# Patient Record
Sex: Male | Born: 1955 | Hispanic: No | Marital: Married | Smoking: Never smoker
Health system: Southern US, Community
[De-identification: ages and names within clinical notes are randomized; demographics above are authoritative.]

## PROBLEM LIST (undated history)

## (undated) DIAGNOSIS — G8918 Other acute postprocedural pain: Secondary | ICD-10-CM

## (undated) DIAGNOSIS — R159 Full incontinence of feces: Secondary | ICD-10-CM

## (undated) DIAGNOSIS — I10 Essential (primary) hypertension: Secondary | ICD-10-CM

## (undated) DIAGNOSIS — D509 Iron deficiency anemia, unspecified: Secondary | ICD-10-CM

## (undated) DIAGNOSIS — E785 Hyperlipidemia, unspecified: Secondary | ICD-10-CM

## (undated) DIAGNOSIS — I4891 Unspecified atrial fibrillation: Secondary | ICD-10-CM

## (undated) DIAGNOSIS — H269 Unspecified cataract: Secondary | ICD-10-CM

## (undated) DIAGNOSIS — M5136 Other intervertebral disc degeneration, lumbar region: Secondary | ICD-10-CM

## (undated) DIAGNOSIS — C20 Malignant neoplasm of rectum: Secondary | ICD-10-CM

## (undated) DIAGNOSIS — Z85048 Personal history of other malignant neoplasm of rectum, rectosigmoid junction, and anus: Secondary | ICD-10-CM

## (undated) DIAGNOSIS — M51369 Other intervertebral disc degeneration, lumbar region without mention of lumbar back pain or lower extremity pain: Secondary | ICD-10-CM

## (undated) DIAGNOSIS — E119 Type 2 diabetes mellitus without complications: Secondary | ICD-10-CM

## (undated) DIAGNOSIS — N529 Male erectile dysfunction, unspecified: Secondary | ICD-10-CM

## (undated) DIAGNOSIS — I2510 Atherosclerotic heart disease of native coronary artery without angina pectoris: Secondary | ICD-10-CM

## (undated) DIAGNOSIS — M109 Gout, unspecified: Secondary | ICD-10-CM

## (undated) DIAGNOSIS — Z8619 Personal history of other infectious and parasitic diseases: Secondary | ICD-10-CM

## (undated) HISTORY — DX: Malignant neoplasm of rectum: C20

## (undated) HISTORY — DX: Gout, unspecified: M10.9

## (undated) HISTORY — PX: COLONOSCOPY: SHX174

## (undated) HISTORY — PX: CATARACT EXTRACTION W/ INTRAOCULAR LENS IMPLANT: SHX1309

## (undated) HISTORY — PX: HERNIA REPAIR: SHX51

## (undated) HISTORY — DX: Unspecified cataract: H26.9

## (undated) HISTORY — DX: Unspecified atrial fibrillation: I48.91

## (undated) HISTORY — PX: COLONOSCOPY WITH ESOPHAGOGASTRODUODENOSCOPY (EGD): SHX5779

## (undated) HISTORY — PX: OTHER SURGICAL HISTORY: SHX169

## (undated) HISTORY — PX: ABLATION: SHX5711

## (undated) HISTORY — DX: Hyperlipidemia, unspecified: E78.5

---

## 1998-07-03 ENCOUNTER — Ambulatory Visit (HOSPITAL_COMMUNITY): Admission: RE | Admit: 1998-07-03 | Discharge: 1998-07-03 | Payer: Self-pay | Admitting: Cardiovascular Disease

## 2000-10-16 ENCOUNTER — Ambulatory Visit (HOSPITAL_COMMUNITY): Admission: RE | Admit: 2000-10-16 | Discharge: 2000-10-16 | Payer: Self-pay | Admitting: Cardiology

## 2000-10-16 ENCOUNTER — Encounter: Payer: Self-pay | Admitting: Cardiology

## 2005-07-02 ENCOUNTER — Encounter: Admission: RE | Admit: 2005-07-02 | Discharge: 2005-07-02 | Payer: Self-pay | Admitting: *Deleted

## 2005-07-03 ENCOUNTER — Encounter (INDEPENDENT_AMBULATORY_CARE_PROVIDER_SITE_OTHER): Payer: Self-pay | Admitting: Specialist

## 2005-07-03 ENCOUNTER — Ambulatory Visit (HOSPITAL_COMMUNITY): Admission: RE | Admit: 2005-07-03 | Discharge: 2005-07-03 | Payer: Self-pay | Admitting: *Deleted

## 2005-07-03 ENCOUNTER — Ambulatory Visit: Payer: Self-pay | Admitting: Hematology and Oncology

## 2005-07-15 ENCOUNTER — Ambulatory Visit: Admission: RE | Admit: 2005-07-15 | Discharge: 2005-09-11 | Payer: Self-pay | Admitting: *Deleted

## 2005-07-17 ENCOUNTER — Encounter: Admission: RE | Admit: 2005-07-17 | Discharge: 2005-07-17 | Payer: Self-pay | Admitting: Hematology and Oncology

## 2005-07-23 ENCOUNTER — Ambulatory Visit (HOSPITAL_COMMUNITY): Admission: RE | Admit: 2005-07-23 | Discharge: 2005-07-23 | Payer: Self-pay | Admitting: Hematology and Oncology

## 2005-07-23 HISTORY — PX: OTHER SURGICAL HISTORY: SHX169

## 2005-08-18 ENCOUNTER — Ambulatory Visit: Payer: Self-pay | Admitting: Hematology and Oncology

## 2005-08-22 ENCOUNTER — Emergency Department (HOSPITAL_COMMUNITY): Admission: EM | Admit: 2005-08-22 | Discharge: 2005-08-22 | Payer: Self-pay | Admitting: Emergency Medicine

## 2005-12-05 ENCOUNTER — Encounter: Admission: RE | Admit: 2005-12-05 | Discharge: 2005-12-05 | Payer: Self-pay | Admitting: Internal Medicine

## 2005-12-09 ENCOUNTER — Ambulatory Visit: Payer: Self-pay | Admitting: Hematology and Oncology

## 2006-02-04 ENCOUNTER — Ambulatory Visit: Payer: Self-pay | Admitting: Hematology and Oncology

## 2006-03-25 ENCOUNTER — Ambulatory Visit: Payer: Self-pay | Admitting: Hematology and Oncology

## 2006-04-02 LAB — CBC WITH DIFFERENTIAL/PLATELET
BASO%: 1.2 % (ref 0.0–2.0)
Basophils Absolute: 0.1 10*3/uL (ref 0.0–0.1)
EOS%: 4.7 % (ref 0.0–7.0)
Eosinophils Absolute: 0.2 10*3/uL (ref 0.0–0.5)
HCT: 39.8 % (ref 38.7–49.9)
HGB: 13 g/dL (ref 13.0–17.1)
LYMPH%: 21.2 % (ref 14.0–48.0)
MCH: 26.1 pg — ABNORMAL LOW (ref 28.0–33.4)
MCHC: 32.5 g/dL (ref 32.0–35.9)
MCV: 80.1 fL — ABNORMAL LOW (ref 81.6–98.0)
MONO#: 0.5 10*3/uL (ref 0.1–0.9)
MONO%: 11.1 % (ref 0.0–13.0)
NEUT#: 2.7 10*3/uL (ref 1.5–6.5)
NEUT%: 61.9 % (ref 40.0–75.0)
Platelets: 261 10*3/uL (ref 145–400)
RBC: 4.97 10*6/uL (ref 4.20–5.71)
RDW: 17.8 % — ABNORMAL HIGH (ref 11.2–14.6)
WBC: 4.3 10*3/uL (ref 4.0–10.0)
lymph#: 0.9 10*3/uL (ref 0.9–3.3)

## 2006-04-02 LAB — BASIC METABOLIC PANEL
BUN: 13 mg/dL (ref 6–23)
CO2: 24 mEq/L (ref 19–32)
Calcium: 8.9 mg/dL (ref 8.4–10.5)
Chloride: 103 mEq/L (ref 96–112)
Creatinine, Ser: 0.8 mg/dL (ref 0.4–1.5)
Glucose, Bld: 186 mg/dL — ABNORMAL HIGH (ref 70–99)
Potassium: 4.5 mEq/L (ref 3.5–5.3)
Sodium: 136 mEq/L (ref 135–145)

## 2006-04-03 LAB — MAGNESIUM: Magnesium: 1.7 mg/dL (ref 1.5–2.5)

## 2006-04-16 LAB — BASIC METABOLIC PANEL
BUN: 10 mg/dL (ref 6–23)
CO2: 25 mEq/L (ref 19–32)
Calcium: 9.6 mg/dL (ref 8.4–10.5)
Chloride: 104 mEq/L (ref 96–112)
Creatinine, Ser: 0.9 mg/dL (ref 0.4–1.5)
Glucose, Bld: 137 mg/dL — ABNORMAL HIGH (ref 70–99)
Potassium: 4.2 mEq/L (ref 3.5–5.3)
Sodium: 138 mEq/L (ref 135–145)

## 2006-04-16 LAB — CBC WITH DIFFERENTIAL/PLATELET
BASO%: 0.6 % (ref 0.0–2.0)
Basophils Absolute: 0 10*3/uL (ref 0.0–0.1)
EOS%: 1.8 % (ref 0.0–7.0)
Eosinophils Absolute: 0.1 10*3/uL (ref 0.0–0.5)
HCT: 41 % (ref 38.7–49.9)
HGB: 13.6 g/dL (ref 13.0–17.1)
LYMPH%: 24.4 % (ref 14.0–48.0)
MCH: 26.3 pg — ABNORMAL LOW (ref 28.0–33.4)
MCHC: 33.1 g/dL (ref 32.0–35.9)
MCV: 79.3 fL — ABNORMAL LOW (ref 81.6–98.0)
MONO#: 0.6 10*3/uL (ref 0.1–0.9)
MONO%: 11.1 % (ref 0.0–13.0)
NEUT#: 3.1 10*3/uL (ref 1.5–6.5)
NEUT%: 62.1 % (ref 40.0–75.0)
Platelets: 258 10*3/uL (ref 145–400)
RBC: 5.17 10*6/uL (ref 4.20–5.71)
RDW: 19.3 % — ABNORMAL HIGH (ref 11.2–14.6)
WBC: 5 10*3/uL (ref 4.0–10.0)
lymph#: 1.2 10*3/uL (ref 0.9–3.3)

## 2006-04-16 LAB — MAGNESIUM: Magnesium: 1.6 mg/dL (ref 1.5–2.5)

## 2006-05-11 ENCOUNTER — Ambulatory Visit: Payer: Self-pay | Admitting: Hematology and Oncology

## 2006-05-13 LAB — COMPREHENSIVE METABOLIC PANEL WITH GFR
ALT: 61 U/L — ABNORMAL HIGH (ref 0–40)
AST: 54 U/L — ABNORMAL HIGH (ref 0–37)
Albumin: 4.3 g/dL (ref 3.5–5.2)
Alkaline Phosphatase: 95 U/L (ref 39–117)
BUN: 11 mg/dL (ref 6–23)
CO2: 24 meq/L (ref 19–32)
Calcium: 9.3 mg/dL (ref 8.4–10.5)
Chloride: 102 meq/L (ref 96–112)
Creatinine, Ser: 0.9 mg/dL (ref 0.4–1.5)
Glucose, Bld: 149 mg/dL — ABNORMAL HIGH (ref 70–99)
Potassium: 4.2 meq/L (ref 3.5–5.3)
Sodium: 136 meq/L (ref 135–145)
Total Bilirubin: 0.4 mg/dL (ref 0.3–1.2)
Total Protein: 7.2 g/dL (ref 6.0–8.3)

## 2006-05-13 LAB — CBC WITH DIFFERENTIAL/PLATELET
BASO%: 0.5 % (ref 0.0–2.0)
Basophils Absolute: 0 10*3/uL (ref 0.0–0.1)
EOS%: 3.9 % (ref 0.0–7.0)
Eosinophils Absolute: 0.2 10*3/uL (ref 0.0–0.5)
HCT: 39.6 % (ref 38.7–49.9)
HGB: 13.2 g/dL (ref 13.0–17.1)
LYMPH%: 25.9 % (ref 14.0–48.0)
MCH: 26.9 pg — ABNORMAL LOW (ref 28.0–33.4)
MCHC: 33.3 g/dL (ref 32.0–35.9)
MCV: 80.8 fL — ABNORMAL LOW (ref 81.6–98.0)
MONO#: 0.5 10*3/uL (ref 0.1–0.9)
MONO%: 11.4 % (ref 0.0–13.0)
NEUT#: 2.5 10*3/uL (ref 1.5–6.5)
NEUT%: 58.3 % (ref 40.0–75.0)
Platelets: 265 10*3/uL (ref 145–400)
RBC: 4.91 10*6/uL (ref 4.20–5.71)
RDW: 17.7 % — ABNORMAL HIGH (ref 11.2–14.6)
WBC: 4.2 10*3/uL (ref 4.0–10.0)
lymph#: 1.1 10*3/uL (ref 0.9–3.3)

## 2006-05-13 LAB — MAGNESIUM: Magnesium: 1.6 mg/dL (ref 1.5–2.5)

## 2006-05-29 HISTORY — PX: COLOSTOMY TAKEDOWN: SHX5783

## 2006-06-11 ENCOUNTER — Ambulatory Visit (HOSPITAL_COMMUNITY): Admission: RE | Admit: 2006-06-11 | Discharge: 2006-06-11 | Payer: Self-pay | Admitting: Hematology and Oncology

## 2006-06-18 LAB — COMPREHENSIVE METABOLIC PANEL
ALT: 55 U/L — ABNORMAL HIGH (ref 0–40)
AST: 40 U/L — ABNORMAL HIGH (ref 0–37)
Albumin: 4.3 g/dL (ref 3.5–5.2)
Alkaline Phosphatase: 93 U/L (ref 39–117)
BUN: 12 mg/dL (ref 6–23)
CO2: 23 mEq/L (ref 19–32)
Calcium: 8.9 mg/dL (ref 8.4–10.5)
Chloride: 103 mEq/L (ref 96–112)
Creatinine, Ser: 0.89 mg/dL (ref 0.40–1.50)
Glucose, Bld: 194 mg/dL — ABNORMAL HIGH (ref 70–99)
Potassium: 4.4 mEq/L (ref 3.5–5.3)
Sodium: 136 mEq/L (ref 135–145)
Total Bilirubin: 0.6 mg/dL (ref 0.3–1.2)
Total Protein: 7 g/dL (ref 6.0–8.3)

## 2006-06-18 LAB — CBC WITH DIFFERENTIAL/PLATELET
BASO%: 0.3 % (ref 0.0–2.0)
Basophils Absolute: 0 10*3/uL (ref 0.0–0.1)
EOS%: 2.9 % (ref 0.0–7.0)
Eosinophils Absolute: 0.2 10*3/uL (ref 0.0–0.5)
HCT: 41.5 % (ref 38.7–49.9)
HGB: 13.9 g/dL (ref 13.0–17.1)
LYMPH%: 16.6 % (ref 14.0–48.0)
MCH: 26.6 pg — ABNORMAL LOW (ref 28.0–33.4)
MCHC: 33.4 g/dL (ref 32.0–35.9)
MCV: 79.7 fL — ABNORMAL LOW (ref 81.6–98.0)
MONO#: 0.4 10*3/uL (ref 0.1–0.9)
MONO%: 7.2 % (ref 0.0–13.0)
NEUT#: 4.3 10*3/uL (ref 1.5–6.5)
NEUT%: 73 % (ref 40.0–75.0)
Platelets: 255 10*3/uL (ref 145–400)
RBC: 5.21 10*6/uL (ref 4.20–5.71)
RDW: 16.3 % — ABNORMAL HIGH (ref 11.2–14.6)
WBC: 5.9 10*3/uL (ref 4.0–10.0)
lymph#: 1 10*3/uL (ref 0.9–3.3)

## 2006-06-18 LAB — CEA: CEA: 0.9 ng/mL (ref 0.0–5.0)

## 2006-07-08 ENCOUNTER — Encounter: Admission: RE | Admit: 2006-07-08 | Discharge: 2006-07-08 | Payer: Self-pay | Admitting: Internal Medicine

## 2006-10-27 ENCOUNTER — Ambulatory Visit: Payer: Self-pay | Admitting: Hematology and Oncology

## 2006-10-29 LAB — CBC WITH DIFFERENTIAL/PLATELET
BASO%: 0.4 % (ref 0.0–2.0)
Basophils Absolute: 0 10*3/uL (ref 0.0–0.1)
EOS%: 3.2 % (ref 0.0–7.0)
Eosinophils Absolute: 0.2 10*3/uL (ref 0.0–0.5)
HCT: 35 % — ABNORMAL LOW (ref 38.7–49.9)
HGB: 11.2 g/dL — ABNORMAL LOW (ref 13.0–17.1)
LYMPH%: 26.5 % (ref 14.0–48.0)
MCH: 22.6 pg — ABNORMAL LOW (ref 28.0–33.4)
MCHC: 32 g/dL (ref 32.0–35.9)
MCV: 70.6 fL — ABNORMAL LOW (ref 81.6–98.0)
MONO#: 0.4 10*3/uL (ref 0.1–0.9)
MONO%: 7.6 % (ref 0.0–13.0)
NEUT#: 3.5 10*3/uL (ref 1.5–6.5)
NEUT%: 62.3 % (ref 40.0–75.0)
Platelets: 321 10*3/uL (ref 145–400)
RBC: 4.95 10*6/uL (ref 4.20–5.71)
RDW: 17.4 % — ABNORMAL HIGH (ref 11.2–14.6)
WBC: 5.6 10*3/uL (ref 4.0–10.0)
lymph#: 1.5 10*3/uL (ref 0.9–3.3)

## 2006-10-29 LAB — COMPREHENSIVE METABOLIC PANEL
ALT: 93 U/L — ABNORMAL HIGH (ref 0–53)
AST: 59 U/L — ABNORMAL HIGH (ref 0–37)
Albumin: 4.5 g/dL (ref 3.5–5.2)
Alkaline Phosphatase: 77 U/L (ref 39–117)
BUN: 14 mg/dL (ref 6–23)
CO2: 26 mEq/L (ref 19–32)
Calcium: 9.3 mg/dL (ref 8.4–10.5)
Chloride: 103 mEq/L (ref 96–112)
Creatinine, Ser: 0.83 mg/dL (ref 0.40–1.50)
Glucose, Bld: 103 mg/dL — ABNORMAL HIGH (ref 70–99)
Potassium: 4.6 mEq/L (ref 3.5–5.3)
Sodium: 139 mEq/L (ref 135–145)
Total Bilirubin: 0.4 mg/dL (ref 0.3–1.2)
Total Protein: 7.1 g/dL (ref 6.0–8.3)

## 2006-10-29 LAB — IRON AND TIBC
%SAT: 6 % — ABNORMAL LOW (ref 20–55)
Iron: 27 ug/dL — ABNORMAL LOW (ref 42–165)
TIBC: 460 ug/dL — ABNORMAL HIGH (ref 215–435)
UIBC: 433 ug/dL

## 2006-10-29 LAB — CEA: CEA: <0.5 ng/mL (ref 0.0–5.0)

## 2006-10-29 LAB — FERRITIN: Ferritin: 20 ng/mL — ABNORMAL LOW (ref 22–322)

## 2006-10-30 ENCOUNTER — Ambulatory Visit (HOSPITAL_COMMUNITY): Admission: RE | Admit: 2006-10-30 | Discharge: 2006-10-30 | Payer: Self-pay | Admitting: Hematology and Oncology

## 2006-11-04 HISTORY — PX: OTHER SURGICAL HISTORY: SHX169

## 2007-02-11 ENCOUNTER — Ambulatory Visit: Payer: Self-pay | Admitting: Hematology and Oncology

## 2007-02-16 LAB — BASIC METABOLIC PANEL
BUN: 12 mg/dL (ref 6–23)
CO2: 27 mEq/L (ref 19–32)
Calcium: 9.3 mg/dL (ref 8.4–10.5)
Chloride: 100 mEq/L (ref 96–112)
Creatinine, Ser: 0.84 mg/dL (ref 0.40–1.50)
Glucose, Bld: 193 mg/dL — ABNORMAL HIGH (ref 70–99)
Potassium: 4.3 mEq/L (ref 3.5–5.3)
Sodium: 136 mEq/L (ref 135–145)

## 2007-02-19 ENCOUNTER — Ambulatory Visit (HOSPITAL_COMMUNITY): Admission: RE | Admit: 2007-02-19 | Discharge: 2007-02-19 | Payer: Self-pay | Admitting: Hematology and Oncology

## 2007-03-11 LAB — CBC WITH DIFFERENTIAL/PLATELET
BASO%: 0.2 % (ref 0.0–2.0)
Basophils Absolute: 0 10*3/uL (ref 0.0–0.1)
EOS%: 4.7 % (ref 0.0–7.0)
Eosinophils Absolute: 0.3 10*3/uL (ref 0.0–0.5)
HCT: 38.3 % — ABNORMAL LOW (ref 38.7–49.9)
HGB: 12.9 g/dL — ABNORMAL LOW (ref 13.0–17.1)
LYMPH%: 22 % (ref 14.0–48.0)
MCH: 25.6 pg — ABNORMAL LOW (ref 28.0–33.4)
MCHC: 33.8 g/dL (ref 32.0–35.9)
MCV: 75.8 fL — ABNORMAL LOW (ref 81.6–98.0)
MONO#: 0.4 10*3/uL (ref 0.1–0.9)
MONO%: 7.4 % (ref 0.0–13.0)
NEUT#: 3.6 10*3/uL (ref 1.5–6.5)
NEUT%: 65.7 % (ref 40.0–75.0)
Platelets: 300 10*3/uL (ref 145–400)
RBC: 5.05 10*6/uL (ref 4.20–5.71)
RDW: 16.6 % — ABNORMAL HIGH (ref 11.2–14.6)
WBC: 5.5 10*3/uL (ref 4.0–10.0)
lymph#: 1.2 10*3/uL (ref 0.9–3.3)

## 2007-03-11 LAB — CEA: CEA: 0.5 ng/mL (ref 0.0–5.0)

## 2007-03-11 LAB — COMPREHENSIVE METABOLIC PANEL
ALT: 36 U/L (ref 0–53)
AST: 19 U/L (ref 0–37)
Albumin: 4 g/dL (ref 3.5–5.2)
Alkaline Phosphatase: 82 U/L (ref 39–117)
BUN: 11 mg/dL (ref 6–23)
CO2: 23 mEq/L (ref 19–32)
Calcium: 8.8 mg/dL (ref 8.4–10.5)
Chloride: 102 mEq/L (ref 96–112)
Creatinine, Ser: 0.77 mg/dL (ref 0.40–1.50)
Glucose, Bld: 149 mg/dL — ABNORMAL HIGH (ref 70–99)
Potassium: 4.4 mEq/L (ref 3.5–5.3)
Sodium: 136 mEq/L (ref 135–145)
Total Bilirubin: 0.8 mg/dL (ref 0.3–1.2)
Total Protein: 7 g/dL (ref 6.0–8.3)

## 2007-08-05 ENCOUNTER — Ambulatory Visit: Payer: Self-pay | Admitting: Hematology and Oncology

## 2007-08-09 LAB — COMPREHENSIVE METABOLIC PANEL
ALT: 43 U/L (ref 0–53)
AST: 25 U/L (ref 0–37)
Albumin: 4.2 g/dL (ref 3.5–5.2)
Alkaline Phosphatase: 102 U/L (ref 39–117)
BUN: 15 mg/dL (ref 6–23)
CO2: 21 mEq/L (ref 19–32)
Calcium: 9.2 mg/dL (ref 8.4–10.5)
Chloride: 105 mEq/L (ref 96–112)
Creatinine, Ser: 0.84 mg/dL (ref 0.40–1.50)
Glucose, Bld: 148 mg/dL — ABNORMAL HIGH (ref 70–99)
Potassium: 4.3 mEq/L (ref 3.5–5.3)
Sodium: 139 mEq/L (ref 135–145)
Total Bilirubin: 0.4 mg/dL (ref 0.3–1.2)
Total Protein: 6.9 g/dL (ref 6.0–8.3)

## 2007-08-09 LAB — CBC WITH DIFFERENTIAL/PLATELET
BASO%: 0.7 % (ref 0.0–2.0)
Basophils Absolute: 0 10*3/uL (ref 0.0–0.1)
EOS%: 3.4 % (ref 0.0–7.0)
Eosinophils Absolute: 0.2 10*3/uL (ref 0.0–0.5)
HCT: 39.8 % (ref 38.7–49.9)
HGB: 13.7 g/dL (ref 13.0–17.1)
LYMPH%: 32.1 % (ref 14.0–48.0)
MCH: 26.5 pg — ABNORMAL LOW (ref 28.0–33.4)
MCHC: 34.5 g/dL (ref 32.0–35.9)
MCV: 76.7 fL — ABNORMAL LOW (ref 81.6–98.0)
MONO#: 0.5 10*3/uL (ref 0.1–0.9)
MONO%: 7.8 % (ref 0.0–13.0)
NEUT#: 3.3 10*3/uL (ref 1.5–6.5)
NEUT%: 56 % (ref 40.0–75.0)
Platelets: 227 10*3/uL (ref 145–400)
RBC: 5.19 10*6/uL (ref 4.20–5.71)
RDW: 15.1 % — ABNORMAL HIGH (ref 11.2–14.6)
WBC: 5.8 10*3/uL (ref 4.0–10.0)
lymph#: 1.9 10*3/uL (ref 0.9–3.3)

## 2007-08-09 LAB — CEA: CEA: <0.5 ng/mL (ref 0.0–5.0)

## 2007-08-10 ENCOUNTER — Ambulatory Visit (HOSPITAL_COMMUNITY): Admission: RE | Admit: 2007-08-10 | Discharge: 2007-08-10 | Payer: Self-pay | Admitting: Hematology and Oncology

## 2007-10-18 ENCOUNTER — Emergency Department (HOSPITAL_COMMUNITY): Admission: EM | Admit: 2007-10-18 | Discharge: 2007-10-18 | Payer: Self-pay | Admitting: Emergency Medicine

## 2008-02-15 ENCOUNTER — Ambulatory Visit: Payer: Self-pay | Admitting: Hematology and Oncology

## 2008-02-17 LAB — COMPREHENSIVE METABOLIC PANEL
ALT: 39 U/L (ref 0–53)
AST: 29 U/L (ref 0–37)
Albumin: 4.7 g/dL (ref 3.5–5.2)
Alkaline Phosphatase: 95 U/L (ref 39–117)
BUN: 12 mg/dL (ref 6–23)
CO2: 24 mEq/L (ref 19–32)
Calcium: 9.9 mg/dL (ref 8.4–10.5)
Chloride: 102 mEq/L (ref 96–112)
Creatinine, Ser: 0.78 mg/dL (ref 0.40–1.50)
Glucose, Bld: 105 mg/dL — ABNORMAL HIGH (ref 70–99)
Potassium: 4.1 mEq/L (ref 3.5–5.3)
Sodium: 139 mEq/L (ref 135–145)
Total Bilirubin: 0.5 mg/dL (ref 0.3–1.2)
Total Protein: 7.6 g/dL (ref 6.0–8.3)

## 2008-02-17 LAB — CEA: CEA: <0.5 ng/mL (ref 0.0–5.0)

## 2008-02-17 LAB — CBC WITH DIFFERENTIAL/PLATELET
BASO%: 0.3 % (ref 0.0–2.0)
Basophils Absolute: 0 10*3/uL (ref 0.0–0.1)
EOS%: 3.7 % (ref 0.0–7.0)
Eosinophils Absolute: 0.3 10*3/uL (ref 0.0–0.5)
HCT: 45.4 % (ref 38.7–49.9)
HGB: 15.2 g/dL (ref 13.0–17.1)
LYMPH%: 28 % (ref 14.0–48.0)
MCH: 25.8 pg — ABNORMAL LOW (ref 28.0–33.4)
MCHC: 33.5 g/dL (ref 32.0–35.9)
MCV: 76.9 fL — ABNORMAL LOW (ref 81.6–98.0)
MONO#: 0.4 10*3/uL (ref 0.1–0.9)
MONO%: 5.8 % (ref 0.0–13.0)
NEUT#: 4.2 10*3/uL (ref 1.5–6.5)
NEUT%: 62.2 % (ref 40.0–75.0)
Platelets: 227 10*3/uL (ref 145–400)
RBC: 5.91 10*6/uL — ABNORMAL HIGH (ref 4.20–5.71)
RDW: 15.7 % — ABNORMAL HIGH (ref 11.2–14.6)
WBC: 6.8 10*3/uL (ref 4.0–10.0)
lymph#: 1.9 10*3/uL (ref 0.9–3.3)

## 2008-02-18 ENCOUNTER — Ambulatory Visit (HOSPITAL_COMMUNITY): Admission: RE | Admit: 2008-02-18 | Discharge: 2008-02-18 | Payer: Self-pay | Admitting: Hematology and Oncology

## 2008-04-19 ENCOUNTER — Ambulatory Visit: Payer: Self-pay | Admitting: Hematology and Oncology

## 2008-04-20 LAB — COMPREHENSIVE METABOLIC PANEL
ALT: 29 U/L (ref 0–53)
AST: 26 U/L (ref 0–37)
Albumin: 4.3 g/dL (ref 3.5–5.2)
Alkaline Phosphatase: 80 U/L (ref 39–117)
BUN: 12 mg/dL (ref 6–23)
CO2: 21 mEq/L (ref 19–32)
Calcium: 9.1 mg/dL (ref 8.4–10.5)
Chloride: 102 mEq/L (ref 96–112)
Creatinine, Ser: 0.8 mg/dL (ref 0.40–1.50)
Glucose, Bld: 150 mg/dL — ABNORMAL HIGH (ref 70–99)
Potassium: 4.1 mEq/L (ref 3.5–5.3)
Sodium: 137 mEq/L (ref 135–145)
Total Bilirubin: 0.5 mg/dL (ref 0.3–1.2)
Total Protein: 7.2 g/dL (ref 6.0–8.3)

## 2008-04-20 LAB — CBC WITH DIFFERENTIAL/PLATELET
BASO%: 0.8 % (ref 0.0–2.0)
Basophils Absolute: 0 10*3/uL (ref 0.0–0.1)
EOS%: 5.4 % (ref 0.0–7.0)
Eosinophils Absolute: 0.3 10*3/uL (ref 0.0–0.5)
HCT: 40.7 % (ref 38.7–49.9)
HGB: 13.9 g/dL (ref 13.0–17.1)
LYMPH%: 29 % (ref 14.0–48.0)
MCH: 26.4 pg — ABNORMAL LOW (ref 28.0–33.4)
MCHC: 34 g/dL (ref 32.0–35.9)
MCV: 77.4 fL — ABNORMAL LOW (ref 81.6–98.0)
MONO#: 0.3 10*3/uL (ref 0.1–0.9)
MONO%: 6.1 % (ref 0.0–13.0)
NEUT#: 3 10*3/uL (ref 1.5–6.5)
NEUT%: 58.7 % (ref 40.0–75.0)
Platelets: 228 10*3/uL (ref 145–400)
RBC: 5.26 10*6/uL (ref 4.20–5.71)
RDW: 15.2 % — ABNORMAL HIGH (ref 11.2–14.6)
WBC: 5.1 10*3/uL (ref 4.0–10.0)
lymph#: 1.5 10*3/uL (ref 0.9–3.3)

## 2008-04-20 LAB — CEA: CEA: <0.5 ng/mL (ref 0.0–5.0)

## 2008-09-01 ENCOUNTER — Encounter: Admission: RE | Admit: 2008-09-01 | Discharge: 2008-09-01 | Payer: Self-pay | Admitting: Interventional Cardiology

## 2008-10-18 ENCOUNTER — Ambulatory Visit: Payer: Self-pay | Admitting: Hematology and Oncology

## 2008-10-20 LAB — COMPREHENSIVE METABOLIC PANEL
ALT: 33 U/L (ref 0–53)
AST: 27 U/L (ref 0–37)
Albumin: 4.3 g/dL (ref 3.5–5.2)
Alkaline Phosphatase: 85 U/L (ref 39–117)
BUN: 13 mg/dL (ref 6–23)
CO2: 19 mEq/L (ref 19–32)
Calcium: 9.3 mg/dL (ref 8.4–10.5)
Chloride: 104 mEq/L (ref 96–112)
Creatinine, Ser: 0.87 mg/dL (ref 0.40–1.50)
Glucose, Bld: 177 mg/dL — ABNORMAL HIGH (ref 70–99)
Potassium: 4.2 mEq/L (ref 3.5–5.3)
Sodium: 136 mEq/L (ref 135–145)
Total Bilirubin: 0.5 mg/dL (ref 0.3–1.2)
Total Protein: 7.2 g/dL (ref 6.0–8.3)

## 2008-10-20 LAB — CBC WITH DIFFERENTIAL/PLATELET
BASO%: 0.4 % (ref 0.0–2.0)
Basophils Absolute: 0 10*3/uL (ref 0.0–0.1)
EOS%: 4 % (ref 0.0–7.0)
Eosinophils Absolute: 0.2 10*3/uL (ref 0.0–0.5)
HCT: 40.1 % (ref 38.7–49.9)
HGB: 13.3 g/dL (ref 13.0–17.1)
LYMPH%: 25.1 % (ref 14.0–48.0)
MCH: 26.1 pg — ABNORMAL LOW (ref 28.0–33.4)
MCHC: 33.1 g/dL (ref 32.0–35.9)
MCV: 78.8 fL — ABNORMAL LOW (ref 81.6–98.0)
MONO#: 0.3 10*3/uL (ref 0.1–0.9)
MONO%: 5.4 % (ref 0.0–13.0)
NEUT#: 4 10*3/uL (ref 1.5–6.5)
NEUT%: 65.1 % (ref 40.0–75.0)
Platelets: 235 10*3/uL (ref 145–400)
RBC: 5.08 10*6/uL (ref 4.20–5.71)
RDW: 14.4 % (ref 11.2–14.6)
WBC: 6.1 10*3/uL (ref 4.0–10.0)
lymph#: 1.5 10*3/uL (ref 0.9–3.3)

## 2008-10-20 LAB — IRON AND TIBC
%SAT: 19 % — ABNORMAL LOW (ref 20–55)
Iron: 84 ug/dL (ref 42–165)
TIBC: 434 ug/dL (ref 215–435)
UIBC: 350 ug/dL

## 2008-10-20 LAB — CEA: CEA: 0.8 ng/mL (ref 0.0–5.0)

## 2008-10-20 LAB — FERRITIN: Ferritin: 19 ng/mL — ABNORMAL LOW (ref 22–322)

## 2008-10-25 ENCOUNTER — Encounter: Admission: RE | Admit: 2008-10-25 | Discharge: 2008-10-25 | Payer: Self-pay | Admitting: Hematology and Oncology

## 2009-04-18 ENCOUNTER — Ambulatory Visit: Payer: Self-pay | Admitting: Hematology and Oncology

## 2009-04-20 LAB — COMPREHENSIVE METABOLIC PANEL WITH GFR
ALT: 36 U/L (ref 0–53)
AST: 28 U/L (ref 0–37)
Albumin: 4.4 g/dL (ref 3.5–5.2)
Alkaline Phosphatase: 93 U/L (ref 39–117)
BUN: 13 mg/dL (ref 6–23)
CO2: 23 meq/L (ref 19–32)
Calcium: 9.2 mg/dL (ref 8.4–10.5)
Chloride: 100 meq/L (ref 96–112)
Creatinine, Ser: 0.83 mg/dL (ref 0.40–1.50)
Glucose, Bld: 158 mg/dL — ABNORMAL HIGH (ref 70–99)
Potassium: 4.2 meq/L (ref 3.5–5.3)
Sodium: 133 meq/L — ABNORMAL LOW (ref 135–145)
Total Bilirubin: 0.4 mg/dL (ref 0.3–1.2)
Total Protein: 7.6 g/dL (ref 6.0–8.3)

## 2009-04-20 LAB — CBC WITH DIFFERENTIAL/PLATELET
BASO%: 0.5 % (ref 0.0–2.0)
Basophils Absolute: 0 10*3/uL (ref 0.0–0.1)
EOS%: 4.8 % (ref 0.0–7.0)
Eosinophils Absolute: 0.3 10*3/uL (ref 0.0–0.5)
HCT: 39.3 % (ref 38.4–49.9)
HGB: 13 g/dL (ref 13.0–17.1)
LYMPH%: 32.5 % (ref 14.0–49.0)
MCH: 24 pg — ABNORMAL LOW (ref 27.2–33.4)
MCHC: 33 g/dL (ref 32.0–36.0)
MCV: 72.6 fL — ABNORMAL LOW (ref 79.3–98.0)
MONO#: 0.3 10*3/uL (ref 0.1–0.9)
MONO%: 5.8 % (ref 0.0–14.0)
NEUT#: 3.4 10*3/uL (ref 1.5–6.5)
NEUT%: 56.4 % (ref 39.0–75.0)
Platelets: 252 10*3/uL (ref 140–400)
RBC: 5.41 10*6/uL (ref 4.20–5.82)
RDW: 16.1 % — ABNORMAL HIGH (ref 11.0–14.6)
WBC: 6 10*3/uL (ref 4.0–10.3)
lymph#: 2 10*3/uL (ref 0.9–3.3)

## 2009-04-20 LAB — CEA: CEA: 0.9 ng/mL (ref 0.0–5.0)

## 2009-04-23 ENCOUNTER — Encounter: Admission: RE | Admit: 2009-04-23 | Discharge: 2009-04-23 | Payer: Self-pay | Admitting: Hematology and Oncology

## 2009-10-29 ENCOUNTER — Ambulatory Visit (HOSPITAL_BASED_OUTPATIENT_CLINIC_OR_DEPARTMENT_OTHER): Admission: RE | Admit: 2009-10-29 | Discharge: 2009-10-29 | Payer: Self-pay | Admitting: Urology

## 2009-10-29 HISTORY — PX: PENILE PROSTHESIS PLACEMENT: SHX739

## 2009-11-23 ENCOUNTER — Ambulatory Visit: Payer: Self-pay | Admitting: Hematology and Oncology

## 2009-11-27 LAB — CBC WITH DIFFERENTIAL/PLATELET
BASO%: 0.9 % (ref 0.0–2.0)
Basophils Absolute: 0.1 10*3/uL (ref 0.0–0.1)
EOS%: 5 % (ref 0.0–7.0)
Eosinophils Absolute: 0.4 10*3/uL (ref 0.0–0.5)
HCT: 34.8 % — ABNORMAL LOW (ref 38.4–49.9)
HGB: 11.4 g/dL — ABNORMAL LOW (ref 13.0–17.1)
LYMPH%: 27.9 % (ref 14.0–49.0)
MCH: 24.2 pg — ABNORMAL LOW (ref 27.2–33.4)
MCHC: 32.7 g/dL (ref 32.0–36.0)
MCV: 74 fL — ABNORMAL LOW (ref 79.3–98.0)
MONO#: 0.4 10*3/uL (ref 0.1–0.9)
MONO%: 5.7 % (ref 0.0–14.0)
NEUT#: 4.7 10*3/uL (ref 1.5–6.5)
NEUT%: 60.5 % (ref 39.0–75.0)
Platelets: 361 10*3/uL (ref 140–400)
RBC: 4.71 10*6/uL (ref 4.20–5.82)
RDW: 15.9 % — ABNORMAL HIGH (ref 11.0–14.6)
WBC: 7.7 10*3/uL (ref 4.0–10.3)
lymph#: 2.2 10*3/uL (ref 0.9–3.3)

## 2009-11-27 LAB — FERRITIN: Ferritin: 33 ng/mL (ref 22–322)

## 2009-12-03 LAB — FECAL OCCULT BLOOD, GUAIAC: Occult Blood: NEGATIVE

## 2009-12-05 LAB — CBC WITH DIFFERENTIAL/PLATELET
BASO%: 0.5 % (ref 0.0–2.0)
Basophils Absolute: 0 10*3/uL (ref 0.0–0.1)
EOS%: 4.7 % (ref 0.0–7.0)
Eosinophils Absolute: 0.4 10*3/uL (ref 0.0–0.5)
HCT: 37.4 % — ABNORMAL LOW (ref 38.4–49.9)
HGB: 12 g/dL — ABNORMAL LOW (ref 13.0–17.1)
LYMPH%: 22.7 % (ref 14.0–49.0)
MCH: 23.8 pg — ABNORMAL LOW (ref 27.2–33.4)
MCHC: 32.1 g/dL (ref 32.0–36.0)
MCV: 74.2 fL — ABNORMAL LOW (ref 79.3–98.0)
MONO#: 0.4 10*3/uL (ref 0.1–0.9)
MONO%: 5 % (ref 0.0–14.0)
NEUT#: 5.3 10*3/uL (ref 1.5–6.5)
NEUT%: 67.1 % (ref 39.0–75.0)
Platelets: 349 10*3/uL (ref 140–400)
RBC: 5.04 10*6/uL (ref 4.20–5.82)
RDW: 15.9 % — ABNORMAL HIGH (ref 11.0–14.6)
WBC: 7.8 10*3/uL (ref 4.0–10.3)
lymph#: 1.8 10*3/uL (ref 0.9–3.3)

## 2009-12-05 LAB — IRON AND TIBC
%SAT: 12 % — ABNORMAL LOW (ref 20–55)
Iron: 54 ug/dL (ref 42–165)
TIBC: 451 ug/dL — ABNORMAL HIGH (ref 215–435)
UIBC: 397 ug/dL

## 2009-12-05 LAB — VITAMIN B12: Vitamin B-12: 673 pg/mL (ref 211–911)

## 2009-12-05 LAB — FERRITIN: Ferritin: 39 ng/mL (ref 22–322)

## 2010-01-21 ENCOUNTER — Ambulatory Visit: Payer: Self-pay | Admitting: Hematology and Oncology

## 2010-01-21 LAB — IRON AND TIBC
%SAT: 29 % (ref 20–55)
Iron: 137 ug/dL (ref 42–165)
TIBC: 475 ug/dL — ABNORMAL HIGH (ref 215–435)
UIBC: 338 ug/dL

## 2010-01-21 LAB — COMPREHENSIVE METABOLIC PANEL
ALT: 34 U/L (ref 0–53)
AST: 30 U/L (ref 0–37)
Albumin: 3.9 g/dL (ref 3.5–5.2)
Alkaline Phosphatase: 88 U/L (ref 39–117)
BUN: 9 mg/dL (ref 6–23)
CO2: 27 mEq/L (ref 19–32)
Calcium: 9.2 mg/dL (ref 8.4–10.5)
Chloride: 100 mEq/L (ref 96–112)
Creatinine, Ser: 0.78 mg/dL (ref 0.40–1.50)
Glucose, Bld: 131 mg/dL — ABNORMAL HIGH (ref 70–99)
Potassium: 4.2 mEq/L (ref 3.5–5.3)
Sodium: 134 mEq/L — ABNORMAL LOW (ref 135–145)
Total Bilirubin: 0.6 mg/dL (ref 0.3–1.2)
Total Protein: 7.4 g/dL (ref 6.0–8.3)

## 2010-01-21 LAB — CEA: CEA: 0.6 ng/mL (ref 0.0–5.0)

## 2010-01-21 LAB — CBC WITH DIFFERENTIAL/PLATELET
BASO%: 0.7 % (ref 0.0–2.0)
Basophils Absolute: 0 10*3/uL (ref 0.0–0.1)
EOS%: 5.9 % (ref 0.0–7.0)
Eosinophils Absolute: 0.3 10*3/uL (ref 0.0–0.5)
HCT: 39.1 % (ref 38.4–49.9)
HGB: 12.6 g/dL — ABNORMAL LOW (ref 13.0–17.1)
LYMPH%: 31.8 % (ref 14.0–49.0)
MCH: 24.2 pg — ABNORMAL LOW (ref 27.2–33.4)
MCHC: 32.3 g/dL (ref 32.0–36.0)
MCV: 74.8 fL — ABNORMAL LOW (ref 79.3–98.0)
MONO#: 0.4 10*3/uL (ref 0.1–0.9)
MONO%: 7.3 % (ref 0.0–14.0)
NEUT#: 3.2 10*3/uL (ref 1.5–6.5)
NEUT%: 54.3 % (ref 39.0–75.0)
Platelets: 272 10*3/uL (ref 140–400)
RBC: 5.22 10*6/uL (ref 4.20–5.82)
RDW: 18.6 % — ABNORMAL HIGH (ref 11.0–14.6)
WBC: 5.9 10*3/uL (ref 4.0–10.3)
lymph#: 1.9 10*3/uL (ref 0.9–3.3)

## 2010-01-21 LAB — FERRITIN: Ferritin: 19 ng/mL — ABNORMAL LOW (ref 22–322)

## 2010-01-22 ENCOUNTER — Ambulatory Visit (HOSPITAL_COMMUNITY): Admission: RE | Admit: 2010-01-22 | Discharge: 2010-01-22 | Payer: Self-pay | Admitting: Hematology and Oncology

## 2010-01-30 IMAGING — CT CT CHEST W/ CM
2 of 6 series · 16 of 46 positions shown, 18 images · IV contrast (agent unspecified)
Comparison: 04/23/2010

CT CHEST

CLINICAL DATA: Rectal cancer.

CT CHEST, ABDOMEN AND PELVIS WITH CONTRAST
TECHNIQUE: Multidetector CT imaging of the chest, abdomen and
pelvis was performed following the standard protocol during bolus
administration of intravenous contrast.
Contrast: 100 ml of Mmnipaque-GMM

[Series 2: cap with st · axial · 0.73mm/px · z∈[-664,-89]mm · 13 of 135 slices shown, 15 images]
[im 10/135  soft-tissue]
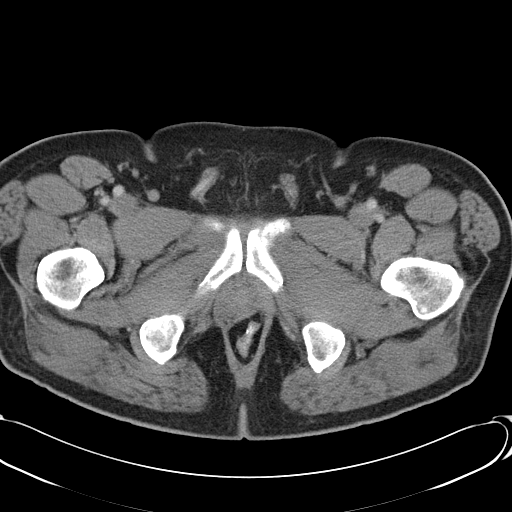
[im 10/135  bone]
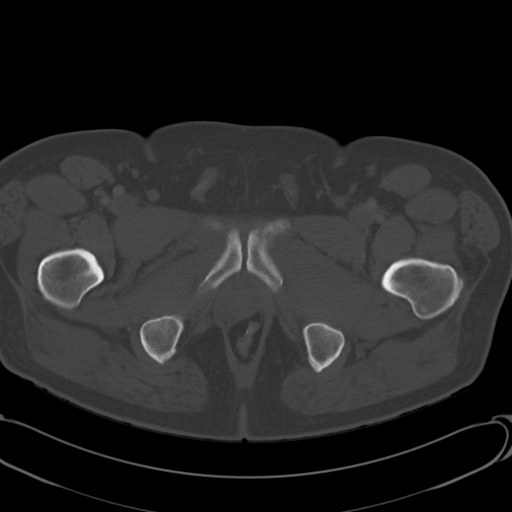
[im 20/135  soft-tissue]
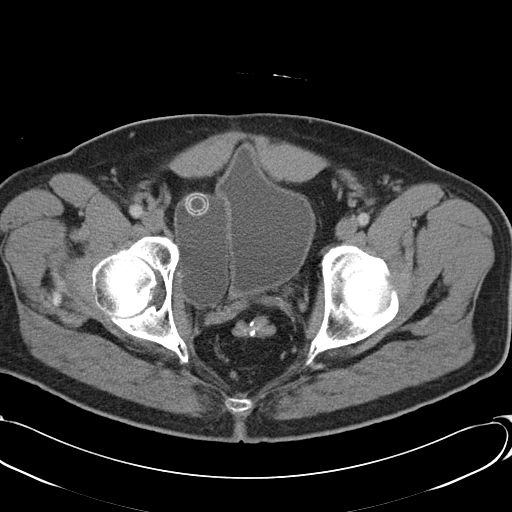
[im 29/135  soft-tissue]
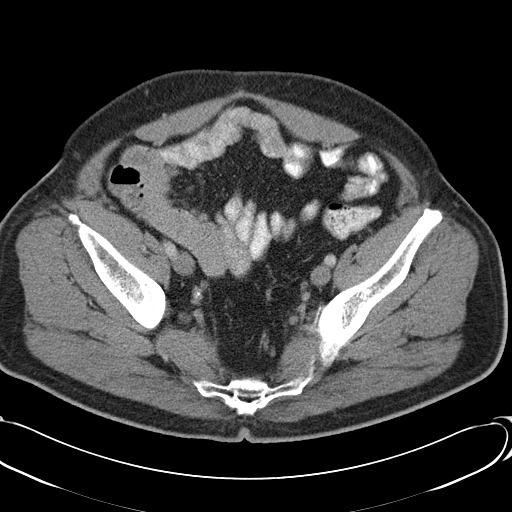
[im 39/135  soft-tissue]
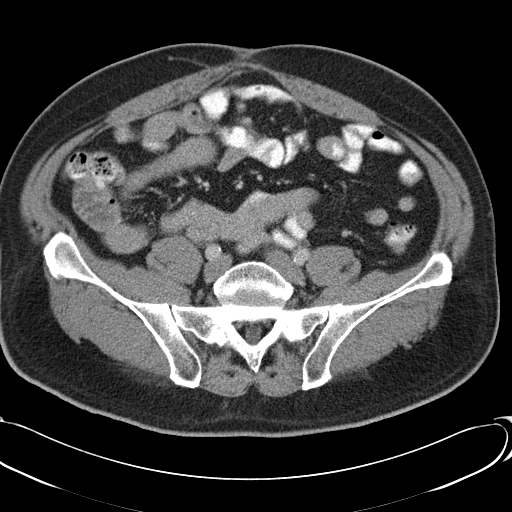
[im 48/135  soft-tissue]
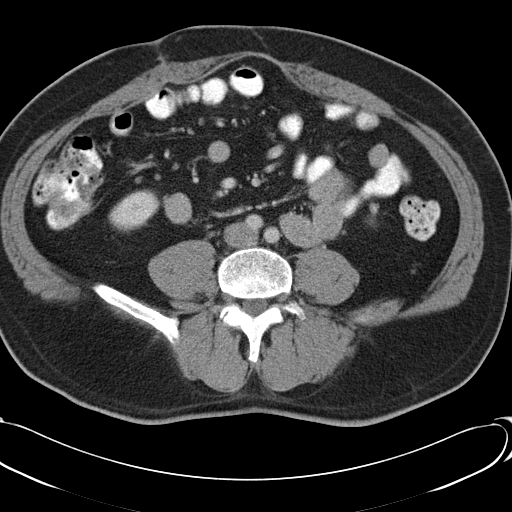
[im 58/135  soft-tissue]
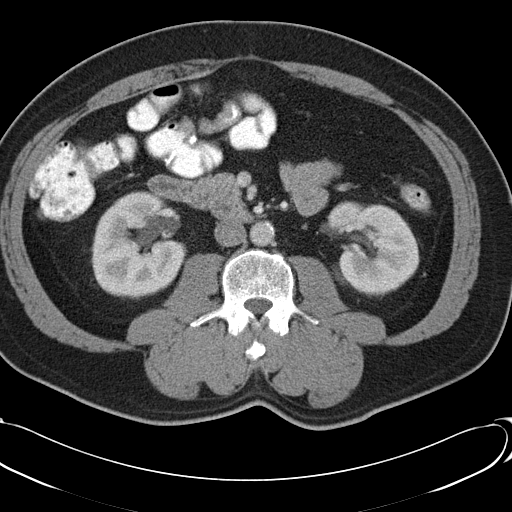
[im 68/135  soft-tissue]
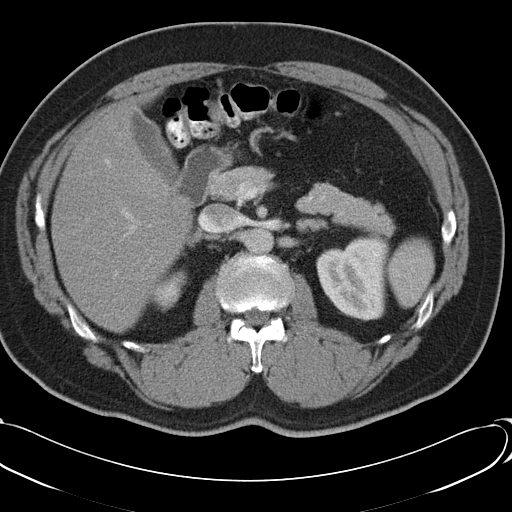
[im 77/135  soft-tissue]
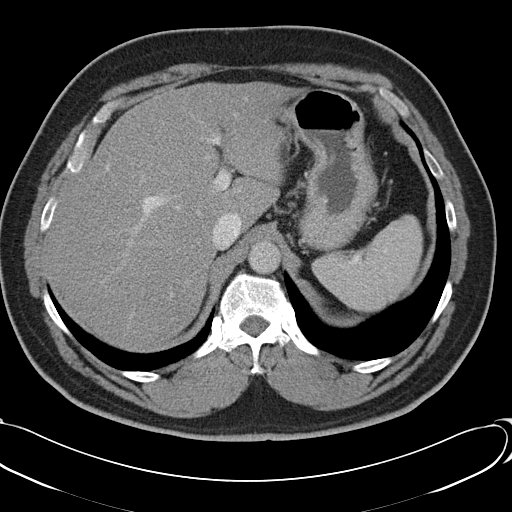
[im 87/135  soft-tissue]
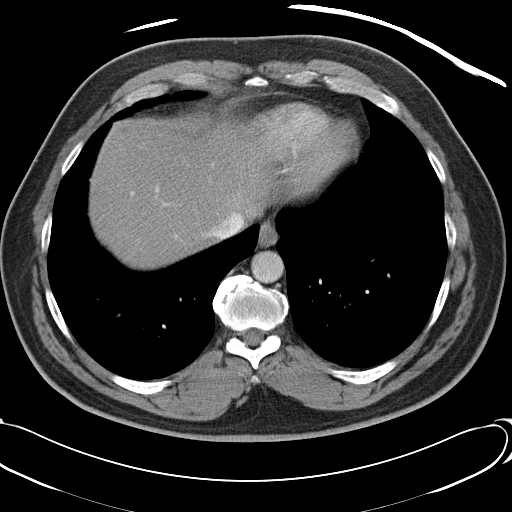
[im 87/135  bone]
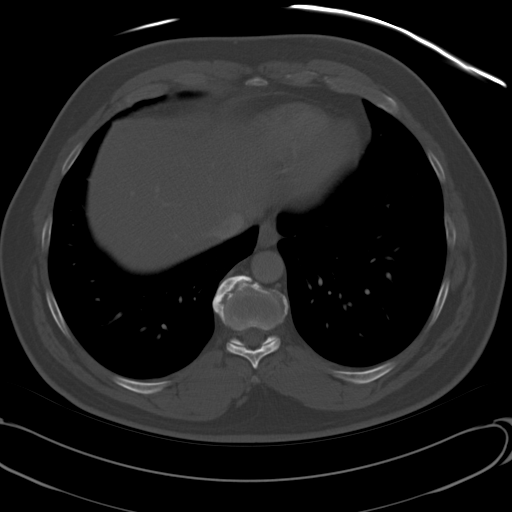
[im 96/135  soft-tissue]
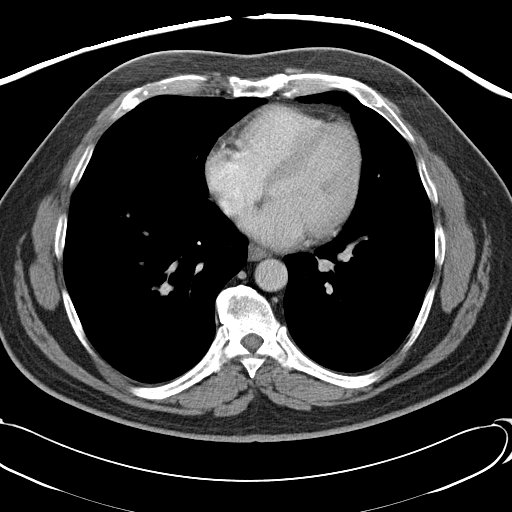
[im 106/135  soft-tissue]
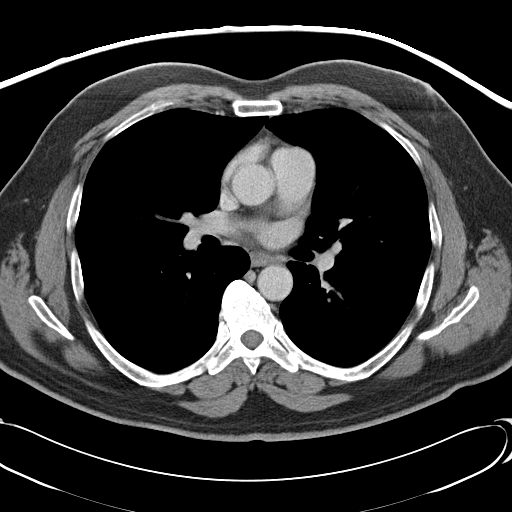
[im 115/135  soft-tissue]
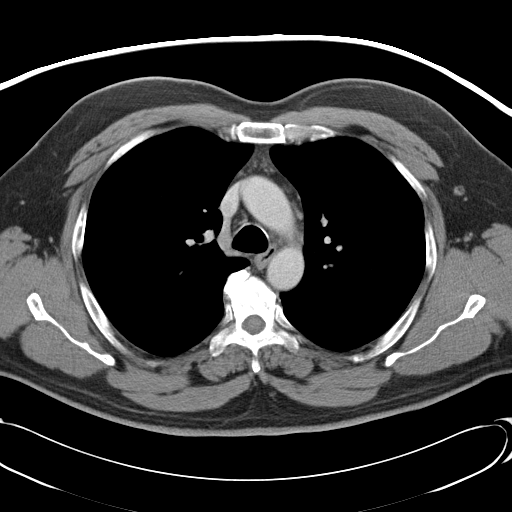
[im 125/135  soft-tissue]
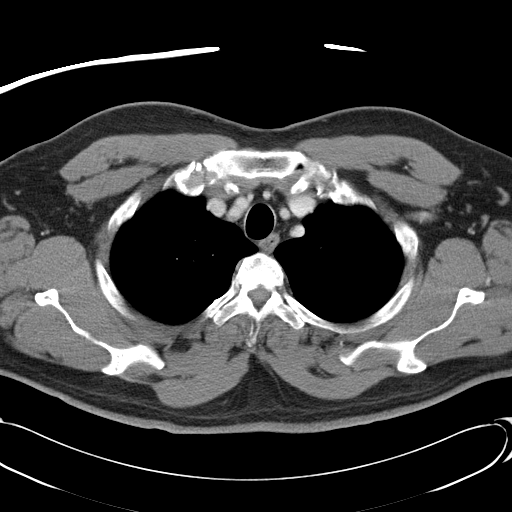

[Series 602: coronal · coronal · 1.32mm/px · 3 of 87 slices shown]
[im 29/87  soft-tissue]
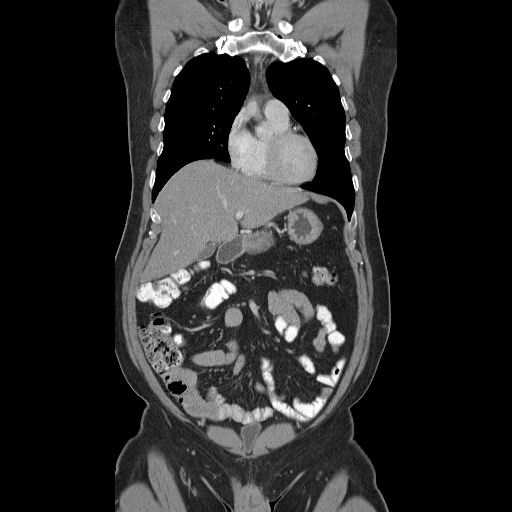
[im 39/87  soft-tissue]
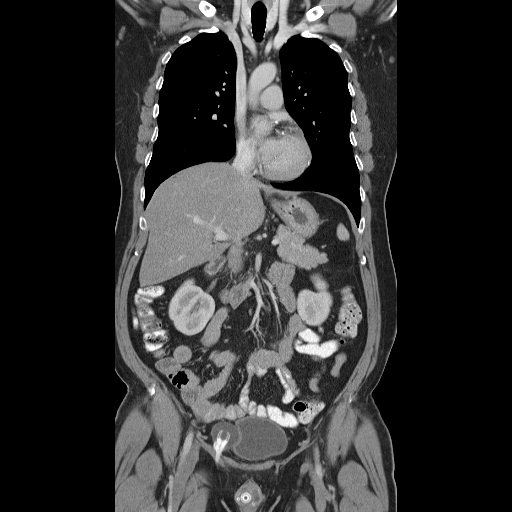
[im 48/87  soft-tissue]
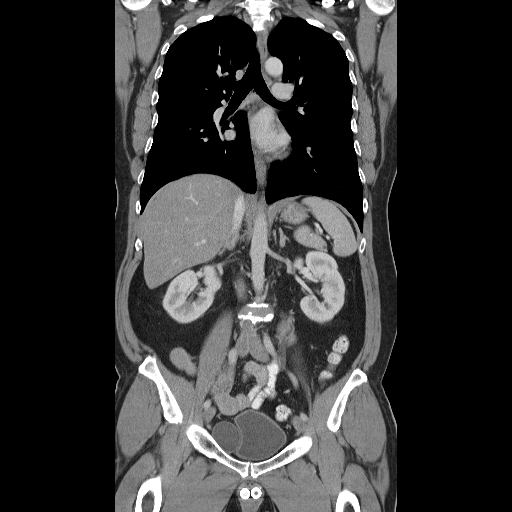

[16 of 46 positions shown; findings below may reference images not displayed]

FINDINGS: The chest wall is unremarkable and stable.  No
supraclavicular or axillary adenopathy.  There are small scattered
mediastinal and hilar lymph nodes but no adenopathy.  The heart is
normal in size.  No pericardial effusion.  The aorta is normal in
caliber.  No dissection.  Coronary artery calcifications are again
demonstrated.

Examination of the lung parenchyma demonstrates no acute pulmonary
findings.  No metastatic pulmonary nodules or mass lesions.  No
pleural effusions.
IMPRESSION: Unremarkable and stable CT appearance of the chest.  No findings
for metastatic disease.

CT ABDOMEN AND PELVIS
FINDINGS: Solid abdominal organs:There is diffuse fatty infiltration of the
liver but no focal hepatic lesions to suggest metastatic disease.
No biliary dilatation.  The gallbladder appears normal.  The spleen
is normal in size.  The pancreas, adrenal glands and kidneys are
unremarkable and stable.

GI tract:The stomach, duodenum, small bowel and colon demonstrate
no significant findings.  Rectal anastomoses sutures are again
demonstrated.  No findings for recurrent tumor.  No mesenteric mass
or adenopathy.  There are a few small scattered nodes which are
stable.

Aorta/retroperitoneal:The aorta is normal in caliber.  Mild
atherosclerotic changes.  The major branch vessels are normal.  No
retroperitoneal mass or adenopathy.

Pelvis:Since the prior examination the patient has had a penile
implant with a reservoir noted next to the bladder.  The prostate
gland and seminal vesicles appear normal.  No pelvic mass or
adenopathy.  No inguinal mass or hernia.

Bony structures:No significant findings.
IMPRESSION: 1.  Diffuse fatty infiltration of the liver but no findings for
hepatic metastatic disease.
2.  No mesenteric or retroperitoneal mass or adenopathy.
3.  Anastomoses sutures noted in the rectal area.  No findings for
recurrent tumor.

## 2010-03-03 ENCOUNTER — Emergency Department (HOSPITAL_COMMUNITY): Admission: EM | Admit: 2010-03-03 | Discharge: 2010-03-03 | Payer: Self-pay | Admitting: Emergency Medicine

## 2010-03-07 ENCOUNTER — Ambulatory Visit (HOSPITAL_COMMUNITY): Admission: RE | Admit: 2010-03-07 | Discharge: 2010-03-07 | Payer: Self-pay | Admitting: Gastroenterology

## 2010-04-08 ENCOUNTER — Ambulatory Visit: Payer: Self-pay | Admitting: Hematology and Oncology

## 2010-04-11 LAB — CBC WITH DIFFERENTIAL/PLATELET
BASO%: 0.7 % (ref 0.0–2.0)
Basophils Absolute: 0 10*3/uL (ref 0.0–0.1)
EOS%: 4.7 % (ref 0.0–7.0)
Eosinophils Absolute: 0.3 10*3/uL (ref 0.0–0.5)
HCT: 40.7 % (ref 38.4–49.9)
HGB: 13.6 g/dL (ref 13.0–17.1)
LYMPH%: 32 % (ref 14.0–49.0)
MCH: 26.7 pg — ABNORMAL LOW (ref 27.2–33.4)
MCHC: 33.5 g/dL (ref 32.0–36.0)
MCV: 79.5 fL (ref 79.3–98.0)
MONO#: 0.4 10*3/uL (ref 0.1–0.9)
MONO%: 6.4 % (ref 0.0–14.0)
NEUT#: 3.2 10*3/uL (ref 1.5–6.5)
NEUT%: 56.2 % (ref 39.0–75.0)
Platelets: 264 10*3/uL (ref 140–400)
RBC: 5.11 10*6/uL (ref 4.20–5.82)
RDW: 18.3 % — ABNORMAL HIGH (ref 11.0–14.6)
WBC: 5.7 10*3/uL (ref 4.0–10.3)
lymph#: 1.8 10*3/uL (ref 0.9–3.3)

## 2010-04-11 LAB — IRON AND TIBC
%SAT: 21 % (ref 20–55)
Iron: 89 ug/dL (ref 42–165)
TIBC: 434 ug/dL (ref 215–435)
UIBC: 345 ug/dL

## 2010-04-11 LAB — FERRITIN: Ferritin: 544 ng/mL — ABNORMAL HIGH (ref 22–322)

## 2010-07-11 ENCOUNTER — Ambulatory Visit: Payer: Self-pay | Admitting: Hematology and Oncology

## 2010-07-11 LAB — IRON AND TIBC
%SAT: 15 % — ABNORMAL LOW (ref 20–55)
Iron: 66 ug/dL (ref 42–165)
TIBC: 445 ug/dL — ABNORMAL HIGH (ref 215–435)
UIBC: 379 ug/dL

## 2010-07-11 LAB — COMPREHENSIVE METABOLIC PANEL
ALT: 34 U/L (ref 0–53)
AST: 30 U/L (ref 0–37)
Albumin: 4.5 g/dL (ref 3.5–5.2)
Alkaline Phosphatase: 52 U/L (ref 39–117)
BUN: 16 mg/dL (ref 6–23)
CO2: 19 mEq/L (ref 19–32)
Calcium: 9.4 mg/dL (ref 8.4–10.5)
Chloride: 104 mEq/L (ref 96–112)
Creatinine, Ser: 1.1 mg/dL (ref 0.40–1.50)
Glucose, Bld: 156 mg/dL — ABNORMAL HIGH (ref 70–99)
Potassium: 4.6 mEq/L (ref 3.5–5.3)
Sodium: 137 mEq/L (ref 135–145)
Total Bilirubin: 0.4 mg/dL (ref 0.3–1.2)
Total Protein: 7 g/dL (ref 6.0–8.3)

## 2010-07-11 LAB — VITAMIN B12: Vitamin B-12: 411 pg/mL (ref 211–911)

## 2010-07-11 LAB — CBC WITH DIFFERENTIAL/PLATELET
BASO%: 0.4 % (ref 0.0–2.0)
Basophils Absolute: 0 10*3/uL (ref 0.0–0.1)
EOS%: 5 % (ref 0.0–7.0)
Eosinophils Absolute: 0.3 10*3/uL (ref 0.0–0.5)
HCT: 38.3 % — ABNORMAL LOW (ref 38.4–49.9)
HGB: 12.8 g/dL — ABNORMAL LOW (ref 13.0–17.1)
LYMPH%: 31.2 % (ref 14.0–49.0)
MCH: 27.4 pg (ref 27.2–33.4)
MCHC: 33.5 g/dL (ref 32.0–36.0)
MCV: 81.7 fL (ref 79.3–98.0)
MONO#: 0.3 10*3/uL (ref 0.1–0.9)
MONO%: 6.4 % (ref 0.0–14.0)
NEUT#: 3.1 10*3/uL (ref 1.5–6.5)
NEUT%: 57 % (ref 39.0–75.0)
Platelets: 273 10*3/uL (ref 140–400)
RBC: 4.68 10*6/uL (ref 4.20–5.82)
RDW: 13.9 % (ref 11.0–14.6)
WBC: 5.4 10*3/uL (ref 4.0–10.3)
lymph#: 1.7 10*3/uL (ref 0.9–3.3)

## 2010-07-11 LAB — CEA: CEA: 1.1 ng/mL (ref 0.0–5.0)

## 2010-07-11 LAB — FERRITIN: Ferritin: 237 ng/mL (ref 22–322)

## 2010-07-12 ENCOUNTER — Ambulatory Visit (HOSPITAL_COMMUNITY): Admission: RE | Admit: 2010-07-12 | Discharge: 2010-07-12 | Payer: Self-pay | Admitting: Hematology and Oncology

## 2010-07-12 ENCOUNTER — Ambulatory Visit: Payer: Self-pay | Admitting: Hematology and Oncology

## 2010-09-18 ENCOUNTER — Ambulatory Visit: Payer: Self-pay | Admitting: Hematology and Oncology

## 2010-09-18 LAB — BASIC METABOLIC PANEL
BUN: 13 mg/dL (ref 6–23)
CO2: 23 mEq/L (ref 19–32)
Calcium: 9.6 mg/dL (ref 8.4–10.5)
Chloride: 105 mEq/L (ref 96–112)
Creatinine, Ser: 0.88 mg/dL (ref 0.40–1.50)
Glucose, Bld: 145 mg/dL — ABNORMAL HIGH (ref 70–99)
Potassium: 4.4 mEq/L (ref 3.5–5.3)
Sodium: 138 mEq/L (ref 135–145)

## 2010-09-18 LAB — CBC WITH DIFFERENTIAL/PLATELET
BASO%: 0.7 % (ref 0.0–2.0)
Basophils Absolute: 0 10*3/uL (ref 0.0–0.1)
EOS%: 5.4 % (ref 0.0–7.0)
Eosinophils Absolute: 0.3 10*3/uL (ref 0.0–0.5)
HCT: 37.8 % — ABNORMAL LOW (ref 38.4–49.9)
HGB: 12.4 g/dL — ABNORMAL LOW (ref 13.0–17.1)
LYMPH%: 33.4 % (ref 14.0–49.0)
MCH: 25.9 pg — ABNORMAL LOW (ref 27.2–33.4)
MCHC: 32.8 g/dL (ref 32.0–36.0)
MCV: 79 fL — ABNORMAL LOW (ref 79.3–98.0)
MONO#: 0.4 10*3/uL (ref 0.1–0.9)
MONO%: 7.4 % (ref 0.0–14.0)
NEUT#: 2.7 10*3/uL (ref 1.5–6.5)
NEUT%: 53.1 % (ref 39.0–75.0)
Platelets: 249 10*3/uL (ref 140–400)
RBC: 4.78 10*6/uL (ref 4.20–5.82)
RDW: 13.8 % (ref 11.0–14.6)
WBC: 5.2 10*3/uL (ref 4.0–10.3)
lymph#: 1.7 10*3/uL (ref 0.9–3.3)

## 2010-09-18 LAB — IRON AND TIBC
%SAT: 11 % — ABNORMAL LOW (ref 20–55)
Iron: 43 ug/dL (ref 42–165)
TIBC: 405 ug/dL (ref 215–435)
UIBC: 362 ug/dL

## 2010-09-18 LAB — VITAMIN B12: Vitamin B-12: 1273 pg/mL — ABNORMAL HIGH (ref 211–911)

## 2010-09-18 LAB — FERRITIN: Ferritin: 82 ng/mL (ref 22–322)

## 2010-10-03 ENCOUNTER — Encounter (HOSPITAL_COMMUNITY): Admission: RE | Admit: 2010-10-03 | Discharge: 2010-11-28 | Payer: Self-pay | Admitting: Gastroenterology

## 2010-11-11 ENCOUNTER — Encounter (HOSPITAL_COMMUNITY)
Admission: RE | Admit: 2010-11-11 | Discharge: 2010-12-27 | Payer: Self-pay | Source: Home / Self Care | Attending: Gastroenterology | Admitting: Gastroenterology

## 2010-12-31 ENCOUNTER — Encounter (HOSPITAL_COMMUNITY)
Admission: RE | Admit: 2010-12-31 | Discharge: 2011-01-28 | Payer: Self-pay | Source: Home / Self Care | Attending: Internal Medicine | Admitting: Internal Medicine

## 2011-01-18 ENCOUNTER — Encounter: Payer: Self-pay | Admitting: Hematology and Oncology

## 2011-01-19 ENCOUNTER — Encounter: Payer: Self-pay | Admitting: Hematology and Oncology

## 2011-01-29 ENCOUNTER — Encounter: Payer: Self-pay | Admitting: Hematology and Oncology

## 2011-03-23 LAB — CBC
HCT: 39.9 % (ref 39.0–52.0)
Hemoglobin: 13.1 g/dL (ref 13.0–17.0)
MCHC: 32.8 g/dL (ref 30.0–36.0)
MCV: 76.5 fL — ABNORMAL LOW (ref 78.0–100.0)
Platelets: 200 10*3/uL (ref 150–400)
RBC: 5.22 MIL/uL (ref 4.22–5.81)
RDW: 18.7 % — ABNORMAL HIGH (ref 11.5–15.5)
WBC: 5 10*3/uL (ref 4.0–10.5)

## 2011-03-23 LAB — DIFFERENTIAL
Basophils Absolute: 0.1 10*3/uL (ref 0.0–0.1)
Basophils Relative: 1 % (ref 0–1)
Eosinophils Absolute: 0.1 10*3/uL (ref 0.0–0.7)
Eosinophils Relative: 3 % (ref 0–5)
Lymphocytes Relative: 27 % (ref 12–46)
Lymphs Abs: 1.4 10*3/uL (ref 0.7–4.0)
Monocytes Absolute: 0.4 10*3/uL (ref 0.1–1.0)
Monocytes Relative: 8 % (ref 3–12)
Neutro Abs: 3 10*3/uL (ref 1.7–7.7)
Neutrophils Relative %: 61 % (ref 43–77)

## 2011-03-23 LAB — BASIC METABOLIC PANEL WITH GFR
BUN: 8 mg/dL (ref 6–23)
CO2: 23 meq/L (ref 19–32)
Calcium: 8.5 mg/dL (ref 8.4–10.5)
Chloride: 105 meq/L (ref 96–112)
Creatinine, Ser: 0.78 mg/dL (ref 0.4–1.5)
GFR calc non Af Amer: >60 mL/min
Glucose, Bld: 163 mg/dL — ABNORMAL HIGH (ref 70–99)
Potassium: 4.3 meq/L (ref 3.5–5.1)
Sodium: 135 meq/L (ref 135–145)

## 2011-03-23 LAB — GLUCOSE, CAPILLARY
Glucose-Capillary: 100 mg/dL — ABNORMAL HIGH (ref 70–99)
Glucose-Capillary: 175 mg/dL — ABNORMAL HIGH (ref 70–99)

## 2011-03-23 LAB — HEMOCCULT GUIAC POC 1CARD (OFFICE): Fecal Occult Bld: POSITIVE

## 2011-04-02 LAB — POCT I-STAT 4, (NA,K, GLUC, HGB,HCT)
Glucose, Bld: 156 mg/dL — ABNORMAL HIGH (ref 70–99)
HCT: 38 % — ABNORMAL LOW (ref 39.0–52.0)
Hemoglobin: 12.9 g/dL — ABNORMAL LOW (ref 13.0–17.0)
Potassium: 4.3 mEq/L (ref 3.5–5.1)
Sodium: 139 mEq/L (ref 135–145)

## 2011-04-02 LAB — GLUCOSE, CAPILLARY: Glucose-Capillary: 121 mg/dL — ABNORMAL HIGH (ref 70–99)

## 2011-10-08 LAB — CBC
HCT: 43.4
Hemoglobin: 14.4
MCHC: 33.1
MCV: 77.2 — ABNORMAL LOW
Platelets: 249
RBC: 5.62
RDW: 15.2 — ABNORMAL HIGH
WBC: 6.9

## 2011-10-08 LAB — DIFFERENTIAL
Basophils Absolute: 0
Basophils Relative: 0
Eosinophils Absolute: 0.2
Eosinophils Relative: 3
Lymphocytes Relative: 33
Lymphs Abs: 2.3
Monocytes Absolute: 0.6
Monocytes Relative: 9
Neutro Abs: 3.7
Neutrophils Relative %: 54

## 2011-10-08 LAB — BASIC METABOLIC PANEL
BUN: 9
CO2: 27
Calcium: 9.2
Chloride: 98
Creatinine, Ser: 0.76
GFR calc Af Amer: >60
GFR calc non Af Amer: >60
Glucose, Bld: 77
Potassium: 3.9
Sodium: 133 — ABNORMAL LOW

## 2011-10-08 LAB — URINALYSIS, ROUTINE W REFLEX MICROSCOPIC
Bilirubin Urine: NEGATIVE
Glucose, UA: NEGATIVE
Hgb urine dipstick: NEGATIVE
Ketones, ur: NEGATIVE
Nitrite: NEGATIVE
Protein, ur: NEGATIVE
Specific Gravity, Urine: 1.011
Urobilinogen, UA: 0.2
pH: 6

## 2012-04-02 ENCOUNTER — Encounter (HOSPITAL_COMMUNITY): Payer: Self-pay

## 2013-02-28 ENCOUNTER — Encounter (HOSPITAL_COMMUNITY): Payer: Self-pay

## 2013-06-17 ENCOUNTER — Other Ambulatory Visit (HOSPITAL_COMMUNITY): Payer: Self-pay | Admitting: Internal Medicine

## 2013-06-17 ENCOUNTER — Encounter (HOSPITAL_COMMUNITY): Admission: RE | Admit: 2013-06-17 | Payer: Self-pay | Source: Ambulatory Visit

## 2013-06-22 DIAGNOSIS — L410 Pityriasis lichenoides et varioliformis acuta: Secondary | ICD-10-CM | POA: Insufficient documentation

## 2013-07-04 ENCOUNTER — Encounter (HOSPITAL_COMMUNITY)
Admission: RE | Admit: 2013-07-04 | Discharge: 2013-07-04 | Disposition: A | Discharge status: Home / Self Care | Payer: 59 | Source: Ambulatory Visit | Attending: Internal Medicine | Admitting: Internal Medicine

## 2013-07-04 ENCOUNTER — Encounter (HOSPITAL_COMMUNITY): Payer: Self-pay

## 2013-07-04 DIAGNOSIS — D509 Iron deficiency anemia, unspecified: Secondary | ICD-10-CM | POA: Insufficient documentation

## 2013-07-04 HISTORY — DX: Type 2 diabetes mellitus without complications: E11.9

## 2013-07-04 HISTORY — DX: Essential (primary) hypertension: I10

## 2013-07-04 HISTORY — DX: Atherosclerotic heart disease of native coronary artery without angina pectoris: I25.10

## 2013-07-04 HISTORY — DX: Hyperlipidemia, unspecified: E78.5

## 2013-07-04 MED ORDER — SODIUM CHLORIDE 0.9 % IV SOLN
1000.0000 mg | Freq: Once | INTRAVENOUS | Status: AC
  Administered 2013-07-04: 1000 mg via INTRAVENOUS
  Filled 2013-07-04: qty 20

## 2013-07-04 MED ORDER — SODIUM CHLORIDE 0.9 % IV SOLN
25.0000 mg | Freq: Once | INTRAVENOUS | Status: AC
  Administered 2013-07-04: 25 mg via INTRAVENOUS
  Filled 2013-07-04: qty 0.5

## 2013-07-04 MED ORDER — SODIUM CHLORIDE 0.9 % IV SOLN
Freq: Once | INTRAVENOUS | Status: AC
  Administered 2013-07-04: 09:00:00 via INTRAVENOUS

## 2013-07-04 MED ORDER — SODIUM CHLORIDE 0.9 % IV SOLN: 25.0000 mg | Freq: Once | INTRAVENOUS | Status: DC

## 2014-11-08 ENCOUNTER — Encounter (INDEPENDENT_AMBULATORY_CARE_PROVIDER_SITE_OTHER): Payer: 59 | Admitting: Ophthalmology

## 2014-11-08 DIAGNOSIS — E11319 Type 2 diabetes mellitus with unspecified diabetic retinopathy without macular edema: Secondary | ICD-10-CM

## 2014-11-08 DIAGNOSIS — I10 Essential (primary) hypertension: Secondary | ICD-10-CM

## 2014-11-08 DIAGNOSIS — E11329 Type 2 diabetes mellitus with mild nonproliferative diabetic retinopathy without macular edema: Secondary | ICD-10-CM

## 2014-11-08 DIAGNOSIS — H35033 Hypertensive retinopathy, bilateral: Secondary | ICD-10-CM

## 2015-01-10 ENCOUNTER — Other Ambulatory Visit: Payer: Self-pay | Admitting: Nurse Practitioner

## 2015-01-10 ENCOUNTER — Ambulatory Visit
Admission: RE | Admit: 2015-01-10 | Discharge: 2015-01-10 | Disposition: A | Discharge status: Home / Self Care | Payer: 59 | Source: Ambulatory Visit | Attending: Nurse Practitioner | Admitting: Nurse Practitioner

## 2015-01-10 DIAGNOSIS — M778 Other enthesopathies, not elsewhere classified: Secondary | ICD-10-CM

## 2015-01-25 ENCOUNTER — Ambulatory Visit
Admission: RE | Admit: 2015-01-25 | Discharge: 2015-01-25 | Disposition: A | Discharge status: Home / Self Care | Payer: 59 | Source: Ambulatory Visit | Attending: Internal Medicine | Admitting: Internal Medicine

## 2015-01-25 ENCOUNTER — Other Ambulatory Visit: Payer: Self-pay | Admitting: Internal Medicine

## 2015-01-25 DIAGNOSIS — M5136 Other intervertebral disc degeneration, lumbar region: Secondary | ICD-10-CM

## 2015-02-23 ENCOUNTER — Encounter (HOSPITAL_COMMUNITY): Payer: 59

## 2015-03-12 ENCOUNTER — Encounter (HOSPITAL_COMMUNITY)
Admission: RE | Admit: 2015-03-12 | Discharge: 2015-03-12 | Disposition: A | Discharge status: Home / Self Care | Payer: 59 | Source: Ambulatory Visit | Attending: Internal Medicine | Admitting: Internal Medicine

## 2015-03-12 ENCOUNTER — Encounter (HOSPITAL_COMMUNITY): Payer: Self-pay

## 2015-03-12 ENCOUNTER — Other Ambulatory Visit (HOSPITAL_COMMUNITY): Payer: Self-pay | Admitting: Internal Medicine

## 2015-03-12 DIAGNOSIS — D509 Iron deficiency anemia, unspecified: Secondary | ICD-10-CM | POA: Diagnosis present

## 2015-03-12 MED ORDER — SODIUM CHLORIDE 0.9 % IV SOLN
25.0000 mg | Freq: Once | INTRAVENOUS | Status: AC
  Administered 2015-03-12: 25 mg via INTRAVENOUS
  Filled 2015-03-12: qty 0.5

## 2015-03-12 MED ORDER — SODIUM CHLORIDE 0.9 % IV SOLN
Freq: Once | INTRAVENOUS | Status: AC
  Administered 2015-03-12: 10:00:00 via INTRAVENOUS
  Filled 2015-03-12: qty 1000

## 2015-03-12 MED ORDER — SODIUM CHLORIDE 0.9 % IV SOLN
Freq: Once | INTRAVENOUS | Status: AC
  Administered 2015-03-12: 07:00:00 via INTRAVENOUS

## 2015-03-12 NOTE — Progress Notes (Signed)
Infed received today over 6 hours, started at 0935 and finished at 1605.  Pt tolerated well.  Pt was given lunch tray at 1130 today also.  Pt was d/c home ambulatory at 1610.

## 2015-03-12 NOTE — Discharge Instructions (Signed)

## 2015-04-02 ENCOUNTER — Other Ambulatory Visit: Payer: Self-pay | Admitting: Urology

## 2015-04-18 ENCOUNTER — Encounter (HOSPITAL_BASED_OUTPATIENT_CLINIC_OR_DEPARTMENT_OTHER): Payer: Self-pay | Admitting: *Deleted

## 2015-04-19 ENCOUNTER — Encounter (HOSPITAL_BASED_OUTPATIENT_CLINIC_OR_DEPARTMENT_OTHER): Payer: Self-pay | Admitting: *Deleted

## 2015-04-19 NOTE — Progress Notes (Addendum)
NPO AFTER MN.  ARRIVE AT 0730. NEEDS ISTAT 8  AND EKG. WILL TAKE NORVASC AND LABETOLOL AM DOS W/ SIPS OF WATER.  WILL DO HALF DOSE LANTUS HS BEFORE DOS, 20 UNITS.  WILL DO HIBICLENS SHOWER HS BEFORE AND AM DOS.

## 2015-04-23 ENCOUNTER — Other Ambulatory Visit: Payer: Self-pay

## 2015-04-23 ENCOUNTER — Ambulatory Visit (HOSPITAL_BASED_OUTPATIENT_CLINIC_OR_DEPARTMENT_OTHER): Payer: 59 | Admitting: Anesthesiology

## 2015-04-23 ENCOUNTER — Ambulatory Visit (HOSPITAL_BASED_OUTPATIENT_CLINIC_OR_DEPARTMENT_OTHER)
Admission: RE | Admit: 2015-04-23 | Discharge: 2015-04-23 | Disposition: A | Discharge status: Home / Self Care | Payer: 59 | Source: Ambulatory Visit | Attending: Urology | Admitting: Urology

## 2015-04-23 ENCOUNTER — Encounter (HOSPITAL_BASED_OUTPATIENT_CLINIC_OR_DEPARTMENT_OTHER)
Admission: RE | Disposition: A | Discharge status: Home / Self Care | Payer: Self-pay | Source: Ambulatory Visit | Attending: Urology

## 2015-04-23 ENCOUNTER — Encounter (HOSPITAL_BASED_OUTPATIENT_CLINIC_OR_DEPARTMENT_OTHER): Payer: Self-pay | Admitting: *Deleted

## 2015-04-23 DIAGNOSIS — D649 Anemia, unspecified: Secondary | ICD-10-CM | POA: Diagnosis not present

## 2015-04-23 DIAGNOSIS — M1990 Unspecified osteoarthritis, unspecified site: Secondary | ICD-10-CM | POA: Insufficient documentation

## 2015-04-23 DIAGNOSIS — I2510 Atherosclerotic heart disease of native coronary artery without angina pectoris: Secondary | ICD-10-CM | POA: Insufficient documentation

## 2015-04-23 DIAGNOSIS — T83410A Breakdown (mechanical) of penile (implanted) prosthesis, initial encounter: Secondary | ICD-10-CM | POA: Diagnosis present

## 2015-04-23 DIAGNOSIS — Z85048 Personal history of other malignant neoplasm of rectum, rectosigmoid junction, and anus: Secondary | ICD-10-CM | POA: Insufficient documentation

## 2015-04-23 DIAGNOSIS — Z79899 Other long term (current) drug therapy: Secondary | ICD-10-CM | POA: Diagnosis not present

## 2015-04-23 DIAGNOSIS — I10 Essential (primary) hypertension: Secondary | ICD-10-CM | POA: Diagnosis not present

## 2015-04-23 DIAGNOSIS — Z7982 Long term (current) use of aspirin: Secondary | ICD-10-CM | POA: Diagnosis not present

## 2015-04-23 DIAGNOSIS — Z794 Long term (current) use of insulin: Secondary | ICD-10-CM | POA: Diagnosis not present

## 2015-04-23 DIAGNOSIS — E109 Type 1 diabetes mellitus without complications: Secondary | ICD-10-CM | POA: Diagnosis not present

## 2015-04-23 DIAGNOSIS — N5239 Other post-surgical erectile dysfunction: Secondary | ICD-10-CM | POA: Insufficient documentation

## 2015-04-23 DIAGNOSIS — T83490D Other mechanical complication of penile (implanted) prosthesis, subsequent encounter: Secondary | ICD-10-CM

## 2015-04-23 HISTORY — DX: Iron deficiency anemia, unspecified: D50.9

## 2015-04-23 HISTORY — DX: Male erectile dysfunction, unspecified: N52.9

## 2015-04-23 HISTORY — PX: EXAMINATION UNDER ANESTHESIA: SHX1540

## 2015-04-23 HISTORY — DX: Other intervertebral disc degeneration, lumbar region without mention of lumbar back pain or lower extremity pain: M51.369

## 2015-04-23 HISTORY — DX: Personal history of other malignant neoplasm of rectum, rectosigmoid junction, and anus: Z85.048

## 2015-04-23 HISTORY — DX: Other intervertebral disc degeneration, lumbar region: M51.36

## 2015-04-23 LAB — GLUCOSE, CAPILLARY: Glucose-Capillary: 216 mg/dL — ABNORMAL HIGH (ref 70–99)

## 2015-04-23 LAB — POCT I-STAT, CHEM 8
BUN: 18 mg/dL (ref 6–23)
Calcium, Ion: 1.23 mmol/L (ref 1.12–1.23)
Chloride: 104 mmol/L (ref 96–112)
Creatinine, Ser: 0.8 mg/dL (ref 0.50–1.35)
Glucose, Bld: 124 mg/dL — ABNORMAL HIGH (ref 70–99)
HCT: 46 % (ref 39.0–52.0)
Hemoglobin: 15.6 g/dL (ref 13.0–17.0)
Potassium: 4.4 mmol/L (ref 3.5–5.1)
Sodium: 140 mmol/L (ref 135–145)
TCO2: 22 mmol/L (ref 0–100)

## 2015-04-23 SURGERY — EXAM UNDER ANESTHESIA
Anesthesia: General | Site: Penis

## 2015-04-23 MED ORDER — CHLORHEXIDINE GLUCONATE 4 % EX LIQD
Freq: Once | CUTANEOUS | Status: DC
  Filled 2015-04-23: qty 15

## 2015-04-23 MED ORDER — ONDANSETRON HCL 4 MG/2ML IJ SOLN
INTRAMUSCULAR | Status: DC | PRN
  Administered 2015-04-23: 4 mg via INTRAVENOUS

## 2015-04-23 MED ORDER — FENTANYL CITRATE (PF) 100 MCG/2ML IJ SOLN
INTRAMUSCULAR | Status: AC
  Filled 2015-04-23: qty 6

## 2015-04-23 MED ORDER — PROPOFOL 10 MG/ML IV BOLUS
INTRAVENOUS | Status: DC | PRN
  Administered 2015-04-23: 200 mg via INTRAVENOUS

## 2015-04-23 MED ORDER — LIDOCAINE HCL (CARDIAC) 20 MG/ML IV SOLN
INTRAVENOUS | Status: DC | PRN
  Administered 2015-04-23: 80 mg via INTRAVENOUS

## 2015-04-23 MED ORDER — CEFAZOLIN SODIUM-DEXTROSE 2-3 GM-% IV SOLR
2.0000 g | INTRAVENOUS | Status: AC
Start: 2015-04-23 — End: 2015-04-23
  Administered 2015-04-23: 2 g via INTRAVENOUS
  Filled 2015-04-23: qty 50

## 2015-04-23 MED ORDER — MIDAZOLAM HCL 2 MG/2ML IJ SOLN
INTRAMUSCULAR | Status: AC
  Filled 2015-04-23: qty 2

## 2015-04-23 MED ORDER — LACTATED RINGERS IV SOLN
INTRAVENOUS | Status: DC
  Administered 2015-04-23 (×2): via INTRAVENOUS
  Filled 2015-04-23: qty 1000

## 2015-04-23 MED ORDER — FENTANYL CITRATE (PF) 100 MCG/2ML IJ SOLN
INTRAMUSCULAR | Status: DC | PRN
  Administered 2015-04-23 (×2): 25 ug via INTRAVENOUS
  Administered 2015-04-23 (×2): 50 ug via INTRAVENOUS

## 2015-04-23 MED ORDER — MIDAZOLAM HCL 5 MG/5ML IJ SOLN
INTRAMUSCULAR | Status: DC | PRN
  Administered 2015-04-23: 2 mg via INTRAVENOUS

## 2015-04-23 MED ORDER — EPHEDRINE SULFATE 50 MG/ML IJ SOLN
INTRAMUSCULAR | Status: DC | PRN
  Administered 2015-04-23 (×2): 10 mg via INTRAVENOUS

## 2015-04-23 MED ORDER — GENTAMICIN SULFATE 40 MG/ML IJ SOLN
5.0000 mg/kg | INTRAVENOUS | Status: AC
  Administered 2015-04-23: 430 mg via INTRAVENOUS
  Filled 2015-04-23: qty 10.75

## 2015-04-23 MED ORDER — DEXAMETHASONE SODIUM PHOSPHATE 4 MG/ML IJ SOLN
INTRAMUSCULAR | Status: DC | PRN
  Administered 2015-04-23: 10 mg via INTRAVENOUS

## 2015-04-23 MED ORDER — HYDROMORPHONE HCL 1 MG/ML IJ SOLN
0.2500 mg | INTRAMUSCULAR | Status: DC | PRN
  Filled 2015-04-23: qty 1

## 2015-04-23 MED ORDER — CEFAZOLIN SODIUM 1-5 GM-% IV SOLN
INTRAVENOUS | Status: AC
  Filled 2015-04-23: qty 100

## 2015-04-23 SURGICAL SUPPLY — 46 items
ASSEMBLY KIT IMPLANT
BAG DECANTER FOR FLEXI CONT (MISCELLANEOUS) ×2 IMPLANT
BLADE CLIPPER SURG (BLADE) ×2 IMPLANT
BLADE HEX COATED 2.75 (ELECTRODE) ×1 IMPLANT
BLADE SURG 15 STRL LF DISP TIS (BLADE) ×1 IMPLANT
BLADE SURG 15 STRL SS (BLADE)
CATH FOLEY 2WAY SLVR  5CC 16FR (CATHETERS) ×1
CATH FOLEY 2WAY SLVR 5CC 16FR (CATHETERS) ×2 IMPLANT
CHLORAPREP W/TINT 26ML (MISCELLANEOUS) ×3 IMPLANT
COVER BACK TABLE 60X90IN (DRAPES) ×3 IMPLANT
COVER MAYO STAND STRL (DRAPES) ×2 IMPLANT
DRAPE EXTREMITY T 121X128X90 (DRAPE) ×3 IMPLANT
ELECT REM PT RETURN 9FT ADLT (ELECTROSURGICAL) ×3
ELECTRODE REM PT RTRN 9FT ADLT (ELECTROSURGICAL) ×2 IMPLANT
GLOVE BIO SURGEON STRL SZ8 (GLOVE) ×3 IMPLANT
GLOVE BIOGEL PI IND STRL 7.5 (GLOVE) ×1 IMPLANT
GLOVE BIOGEL PI INDICATOR 7.5 (GLOVE) ×1
GLOVE SURG SS PI 7.5 STRL IVOR (GLOVE) ×2 IMPLANT
GOWN STRL REUS W/ TWL LRG LVL3 (GOWN DISPOSABLE) ×2 IMPLANT
GOWN STRL REUS W/ TWL XL LVL3 (GOWN DISPOSABLE) ×2 IMPLANT
GOWN STRL REUS W/TWL LRG LVL3 (GOWN DISPOSABLE) ×3
GOWN STRL REUS W/TWL XL LVL3 (GOWN DISPOSABLE) ×3
IV NS 250ML (IV SOLUTION)
IV NS 250ML BAXH (IV SOLUTION) ×1 IMPLANT
KIT TITAN ASSEMBLY (Erectile Restoration) IMPLANT
KIT TITAN ASSEMBLY STANDARD (Erectile Restoration) IMPLANT
KIT TITAN ASSEMBLY STD (Erectile Restoration) IMPLANT
NDL HYPO 25X1 1.5 SAFETY (NEEDLE) ×1 IMPLANT
NEEDLE HYPO 25X1 1.5 SAFETY (NEEDLE) IMPLANT
PACK BASIN DAY SURGERY FS (CUSTOM PROCEDURE TRAY) ×1 IMPLANT
PENCIL BUTTON HOLSTER BLD 10FT (ELECTRODE) ×1 IMPLANT
PLUG CATH AND CAP STER (CATHETERS) ×3 IMPLANT
RETRACTOR WILSON SYSTEM (INSTRUMENTS) ×1 IMPLANT
SPONGE LAP 4X18 X RAY DECT (DISPOSABLE) ×3 IMPLANT
SUT CHROMIC 3 0 SH 27 (SUTURE) ×3 IMPLANT
SUT VIC AB 2-0 UR6 27 (SUTURE) ×2 IMPLANT
SYR 20CC LL (SYRINGE) ×1 IMPLANT
SYR 50ML LL SCALE MARK (SYRINGE) ×2 IMPLANT
SYR BULB IRRIGATION 50ML (SYRINGE) ×1 IMPLANT
SYR CONTROL 10ML LL (SYRINGE) ×1 IMPLANT
SYRINGE 10CC LL (SYRINGE) ×3 IMPLANT
TOWEL OR 17X24 6PK STRL BLUE (TOWEL DISPOSABLE) ×4 IMPLANT
TRAY DSU PREP LF (CUSTOM PROCEDURE TRAY) ×3 IMPLANT
TUBE CONNECTING 12X1/4 (SUCTIONS) ×1 IMPLANT
WATER STERILE IRR 500ML POUR (IV SOLUTION) ×3 IMPLANT
YANKAUER SUCT BULB TIP NO VENT (SUCTIONS) ×1 IMPLANT

## 2015-04-23 NOTE — Transfer of Care (Signed)
Immediate Anesthesia Transfer of Care Note  Patient: Phillip Tate  Procedure(s) Performed: Procedure(s) (LRB): EXAM UNDER ANESTHESIA, MANIPULATION OF PENILE PROSTHESIS PUMP (N/A)  Patient Location: PACU  Anesthesia Type: General  Level of Consciousness: awake, oriented, sedated and patient cooperative  Airway & Oxygen Therapy: Patient Spontanous Breathing and Patient connected to face mask oxygen  Post-op Assessment: Report given to PACU RN and Post -op Vital signs reviewed and stable  Post vital signs: Reviewed and stable  Complications: No apparent anesthesia complications

## 2015-04-23 NOTE — Discharge Instructions (Signed)

## 2015-04-23 NOTE — Op Note (Signed)
PATIENT:  Phillip Tate  PRE-OPERATIVE DIAGNOSIS: Malfunctioning 3 piece inflatable penile prosthesis  POST-OPERATIVE DIAGNOSIS: Same  PROCEDURE: Examination under anesthesia and manipulation of pump under anesthesia   SURGEON:  Claybon Jabs  INDICATION: Phillip Tate is a 59 year old male who had a 3 piece inflatable penile prosthesis implanted on 10/29/09. He developed an inability to pump the device and said he seemed to believe that he would need to pump the device during intercourse in order to maintain an erection. On examination in my office I found that his pump was firm and could not be depressed. I tried the release valve but this did not appear to be working properly. We therefore discussed the need for exploration and replacement of malfunctioning parts. He understands and elected to proceed.  ANESTHESIA:  General  EBL:  Minimal  DRAINS: None  LOCAL MEDICATIONS USED:  None  SPECIMEN:  None  Description of procedure: After informed consent the patient was taken to the operating room and placed on the table in a supine position. General anesthesia was then administered. Once fully anesthetized the patient the genitalia were sterilely prepped and draped in standard fashion. An official timeout was then performed.  I initially placed a 11 French Foley catheter in the bladder and drain the bladder. I then attempted to compress the pump but despite a great deal of force was unable to. I then depressed the deflate mechanism while simultaneously applying a great deal of pressure to the pump and did seem to feel the pump give way. I was then able to pump the device but I seem to be meeting some degree of resistance. I was able to fully inflate the prosthesis. With it inflated it was noted to be in good position with the left cylinder just a few millimeters proximal to the right cylinder but both within good position beneath the glans. I then deflated the device and tried  you pump it once again and it pumped much easier. I was able to pump it up completely once again and it maintained fluid within the cylinders without apparent leakage. I therefore deflated the device, removed his Foley catheter and he was awakened and taken to the recovery room in stable and satisfactory condition.  It appears his device had somehow malfunctioned and become locked however this was rectified without the need for any form of surgical exploration.  PLAN OF CARE: Discharge to home after PACU  PATIENT DISPOSITION:  PACU - hemodynamically stable.

## 2015-04-23 NOTE — Anesthesia Procedure Notes (Signed)
Procedure Name: LMA Insertion Date/Time: 04/23/2015 9:09 AM Performed by: Denna Haggard D Pre-anesthesia Checklist: Patient identified, Emergency Drugs available, Suction available and Patient being monitored Patient Re-evaluated:Patient Re-evaluated prior to inductionOxygen Delivery Method: Circle System Utilized Preoxygenation: Pre-oxygenation with 100% oxygen Intubation Type: IV induction Ventilation: Mask ventilation without difficulty LMA: LMA inserted LMA Size: 4.0 Number of attempts: 1 Airway Equipment and Method: Bite block Placement Confirmation: positive ETCO2 Tube secured with: Tape Dental Injury: Teeth and Oropharynx as per pre-operative assessment

## 2015-04-23 NOTE — Anesthesia Postprocedure Evaluation (Signed)
  Anesthesia Post-op Note  Patient: Phillip Tate  Procedure(s) Performed: Procedure(s): EXAM UNDER ANESTHESIA, MANIPULATION OF PENILE PROSTHESIS PUMP (N/A)  Patient Location: PACU  Anesthesia Type:General  Level of Consciousness: awake and alert   Airway and Oxygen Therapy: Patient Spontanous Breathing  Post-op Pain: none  Post-op Assessment: Post-op Vital signs reviewed, Patient's Cardiovascular Status Stable and Respiratory Function Stable  Post-op Vital Signs: Reviewed  Filed Vitals:   04/23/15 1015  BP: 121/72  Pulse: 72  Temp:   Resp: 15    Complications: No apparent anesthesia complications

## 2015-04-23 NOTE — Anesthesia Preprocedure Evaluation (Addendum)
Anesthesia Evaluation  Patient identified by MRN, date of birth, ID band Patient awake    Reviewed: Allergy & Precautions, H&P , NPO status , Patient's Chart, lab work & pertinent test results, reviewed documented beta blocker date and time   Airway Mallampati: II  TM Distance: >3 FB Neck ROM: Full    Dental no notable dental hx. (+) Teeth Intact, Dental Advisory Given   Pulmonary neg pulmonary ROS,  breath sounds clear to auscultation  Pulmonary exam normal       Cardiovascular hypertension, Pt. on medications and Pt. on home beta blockers + CAD Rhythm:Regular Rate:Normal     Neuro/Psych negative neurological ROS  negative psych ROS   GI/Hepatic negative GI ROS, Neg liver ROS,   Endo/Other  diabetes, Type 1, Insulin Dependent  Renal/GU negative Renal ROS  negative genitourinary   Musculoskeletal  (+) Arthritis -, Osteoarthritis,    Abdominal   Peds  Hematology negative hematology ROS (+) anemia ,   Anesthesia Other Findings   Reproductive/Obstetrics negative OB ROS                            Anesthesia Physical Anesthesia Plan  ASA: III  Anesthesia Plan: General   Post-op Pain Management:    Induction: Intravenous  Airway Management Planned: LMA  Additional Equipment:   Intra-op Plan:   Post-operative Plan: Extubation in OR  Informed Consent: I have reviewed the patients History and Physical, chart, labs and discussed the procedure including the risks, benefits and alternatives for the proposed anesthesia with the patient or authorized representative who has indicated his/her understanding and acceptance.   Dental advisory given  Plan Discussed with: CRNA  Anesthesia Plan Comments:         Anesthesia Quick Evaluation

## 2015-04-23 NOTE — H&P (Signed)
[name] is a 59 year old male seen today for further discussion about his malfunctioning penile prosthesis.   History of Present Illness           1) LUTS: He is s/p an abdominoperineal resection for rectal carcinoma and initially complained of difficulty emptying his bladder. This has been treated with timed voiding.        2) Left testicular pain: He had an initial episode of acute left epididymitis following his APR. He subsequently had development of a complex hydrocele and the development of chronic left scrotal/testicular pain. This has been managed conservatively and had reached a point where the patient was considering an epididymectomy. However, his symptoms subsequently relented and he did not proceed with surgery.        3) Erectile dysfunction: He has erectile dysfunction which has been felt to be multifactorial and related to his APR, diabetes, and medications. He initally responded to PDE-5 inhibitors.        Current treatment: He underwent a three-piece Coloplast penile prosthesis implantation in 11/10.        Prior treatment: PDE-5 inhibitors (failed), MUSE (not satisfactory)        4) Decreased libido: He has had a borderline testosterone level with intermittent periods of decreased libido in the past.        5) Elective sterilization: He had a vasectomy on October 06, 2007.        6) Prostate cancer screening:       7) Malfunctioning penile prosthesis: In 2/16 device abruptly ceased to function properly. When he tried to pump the device he was unable to. He met a lot of resistance. He has had no discomfort but he says it was a long comfortable as he had squeezed the pump so hard.       Interval history: At his last visit we discussed the possible causes for this and the fact that the most likely location for a leak which is almost certainly what has happened is in the area of the pump and its tubing. I told him that in order to repair the device  it would require surgical exploration of the scrotum. We discussed the replacement of his pump if this was the portion of the device it was felt he with the understanding that if this was not the cause I might have to replace one or both cylinders and possibly even the reservoir. Replacement of the reservoir might even require a second incision and we discussed that as well. At that time he indicated he wanted to proceed with repair. He wanted to come in and talk to me about the pump. He had failed to mention this at his last visit and what he described was the fact that in addition to his pump no longer functioning he said that when he was working he noted that there would be some slight detumescence and he would have to pump a couple more times during intercourse.    Past Medical History Problems  1. History of diabetes mellitus (Z86.39) 2. History of hypertension (Z86.79) 3. History of Rectal Cancer  Surgical History Problems  1. History of Abdominoperineal Resection 2. History of Surg Penis Insertion Of Penile Prosthesis  Current Meds 1. Adult Aspirin Low Strength 81 MG TBDP;  Therapy: (Recorded:10Apr2008) to Recorded 2. Byetta 10 MCG Pen 10 MCG/0.04ML SOLN;  Therapy: 31Oct2013 to Recorded 3. Crestor 5 MG Oral Tablet;  Therapy: 22Mar2012 to Recorded 4. Daily Multiple Vitamins TABS;  Therapy: (Recorded:10Apr2008)  to Recorded 5. GNP Fish Oil CAPS;  Therapy: (Recorded:10Apr2008) to Recorded 6. HumaLOG KwikPen 100 UNIT/ML SOLN;  Therapy: [id number] to Recorded 7. Ketoconazole 2 % External Cream;  Therapy: 11Apr2014 to Recorded 8. Lantus 100 UNIT/ML Subcutaneous Solution;  Therapy: (Recorded:10Apr2008) to Recorded 9. Lipitor TABS;  Therapy: (Recorded:10Apr2008) to Recorded 10. Lisinopril 40 MG Oral Tablet;   Therapy: 27Feb2012 to Recorded 11. Livalo 2 MG Oral Tablet;   Therapy: [id number] to Recorded 12. MetFORMIN HCl TABS;   Therapy: (Recorded:10Apr2008) to Recorded 13.  Metoprolol Succinate ER 50 MG Oral Tablet Extended Release 24 Hour;   Therapy: (Recorded:04Mar2009) to Recorded 14. Norvasc 10 MG Oral Tablet;   Therapy: (Recorded:10Apr2008) to Recorded 15. NovoLOG 100 UNIT/ML Subcutaneous Solution;   Therapy: (Recorded:10Apr2008) to Recorded 16. Sertraline HCl - 50 MG Oral Tablet;   Therapy: 11Apr2012 to Recorded 17. SymlinPen 120 2700 MCG/2.7ML SOLN;   Therapy: 30Dec2010 to Recorded 18. Valtrex TABS;   Therapy: (Recorded:10Apr2008) to Recorded  Allergies Medication  1. No Known Drug Allergies  Social History Problems  1. Alcohol Use   1 beer 2. Marital History - Currently Married 3. Never A Smoker 4. Occupation:   Archivist 5. Denied: Tobacco Use  Review of Systems Genitourinary, constitutional, skin, eye, otolaryngeal, hematologic/lymphatic, cardiovascular, pulmonary, endocrine, musculoskeletal, gastrointestinal, neurological and psychiatric system(s) were reviewed and pertinent findings if present are noted.  Genitourinary: feelings of urinary urgency erectile dysfunction with malfunctioning penile prosthesis   Vitals Vital Signs  Height: 5 ft 10 in Weight: 230 lb  BMI Calculated: 33 BSA Calculated: 2.22 Blood Pressure: 112 / 59 Heart Rate: 79  Physical Exam Constitutional: Well nourished and well developed . No acute distress.  ENT:. The ears and nose are normal in appearance.  Neck: The appearance of the neck is normal and no neck mass is present.  Pulmonary: No respiratory distress and normal respiratory rhythm and effort.  Cardiovascular: Heart rate and rhythm are normal . No peripheral edema.  Abdomen: The abdomen is soft and nontender. No masses are palpated. No CVA tenderness. No hernias are palpable. No hepatosplenomegaly noted.  Scars from his previous abdominal surgery or in the midline and left lower quadrant.   Genitourinary: Examination of the penis demonstrates no discharge, no masses, no lesions and a normal meatus.  The scrotum is without lesions. The right epididymis is palpably normal and non-tender. The left epididymis is palpably normal and non-tender. The right testis is non-tender and without masses. The left testis is non-tender and without masses.  The prosthesis is in good position.  The pump is present in the right hemiscrotum.   Lymphatics: The femoral and inguinal nodes are not enlarged or tender.  Skin: Normal skin turgor, no visible rash and no visible skin lesions.  Neuro/Psych:. Mood and affect are appropriate.    Assessment Reassessment of his pump reveals that I can pump it with some force but the cylinders do not fill. We discussed again the fact that at the time of his surgery I would explore the scrotum and determine if the problem was alone. If that was the case I would just change the pump with the newer design and he was pleased to hear that that would be the case. I told him that that would very likely also prevent the problem that he had with the device deflated and somewhat during intercourse. I told him also that if the pump did appear to be functioning without evidence of leak then I would potentially have to replace the cylinders  as well. He understands that and would like to proceed.   Plan He will be scheduled for scrotal exploration and repair of his malfunctioning inflatable penile prosthesis.

## 2015-04-24 ENCOUNTER — Encounter (HOSPITAL_BASED_OUTPATIENT_CLINIC_OR_DEPARTMENT_OTHER): Payer: Self-pay | Admitting: Urology

## 2015-08-22 ENCOUNTER — Other Ambulatory Visit: Payer: Self-pay | Admitting: Gastroenterology

## 2015-10-04 ENCOUNTER — Encounter: Payer: Self-pay | Admitting: Interventional Cardiology

## 2015-10-11 ENCOUNTER — Ambulatory Visit (INDEPENDENT_AMBULATORY_CARE_PROVIDER_SITE_OTHER): Payer: 59 | Admitting: Interventional Cardiology

## 2015-10-11 ENCOUNTER — Encounter: Payer: Self-pay | Admitting: Interventional Cardiology

## 2015-10-11 VITALS — BP 136/80 | HR 83 | Ht 70.0 in | Wt 230.0 lb

## 2015-10-11 DIAGNOSIS — R0789 Other chest pain: Secondary | ICD-10-CM | POA: Diagnosis not present

## 2015-10-11 DIAGNOSIS — R0609 Other forms of dyspnea: Secondary | ICD-10-CM

## 2015-10-11 DIAGNOSIS — E119 Type 2 diabetes mellitus without complications: Secondary | ICD-10-CM | POA: Insufficient documentation

## 2015-10-11 DIAGNOSIS — E785 Hyperlipidemia, unspecified: Secondary | ICD-10-CM

## 2015-10-11 DIAGNOSIS — I10 Essential (primary) hypertension: Secondary | ICD-10-CM | POA: Diagnosis not present

## 2015-10-11 DIAGNOSIS — R0600 Dyspnea, unspecified: Secondary | ICD-10-CM

## 2015-10-11 DIAGNOSIS — E1151 Type 2 diabetes mellitus with diabetic peripheral angiopathy without gangrene: Secondary | ICD-10-CM

## 2015-10-11 DIAGNOSIS — Z794 Long term (current) use of insulin: Secondary | ICD-10-CM | POA: Insufficient documentation

## 2015-10-11 NOTE — Patient Instructions (Signed)
**Note De-Identified  Obfuscation** Medication Instructions:  Same-no changes  Labwork: None  Testing/Procedures: Your physician has requested that you have en exercise stress myoview. For further information please visit [email or web address]. Please follow instruction sheet, as given.   Follow-Up: Pending results

## 2015-10-11 NOTE — Progress Notes (Signed)
Patient ID: Phillip Tate, male   DOB: 01/31/1956, 59 y.o.   MRN: [id number]     Cardiology Office Note   Date:  10/11/2015   ID:  Phillip Tate, Phillip Tate Jun 11, 1956, MRN [id number]  PCP:  Phillip Low, MD    Chief Complaint  Patient presents with  . NEW PATIENT     Wt Readings from Last 3 Encounters:  10/11/15 230 lb (104.327 kg)  04/23/15 228 lb (103.42 kg)  07/04/13 223 lb 12.8 oz (101.515 kg)       History of Present Illness: Phillip Tate is a 59 y.o. male  Who has had colonoscopy. He has had negative stress test in the past.  HE has had LE swelling in the past but this has improved.  He had a 20 mm Hg difference between BP readings in his arms.     He walks occasionally for exercise.  He has been limited by DOE.   He has a tightnesss in his chest that comes on after a few monutes of walking.  It resolves with rest.  He has been walking less in the last few years.     He had right cataract surgery.  He had to have a new lens placed.    He has persistent incontinence due to surgery for rectal cancer in the past.      Past Medical History  Diagnosis Date  . Hypertension   . Hyperlipidemia   . Coronary artery disease   . Organic impotence   . History of rectal cancer     dx 2006 --  Stage III, T3  N1  M0,  s/p  chemoradiation (07-10-2005 to 09-05-2005) and anterior colon resection 11-04-2006  . DDD (degenerative disc disease), lumbar   . Iron deficiency anemia     intolerent PO iron--  gets IV iron,  last one 03-12-2015 (Infed)  . Diabetes mellitus without complication ([place])   . Dyslipidemia   . Rectal cancer ([place])     adenocarcinoma stage lll    Past Surgical History  Procedure Laterality Date  . Penile prosthesis placement  10-29-2009  . Port a cath placement  07-23-2005  . Anterior colon resection w/ colostomy  11-04-2006  . Colostomy takedown  june 2007  . Colonoscopy with esophagogastroduodenoscopy (egd)  last one 03-07-2010  .  Cataract extraction w/ intraocular lens implant Right sept 2015  &  feb 2016  . Examination under anesthesia N/A 04/23/2015    Procedure: EXAM UNDER ANESTHESIA, MANIPULATION OF PENILE PROSTHESIS PUMP;  Surgeon: Kathie Rhodes, MD;  Location: [facility];  Service: Urology;  Laterality: N/A;     Current Outpatient Prescriptions  Medication Sig Dispense Refill  . amLODipine (NORVASC) 10 MG tablet Take 20 mg by mouth every morning.    . empagliflozin (JARDIANCE) 25 MG TABS tablet Take 25 mg by mouth daily.    . fish oil-omega-3 fatty acids 1000 MG capsule Take 1,500 mg by mouth daily.    . hyoscyamine (LEVSIN, ANASPAZ) 0.125 MG tablet Take 0.125 mg by mouth daily.    Marland Kitchen ibuprofen (ADVIL,MOTRIN) 200 MG tablet Take 200 mg by mouth every 6 (six) hours as needed for fever or moderate pain.    Marland Kitchen insulin glargine (LANTUS) 100 UNIT/ML injection Inject 40-42 Units into the skin at bedtime.     . insulin lispro (HUMALOG) 100 UNIT/ML injection Inject 16-40 Units into the skin 3 (three) times daily before meals. Sliding scan  16units am, 25-30units lunch. 35-40units supper    .  labetalol (NORMODYNE) 300 MG tablet Take 300 mg by mouth 2 (two) times daily.    Marland Kitchen lisinopril (PRINIVIL,ZESTRIL) 40 MG tablet Take 40 mg by mouth every evening.     . metFORMIN (GLUCOPHAGE) 1000 MG tablet Take 1,000 mg by mouth 2 (two) times daily with a meal.    . Multiple Vitamin (MULTIVITAMIN) tablet Take 1 tablet by mouth daily.    . polyvinyl alcohol (LIQUIFILM TEARS) 1.4 % ophthalmic solution Place 1 drop into both eyes as needed for dry eyes.    . rosuvastatin (CRESTOR) 10 MG tablet Take 10 mg by mouth daily.    Marland Kitchen VALACYCLOVIR HCL PO Take 1 mg by mouth daily.    Marland Kitchen zolpidem (AMBIEN) 10 MG tablet Take 10 mg by mouth at bedtime.     No current facility-administered medications for this visit.    Allergies:   Cardizem cd; Toprol xl; and Tricor    Social History:  The patient  reports that he has never smoked. He  has never used smokeless tobacco. He reports that he does not drink alcohol or use illicit drugs.   Family History:  The patient's family history includes Diabetes in his mother; Hypertension in his father.    ROS:  Please see the history of present illness.   Otherwise, review of systems are positive for incontinence.   All other systems are reviewed and negative.    PHYSICAL EXAM: VS:  BP 136/80 mmHg  Pulse 83  Ht 5\' 10"  (1.778 m)  Wt 230 lb (104.327 kg)  BMI 33.00 kg/m2  SpO2 97% , BMI Body mass index is 33 kg/(m^2). GEN: Well nourished, well developed, in no acute distress HEENT: normal Neck: no JVD, carotid bruits, or masses Cardiac: RRR; no murmurs, rubs, or gallops,no edema ; 3+ left radial pulse; 2+ left radial pulse Respiratory:  clear to auscultation bilaterally, normal work of breathing GI: soft, nontender, nondistended, + BS MS: no deformity or atrophy Skin: warm and dry, no rash Neuro:  Strength and sensation are intact Psych: euthymic mood, full affect   EKG:   The ekg ordered 4/16: NSR, NSST   Recent Labs: 04/23/2015: BUN 18; Creatinine, Ser 0.80; Hemoglobin 15.6; Potassium 4.4; Sodium 140   Lipid Panel No results found for: CHOL, TRIG, HDL, CHOLHDL, VLDL, LDLCALC, LDLDIRECT   Other studies Reviewed: Additional studies/ records that were reviewed today with results demonstrating: .   ASSESSMENT AND PLAN:  1. Chest discomfort/ SHOB:  Eval for ischemia with nuclear stress test.  SOme evidence of PAD with difference in arm BPs.  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 2. DM: Managed by endocrinology. 3. HTN: Controlled.  Continue current meds.  4. Hyperlipidemia: Crestor to keep LDL < 100. .     Current medicines are reviewed at length with the patient today.  The patient concerns regarding his medicines were addressed.  The following changes have been made:  No change  Labs/ tests ordered today include:  No orders of the defined types were  placed in this encounter.    Recommend 150 minutes/week of aerobic exercise Tate fat, Tate carb, high fiber diet recommended  Disposition:   FU in post test   Phillip Tate., MD  10/11/2015 11:52 AM    [facility] [place], [place], [place]  [address] Phone: [phone]; Fax: [phone]

## 2015-10-15 ENCOUNTER — Encounter (HOSPITAL_COMMUNITY): Payer: Self-pay | Admitting: *Deleted

## 2015-10-23 ENCOUNTER — Encounter (HOSPITAL_COMMUNITY): Payer: 59

## 2015-10-23 ENCOUNTER — Encounter (HOSPITAL_COMMUNITY)
Admission: RE | Disposition: A | Discharge status: Home / Self Care | Payer: Self-pay | Source: Ambulatory Visit | Attending: Gastroenterology

## 2015-10-23 ENCOUNTER — Ambulatory Visit (HOSPITAL_COMMUNITY): Payer: 59 | Admitting: Certified Registered Nurse Anesthetist

## 2015-10-23 ENCOUNTER — Encounter (HOSPITAL_COMMUNITY): Payer: Self-pay

## 2015-10-23 ENCOUNTER — Ambulatory Visit (HOSPITAL_COMMUNITY)
Admission: RE | Admit: 2015-10-23 | Discharge: 2015-10-23 | Disposition: A | Discharge status: Home / Self Care | Payer: 59 | Source: Ambulatory Visit | Attending: Gastroenterology | Admitting: Gastroenterology

## 2015-10-23 DIAGNOSIS — J309 Allergic rhinitis, unspecified: Secondary | ICD-10-CM | POA: Insufficient documentation

## 2015-10-23 DIAGNOSIS — M1990 Unspecified osteoarthritis, unspecified site: Secondary | ICD-10-CM | POA: Diagnosis not present

## 2015-10-23 DIAGNOSIS — Z1211 Encounter for screening for malignant neoplasm of colon: Secondary | ICD-10-CM | POA: Insufficient documentation

## 2015-10-23 DIAGNOSIS — I2510 Atherosclerotic heart disease of native coronary artery without angina pectoris: Secondary | ICD-10-CM | POA: Insufficient documentation

## 2015-10-23 DIAGNOSIS — Z794 Long term (current) use of insulin: Secondary | ICD-10-CM | POA: Diagnosis not present

## 2015-10-23 DIAGNOSIS — E669 Obesity, unspecified: Secondary | ICD-10-CM | POA: Diagnosis not present

## 2015-10-23 DIAGNOSIS — Z7984 Long term (current) use of oral hypoglycemic drugs: Secondary | ICD-10-CM | POA: Insufficient documentation

## 2015-10-23 DIAGNOSIS — E119 Type 2 diabetes mellitus without complications: Secondary | ICD-10-CM | POA: Insufficient documentation

## 2015-10-23 DIAGNOSIS — Z6832 Body mass index (BMI) 32.0-32.9, adult: Secondary | ICD-10-CM | POA: Diagnosis not present

## 2015-10-23 DIAGNOSIS — D126 Benign neoplasm of colon, unspecified: Secondary | ICD-10-CM | POA: Insufficient documentation

## 2015-10-23 DIAGNOSIS — I10 Essential (primary) hypertension: Secondary | ICD-10-CM | POA: Insufficient documentation

## 2015-10-23 DIAGNOSIS — E7800 Pure hypercholesterolemia, unspecified: Secondary | ICD-10-CM | POA: Insufficient documentation

## 2015-10-23 DIAGNOSIS — Z85048 Personal history of other malignant neoplasm of rectum, rectosigmoid junction, and anus: Secondary | ICD-10-CM | POA: Diagnosis not present

## 2015-10-23 HISTORY — PX: COLONOSCOPY WITH PROPOFOL: SHX5780

## 2015-10-23 LAB — GLUCOSE, CAPILLARY: Glucose-Capillary: 124 mg/dL — ABNORMAL HIGH (ref 65–99)

## 2015-10-23 SURGERY — COLONOSCOPY WITH PROPOFOL
Anesthesia: Monitor Anesthesia Care

## 2015-10-23 MED ORDER — SODIUM CHLORIDE 0.9 % IV SOLN: INTRAVENOUS | Status: DC

## 2015-10-23 MED ORDER — PROPOFOL 10 MG/ML IV BOLUS
INTRAVENOUS | Status: AC
  Filled 2015-10-23: qty 20

## 2015-10-23 MED ORDER — PROPOFOL 10 MG/ML IV BOLUS
INTRAVENOUS | Status: DC | PRN
  Administered 2015-10-23: 10 mg via INTRAVENOUS
  Administered 2015-10-23: 70 mg via INTRAVENOUS
  Administered 2015-10-23 (×4): 30 mg via INTRAVENOUS

## 2015-10-23 MED ORDER — LACTATED RINGERS IV SOLN
INTRAVENOUS | Status: DC
  Administered 2015-10-23: 1000 mL via INTRAVENOUS

## 2015-10-23 SURGICAL SUPPLY — 22 items

## 2015-10-23 NOTE — H&P (Signed)
  Procedure: Surveillance colonoscopy. Rectal cancer surgery performed in 2006:T3N1M0 adenocarcinoma. 03/07/2010 normal esophagogastroduodenoscopy with small bowel biopsies and normal surveillance colonoscopy performed. 2008 normal surveillance colonoscopy performed  History: The patient is a 59 year old male born 19-Sep-1956. He is scheduled to undergo a surveillance colonoscopy today following removal of rectal adenocarcinoma in 2006.  Past medical history: Right cataract surgery performed in 2015. Rectal adenocarcinoma surgery performed in 2006. Type 2 diabetes mellitus. Hypertension. Hypercholesterolemia. Osteoarthritis. Allergic rhinitis. Coronary artery disease. Fatty appearing liver by CT scan.  Exam: The patient is alert and lying comfortably on the endoscopy stretcher. Abdomen is soft and nontender to palpation. Cardiac exam reveals a regular rhythm. Lungs are clear to auscultation.  Plan: Proceed with surveillance colonoscopy

## 2015-10-23 NOTE — Op Note (Signed)
Procedure: Surveillance colonoscopy. T3N1M0 rectal adenocarcinoma surgery performed in 2006. Normal surveillance colonoscopy performed in 2008. Normal esophagogastroduodenoscopy with small bowel biopsies and normal surveillance colonoscopy performed on 03/07/2010  Endoscopist: Earle Gell  Premedication: Propofol administered by anesthesia  Procedure: The patient was placed in the left lateral decubitus position. Anal inspection and digital rectal exam were normal. The Pentax pediatric colonoscope was introduced into the rectum and easily advanced to the cecum. A normal-appearing appendiceal orifice and ileocecal valve were identified. Colonic preparation for the exam today was good. Withdrawal time was 11 minutes  Rectum. Normal. Surgical anastomosis normal.  Sigmoid colon and descending colon. Normal  Splenic flexure. Normal  Transverse colon. Normal  Hepatic flexure. Normal  Ascending colon. A 3 mm sessile polyp was removed from the mid ascending colon with the cold biopsy forceps  Cecum and ileocecal valve. Normal  Assessment: A diminutive polyp was removed from the ascending colon. Otherwise normal surveillance colonoscopy.  Recommendation: Schedule repeat colonoscopy in 5 years

## 2015-10-23 NOTE — Transfer of Care (Signed)
Immediate Anesthesia Transfer of Care Note  Patient: Phillip Tate  Procedure(s) Performed: Procedure(s): COLONOSCOPY WITH PROPOFOL (N/A)  Patient Location: PACU  Anesthesia Type:MAC  Level of Consciousness: awake, alert  and oriented  Airway & Oxygen Therapy: Patient Spontanous Breathing and Patient connected to face mask oxygen  Post-op Assessment: Report given to RN and Post -op Vital signs reviewed and stable  Post vital signs: Reviewed and stable  Last Vitals:  Filed Vitals:   10/23/15 0745  BP: 147/69  Pulse: 69  Temp: 36.7 C  Resp: 12    Complications: No apparent anesthesia complications

## 2015-10-23 NOTE — Discharge Instructions (Signed)

## 2015-10-23 NOTE — Anesthesia Preprocedure Evaluation (Signed)
Anesthesia Evaluation  Patient identified by MRN, date of birth, ID band Patient awake    Reviewed: Allergy & Precautions, NPO status , Patient's Chart, lab work & pertinent test results  Airway Mallampati: II  TM Distance: >3 FB Neck ROM: Full    Dental no notable dental hx.    Pulmonary neg pulmonary ROS,    Pulmonary exam normal breath sounds clear to auscultation       Cardiovascular hypertension, negative cardio ROS Normal cardiovascular exam Rhythm:Regular Rate:Normal     Neuro/Psych negative neurological ROS  negative psych ROS   GI/Hepatic negative GI ROS, Neg liver ROS,   Endo/Other  diabetes, Type 2, Insulin Dependent, Oral Hypoglycemic Agents  Renal/GU negative Renal ROS  negative genitourinary   Musculoskeletal  (+) Arthritis ,   Abdominal (+) + obese,   Peds negative pediatric ROS (+)  Hematology  (+) anemia ,   Anesthesia Other Findings   Reproductive/Obstetrics negative OB ROS                             Anesthesia Physical Anesthesia Plan  ASA: III  Anesthesia Plan: MAC   Post-op Pain Management:    Induction: Intravenous  Airway Management Planned: Natural Airway  Additional Equipment:   Intra-op Plan:   Post-operative Plan:   Informed Consent: I have reviewed the patients History and Physical, chart, labs and discussed the procedure including the risks, benefits and alternatives for the proposed anesthesia with the patient or authorized representative who has indicated his/her understanding and acceptance.   Dental advisory given  Plan Discussed with: CRNA  Anesthesia Plan Comments:         Anesthesia Quick Evaluation

## 2015-10-23 NOTE — Anesthesia Postprocedure Evaluation (Signed)
  Anesthesia Post-op Note  Patient: Phillip Tate  Procedure(s) Performed: Procedure(s) (LRB): COLONOSCOPY WITH PROPOFOL (N/A)  Patient Location: PACU  Anesthesia Type: MAC  Level of Consciousness: awake and alert   Airway and Oxygen Therapy: Patient Spontanous Breathing  Post-op Pain: mild  Post-op Assessment: Post-op Vital signs reviewed, Patient's Cardiovascular Status Stable, Respiratory Function Stable, Patent Airway and No signs of Nausea or vomiting  Last Vitals:  Filed Vitals:   10/23/15 0920  BP: 114/68  Pulse: 66  Temp:   Resp: 19    Post-op Vital Signs: stable   Complications: No apparent anesthesia complications

## 2015-10-24 ENCOUNTER — Encounter (HOSPITAL_COMMUNITY): Payer: Self-pay | Admitting: Gastroenterology

## 2015-10-24 ENCOUNTER — Telehealth (HOSPITAL_COMMUNITY): Payer: Self-pay | Admitting: *Deleted

## 2015-10-24 NOTE — Telephone Encounter (Signed)
Attempted to call patient regarding upcoming test- no answer. Hubbard Robinson, RN

## 2015-10-25 ENCOUNTER — Telehealth (HOSPITAL_COMMUNITY): Payer: Self-pay | Admitting: *Deleted

## 2015-10-25 NOTE — Telephone Encounter (Signed)
Patient given detailed instructions per Myocardial Perfusion Study Information Sheet for the test on 10/30/15 at 1000. Patient notified to arrive 15 minutes early and that it is imperative to arrive on time for appointment to keep from having the test rescheduled.  If you need to cancel or reschedule your appointment, please call the office within 24 hours of your appointment. Failure to do so may result in a cancellation of your appointment, and a $50 no show fee. Patient verbalized understanding.Jeannifer Drakeford J Diamond Martucci, RN  

## 2015-10-30 ENCOUNTER — Ambulatory Visit (HOSPITAL_COMMUNITY): Payer: 59 | Attending: Cardiovascular Disease

## 2015-10-30 DIAGNOSIS — R0789 Other chest pain: Secondary | ICD-10-CM

## 2015-10-30 DIAGNOSIS — I10 Essential (primary) hypertension: Secondary | ICD-10-CM | POA: Insufficient documentation

## 2015-10-30 DIAGNOSIS — R0609 Other forms of dyspnea: Secondary | ICD-10-CM | POA: Insufficient documentation

## 2015-10-30 DIAGNOSIS — E119 Type 2 diabetes mellitus without complications: Secondary | ICD-10-CM | POA: Insufficient documentation

## 2015-10-30 LAB — MYOCARDIAL PERFUSION IMAGING
Estimated workload: 13.7 METS
Exercise duration (min): 12 min
Exercise duration (sec): 0 s
LV dias vol: 105 mL
LV sys vol: 41 mL
MPHR: 161 {beats}/min
Peak HR: 148 {beats}/min
Percent HR: 91 %
Percent of predicted max HR: 91 %
RATE: 0.32
Rest HR: 68 {beats}/min
SDS: 0
SRS: 0
SSS: 0
Stage 1 DBP: 88 mmHg
Stage 1 Grade: 0 %
Stage 1 HR: 70 {beats}/min
Stage 1 SBP: 139 mmHg
Stage 1 Speed: 0 mph
Stage 10 DBP: 95 mmHg
Stage 10 Grade: 0 %
Stage 10 HR: 100 {beats}/min
Stage 10 SBP: 192 mmHg
Stage 10 Speed: 0 mph
Stage 2 DBP: 85 mmHg
Stage 2 Grade: 0 %
Stage 2 HR: 72 {beats}/min
Stage 2 SBP: 135 mmHg
Stage 2 Speed: 0 mph
Stage 3 Grade: 0.1 %
Stage 3 HR: 72 {beats}/min
Stage 3 Speed: 0 mph
Stage 4 DBP: 70 mmHg
Stage 4 Grade: 10 %
Stage 4 HR: 86 {beats}/min
Stage 4 SBP: 144 mmHg
Stage 4 Speed: 1.7 mph
Stage 5 DBP: 71 mmHg
Stage 5 Grade: 12 %
Stage 5 HR: 97 {beats}/min
Stage 5 SBP: 135 mmHg
Stage 5 Speed: 2.5 mph
Stage 6 DBP: 71 mmHg
Stage 6 Grade: 14 %
Stage 6 HR: 118 {beats}/min
Stage 6 SBP: 157 mmHg
Stage 6 Speed: 3.4 mph
Stage 7 DBP: 96 mmHg
Stage 7 Grade: 16 %
Stage 7 HR: 146 {beats}/min
Stage 7 SBP: 164 mmHg
Stage 7 Speed: 4.3 mph
Stage 8 Grade: 16 %
Stage 8 HR: 148 {beats}/min
Stage 8 Speed: 4.1 mph
Stage 9 DBP: 89 mmHg
Stage 9 Grade: 0 %
Stage 9 HR: 130 {beats}/min
Stage 9 SBP: 181 mmHg
Stage 9 Speed: 0 mph
TID: 0.98

## 2015-10-30 MED ORDER — TECHNETIUM TC 99M SESTAMIBI GENERIC - CARDIOLITE
30.6000 | Freq: Once | INTRAVENOUS | Status: AC | PRN
  Administered 2015-10-30: 31 via INTRAVENOUS

## 2015-10-30 MED ORDER — TECHNETIUM TC 99M SESTAMIBI GENERIC - CARDIOLITE
10.7000 | Freq: Once | INTRAVENOUS | Status: AC | PRN
  Administered 2015-10-30: 11 via INTRAVENOUS

## 2016-01-17 ENCOUNTER — Encounter (HOSPITAL_COMMUNITY)
Admission: RE | Admit: 2016-01-17 | Discharge: 2016-01-17 | Disposition: A | Discharge status: Home / Self Care | Payer: BLUE CROSS/BLUE SHIELD | Source: Ambulatory Visit | Attending: Internal Medicine | Admitting: Internal Medicine

## 2016-01-17 ENCOUNTER — Encounter (HOSPITAL_COMMUNITY): Payer: Self-pay

## 2016-01-17 DIAGNOSIS — D509 Iron deficiency anemia, unspecified: Secondary | ICD-10-CM | POA: Diagnosis present

## 2016-01-17 HISTORY — DX: Personal history of other infectious and parasitic diseases: Z86.19

## 2016-01-17 MED ORDER — SODIUM CHLORIDE 0.9 % IV SOLN
Freq: Once | INTRAVENOUS | Status: AC
  Administered 2016-01-17: 08:00:00 via INTRAVENOUS

## 2016-01-17 MED ORDER — SODIUM CHLORIDE 0.9 % IV SOLN
510.0000 mg | Freq: Once | INTRAVENOUS | Status: AC
  Administered 2016-01-17: 510 mg via INTRAVENOUS
  Filled 2016-01-17: qty 17

## 2016-01-17 NOTE — Progress Notes (Signed)
Tolerated feraheme w/o immediate rxn. Observation x 30 min post infusion, as suggested

## 2016-01-17 NOTE — Discharge Instructions (Signed)
Ferumoxytol injection What is this medicine? FERUMOXYTOL is an iron complex. Iron is used to make healthy red blood cells, which carry oxygen and nutrients throughout the body. This medicine is used to treat iron deficiency anemia in people with chronic kidney disease.  What should I tell my health care provider before I take this medicine? They need to know if you have any of these conditions: -anemia not caused by low iron levels -high levels of iron in the blood -magnetic resonance imaging (MRI) test scheduled -an unusual or allergic reaction to iron, other medicines, foods, dyes, or preservatives -pregnant or trying to get pregnant -breast-feeding How should I use this medicine? This medicine is for injection into a vein. It is given by a health care professional in a hospital or clinic setting. Talk to your pediatrician regarding the use of this medicine in children. Special care may be needed. Overdosage: If you think you have taken too much of this medicine contact a poison control center or emergency room at once. NOTE: This medicine is only for you. Do not share this medicine with others.   What should I watch for while using this medicine? Visit your doctor or healthcare professional regularly. Tell your doctor or healthcare professional if your symptoms do not start to get better or if they get worse. You may need blood work done while you are taking this medicine. You may need to follow a special diet. Talk to your doctor. Foods that contain iron include: whole grains/cereals, dried fruits, beans, or peas, leafy green vegetables, and organ meats (liver, kidney). What side effects may I notice from receiving this medicine? Side effects that you should report to your doctor or health care professional as soon as possible: -allergic reactions like skin rash, itching or hives, swelling of the face, lips, or tongue -breathing problems -changes in blood pressure -feeling faint or  lightheaded, falls -fever or chills -flushing, sweating, or hot feelings -swelling of the ankles or feet Side effects that usually do not require medical attention (Report these to your doctor or health care professional if they continue or are bothersome.): -diarrhea -headache -nausea, vomiting -stomach pain This list may not describe all possible side effects. Call your doctor for medical advice about side effects. You may report side effects to FDA at 1-800-FDA-1088. Where should I keep my medicine? This drug is given in a hospital or clinic and will not be stored at home. NOTE: This sheet is a summary. It may not cover all possible information. If you have questions about this medicine, talk to your doctor, pharmacist, or health care provider.    2016, Elsevier/Gold Standard. (2012-07-30 15:23:36)

## 2016-05-19 ENCOUNTER — Ambulatory Visit
Admission: RE | Admit: 2016-05-19 | Discharge: 2016-05-19 | Disposition: A | Discharge status: Home / Self Care | Payer: BLUE CROSS/BLUE SHIELD | Source: Ambulatory Visit | Attending: Internal Medicine | Admitting: Internal Medicine

## 2016-05-19 ENCOUNTER — Other Ambulatory Visit: Payer: Self-pay | Admitting: Internal Medicine

## 2016-05-19 DIAGNOSIS — M5489 Other dorsalgia: Secondary | ICD-10-CM

## 2016-05-20 ENCOUNTER — Encounter: Payer: Self-pay | Admitting: Physical Therapy

## 2016-05-20 ENCOUNTER — Ambulatory Visit: Payer: BLUE CROSS/BLUE SHIELD | Attending: Internal Medicine | Admitting: Physical Therapy

## 2016-05-20 DIAGNOSIS — M6281 Muscle weakness (generalized): Secondary | ICD-10-CM | POA: Diagnosis present

## 2016-05-20 DIAGNOSIS — M542 Cervicalgia: Secondary | ICD-10-CM | POA: Diagnosis present

## 2016-05-20 DIAGNOSIS — M25521 Pain in right elbow: Secondary | ICD-10-CM | POA: Diagnosis present

## 2016-05-20 DIAGNOSIS — M25511 Pain in right shoulder: Secondary | ICD-10-CM

## 2016-05-20 NOTE — Therapy (Signed)
[place], [place], [address] Phone: [phone]   Fax:  [phone]  Physical Therapy Evaluation  Patient Details  Name: Phillip Tate MRN: [id number] Date of Birth: [birth year] Referring Provider: Darden Palmer, MD  Encounter Date: 05/20/2016      PT End of Session - 05/20/16 1414    Visit Number 1   Number of Visits 13   Date for PT Re-Evaluation 07/04/16   Authorization Type [employer]   Authorization - Visit Number 30   PT Start Time 1330   PT Stop Time 1427   PT Time Calculation (min) 57 min   Activity Tolerance Patient tolerated treatment well   Behavior During Therapy [facility] for tasks assessed/performed      Past Medical History  Diagnosis Date  . Hypertension   . Hyperlipidemia   . Coronary artery disease   . Organic impotence   . History of rectal cancer     dx 2006 --  Stage III, T3  N1  M0,  s/p  chemoradiation (07-10-2005 to 09-05-2005) and anterior colon resection 11-04-2006  . DDD (degenerative disc disease), lumbar   . Iron deficiency anemia     intolerent PO iron--  gets IV iron,  last one 03-12-2015 ([facility])  . Diabetes mellitus without complication ([place])   . Dyslipidemia   . Rectal cancer (HCC)     adenocarcinoma stage lll  . History of shingles     Past Surgical History  Procedure Laterality Date  . Penile prosthesis placement  10-29-2009  . Port a cath placement  07-23-2005  . Anterior colon resection w/ colostomy  11-04-2006  . Colostomy takedown  june 2007  . Colonoscopy with esophagogastroduodenoscopy (egd)  last one 03-07-2010  . Cataract extraction w/ intraocular lens implant Right sept 2015  &  feb 2016  . Examination under anesthesia N/A 04/23/2015    Procedure: EXAM UNDER ANESTHESIA, MANIPULATION OF PENILE PROSTHESIS PUMP;  Surgeon: Kathie Rhodes, MD;  Location: [facility];  Service: Urology;  Laterality: N/A;  . Colonoscopy with propofol N/A  10/23/2015    Procedure: COLONOSCOPY WITH PROPOFOL;  Surgeon: Garlan Fair, MD;  Location: [facility];  Service: Endoscopy;  Laterality: N/A;    There were no vitals filed for this visit.       Subjective Assessment - 05/20/16 1332    Subjective R periscapular pain when standing and laying began last Monday. Contacted massage therapist which did not decrease pain. Began feeling pain into R elbow into forarm. Now, no pain when laying down and severe when standing. MD reported periscap and UE pain is nerve related. N/T into hand. Unable to make fist with 1st and 2nd digits. LBP chronic- chemo and radiation. Discomfort in low back and restelss legs.    Limitations Sitting;Standing;Lifting;Walking;Writing;House hold activities   How long can you sit comfortably? 10 min   How long can you stand comfortably? 5 min   Patient Stated Goals work in garden, [employer] operational officer fist, sit to eat dinner, work at [employer].    Currently in Pain? Yes   Pain Score 5    Pain Location Shoulder   Pain Orientation Right   Pain Descriptors / Indicators Stabbing   Pain Type Acute pain   Pain Radiating Towards R hand   Pain Frequency Intermittent   Aggravating Factors  standing, sitting without back support   Pain Relieving Factors laying, holding head in correct position.  [facility] PT Assessment - 05/20/16 0001    Assessment   Medical Diagnosis back pain   Referring Provider Darden Palmer, MD   Onset Date/Surgical Date 05/12/16   Hand Dominance Right   Next MD Visit not at this time   Prior Therapy no   Precautions   Precautions None   Restrictions   Weight Bearing Restrictions No   Balance Screen   Has the patient fallen in the past 6 months No   Prior Function   Vocation Full time employment   Observation/Other Assessments   Focus on Therapeutic Outcomes (FOTO)  60% disability   Posture/Postural Control   Posture Comments slightly kyphotic with elevated shoulders bilat   ROM  / Strength   AROM / PROM / Strength AROM;Strength   AROM   Overall AROM Comments WNL   Strength   Strength Assessment Site Shoulder;Hand   Right/Left Shoulder Right   Right Shoulder External Rotation 4-/5  accompanied by GHJ elevation   Right/Left hand Right;Left   Right Hand Grip (lbs) 60   Left Hand Grip (lbs) 80   Palpation   Palpation comment limited mobility in lower cervical and upper thoracic spine as well as rib cage, negative nerve tension tests for median, ulnar and radial.                    [facility] Adult PT Treatment/Exercise - 05/20/16 0001    Modalities   Modalities Traction   Traction   Type of Traction Cervical   Min (lbs) 5   Max (lbs) 20   Hold Time 60s   Rest Time 10s   Time 15 min                PT Education - 05/20/16 1524    Education provided Yes   Education Details anatomy of condition, POC, HEP, traction   Person(s) Educated Patient   Methods Explanation;Demonstration;Tactile cues;Verbal cues   Comprehension Verbalized understanding;Returned demonstration;Verbal cues required;Tactile cues required;Need further instruction          PT Short Term Goals - 05/20/16 1538    PT SHORT TERM GOAL #1   Title pt will verbalize pain <=6/10 after sitting for 15 mins by 6/16   Time 3   Period Weeks   Status New   PT SHORT TERM GOAL #2   Title independent in HEP as it has been established.   Time 3   Period Weeks   Status New   PT SHORT TERM GOAL #3   Title verbalized improved ability to sit at dinner table to eat   Time 3   Period Weeks   Status New           PT Long Term Goals - 05/20/16 1540    PT LONG TERM GOAL #1   Title Pt will be able to sit for at least 30 min pain <=2/10 by 07/04/16   Time 6   Period Weeks   Status New   PT LONG TERM GOAL #2   Title Pt will be able to perform garden work pain <=2/10   Time 6   Period Weeks   Status New   PT LONG TERM GOAL #3   Title Pt will be able to write without grip  limitations.    Time 6   Period Weeks   Status New   PT LONG TERM GOAL #4   Title Grip strength within 5lb R to L   Time 6  Period Weeks   Status New   PT LONG TERM GOAL #5   Title External rotation strength 5/5   Time 6   Period Weeks   Status New               Plan - 05/20/16 1524    Clinical Impression Statement Pt presents today with complaints of periscapular pain and radicular pain into right upper extremity. Pt did not complain of pain with cervical or shoulder ROM both actively and passively and all were within normal limits. Placed patient on cervical traction today which he reported decreased his symptoms and would like to increase pull intensity next visit. Pt will benefit from skilled PT in order to decrease radicular pain and strengthen periscapular musculature to provide support to cervical and thoracic spine.  Pt also reported chronic back pain that he believes to be as result of chemo/radiation in the past; will benefit from core training and strengthening to improve lumbar support.    Rehab Potential Good   Clinical Impairments Affecting Rehab Potential chronic LBP, lumbar DDD, DM   PT Frequency 2x / week   PT Duration 6 weeks   PT Treatment/Interventions ADLs/Self Care Home Management;Cryotherapy;Ultrasound;Traction;Moist Heat;Gait training;Functional mobility training;Therapeutic activities;Therapeutic exercise;Balance training;Patient/family education;Neuromuscular re-education;Manual techniques;Taping;Dry needling;Passive range of motion   PT Next Visit Plan evaluate effectiveness of traction, periscapular strengthening   PT Home Exercise Plan postural awareness, will see how traction effects pain   Consulted and Agree with Plan of Care Patient      Patient will benefit from skilled therapeutic intervention in order to improve the following deficits and impairments:  Pain, Postural dysfunction, Decreased strength, Impaired UE functional use, Decreased  mobility  Visit Diagnosis: Cervicalgia - Plan: PT plan of care cert/re-cert  Pain in right shoulder - Plan: PT plan of care cert/re-cert  Pain in right elbow - Plan: PT plan of care cert/re-cert  Muscle weakness (generalized) - Plan: PT plan of care cert/re-cert     Problem List Patient Active Problem List   Diagnosis Date Noted  . Diabetes mellitus, type 2 ([place]) 10/11/2015    Etheline Geppert C. Lucie Friedlander PT, DPT 05/20/2016 3:44 PM   [facility] [address] [place], [place], [address] Phone: [phone]   Fax:  [phone]  Name: Phillip Tate MRN: [id number] Date of Birth: 10/02/1956

## 2016-05-22 ENCOUNTER — Ambulatory Visit: Payer: BLUE CROSS/BLUE SHIELD | Admitting: Physical Therapy

## 2016-05-22 DIAGNOSIS — M25521 Pain in right elbow: Secondary | ICD-10-CM

## 2016-05-22 DIAGNOSIS — M25511 Pain in right shoulder: Secondary | ICD-10-CM

## 2016-05-22 DIAGNOSIS — M542 Cervicalgia: Secondary | ICD-10-CM | POA: Diagnosis not present

## 2016-05-22 DIAGNOSIS — M6281 Muscle weakness (generalized): Secondary | ICD-10-CM

## 2016-05-22 NOTE — Patient Instructions (Signed)
Levator Stretch   Grasp seat or sit on hand on side to be stretched. Turn head toward other side and look down. Use hand on head to gently stretch neck in that position. Hold _30___ seconds. Repeat on other side. Repeat __3__ times. Do ___2_ sessions per day.  http://[email or web address].exer.us/30   Copyright  VHI. All rights reserved.  Side-Bending   One hand on opposite side of head, pull head to side as far as is comfortable. Stop if there is pain. Hold __30__ seconds. Repeat with other hand to other side. Repeat ___2-3_ times. Do __2__ sessions per day.   Copyright  VHI. All rights reserved.  Scapular Retraction (Standing)   With arms at sides, pinch shoulder blades together. Repeat __10__ times per set. Do __2__ sets per session. Do __2__ sessions per day.  http://orth.exer.us/944   Resisted Horizontal Abduction: Bilateral    Sit or stand, tubing in both hands, arms out in front. Keeping arms straight, pinch shoulder blades together and stretch arms out. Repeat ___10_ times per set. Do __2__ sets per session. Do __2__ sessions per day.  RED BAND   http://orth.exer.us/968   Copyright  VHI. All rights reserved.   Axial Extension (Chin Tuck)    Pull chin in and lengthen back of neck. Hold _5 ___ seconds while counting out loud. Repeat __10__ times. Do ___2-3_ sessions per day.  http://[email or web address].exer.us/449   Copyright  VHI. All rights reserved.

## 2016-05-22 NOTE — Therapy (Signed)
Phillip Tate, [place], [address] Phone: [phone]   Fax:  [phone]  Physical Therapy Treatment  Patient Details  Name: Phillip Tate MRN: [id number] Date of Birth: 09-Jun-1956 Referring Provider: Darden Palmer, MD  Encounter Date: 05/22/2016      PT End of Session - 05/22/16 1136    Visit Number 2   Number of Visits 13   Date for PT Re-Evaluation 07/04/16   PT Start Time 1028  15 min late   PT Stop Time 1130   PT Time Calculation (min) 62 min   Activity Tolerance Patient tolerated treatment well   Behavior During Therapy Phillip Tate for tasks assessed/performed      Past Medical History  Diagnosis Date  . Hypertension   . Hyperlipidemia   . Coronary artery disease   . Organic impotence   . History of rectal cancer     dx 2006 --  Stage III, T3  N1  M0,  s/p  chemoradiation (07-10-2005 to 09-05-2005) and anterior colon resection 11-04-2006  . DDD (degenerative disc disease), lumbar   . Iron deficiency anemia     intolerent PO iron--  gets IV iron,  last one 03-12-2015 ([facility])  . Diabetes mellitus without complication ([place])   . Dyslipidemia   . Rectal cancer (HCC)     adenocarcinoma stage lll  . History of shingles     Past Surgical History  Procedure Laterality Date  . Penile prosthesis placement  10-29-2009  . Port a cath placement  07-23-2005  . Anterior colon resection w/ colostomy  11-04-2006  . Colostomy takedown  june 2007  . Colonoscopy with esophagogastroduodenoscopy (egd)  last one 03-07-2010  . Cataract extraction w/ intraocular lens implant Right sept 2015  &  feb 2016  . Examination under anesthesia N/A 04/23/2015    Procedure: EXAM UNDER ANESTHESIA, MANIPULATION OF PENILE PROSTHESIS PUMP;  Surgeon: Phillip Rhodes, MD;  Location: [facility];  Service: Urology;  Laterality: N/A;  . Colonoscopy with propofol N/A 10/23/2015    Procedure: COLONOSCOPY WITH  PROPOFOL;  Surgeon: Phillip Fair, MD;  [facility];  Service: Endoscopy;  Laterality: N/A;    There were no vitals filed for this visit.      Subjective Assessment - 05/22/16 1034    Subjective Pain is intermittent .  Thinks traction helped him. Was able to cut roses this AM and used Rt. hand.     Currently in Pain? Yes   Pain Score 7    Pain Location Scapula   Pain Orientation Right   Pain Type Acute pain   Pain Onset 1 to 4 weeks ago   Pain Frequency Intermittent           [facility] Adult PT Treatment/Exercise - 05/22/16 1043    Self-Care   Self-Care Posture;Other Self-Care Comments   Posture forward head position, DDD   Other Self-Care Comments  decompression of spine, anatomy   Neck Exercises: Theraband   Scapula Retraction 15 reps   Scapula Retraction Limitations no band supine    Horizontal ABduction 10 reps   Neck Exercises: Supine   Neck Retraction 15 reps   Traction   Type of Traction Cervical   Min (lbs) 8   Max (lbs) 22   Hold Time 60s   Rest Time 10s   Time 15 min  total time approx. 25 min due to need to reposition    Neck Exercises: Stretches   Upper  Trapezius Stretch 3 reps;30 seconds   Levator Stretch 3 reps;30 seconds                PT Education - 05/22/16 1219    Education provided Yes   Education Details disc anatomy, nerve involvement and pain referral from C spine , HEP for stab    Person(s) Educated Patient   Methods Explanation;Demonstration;Handout   Comprehension Returned demonstration;Verbalized understanding          PT Short Term Goals - 05/20/16 1538    PT SHORT TERM GOAL #1   Title pt will verbalize pain <=6/10 after sitting for 15 mins by 6/16   Time 3   Period Weeks   Status New   PT SHORT TERM GOAL #2   Title independent in HEP as it has been established.   Time 3   Period Weeks   Status New   PT SHORT TERM GOAL #3   Title verbalized improved ability to sit at dinner table to eat   Time 3    Period Weeks   Status New           PT Long Term Goals - 05/20/16 1540    PT LONG TERM GOAL #1   Title Pt will be able to sit for at least 30 min pain <=2/10 by 07/04/16   Time 6   Period Weeks   Status New   PT LONG TERM GOAL #2   Title Pt will be able to perform garden work pain <=2/10   Time 6   Period Weeks   Status New   PT LONG TERM GOAL #3   Title Pt will be able to write without grip limitations.    Time 6   Period Weeks   Status New   PT LONG TERM GOAL #4   Title Grip strength within 5lb R to L   Time 6   Period Weeks   Status New   PT LONG TERM GOAL #5   Title External rotation strength 5/5   Time 6   Period Weeks   Status New               Plan - 05/22/16 1221    Clinical Impression Statement Patient cont with pain in Rt. scapula (superior angle) and Rt. hand sensory symptoms.  He liked traction, needed readjustment for neck positioning.  Has HEP to begin stabilization and posture correction.    PT Next Visit Plan evaluate effectiveness of traction, periscapular strengthening ,cerv/scap stabilization    PT Home Exercise Plan neck tension stretching, chin tuck, scap retraction    Consulted and Agree with Plan of Care Patient      Patient will benefit from skilled therapeutic intervention in order to improve the following deficits and impairments:  Pain, Postural dysfunction, Decreased strength, Impaired UE functional use, Decreased mobility  Visit Diagnosis: Cervicalgia  Pain in right shoulder  Pain in right elbow  Muscle weakness (generalized)     Problem List Patient Active Problem List   Diagnosis Date Noted  . Diabetes mellitus, type 2 ([place]) 10/11/2015    Phillip Tate 05/22/2016, 12:31 PM  Phillip Tate [address] [place], [place], [address] Phone: [phone]   Fax:  [phone]  Name: Phillip Tate MRN: [id number] Date of Birth: Jun 19, 1956    Phillip Tate,  PT 05/22/2016 12:31 PM Phone: [phone] Fax: [phone]

## 2016-05-27 ENCOUNTER — Ambulatory Visit: Payer: BLUE CROSS/BLUE SHIELD | Admitting: Physical Therapy

## 2016-05-27 DIAGNOSIS — M6281 Muscle weakness (generalized): Secondary | ICD-10-CM

## 2016-05-27 DIAGNOSIS — M25511 Pain in right shoulder: Secondary | ICD-10-CM

## 2016-05-27 DIAGNOSIS — M542 Cervicalgia: Secondary | ICD-10-CM

## 2016-05-27 DIAGNOSIS — M25521 Pain in right elbow: Secondary | ICD-10-CM

## 2016-05-27 NOTE — Therapy (Signed)
[place], [place], [address] Phone: [phone]   Fax:  [phone]  Physical Therapy Treatment  Patient Details  Name: Phillip Tate MRN: [id number] Date of Birth: 1956/04/26 Referring Provider: Darden Palmer, MD  Encounter Date: 05/27/2016      PT End of Session - 05/27/16 1148    Visit Number 3   Number of Visits 13   Date for PT Re-Evaluation 07/04/16   PT Start Time 1105   PT Stop Time 1147   PT Time Calculation (min) 42 min   Activity Tolerance Patient tolerated treatment well   Behavior During Therapy [facility] for tasks assessed/performed      Past Medical History  Diagnosis Date  . Hypertension   . Hyperlipidemia   . Coronary artery disease   . Organic impotence   . History of rectal cancer     dx 2006 --  Stage III, T3  N1  M0,  s/p  chemoradiation (07-10-2005 to 09-05-2005) and anterior colon resection 11-04-2006  . DDD (degenerative disc disease), lumbar   . Iron deficiency anemia     intolerent PO iron--  gets IV iron,  last one 03-12-2015 ([facility])  . Diabetes mellitus without complication ([place])   . Dyslipidemia   . Rectal cancer (HCC)     adenocarcinoma stage lll  . History of shingles     Past Surgical History  Procedure Laterality Date  . Penile prosthesis placement  10-29-2009  . Port a cath placement  07-23-2005  . Anterior colon resection w/ colostomy  11-04-2006  . Colostomy takedown  june 2007  . Colonoscopy with esophagogastroduodenoscopy (egd)  last one 03-07-2010  . Cataract extraction w/ intraocular lens implant Right sept 2015  &  feb 2016  . Examination under anesthesia N/A 04/23/2015    Procedure: EXAM UNDER ANESTHESIA, MANIPULATION OF PENILE PROSTHESIS PUMP;  Surgeon: Kathie Rhodes, MD;  Location: [facility];  Service: Urology;  Laterality: N/A;  . Colonoscopy with propofol N/A 10/23/2015    Procedure: COLONOSCOPY WITH PROPOFOL;  Surgeon:  Garlan Fair, MD;  [facility];  Service: Endoscopy;  Laterality: N/A;    There were no vitals filed for this visit.      Subjective Assessment - 05/27/16 1111    Subjective Pt asking many questions about his diagnosis, the goal of PT, will this help me?, "why am I coming here?"   Had to take Tyleonol and Ibuprofen yesterday.  Was hurting really bad after traction last time. Feels his grip is better but still has trouble twith digits 2-3.     Currently in Pain? No/denies            [facility] PT Assessment - 05/27/16 1115    Strength   Right Hand Grip (lbs) 60 able to get 80 after many trials   feels stronger today despite results   Left Hand Grip (lbs) 74            [facility] Adult PT Treatment/Exercise - 05/27/16 1119    Self-Care   Posture forward head position, DDD   Other Self-Care Comments  PT and impact on function   Neck Exercises: Theraband   Shoulder Extension 20 reps;Blue   Rows 20 reps;Blue   Horizontal ABduction 10 reps;Blue   Horizontal ABduction Limitations wall, max cues to correct   Other Theraband Exercises lat pull down against wall blue band    Manual Therapy   Manual Therapy Soft tissue mobilization;Myofascial  release;Passive ROM;Manual Traction   Soft tissue mobilization Rt. levator scap, rhomboids and upper trap   Myofascial Release upper back, neck    Passive ROM rotation and lateral flexion    Manual Traction 5 trials cervical spine                 PT Education - 05/27/16 1257    Education provided Yes   Education Details DDD, posture, trigger points   Person(s) Educated Patient   Methods Explanation   Comprehension Verbalized understanding          PT Short Term Goals - 05/27/16 1301    PT SHORT TERM GOAL #1   Title pt will verbalize pain <=6/10 after sitting for 15 mins by 6/16   Status On-going   PT SHORT TERM GOAL #2   Title independent in HEP as it has been established.   Status On-going   PT SHORT TERM GOAL  #3   Title verbalized improved ability to sit at dinner table to eat   Status On-going           PT Long Term Goals - 05/27/16 1301    PT LONG TERM GOAL #1   Title Pt will be able to sit for at least 30 min pain <=2/10 by 07/04/16   Status On-going   PT LONG TERM GOAL #2   Title Pt will be able to perform garden work pain <=2/10   Status On-going   PT LONG TERM GOAL #3   Title Pt will be able to write without grip limitations.    Status On-going   PT LONG TERM GOAL #4   Title Grip strength within 5lb R to L   Status On-going   PT LONG TERM GOAL #5   Title External rotation strength 5/5   Status On-going               Plan - 05/27/16 1258    Clinical Impression Statement PT has intermittent pain at the top of scapular, no neck pain, really.  This is confusing to him and he needs reinforcement.  Unable to align spine and head against wall due to tightness. Performed manual to ease pain and provide decompression to C spine.    PT Next Visit Plan postural strength, try PIlates for core and scap   PT Home Exercise Plan neck tension stretching, chin tuck, scap retraction    Consulted and Agree with Plan of Care Patient      Patient will benefit from skilled therapeutic intervention in order to improve the following deficits and impairments:  Pain, Postural dysfunction, Decreased strength, Impaired UE functional use, Decreased mobility  Visit Diagnosis: Cervicalgia  Pain in right shoulder  Pain in right elbow  Muscle weakness (generalized)     Problem List Patient Active Problem List   Diagnosis Date Noted  . Diabetes mellitus, type 2 ([place]) 10/11/2015    PAA,JENNIFER 05/27/2016, 1:12 PM  [facility] [address] [place], [place], [address] Phone: [phone]   Fax:  [phone]  Name: Phillip Tate MRN: [id number] Date of Birth: 1956-10-04    [name], PT 05/27/2016 1:12 PM Phone:  [phone] Fax: [phone]

## 2016-05-29 ENCOUNTER — Ambulatory Visit: Payer: BLUE CROSS/BLUE SHIELD | Attending: Internal Medicine | Admitting: Physical Therapy

## 2016-05-29 DIAGNOSIS — M25521 Pain in right elbow: Secondary | ICD-10-CM | POA: Diagnosis present

## 2016-05-29 DIAGNOSIS — M6281 Muscle weakness (generalized): Secondary | ICD-10-CM | POA: Diagnosis present

## 2016-05-29 DIAGNOSIS — M25511 Pain in right shoulder: Secondary | ICD-10-CM

## 2016-05-29 DIAGNOSIS — M542 Cervicalgia: Secondary | ICD-10-CM | POA: Diagnosis present

## 2016-05-29 NOTE — Therapy (Signed)
[place]ill[place], [place], [address] Phone: [phone]   Fax:  [phone]  Physical Therapy Treatment  Patient Details  Name: Phillip Tate MRN: [id number] Date of Birth: [birth year] Referring Provider: Darden Palmer, MD  Encounter Date: 05/29/2016      PT End of Session - 05/29/16 1152    Visit Number 4   Number of Visits 13   Date for PT Re-Evaluation 07/04/16   PT Start Time 1150   PT Stop Time [id number]   PT Time Calculation (min) 45 min   Activity Tolerance Patient tolerated treatment well   Behavior During Therapy [facility] for tasks assessed/performed      Past Medical History  Diagnosis Date  . Hypertension   . Hyperlipidemia   . Coronary artery disease   . Organic impotence   . History of rectal cancer     dx 2006 --  Stage III, T3  N1  M0,  s/p  chemoradiation (07-10-2005 to 09-05-2005) and anterior colon resection 11-04-2006  . DDD (degenerative disc disease), lumbar   . Iron deficiency anemia     intolerent PO iron--  gets IV iron,  last one 03-12-2015 ([facility])  . Diabetes mellitus without complication ([place])   . Dyslipidemia   . Rectal cancer (HCC)     adenocarcinoma stage lll  . History of shingles     Past Surgical History  Procedure Laterality Date  . Penile prosthesis placement  10-29-2009  . Port a cath placement  07-23-2005  . Anterior colon resection w/ colostomy  11-04-2006  . Colostomy takedown  june 2007  . Colonoscopy with esophagogastroduodenoscopy (egd)  last one 03-07-2010  . Cataract extraction w/ intraocular lens implant Right sept 2015  &  feb 2016  . Examination under anesthesia N/A 04/23/2015    Procedure: EXAM UNDER ANESTHESIA, MANIPULATION OF PENILE PROSTHESIS PUMP;  Surgeon: Kathie Rhodes, MD;  Location: [place];  Service: Urology;  Laterality: N/A;  . Colonoscopy with propofol N/A 10/23/2015    Procedure: COLONOSCOPY WITH PROPOFOL;  Surgeon:  Garlan Fair, MD;  [facility];  Service: Endoscopy;  Laterality: N/A;    There were no vitals filed for this visit.      Subjective Assessment - 05/29/16 1151    Subjective No pain the whole day after I left here.  Had a little bit yesterday because I worked in the yard.    Currently in Pain? No/denies          [facility] Adult PT Treatment/Exercise - 05/29/16 1157    Neck Exercises: Supine   Shoulder Flexion 20 reps   Upper Extremity D2 10 reps;Theraband   Theraband Level (UE D2) Level 3 (Green)   Other Supine Exercise cervical stabilization horiz abd green band and without band    Other Supine Exercise single arm 5 lbs horiz abd  lat pull down supine on foam roller 5lbs   Lumbar Exercises: Supine   Bent Knee Raise 10 reps   Dead Bug 20 reps   Dead Bug Limitations foam roller    Moist Heat Therapy   Number Minutes Moist Heat 10 Minutes   Moist Heat Location Cervical   Manual Therapy   Soft tissue mobilization compression to Rt. upper trap/levator and suboccipitals    Passive ROM rotation and lateral flexion                   PT Short Term Goals - 05/27/16 1301  PT SHORT TERM GOAL #1   Title pt will verbalize pain <=6/10 after sitting for 15 mins by 6/16   Status On-going   PT SHORT TERM GOAL #2   Title independent in HEP as it has been established.   Status On-going   PT SHORT TERM GOAL #3   Title verbalized improved ability to sit at dinner table to eat   Status On-going           PT Long Term Goals - 05/27/16 1301    PT LONG TERM GOAL #1   Title Pt will be able to sit for at least 30 min pain <=2/10 by 07/04/16   Status On-going   PT LONG TERM GOAL #2   Title Pt will be able to perform garden work pain <=2/10   Status On-going   PT LONG TERM GOAL #3   Title Pt will be able to write without grip limitations.    Status On-going   PT LONG TERM GOAL #4   Title Grip strength within 5lb R to L   Status On-going   PT LONG TERM GOAL #5    Title External rotation strength 5/5   Status On-going               Plan - 05/29/16 1215    Clinical Impression Statement Patient with favorable response to manual and stretching, worked on stabilization of cervical and lumbar spine with foam roller, needed min cues.  Progressing towards goals.    PT Next Visit Plan check specific goals, cont stab and consider manual work again if pain incr    PT Home Exercise Plan neck tension stretching, chin tuck, scap retraction    Consulted and Agree with Plan of Care Patient      Patient will benefit from skilled therapeutic intervention in order to improve the following deficits and impairments:  Pain, Postural dysfunction, Decreased strength, Impaired UE functional use, Decreased mobility  Visit Diagnosis: Cervicalgia  Pain in right shoulder  Pain in right elbow  Muscle weakness (generalized)     Problem List Patient Active Problem List   Diagnosis Date Noted  . Diabetes mellitus, type 2 ([place]) 10/11/2015    [name] 05/29/2016, 12:33 PM  [facility] [address] [place], [place], [address] Phone: [phone]   Fax:  [phone]  Name: Phillip Tate MRN: [id number] Date of Birth: Nov 25, 1956    [name], PT 05/29/2016 12:33 PM Phone: [phone] Fax: [phone]

## 2016-06-02 ENCOUNTER — Encounter: Payer: BLUE CROSS/BLUE SHIELD | Admitting: Physical Therapy

## 2016-06-03 ENCOUNTER — Ambulatory Visit: Payer: BLUE CROSS/BLUE SHIELD | Admitting: Physical Therapy

## 2016-06-03 DIAGNOSIS — M542 Cervicalgia: Secondary | ICD-10-CM | POA: Diagnosis not present

## 2016-06-03 DIAGNOSIS — M25511 Pain in right shoulder: Secondary | ICD-10-CM

## 2016-06-03 DIAGNOSIS — M6281 Muscle weakness (generalized): Secondary | ICD-10-CM

## 2016-06-03 DIAGNOSIS — M25521 Pain in right elbow: Secondary | ICD-10-CM

## 2016-06-03 NOTE — Patient Instructions (Signed)
Low Row: Standing    Face anchor, feet shoulder width apart. Palms up, pull arms back, squeezing shoulder blades together. Repeat _10-20_ times per set. Do _2_ sets per session. Do _3-5_ sessions per week. Anchor Height: Waist  http://tub.exer.[email or web address]   Copyright  VHI. All rights reserved.

## 2016-06-03 NOTE — Therapy (Signed)
[place], [place], [address] Phone: [phone]   Fax:  [phone]  Physical Therapy Treatment/Discharge  Patient Details  Name: Phillip Tate MRN: [id number] Date of Birth: 10/22/1956 Referring Provider: Darden Palmer, MD  Encounter Date: 06/03/2016      PT End of Session - 06/03/16 1537    Visit Number 5   Number of Visits 13   Date for PT Re-Evaluation 07/04/16   PT Start Time 1331   PT Stop Time 1415   PT Time Calculation (min) 44 min   Activity Tolerance Patient tolerated treatment well   Behavior During Therapy [facility] for tasks assessed/performed  cues to stay on task       Past Medical History  Diagnosis Date  . Hypertension   . Hyperlipidemia   . Coronary artery disease   . Organic impotence   . History of rectal cancer     dx 2006 --  Stage III, T3  N1  M0,  s/p  chemoradiation (07-10-2005 to 09-05-2005) and anterior colon resection 11-04-2006  . DDD (degenerative disc disease), lumbar   . Iron deficiency anemia     intolerent PO iron--  gets IV iron,  last one 03-12-2015 ([facility])  . Diabetes mellitus without complication ([place])   . Dyslipidemia   . Rectal cancer (HCC)     adenocarcinoma stage lll  . History of shingles     Past Surgical History  Procedure Laterality Date  . Penile prosthesis placement  10-29-2009  . Port a cath placement  07-23-2005  . Anterior colon resection w/ colostomy  11-04-2006  . Colostomy takedown  june 2007  . Colonoscopy with esophagogastroduodenoscopy (egd)  last one 03-07-2010  . Cataract extraction w/ intraocular lens implant Right sept 2015  &  feb 2016  . Examination under anesthesia N/A 04/23/2015    Procedure: EXAM UNDER ANESTHESIA, MANIPULATION OF PENILE PROSTHESIS PUMP;  Surgeon: Kathie Rhodes, MD;  Location: [place];  Service: Urology;  Laterality: N/A;  . Colonoscopy with propofol N/A 10/23/2015    Procedure:  COLONOSCOPY WITH PROPOFOL;  Surgeon: Garlan Fair, MD;  [facility];  Service: Endoscopy;  Laterality: N/A;    There were no vitals filed for this visit.      Subjective Assessment - 06/03/16 1345    Subjective No complaints, pain has not been >2/10.  Going to [place] soon, leaves in 2 days. This will be his last day.    Currently in Pain? No/denies            [facility] PT Assessment - 06/03/16 1536    AROM   Overall AROM Comments C-AROM WNL             [facility] Adult PT Treatment/Exercise - 06/03/16 1352    Neck Exercises: Machines for Strengthening   UBE (Upper Arm Bike) 5 min , L3 for posture    Neck Exercises: Theraband   Shoulder Extension 20 reps;Blue   Rows 20 reps;Blue   Shoulder External Rotation 10 reps;Green   Horizontal ABduction 10 reps;Green   Manual Therapy   Soft tissue mobilization Rt. levator scapula and upper trap   Myofascial Release upper back and neck    Passive ROM rotaton    Manual Traction gentle traction x 5, 10 sec.    Neck Exercises: Stretches   Upper Trapezius Stretch 2 reps;30 seconds   Levator Stretch 2 reps;30 seconds   Other Neck Stretches cervical rotation x 2  each, 30 sec                 PT Education - 06/03/16 1536    Education provided Yes   Education Details HEP, massage, DC plans    Person(s) Educated Patient   Methods Explanation   Comprehension Verbalized understanding          PT Short Term Goals - 06/03/16 1539    PT SHORT TERM GOAL #1   Title pt will verbalize pain <=6/10 after sitting for 15 mins by 6/16   Status Achieved   PT SHORT TERM GOAL #2   Title independent in HEP as it has been established.   Status Achieved   PT SHORT TERM GOAL #3   Title verbalized improved ability to sit at dinner table to eat   Status Achieved           PT Long Term Goals - 06/03/16 1539    PT LONG TERM GOAL #1   Title Pt will be able to sit for at least 30 min pain <=2/10 by 07/04/16   Status Achieved    PT LONG TERM GOAL #2   Title Pt will be able to perform garden work pain <=2/10   Status Achieved   PT LONG TERM GOAL #3   Title Pt will be able to write without grip limitations.    Status Achieved   PT LONG TERM GOAL #4   Title Grip strength within 5lb R to L   Status Unable to assess   PT LONG TERM GOAL #5   Title External rotation strength 5/5   Status Achieved               Plan - 06/03/16 1537    Clinical Impression Statement Pt without pain for several days.  He has HEP and will be travelling in the coming weeks.  He has normal strength and AROM of C spine.  Knows a massage therapist for continued soft tissue work.    PT Next Visit Plan NA   PT Home Exercise Plan neck tension stretching, chin tuck, scap retraction , low row   Consulted and Agree with Plan of Care Patient      Patient will benefit from skilled therapeutic intervention in order to improve the following deficits and impairments:     Visit Diagnosis: Cervicalgia  Pain in right shoulder  Pain in right elbow  Muscle weakness (generalized)     Problem List Patient Active Problem List   Diagnosis Date Noted  . Diabetes mellitus, type 2 ([place] Tate) 10/11/2015    PAA,JENNIFER 06/03/2016, 3:41 PM  [facility] [address] [place], [place], [address] Phone: [phone]   Fax:  [phone]  Name: Phillip Tate MRN: [id number] Date of Birth: 05-14-1956    PHYSICAL THERAPY DISCHARGE SUMMARY  Visits from Start of Care: 5  Current functional level related to goals / functional outcomes: See above    Remaining deficits: None limiting function   Education / Equipment: HEP, posture and Cervical spine anatomy  Plan: Patient agrees to discharge.  Patient goals were not met. Patient is being discharged due to meeting the stated rehab goals.  ?????     [name], PT 06/03/2016 3:41 PM Phone: [phone] Fax:  [phone]  [name], PT 06/03/2016 3:42 PM Phone: [phone] Fax: [phone]

## 2016-06-04 ENCOUNTER — Other Ambulatory Visit (HOSPITAL_COMMUNITY): Payer: Self-pay | Admitting: Internal Medicine

## 2016-06-04 ENCOUNTER — Encounter (HOSPITAL_COMMUNITY): Payer: Self-pay

## 2016-06-04 ENCOUNTER — Encounter (HOSPITAL_COMMUNITY)
Admission: RE | Admit: 2016-06-04 | Discharge: 2016-06-04 | Disposition: A | Discharge status: Home / Self Care | Payer: BLUE CROSS/BLUE SHIELD | Source: Ambulatory Visit | Attending: Internal Medicine | Admitting: Internal Medicine

## 2016-06-04 ENCOUNTER — Encounter: Payer: BLUE CROSS/BLUE SHIELD | Admitting: Physical Therapy

## 2016-06-04 DIAGNOSIS — D649 Anemia, unspecified: Secondary | ICD-10-CM | POA: Insufficient documentation

## 2016-06-04 MED ORDER — SODIUM CHLORIDE 0.9 % IV SOLN
INTRAVENOUS | Status: DC
  Administered 2016-06-04: 14:00:00 via INTRAVENOUS

## 2016-06-04 MED ORDER — SODIUM CHLORIDE 0.9 % IV SOLN
510.0000 mg | Freq: Once | INTRAVENOUS | Status: AC
  Administered 2016-06-04: 510 mg via INTRAVENOUS
  Filled 2016-06-04: qty 17

## 2016-06-04 NOTE — Discharge Instructions (Signed)

## 2016-06-06 ENCOUNTER — Encounter: Payer: BLUE CROSS/BLUE SHIELD | Admitting: Physical Therapy

## 2016-06-09 ENCOUNTER — Encounter: Payer: BLUE CROSS/BLUE SHIELD | Admitting: Physical Therapy

## 2016-06-11 ENCOUNTER — Encounter: Payer: BLUE CROSS/BLUE SHIELD | Admitting: Physical Therapy

## 2016-06-16 ENCOUNTER — Encounter: Payer: BLUE CROSS/BLUE SHIELD | Admitting: Physical Therapy

## 2016-06-18 ENCOUNTER — Encounter: Payer: BLUE CROSS/BLUE SHIELD | Admitting: Physical Therapy

## 2016-06-23 ENCOUNTER — Encounter: Payer: BLUE CROSS/BLUE SHIELD | Admitting: Physical Therapy

## 2016-06-25 ENCOUNTER — Encounter: Payer: BLUE CROSS/BLUE SHIELD | Admitting: Physical Therapy

## 2016-06-30 ENCOUNTER — Encounter: Payer: BLUE CROSS/BLUE SHIELD | Admitting: Physical Therapy

## 2016-07-02 ENCOUNTER — Encounter: Payer: BLUE CROSS/BLUE SHIELD | Admitting: Physical Therapy

## 2016-08-01 ENCOUNTER — Other Ambulatory Visit: Payer: Self-pay | Admitting: Internal Medicine

## 2016-08-01 DIAGNOSIS — R200 Anesthesia of skin: Secondary | ICD-10-CM

## 2016-08-01 DIAGNOSIS — I6529 Occlusion and stenosis of unspecified carotid artery: Secondary | ICD-10-CM

## 2016-08-12 ENCOUNTER — Ambulatory Visit
Admission: RE | Admit: 2016-08-12 | Discharge: 2016-08-12 | Disposition: A | Discharge status: Home / Self Care | Payer: BLUE CROSS/BLUE SHIELD | Source: Ambulatory Visit | Attending: Internal Medicine | Admitting: Internal Medicine

## 2016-08-12 DIAGNOSIS — I6529 Occlusion and stenosis of unspecified carotid artery: Secondary | ICD-10-CM

## 2016-08-12 DIAGNOSIS — R200 Anesthesia of skin: Secondary | ICD-10-CM

## 2016-09-04 ENCOUNTER — Encounter: Payer: Self-pay | Admitting: Interventional Cardiology

## 2016-09-11 ENCOUNTER — Encounter: Payer: BLUE CROSS/BLUE SHIELD | Admitting: Neurology

## 2016-09-17 NOTE — Progress Notes (Signed)
Cardiology Office Note   Date:  09/18/2016   ID:  Phillip Tate, DOB 06-Apr-1956, MRN [id number]  PCP:  Phillip Low, MD    No chief complaint on file. RF for CAD   Wt Readings from Last 3 Encounters:  09/18/16 222 lb 12.8 oz (101.1 kg)  06/04/16 226 lb 6 oz (102.7 kg)  01/17/16 230 lb 3.2 oz (104.4 kg)       History of Present Illness: Phillip Tate is a 60 y.o. male  Who has had rectal cancer. He has had negative stress test in the past.  HE has had LE swelling in the past but this has improved.  He had a 20 mm Hg difference between BP readings in his arms.  THis has not been worked up invasively before.  He has persistent incontinence due to surgery for rectal cancer in the past.   He had some neck problems that thankfully did not require surgery. He has improved significantly from that. Overall, he feels well. He continues to have a difference in blood pressures in both arms but has no weakness, numbness or tingling in either arm. He reports some burning sensation in his feet.  He is taking Methi, a natural food, for Tate testosterone with improvement.     Past Medical History:  Diagnosis Date  . Coronary artery disease   . DDD (degenerative disc disease), lumbar   . Diabetes mellitus without complication ([place])   . Dyslipidemia   . History of rectal cancer    dx 2006 --  Stage III, T3  N1  M0,  s/p  chemoradiation (07-10-2005 to 09-05-2005) and anterior colon resection 11-04-2006  . History of shingles   . Hyperlipidemia   . Hypertension   . Iron deficiency anemia    intolerent PO iron--  gets IV iron,  last one 03-12-2015 ([facility])  . Organic impotence   . Rectal cancer ([facility])    adenocarcinoma stage lll    Past Surgical History:  Procedure Laterality Date  . ANTERIOR COLON RESECTION W/ COLOSTOMY  11-04-2006  . CATARACT EXTRACTION W/ INTRAOCULAR LENS IMPLANT Right sept 2015  &  feb 2016  . COLONOSCOPY WITH ESOPHAGOGASTRODUODENOSCOPY (EGD)   last one 03-07-2010  . COLONOSCOPY WITH PROPOFOL N/A 10/23/2015   Procedure: COLONOSCOPY WITH PROPOFOL;  Surgeon: Phillip Fair, MD;  Location: [facility];  Service: Endoscopy;  Laterality: N/A;  . COLOSTOMY TAKEDOWN  june 2007  . EXAMINATION UNDER ANESTHESIA N/A 04/23/2015   Procedure: EXAM UNDER ANESTHESIA, MANIPULATION OF PENILE PROSTHESIS PUMP;  Surgeon: Phillip Rhodes, MD;  Location: [address];  Service: Urology;  Laterality: N/A;  . PENILE PROSTHESIS PLACEMENT  10-29-2009  . PORT A CATH PLACEMENT  07-23-2005     Current Outpatient Prescriptions  Medication Sig Dispense Refill  . amLODipine (NORVASC) 10 MG tablet Take 10 mg by mouth every morning.     . empagliflozin (JARDIANCE) 25 MG TABS tablet Take 25 mg by mouth daily.    . fish oil-omega-3 fatty acids 1000 MG capsule Take 1,500 mg by mouth daily.    Marland Kitchen gabapentin (NEURONTIN) 300 MG capsule Take 300 mg by mouth 3 (three) times daily.   0  . hyoscyamine (LEVSIN, ANASPAZ) 0.125 MG tablet Take 0.125 mg by mouth daily.    Marland Kitchen ibuprofen (ADVIL,MOTRIN) 200 MG tablet Take 200 mg by mouth every 6 (six) hours as needed for fever or moderate pain.    Marland Kitchen insulin glargine (LANTUS) 100 UNIT/ML injection Inject 40-42  Units into the skin at bedtime.     . insulin lispro (HUMALOG) 100 UNIT/ML injection Inject 16-40 Units into the skin 3 (three) times daily before meals. Sliding scan  16units am, 25-30units lunch. 35-40units supper    . INVOKANA 300 MG TABS tablet Take 300 mg by mouth daily before breakfast.   2  . labetalol (NORMODYNE) 300 MG tablet Take 300 mg by mouth 2 (two) times daily.    Marland Kitchen lisinopril (PRINIVIL,ZESTRIL) 40 MG tablet Take 40 mg by mouth every evening.     . metFORMIN (GLUCOPHAGE) 1000 MG tablet Take 1,000 mg by mouth 2 (two) times daily with a meal.    . Multiple Vitamin (MULTIVITAMIN) tablet Take 1 tablet by mouth daily.    Marland Kitchen NOVOFINE 30G X 8 MM MISC Inject 1 packet into the skin as needed. BLOOD SUGARS  2  .  NOVOLOG FLEXPEN 100 UNIT/ML FlexPen Inject 90 Units into the skin 3 (three) times daily with meals.   2  . polyvinyl alcohol (LIQUIFILM TEARS) 1.4 % ophthalmic solution Place 1 drop into both eyes as needed for dry eyes.    . rosuvastatin (CRESTOR) 10 MG tablet Take 1 tablet (10 mg total) by mouth daily. 90 tablet 3  . VALACYCLOVIR HCL PO Take 1 mg by mouth daily.    Marland Kitchen zolpidem (AMBIEN) 10 MG tablet Take 10 mg by mouth at bedtime.     No current facility-administered medications for this visit.     Allergies:   Cardizem cd [diltiazem hcl er beads]; Toprol xl [metoprolol]; and Tricor [fenofibrate]    Social History:  The patient  reports that he has never smoked. He has never used smokeless tobacco. He reports that he drinks alcohol. He reports that he does not use drugs.   Family History:  The patient's family history includes Diabetes in his mother; Hypertension in his father.    ROS:  Please see the history of present illness.   Otherwise, review of systems are positive for neck pain.   All other systems are reviewed and negative.    PHYSICAL EXAM: VS:  BP 110/60   Pulse 70   Ht 5\' 10"  (1.778 m)   Wt 222 lb 12.8 oz (101.1 kg)   BMI 31.97 kg/m  , BMI Body mass index is 31.97 kg/m. GEN: Well nourished, well developed, in no acute distress  HEENT: normal  Neck: no JVD, carotid bruits, or masses Cardiac: RRR; no murmurs, rubs, or gallops,no edema , 2+ radial pulses bilaterally Respiratory:  clear to auscultation bilaterally, normal work of breathing GI: soft, nontender, nondistended, + BS MS: no deformity or atrophy  Skin: warm and dry, no rash Neuro:  Strength and sensation are intact Psych: euthymic mood, full affect   EKG:   The ekg ordered today demonstrates NSR, NSST   Recent Labs: No results found for requested labs within last 8760 hours.   Lipid Panel No results found for: CHOL, TRIG, HDL, CHOLHDL, VLDL, LDLCALC, LDLDIRECT   Other studies Reviewed: Additional  studies/ records that were reviewed today with results demonstrating: Prior negative stress test.   ASSESSMENT AND PLAN:  1. HTN:  Blood pressure well controlled. Continue regular exercise and current medications.  We clarified that he is only taking amlodipine 10 mg daily, not 20 mg.  2. DM: Managed by endocrinology.  He reports that his last hemoglobin A1c was 6.2. 3. Hyperlipidemia: Was on Crestor to keep LDL < 100.  He stopped Crestor.  He requests a  new prescription for the generic for Crestor. He has never had good results with Lipitor in the past.   Current medicines are reviewed at length with the patient today.  The patient concerns regarding his medicines were addressed.  The following changes have been made:  No change  Labs/ tests ordered today include:   Orders Placed This Encounter  Procedures  . EKG 12-Lead    Recommend 150 minutes/week of aerobic exercise Tate fat, Tate carb, high fiber diet recommended  Disposition:   FU prn   Signed, Larae Grooms, MD  09/18/2016 2:17 PM    [facility] [address], [place], [place]  [address] Phone: [phone]; Fax: [phone]

## 2016-09-18 ENCOUNTER — Ambulatory Visit (INDEPENDENT_AMBULATORY_CARE_PROVIDER_SITE_OTHER): Payer: BLUE CROSS/BLUE SHIELD | Admitting: Interventional Cardiology

## 2016-09-18 ENCOUNTER — Encounter: Payer: Self-pay | Admitting: Interventional Cardiology

## 2016-09-18 ENCOUNTER — Encounter (INDEPENDENT_AMBULATORY_CARE_PROVIDER_SITE_OTHER): Payer: Self-pay

## 2016-09-18 VITALS — BP 110/60 | HR 70 | Ht 70.0 in | Wt 222.8 lb

## 2016-09-18 DIAGNOSIS — E785 Hyperlipidemia, unspecified: Secondary | ICD-10-CM | POA: Insufficient documentation

## 2016-09-18 DIAGNOSIS — I10 Essential (primary) hypertension: Secondary | ICD-10-CM | POA: Insufficient documentation

## 2016-09-18 DIAGNOSIS — E1151 Type 2 diabetes mellitus with diabetic peripheral angiopathy without gangrene: Secondary | ICD-10-CM

## 2016-09-18 MED ORDER — ROSUVASTATIN CALCIUM 10 MG PO TABS: 10.0000 mg | ORAL_TABLET | Freq: Every day | ORAL | 3 refills | Status: AC

## 2016-09-18 NOTE — Patient Instructions (Signed)
Medication Instructions:  Same-no changes  Labwork: None  Testing/Procedures: None  Follow-Up: As needed  Any Other Special Instructions Will Be Listed Below (If Applicable).     If you need a refill on your cardiac medications before your next appointment, please call your pharmacy.

## 2016-09-25 ENCOUNTER — Telehealth: Payer: Self-pay | Admitting: Interventional Cardiology

## 2016-09-25 NOTE — Telephone Encounter (Signed)
Walk in pt form-Sealed Envelope dropped off-gave to [facility]

## 2016-12-09 ENCOUNTER — Ambulatory Visit (HOSPITAL_COMMUNITY)
Admission: RE | Admit: 2016-12-09 | Discharge: 2016-12-09 | Disposition: A | Discharge status: Home / Self Care | Payer: BLUE CROSS/BLUE SHIELD | Source: Ambulatory Visit | Attending: Internal Medicine | Admitting: Internal Medicine

## 2016-12-09 DIAGNOSIS — D509 Iron deficiency anemia, unspecified: Secondary | ICD-10-CM | POA: Insufficient documentation

## 2016-12-09 MED ORDER — SODIUM CHLORIDE 0.9 % IV SOLN
510.0000 mg | Freq: Once | INTRAVENOUS | Status: AC
  Administered 2016-12-09: 510 mg via INTRAVENOUS
  Filled 2016-12-09: qty 17

## 2016-12-09 NOTE — Progress Notes (Signed)
Diagnosis:  D50.9 Iron deficiency anemia  Provider: Roel Cluck  Procedure: Pt received  Feraheme via piv.  Pt tolerated procedure well.  Post procedure: Pt was observed for 30 mins after the Feraheme infusion. Pt was alert,oriented and ambulatory at discharge. Discharge instructions given with verbal understanding.

## 2017-03-25 ENCOUNTER — Ambulatory Visit
Admission: RE | Admit: 2017-03-25 | Discharge: 2017-03-25 | Disposition: A | Discharge status: Home / Self Care | Payer: BLUE CROSS/BLUE SHIELD | Source: Ambulatory Visit | Attending: Critical Care Medicine | Admitting: Critical Care Medicine

## 2017-03-25 ENCOUNTER — Other Ambulatory Visit: Payer: Self-pay | Admitting: Critical Care Medicine

## 2017-03-25 DIAGNOSIS — M25511 Pain in right shoulder: Secondary | ICD-10-CM

## 2017-04-08 ENCOUNTER — Encounter (HOSPITAL_COMMUNITY): Payer: Self-pay

## 2017-04-08 ENCOUNTER — Encounter (INDEPENDENT_AMBULATORY_CARE_PROVIDER_SITE_OTHER): Payer: Self-pay

## 2017-04-08 ENCOUNTER — Encounter (HOSPITAL_COMMUNITY)
Admission: RE | Admit: 2017-04-08 | Discharge: 2017-04-08 | Disposition: A | Discharge status: Home / Self Care | Payer: BLUE CROSS/BLUE SHIELD | Source: Ambulatory Visit | Attending: Internal Medicine | Admitting: Internal Medicine

## 2017-04-08 DIAGNOSIS — D508 Other iron deficiency anemias: Secondary | ICD-10-CM | POA: Diagnosis present

## 2017-04-08 MED ORDER — SODIUM CHLORIDE 0.9 % IV SOLN
510.0000 mg | INTRAVENOUS | Status: DC
  Administered 2017-04-08: 510 mg via INTRAVENOUS
  Filled 2017-04-08: qty 17

## 2017-04-08 MED ORDER — SODIUM CHLORIDE 0.9 % IV SOLN
INTRAVENOUS | Status: DC
  Administered 2017-04-08: 09:00:00 via INTRAVENOUS

## 2017-04-08 NOTE — Discharge Instructions (Signed)
°  Feraheme °Ferumoxytol injection °What is this medicine? °FERUMOXYTOL is an iron complex. Iron is used to make healthy red blood cells, which carry oxygen and nutrients throughout the body. This medicine is used to treat iron deficiency anemia in people with chronic kidney disease. °This medicine may be used for other purposes; ask your health care provider or pharmacist if you have questions. °COMMON BRAND NAME(S): Feraheme °What should I tell my health care provider before I take this medicine? °They need to know if you have any of these conditions: °-anemia not caused by low iron levels °-high levels of iron in the blood °-magnetic resonance imaging (MRI) test scheduled °-an unusual or allergic reaction to iron, other medicines, foods, dyes, or preservatives °-pregnant or trying to get pregnant °-breast-feeding °How should I use this medicine? °This medicine is for injection into a vein. It is given by a health care professional in a hospital or clinic setting. °Talk to your pediatrician regarding the use of this medicine in children. Special care may be needed. °Overdosage: If you think you have taken too much of this medicine contact a poison control center or emergency room at once. °NOTE: This medicine is only for you. Do not share this medicine with others. °What if I miss a dose? °It is important not to miss your dose. Call your doctor or health care professional if you are unable to keep an appointment. °What may interact with this medicine? °This medicine may interact with the following medications: °-other iron products °This list may not describe all possible interactions. Give your health care provider a list of all the medicines, herbs, non-prescription drugs, or dietary supplements you use. Also tell them if you smoke, drink alcohol, or use illegal drugs. Some items may interact with your medicine. °What should I watch for while using this medicine? °Visit your doctor or healthcare professional  regularly. Tell your doctor or healthcare professional if your symptoms do not start to get better or if they get worse. You may need blood work done while you are taking this medicine. °You may need to follow a special diet. Talk to your doctor. Foods that contain iron include: whole grains/cereals, dried fruits, beans, or peas, leafy green vegetables, and organ meats (liver, kidney). °What side effects may I notice from receiving this medicine? °Side effects that you should report to your doctor or health care professional as soon as possible: °-allergic reactions like skin rash, itching or hives, swelling of the face, lips, or tongue °-breathing problems °-changes in blood pressure °-feeling faint or lightheaded, falls °-fever or chills °-flushing, sweating, or hot feelings °-swelling of the ankles or feet °Side effects that usually do not require medical attention (report to your doctor or health care professional if they continue or are bothersome): °-diarrhea °-headache °-nausea, vomiting °-stomach pain °This list may not describe all possible side effects. Call your doctor for medical advice about side effects. You may report side effects to FDA at 1-800-FDA-1088. °Where should I keep my medicine? °This drug is given in a hospital or clinic and will not be stored at home. °NOTE: This sheet is a summary. It may not cover all possible information. If you have questions about this medicine, talk to your doctor, pharmacist, or health care provider. °© 2018 [employer]vier/[employer] Standard (2016-01-17 12:41:49) ° °

## 2017-04-15 ENCOUNTER — Encounter (HOSPITAL_COMMUNITY)
Admission: RE | Admit: 2017-04-15 | Discharge: 2017-04-15 | Disposition: A | Discharge status: Home / Self Care | Payer: BLUE CROSS/BLUE SHIELD | Source: Ambulatory Visit | Attending: Internal Medicine | Admitting: Internal Medicine

## 2017-04-15 ENCOUNTER — Encounter (HOSPITAL_COMMUNITY): Payer: Self-pay

## 2017-04-15 DIAGNOSIS — D508 Other iron deficiency anemias: Secondary | ICD-10-CM | POA: Diagnosis not present

## 2017-04-15 MED ORDER — SODIUM CHLORIDE 0.9 % IV SOLN
510.0000 mg | INTRAVENOUS | Status: AC
  Administered 2017-04-15: 510 mg via INTRAVENOUS
  Filled 2017-04-15: qty 17

## 2017-04-15 MED ORDER — SODIUM CHLORIDE 0.9 % IV SOLN
INTRAVENOUS | Status: DC
  Administered 2017-04-15: 08:00:00 via INTRAVENOUS

## 2017-04-15 NOTE — Discharge Instructions (Signed)
Ferumoxytol injection Infusion What is this medicine? FERUMOXYTOL is an iron complex. Iron is used to make healthy red blood cells, which carry oxygen and nutrients throughout the body. This medicine is used to treat iron deficiency anemia in people with chronic kidney disease. This medicine may be used for other purposes; ask your health care provider or pharmacist if you have questions. COMMON BRAND NAME(S): Feraheme What should I tell my health care provider before I take this medicine? They need to know if you have any of these conditions: -anemia not caused by low iron levels -high levels of iron in the blood -magnetic resonance imaging (MRI) test scheduled -an unusual or allergic reaction to iron, other medicines, foods, dyes, or preservatives -pregnant or trying to get pregnant -breast-feeding How should I use this medicine? This medicine is for injection into a vein. It is given by a health care professional in a hospital or clinic setting. Talk to your pediatrician regarding the use of this medicine in children. Special care may be needed. Overdosage: If you think you have taken too much of this medicine contact a poison control center or emergency room at once. NOTE: This medicine is only for you. Do not share this medicine with others. What if I miss a dose? It is important not to miss your dose. Call your doctor or health care professional if you are unable to keep an appointment. What may interact with this medicine? This medicine may interact with the following medications: -other iron products This list may not describe all possible interactions. Give your health care provider a list of all the medicines, herbs, non-prescription drugs, or dietary supplements you use. Also tell them if you smoke, drink alcohol, or use illegal drugs. Some items may interact with your medicine. What should I watch for while using this medicine? Visit your doctor or healthcare professional  regularly. Tell your doctor or healthcare professional if your symptoms do not start to get better or if they get worse. You may need blood work done while you are taking this medicine. You may need to follow a special diet. Talk to your doctor. Foods that contain iron include: whole grains/cereals, dried fruits, beans, or peas, leafy green vegetables, and organ meats (liver, kidney). What side effects may I notice from receiving this medicine? Side effects that you should report to your doctor or health care professional as soon as possible: -allergic reactions like skin rash, itching or hives, swelling of the face, lips, or tongue -breathing problems -changes in blood pressure -feeling faint or lightheaded, falls -fever or chills -flushing, sweating, or hot feelings -swelling of the ankles or feet Side effects that usually do not require medical attention (report to your doctor or health care professional if they continue or are bothersome): -diarrhea -headache -nausea, vomiting -stomach pain This list may not describe all possible side effects. Call your doctor for medical advice about side effects. You may report side effects to FDA at 1-800-FDA-1088. Where should I keep my medicine? This drug is given in a hospital or clinic and will not be stored at home. NOTE: This sheet is a summary. It may not cover all possible information. If you have questions about this medicine, talk to your doctor, pharmacist, or health care provider.  2018 Elsevier/Gold Standard (2016-01-17 12:41:49)

## 2018-02-05 ENCOUNTER — Ambulatory Visit
Admission: RE | Admit: 2018-02-05 | Discharge: 2018-02-05 | Disposition: A | Discharge status: Home / Self Care | Payer: BLUE CROSS/BLUE SHIELD | Source: Ambulatory Visit | Attending: Geriatric Medicine | Admitting: Geriatric Medicine

## 2018-02-05 ENCOUNTER — Other Ambulatory Visit: Payer: Self-pay | Admitting: Geriatric Medicine

## 2018-02-05 DIAGNOSIS — R05 Cough: Secondary | ICD-10-CM

## 2018-02-05 DIAGNOSIS — R059 Cough, unspecified: Secondary | ICD-10-CM

## 2018-10-26 DIAGNOSIS — K623 Rectal prolapse: Secondary | ICD-10-CM | POA: Insufficient documentation

## 2018-10-26 NOTE — Progress Notes (Signed)
Formatting of this note might be different from the original.  Review of Systems   Gastrointestinal: Positive for blood in stool.   Neurological: Positive for numbness.       Electronically signed by Graciella FreerAdams, Mercedes M, LPN at 16/10/960410/30/2019 10:09 AM EDT

## 2018-10-26 NOTE — Progress Notes (Signed)
Formatting of this note might be different from the original.  Assisted Provider Dr Luciano CutterMantyh & Dr Wynelle LinkSun with visual anal exam, digital rectal exam and anoscopy; tolerated well. Patient identified by names and DOB and procedures described by provider beforehand.    Electronically signed by Kluttz, Armeniahina Underwood, RN at 10/27/2018 10:09 AM EDT

## 2018-10-26 NOTE — Progress Notes (Signed)
Formatting of this note is different from the original.  [facility] New Patient Consultation    Mr. Moscato is a 62 y.o. male who was referred by Kathi Der, MD for evaluation of establishment of care for bleeding per rectum.    Date of Visit: 10/26/2018    Chief Complaint: "Bleeding from the rectum for 3 months"    HPI:   Evan Shepherd is a 62 y.o. male with PMHx significant for rectal adenocarcinoma diagnosed in 2006 stage IIIA, s/p neoadjuvant chemoradio therapy, s/p intersphincteric proctectomy with colonic J-pouch and inverting loop ileostomy in November 2006, s/p loop ileostomy takedown June 2007 presenting with small amounts of bright red blood per rectum for the last 3 months. The patient was in his usual state of health when he started have small amounts of blood and mucus come out of his rectum. The patient normally has fecal incontinence due to his prior surgery and is therefore discovering the blood in the pull-ups that he uses. The patient has about 3 or 4 bowel movements per day. The patient has not had any changes in his bowel habits or in his appetite. Pt is not experiencing any unintended weight loss or rectal pain.     Preoperative Risks/Screening    1. Frailty Review:   Lives independently? yes    Uses a mobility assist device (cane/walker/wheelchair)? no   History of falls within 3 months? no   Cognitive impairment/dementia? no  2. Nutrition Screening:   Cancer/IBD? no   Age >65?  no   Weight loss >10% in past 6 months? no   If yes to any of the 3 above, consider Impact AR  3. PONV Screening:   History of PONV? no   Male under age of 34? no   History of motion sickness? no  4. Chronic pain issues? no  5. Diabetes? yes Last HgbA1c: No results found for: HGBA1C but patient reports his last HGBA1C was 6.5.    Medications:  Outpatient Medications Marked as Taking for the 10/26/18 encounter (Office Visit) with Clifton James, MD   Medication Sig   ? amLODIPine  (NORVASC) 10 MG tablet Take by mouth   ? hyoscyamine (ANASPAZ,LEVSIN) 0.125 mg tablet Take by mouth   ? insulin GLARGINE (LANTUS) injection (concentration 100 units/mL) Inject subcutaneously   ? JARDIANCE 25 mg Tab tablet    ? lisinopril (PRINIVIL,ZESTRIL) 40 MG tablet lisinopril   ? metFORMIN (GLUCOPHAGE) 1000 MG tablet Take by mouth   ? NOVOLOG FLEXPEN U-100 INSULIN pen injector (concentration 100 units/mL)    ? rosuvastatin (CRESTOR) 10 MG tablet daily   ? valacyclovir HCl (VALTREX ORAL) valacyclovir   ? zolpidem (AMBIEN) 10 mg tablet TK 1 T PO HS PRN ONCE A DAY     Allergies:  Allergies   Allergen Reactions   ? Fenofibrate Other (See Comments)     Constipation   ? Toprol Xl [Metoprolol Succinate] Headache     Past Medical History:   Rectal adenocarcinoma    Past Surgical History:   Intersphincteric proctectomy with colonic J-pouch and inverting loop ileostomy November 2006   Loop ileostomy takedown June 2007    Social History:  Social History     Socioeconomic History   ? Marital status: Married     Spouse name: Not on file   ? Number of children: Not on file   ? Years of education: Not on file   ? Highest education level: Not on file   Occupational History   ?  Not on file   Social Needs   ? Financial resource strain: Not on file   ? Food insecurity:     Worry: Not on file     Inability: Not on file   ? Transportation needs:     Medical: Not on file     Non-medical: Not on file   Tobacco Use   ? Smoking status: Never Smoker   ? Smokeless tobacco: Never Used   Substance and Sexual Activity   ? Alcohol use: Not on file   ? Drug use: Not on file   ? Sexual activity: Not on file   Other Topics Concern   ? Not on file   Social History Narrative   ? Not on file     Family History:  No family history on file.    Review of Systems:    ROS was obtained today from the patient by the ancillary provider and personally reviewed by myself. It is documented in a separate note which included the pertinent  positives/negatives. All other systems negative.     Objective:    Vital Signs:  BP 96/59 (BP Location: Left upper arm, Patient Position: Sitting, BP Cuff Size: Adult)   Pulse 70   Temp 36.6 C (97.9 F) (Oral)   Resp 18   Ht 173 cm (5' 8.11")   Wt 96.3 kg (212 lb 4.9 oz)   SpO2 96%   BMI 32.18 kg/m     Physical Exam:  Physical Exam  Vitals signs and nursing note reviewed.   Constitutional:       Appearance: Normal appearance.   Abdominal:      General: A surgical scar is present.      Palpations: Abdomen is soft.   Neurological:      Mental Status: He is alert.         Detailed Rectal Exam:  After verbal informed consent,  Arin Lax was taken to the proctology exam room. Exam was performed in prone.  The patient tolerated the procedure well    External exam : Poor sphincter tone    Digital rectal exam revealed no abnormalities and poor sphincter tone    Procedure    This procedure has been fully reviewed with the patient and informed consent has been obtained.Time out protocol was followed to verify the identity of the patient and the correct procedure being performed with the patient?s participation and agreement.    The anoscope was inserted under direct visualization. Anoscopy was tolerated and showed prolapsed bleeding mucosa from the J-pouch. No evidence of mass or tumor. The scope was slowly withdrawn.    Rectal exam and procedure performed by the Attending Physician.     Imaging: Preliminary personal review of imaging/studies below by APP and MD    [facility]    Procedure Date: 10/11/2018 10:02 AM    MRN: [id number]    Date of Birth: 01-Jan-1956    Age: 31    Gender: Male    Attending MD: Kathi Der, MD    Procedure: Colonoscopy    Indication: Last colonoscopy: 2016, Rectal bleeding. H/O rectal cancer    Providers: Kathi Der, MD, Paulla Dolly, CRNA, Blair Dolphin, Technician    Referring MD: Georgann Housekeeper, MD (Referring MD), Fulton Reek, MD (Referring  MD)    Medicines: Lidocaine IV 100 mgs, Propofol 200 mgs    Complications: No immediate complications.    Findings: The digital rectal exam findings include decreased sphincter tone. The terminal  ileum appeared normal. A small amount of liquid stool was found in the entire colon, interfering with visualization. Lavage of the area was performed, resulting in clearance with good visualization. Normal mucosa was found in the sigmoid colon, in the descending colon, in the transverse colon, in the ascending colon and in the cecum. Biopsies for histology were taken with a cold forceps for evaluation of microscopic colitis. A 3 mm polyp was found in the sigmoid colon. The polyp was removed with a cold biopsy forceps. Resection and retrieval were complete. A localized area of moderately thickened folds of mucosa was found in the rectum. biopsies were taken with a cold forceps for histology. Retroflexion in the right colon was performed. There was evidence of a prior end-to-side colo-rectal anastomosis at 12 proximal to the anus. This was patent and was characterized by healthy appearing mucosa. No additional abnormalities were found on retroflexion.     Impression: Decreased sphincter tone found on digital rectal exam.  - The examined portion of the ileum was normal  - Stool in the entire examined colon  - Normal mucosa in the sigmoid colon, in the descending colon, in the transverse colon, in the ascending colon and the cecum. Biopsied.  - One 3 mm polyp in the sigmoid colon, removed with cold biopsy forceps. Resected and retrieved.  - Thickened folds of the mucosa in the rectum. Biopsied.  - Patent end-to-side colo-rectal anastamosis, characterized by healthy aoppearing mucosa.    Recommendation: Patient has a contact number available for emergencies. The signs and symptoms of potential delayed comlpications were discussed with the patient. Return to normal activities tomorrow. Written discharge instructions were provided  to the patient.  - Resume previous diet  - Continue present medications  - Await pathology Results  - Repeat colonoscopy in 5 years for surveillance  - Return to my office in 6 months.    CT 3D  reformation body                               Verified    CT of abdomen and pelvis.    Comparison: [facility] 02/18/2008    Indication: 62 year old male with abdominal pain.    Technique: Multiple axial images of the abdomen were obtained extending  from the lower thorax through the iliac crests.  This study was acquired  following the IV administration of iodinated contrast material, given the  patients indications for the examination.  If IV contrast material had  not been administered, the likelihood of detecting abnormalities relevant  to the patients condition would have been substantially decreased.  150  mL of Isovue 300  was injected IV at 3  mL/sec.  Serum creatinine was 0.8  mg/dl. 03/05/2008.    Findings:  Limited views of the lung bases demonstrate no evidence of pleural  effusions. Heart is normal in size without evidence of pericardial  effusion.    The liver demonstrates a normal contour with diffuse fatty infiltration  without focal hepatic lesions. Gallbladder is normal without evidence of  intrahepatic or extrahepatic biliary ductal dilatation. The spleen,  pancreas, and bilateral adrenals are within normal limits. The kidneys  are normal in size and enhance symmetrically without evidence of focal  lesion. No evidence of pelvicaliectasis. The hepatic vasculature is  patent. The IVC, aorta, and major aortic branch vessels are patent.    Anterior abdominal wall soft tissue thickening from prior ileostomy  takedown and  ventral hernia repair is seen within the right lower  quadrant on image 53 without protrusion of bowel or defect. Surgical  clips are seen within the distal small bowel consistent with prior  ileo-ilieal reanastomosis. Status post ileocolonic J pouch formation. No  evidence of focal wall  thickening or obstruction. Normal appearing  appendix. No free air free fluid is seen within the abdomen and pelvis.  The bladder demonstrates no focal wall thickening. Prostate is normal in  size. No pathologically enlarged pelvic, retroperitoneal, or inguinal  mesenteric lymph nodes are seen. No aggressive lytic or blastic osseous  lesions are identified.    Impression:  1. No CT explanation for patient's abdominal pain.  2. Prior ileostomy takedown and ventral hernia repair without evidence of  recurrence.  3. Hepatic steatosis.    I have reviewed the images and concur with the above findings.    Report Release Date/Time: [phone]  Resident MD: Neita Garnet  Result ID: [id number]  Approving MD: Tora Perches MD  CT pelvis with contrast                               Verified    CT of abdomen and pelvis.    Comparison: [facility] 02/18/2008    Indication: 62 year old male with abdominal pain.    Technique: Multiple axial images of the abdomen were obtained extending  from the lower thorax through the iliac crests.  This study was acquired  following the IV administration of iodinated contrast material, given the  patients indications for the examination.  If IV contrast material had  not been administered, the likelihood of detecting abnormalities relevant  to the patients condition would have been substantially decreased.  150  mL of Isovue 300  was injected IV at 3  mL/sec.  Serum creatinine was 0.8  mg/dl. 03/05/2008.    Findings:  Limited views of the lung bases demonstrate no evidence of pleural  effusions. Heart is normal in size without evidence of pericardial  effusion.    The liver demonstrates a normal contour with diffuse fatty infiltration  without focal hepatic lesions. Gallbladder is normal without evidence of  intrahepatic or extrahepatic biliary ductal dilatation. The spleen,  pancreas, and bilateral adrenals are within normal limits. The kidneys  are normal in size and enhance  symmetrically without evidence of focal  lesion. No evidence of pelvicaliectasis. The hepatic vasculature is  patent. The IVC, aorta, and major aortic branch vessels are patent.    Anterior abdominal wall soft tissue thickening from prior ileostomy  takedown and ventral hernia repair is seen within the right lower  quadrant on image 53 without protrusion of bowel or defect. Surgical  clips are seen within the distal small bowel consistent with prior  ileo-ilieal reanastomosis. Status post ileocolonic J pouch formation. No  evidence of focal wall thickening or obstruction. Normal appearing  appendix. No free air free fluid is seen within the abdomen and pelvis.  The bladder demonstrates no focal wall thickening. Prostate is normal in  size. No pathologically enlarged pelvic, retroperitoneal, or inguinal  mesenteric lymph nodes are seen. No aggressive lytic or blastic osseous  lesions are identified.    Impression:  1. No CT explanation for patient's abdominal pain.  2. Prior ileostomy takedown and ventral hernia repair without evidence of  recurrence.  3. Hepatic steatosis.    I have reviewed the images and concur with the above findings.  Report Release Date/Time: [phone]  Resident MD: Neita Garnet  Result ID: [id number]  Approving MD: Tora Perches MD  CT abdomen with contrast                               Verified    CT of abdomen and pelvis.    Comparison: [facility] 02/18/2008    Indication: 62 year old male with abdominal pain.    Technique: Multiple axial images of the abdomen were obtained extending  from the lower thorax through the iliac crests.  This study was acquired  following the IV administration of iodinated contrast material, given the  patients indications for the examination.  If IV contrast material had  not been administered, the likelihood of detecting abnormalities relevant  to the patients condition would have been substantially decreased.  150  mL of Isovue 300  was  injected IV at 3  mL/sec.  Serum creatinine was 0.8  mg/dl. 03/05/2008.    Findings:  Limited views of the lung bases demonstrate no evidence of pleural  effusions. Heart is normal in size without evidence of pericardial  effusion.    The liver demonstrates a normal contour with diffuse fatty infiltration  without focal hepatic lesions. Gallbladder is normal without evidence of  intrahepatic or extrahepatic biliary ductal dilatation. The spleen,  pancreas, and bilateral adrenals are within normal limits. The kidneys  are normal in size and enhance symmetrically without evidence of focal  lesion. No evidence of pelvicaliectasis. The hepatic vasculature is  patent. The IVC, aorta, and major aortic branch vessels are patent.    Anterior abdominal wall soft tissue thickening from prior ileostomy  takedown and ventral hernia repair is seen within the right lower  quadrant on image 53 without protrusion of bowel or defect. Surgical  clips are seen within the distal small bowel consistent with prior  ileo-ilieal reanastomosis. Status post ileocolonic J pouch formation. No  evidence of focal wall thickening or obstruction. Normal appearing  appendix. No free air free fluid is seen within the abdomen and pelvis.  The bladder demonstrates no focal wall thickening. Prostate is normal in  size. No pathologically enlarged pelvic, retroperitoneal, or inguinal  mesenteric lymph nodes are seen. No aggressive lytic or blastic osseous  lesions are identified.    Impression:  1. No CT explanation for patient's abdominal pain.  2. Prior ileostomy takedown and ventral hernia repair without evidence of  recurrence.  3. Hepatic steatosis.    I have reviewed the images and concur with the above findings.    Report Release Date/Time: [phone]  Resident MD: Neita Garnet  Result ID: [id number]  Approving MD: Tora Perches MD    Labs:  No results found for: CEA  Hemoglobin   Date Value Ref Range Status   03/05/2008 13.5 (L) 13.7 -  17.3 g/dL Final     Hematocrit   Date Value Ref Range Status   03/05/2008 0.42 0.39 - 0.49 L/L Final     Red Blood Cell Count   Date Value Ref Range Status   03/05/2008 5.36 4.37 - 5.74 X10^12 Final     White Blood Cell Count   Date Value Ref Range Status   03/05/2008 7.0 3.2 - 9.8 X10^9 Final     Platelet Count /L   Date Value Ref Range Status   03/05/2008 221 150 - 450 X10^9 Final     Urea Nitrogen   Date  Value Ref Range Status   03/05/2008 13 7 - 20 mg/dL Final     Creatinine   Date Value Ref Range Status   07/14/2008 0.9 0.6 - 1.3 mg/dL Final     No results found for: PTPATIENT, INR    [facility] Risk Score    Readmission Risk:     AR:    RR:        Renal Complication Risk:     AR:     RR:       Recommend:          Assessment/Plan:    ICD-10-CM ICD-9-CM    1. Rectal mucosa prolapse K62.3 569.1 Case Request Operating Room: HEMORRHOIDECTOMY, INTERNAL AND EXTERNAL, 2 OR MORE COLUMNS/GROUPS      Ambulatory Surgery - Same Day Outpatient      bupivacaine liposome (PF) (EXPAREL) 1.3 % (13.3 mg/mL) injection 266 mg      Case Request Operating Room: HEMORRHOIDECTOMY, INTERNAL AND EXTERNAL, 2 OR MORE COLUMNS/GROUPS     Mr. Patric DykesSalehani is a 62 y.o. male who presents for evaluation of prolapsing J-pouch that is causing daily bleeding per rectum.  1. I recommend anorectal exam under anesthesia with resection of ectropion.  2. Treatment alternatives were discussed.  3. Discussed aspects of surgical intervention, methods, risks (including but not limited to infection, bleeding, damage to surrounding structures, need for reoperation, anastomotic leakage, need for reoperation, need for temporary or permanent stoma, prolonged hospitalization, hernia, obstruction or death) and the risks of general anesthetic including MI, CVA, sudden death or even reaction to anesthetic medications. The patient understands the risks, any and all questions were answered to the patient's satisfaction.  4. Patient does wish to proceed with surgery.   Written consent was obtained.  5.  Patient meets criteria for Enhanced Recovery around the time of this operation.  The details of this care protocol have been discussed with the patient and family members in attendance as noted by the clinic nurse educator.  6. There were no barriers to communication. Encouraged to call with any questions or concerns.     7. Surgical Plan:   *Location: [facility]   *ERAS--no   *Analgesia--spinal   *Bowel prep--fleets enema prep   *Positioning--prone    Future Appointments   Date Time Provider Department Center   10/28/2018  2:00 PM NURSE PREOP TELEPHONE SCREEN [facility]   11/30/2018  2:45 PM Mantyh, Marchia Meiershristopher Ritchie, MD [facility]     DIEGO SCHAPS, Med Student    Attending Attestation:    I discussed the plan above with the patient.  Jaxson Presas expressed an understanding of this discussion and recommendations above.  All questions were answered to the patient's satisfaction    Agree, J pouch mucosa prolapse.  [facility]      .I confirm that I reviewed the patient's history and other pertinent data along with a member of our house staff. I confirmed the evaluation and/or treatment plan of house staff.  Any exceptions as noted by the attending.    Clifton Jameshristopher Ritchie Mantyh, MD  Electronically signed by Clifton JamesMantyh, Christopher Ritchie, MD at 10/27/2018 10:09 AM EDT

## 2020-07-30 NOTE — Progress Notes (Signed)
Formatting of this note might be different from the original.    Self Swab Type: Anterior Nasal  Electronically signed by Reine Just, MD at 07/30/2020  3:56 PM EDT

## 2020-11-01 DIAGNOSIS — C20 Malignant neoplasm of rectum: Secondary | ICD-10-CM | POA: Diagnosis not present

## 2020-11-01 DIAGNOSIS — K219 Gastro-esophageal reflux disease without esophagitis: Secondary | ICD-10-CM | POA: Diagnosis not present

## 2020-11-01 DIAGNOSIS — R198 Other specified symptoms and signs involving the digestive system and abdomen: Secondary | ICD-10-CM | POA: Diagnosis not present

## 2020-11-01 DIAGNOSIS — E611 Iron deficiency: Secondary | ICD-10-CM | POA: Diagnosis not present

## 2020-11-01 DIAGNOSIS — R159 Full incontinence of feces: Secondary | ICD-10-CM | POA: Diagnosis not present

## 2020-11-02 ENCOUNTER — Encounter: Payer: Self-pay | Admitting: Gastroenterology

## 2020-11-07 DIAGNOSIS — H40013 Open angle with borderline findings, low risk, bilateral: Secondary | ICD-10-CM | POA: Diagnosis not present

## 2020-11-07 DIAGNOSIS — H5712 Ocular pain, left eye: Secondary | ICD-10-CM | POA: Diagnosis not present

## 2020-11-07 DIAGNOSIS — H04123 Dry eye syndrome of bilateral lacrimal glands: Secondary | ICD-10-CM | POA: Diagnosis not present

## 2020-11-14 ENCOUNTER — Telehealth: Payer: Self-pay

## 2020-11-14 NOTE — Telephone Encounter (Signed)
Reviewed records from Dr. Glenna Durand office (NP Referral 11/27/20). Appears labs were ordered 09/2020 but not included within records. Called Dr. Glenna Durand office and requested labs be faxed to our office. States they will fax today. Will await lab results.

## 2020-11-19 DIAGNOSIS — N181 Chronic kidney disease, stage 1: Secondary | ICD-10-CM | POA: Diagnosis not present

## 2020-11-19 DIAGNOSIS — Z794 Long term (current) use of insulin: Secondary | ICD-10-CM | POA: Diagnosis not present

## 2020-11-19 DIAGNOSIS — E1122 Type 2 diabetes mellitus with diabetic chronic kidney disease: Secondary | ICD-10-CM | POA: Diagnosis not present

## 2020-11-27 ENCOUNTER — Ambulatory Visit: Payer: Medicare HMO | Admitting: Gastroenterology

## 2020-11-27 ENCOUNTER — Encounter: Payer: Self-pay | Admitting: Gastroenterology

## 2020-11-27 VITALS — BP 122/80 | HR 74 | Ht 70.0 in | Wt 216.6 lb

## 2020-11-27 DIAGNOSIS — K6289 Other specified diseases of anus and rectum: Secondary | ICD-10-CM

## 2020-11-27 DIAGNOSIS — R198 Other specified symptoms and signs involving the digestive system and abdomen: Secondary | ICD-10-CM

## 2020-11-27 DIAGNOSIS — K625 Hemorrhage of anus and rectum: Secondary | ICD-10-CM | POA: Diagnosis not present

## 2020-11-27 NOTE — Progress Notes (Signed)
Referring Provider: Wenda Low, MD Primary Care Physician:  Wenda Low, MD  Reason for Consultation:  Rectal pain and bleeding   IMPRESSION:  Rectal bleeding and change in bowel habits    - previously attributed to internal hemorrhoids and prolapsed J pouch History of colon polyps - tubular adenoma on colonoscopy 2016 History of rectal adenocarcinoma s/p chemoradiation and J pouch 2006 Microcytosis with normal hemoglobin    - iron 59, % sat 13  Chronic rectal bleeding following surgery for rectal adenocarcinoma: Differential for bleeding: Recurrent hemorrhoids, pouchitis, radiation proctitis, polyps, recurrent prolapse. Colonoscopy recommended for clarification.   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 Continue with stool bulking agent. No medication changes today, although he request colestipol instead of cholestyramine.    Records in [facility] note that he was to have an enema prior to an upcoming appointment with the colorectal clinic. The patient denies knowledge of this appointment or any recent contact with [facility].      PLAN: Colestipol 1g BID Colonoscopy/Endoscopic evaluation of J pouch  Follow-up with Dr. Hester Mates (please call [facility] to schedule this appointment) Referral to pelvic floor physical therapy after colonoscopy  Please see the "Patient Instructions" section for addition details about the plan.  I spent 60 minutes, including in depth chart review, face-to-face time with the patient, coordinating care, ordering studies and medications as appropriate, and documentation.    HPI: Phillip Tate is a 64 y.o. male referred by Dr. Lysle Rubens. The history is obtained through the patient, records provided by Dr. Lysle Rubens, records obtained from [facility] and review of his  electronic health record. He has arthritis, diabetes, and hypertension.  Followed by Dr. Hester Mates at [facility]. He was diagnosed with stage IIIA rectal adenocarcinoma 2006 s/p neoadjuvant chemoradio therapy, s/p intersphincteric proctectomy with colonic J-pouch and inverting loop ileostomy 10/2005, s/p loop ileostomy takedown 05/2006, hemorrhoidectomy 10/2018, and repair of prolapsed J pouch 11/2018.  Last seen by Dr. Hester Mates 10/04/19 for rectal bleeding. His notes:  "NED from a cancer standpoint but is fully incontinent and drains mucous per rectum daily. Sometimes mixed with blood. I have repaired a prolapse of this J pouch about a yr ago, that has completely resolved. Up to date on colonoscopy.  Procto today with normal J pouch, no bleeding, NED.  I think this is a functional issue with his J pouch. I have nothing to offer from a surgical standpoint. He understands and appreciates the scope and reassurance."  Is seen today for stool leakage with small amounts of bright red blood per rectum. Symptoms present since the surgery. But, occurring more frequently.  Reports 3-4 mucous stools with bloody streaking and leakage over the last several weeks. Associated rectal pain and burning. No abdominal pain or tenesmus.   Fecal soiling occurs with walking. Wears adult pull-ups. Started cholestyramine powder 2 weeks ago (insurance did not cover colestipol), as well as dicyclomine 20 mg PRN, pantroprazole 20 mg, lactulose.   Labs 10/18/20: hemoglobin 14.2, MCV 77.4, RDW 16.2, platelets 230, iron 59, saturation 13  Review of CareEverywhere notes:  Prior endoscopy in 2011 with Dr. Earle Gell with normal small bowel biopsies. Tubular adenoma with Dr. Colonoscopy 2016.  Dr. Clinton Gallant with colonoscopy 2019 with a 8mm sigmoid polyp removed from the sigmoid. Colonic mucosa appeared normal.  Surveillance recommended in 5 years.   No known family history of colon  cancer or polyps. No family history of uterine/endometrial  cancer, pancreatic cancer or gastric/stomach cancer.   Past Medical History:  Diagnosis Date  . Coronary artery disease   . DDD (degenerative disc disease), lumbar   . Diabetes mellitus without complication ([place])   . Dyslipidemia   . History of rectal cancer    dx 2006 --  Stage III, T3  N1  M0,  s/p  chemoradiation (07-10-2005 to 09-05-2005) and anterior colon resection 11-04-2006  . History of shingles   . Hyperlipidemia   . Hypertension   . Iron deficiency anemia    intolerent PO iron--  gets IV iron,  last one 03-12-2015 ([place])  . Organic impotence   . Rectal cancer ([place])    adenocarcinoma stage lll    Past Surgical History:  Procedure Laterality Date  . ANTERIOR COLON RESECTION W/ COLOSTOMY  11-04-2006  . CATARACT EXTRACTION W/ INTRAOCULAR LENS IMPLANT Right sept 2015  &  feb 2016  . COLONOSCOPY WITH ESOPHAGOGASTRODUODENOSCOPY (EGD)  last one 03-07-2010  . COLONOSCOPY WITH PROPOFOL N/A 10/23/2015   Procedure: COLONOSCOPY WITH PROPOFOL;  Surgeon: Garlan Fair, MD;  Location: [facility];  Service: Endoscopy;  Laterality: N/A;  . COLOSTOMY TAKEDOWN  june 2007  . EXAMINATION UNDER ANESTHESIA N/A 04/23/2015   Procedure: EXAM UNDER ANESTHESIA, MANIPULATION OF PENILE PROSTHESIS PUMP;  Surgeon: Kathie Rhodes, MD;  Location: [place];  Service: Urology;  Laterality: N/A;  . PENILE PROSTHESIS PLACEMENT  10-29-2009  . PORT A CATH PLACEMENT  07-23-2005    Current Outpatient Medications  Medication Sig Dispense Refill  . amLODipine (NORVASC) 10 MG tablet Take 10 mg by mouth every morning.     . dicyclomine (BENTYL) 20 MG tablet Take 20 mg by mouth every 6 (six) hours. As needed    . empagliflozin (JARDIANCE) 25 MG TABS tablet Take 25 mg by mouth daily.    . fish oil-omega-3 fatty acids 1000 MG capsule Take 1,500 mg by mouth daily.    Marland Kitchen gabapentin (NEURONTIN) 300 MG capsule Take 300 mg by mouth 3 (three) times daily.   0  . ibuprofen (ADVIL,MOTRIN) 200 MG  tablet Take 200 mg by mouth every 6 (six) hours as needed for fever or moderate pain.    Marland Kitchen insulin glargine (LANTUS) 100 UNIT/ML injection Inject 40-42 Units into the skin at bedtime.     . insulin lispro (HUMALOG) 100 UNIT/ML injection Inject 16-40 Units into the skin 3 (three) times daily before meals. Sliding scan  16units am, 25-30units lunch. 35-40units supper    . INVOKANA 300 MG TABS tablet Take 300 mg by mouth daily before breakfast.   2  . labetalol (NORMODYNE) 300 MG tablet Take 300 mg by mouth 2 (two) times daily.    Marland Kitchen lactulose (CEPHULAC) 10 g packet Take 10 g by mouth every other day.    . lisinopril (PRINIVIL,ZESTRIL) 40 MG tablet Take 40 mg by mouth every evening.     . metFORMIN (GLUCOPHAGE) 1000 MG tablet Take 1,000 mg by mouth 2 (two) times daily with a meal.    . Multiple Vitamin (MULTIVITAMIN) tablet Take 1 tablet by mouth daily.    Marland Kitchen NOVOFINE 30G X 8 MM MISC Inject 1 packet into the skin as needed. BLOOD SUGARS  2  . NOVOLOG FLEXPEN 100 UNIT/ML FlexPen Inject 90 Units into the skin 3 (three) times daily with meals.   2  . pantoprazole (PROTONIX) 20 MG tablet Take 20 mg by mouth daily.    Marland Kitchen  polyvinyl alcohol (LIQUIFILM TEARS) 1.4 % ophthalmic solution Place 1 drop into both eyes as needed for dry eyes.    . rosuvastatin (CRESTOR) 10 MG tablet Take 1 tablet (10 mg total) by mouth daily. 90 tablet 3  . VALACYCLOVIR HCL PO Take 1 mg by mouth daily.    Marland Kitchen zolpidem (AMBIEN) 10 MG tablet Take 10 mg by mouth at bedtime.     No current facility-administered medications for this visit.    Allergies as of 11/27/2020 - Review Complete 04/15/2017  Allergen Reaction Noted  . Cardizem cd [diltiazem hcl er beads] Swelling 07/04/2013  . Toprol xl [metoprolol] Other (See Comments) 07/04/2013  . Tricor [fenofibrate] Other (See Comments) 07/04/2013    Family History  Problem Relation Age of Onset  . Hypertension Father   . Diabetes Mother   . Heart attack Neg Hx     Social History    Socioeconomic History  . Marital status: Married    Spouse name: Not on file  . Number of children: Not on file  . Years of education: Not on file  . Highest education level: Not on file  Occupational History  . Not on file  Tobacco Use  . Smoking status: Never Smoker  . Smokeless tobacco: Never Used  Substance and Sexual Activity  . Alcohol use: Yes    Comment: Rarely  . Drug use: No  . Sexual activity: Not on file  Other Topics Concern  . Not on file  Social History Narrative  . Not on file   Social Determinants of Health   Financial Resource Strain:   . Difficulty of Paying Living Expenses: Not on file  Food Insecurity:   . Worried About Charity fundraiser in the Last Year: Not on file  . Ran Out of Food in the Last Year: Not on file  Transportation Needs:   . Lack of Transportation (Medical): Not on file  . Lack of Transportation (Non-Medical): Not on file  Physical Activity:   . Days of Exercise per Week: Not on file  . Minutes of Exercise per Session: Not on file  Stress:   . Feeling of Stress : Not on file  Social Connections:   . Frequency of Communication with Friends and Family: Not on file  . Frequency of Social Gatherings with Friends and Family: Not on file  . Attends Religious Services: Not on file  . Active Member of Clubs or Organizations: Not on file  . Attends Archivist Meetings: Not on file  . Marital Status: Not on file  Intimate Partner Violence:   . Fear of Current or Ex-Partner: Not on file  . Emotionally Abused: Not on file  . Physically Abused: Not on file  . Sexually Abused: Not on file    Review of Systems: 12 system ROS is negative except as noted above with the addition of allergies, headaches, and sore thoat.   Physical Exam: General:   Alert,  well-nourished, pleasant and cooperative in NAD Head:  Normocephalic and atraumatic. Eyes:  Sclera clear, no icterus.   Conjunctiva pink. Ears:  Normal auditory  acuity. Nose:  No deformity, discharge,  or lesions. Mouth:  No deformity or lesions.   Neck:  Supple; no masses or thyromegaly. Lungs:  Clear throughout to auscultation.   No wheezes. Heart:  Regular rate and rhythm; no murmurs. Abdomen:  Soft, nontender, nondistended, normal bowel sounds, no rebound or guarding. No hepatosplenomegaly.   Rectal:  Deferred to colonoscopy. Msk:  Symmetrical. No boney deformities LAD: No inguinal or umbilical LAD Extremities:  No clubbing or edema. Neurologic:  Alert and  oriented x4;  grossly nonfocal Skin:  Intact without significant lesions or rashes. Psych:  Alert and cooperative. Normal mood and affect.     Phillip Chatmon L. Tarri Glenn, MD, MPH 11/27/2020, 10:25 AM

## 2020-11-27 NOTE — Patient Instructions (Addendum)
I have recommended a colonoscopy to evaluate your rectal pain and change in bowel habits. However, with your history, Please call Dr. Hester Mates at [facility] to make a follow-up appointment.   Will see if your insurance will cover colestipol as an alternative to cholestyramine.   You have been scheduled for a colonoscopy. Please follow written instructions given to you at your visit today.  Please pick up your prep supplies at the pharmacy within the next 1-3 days. If you use inhalers (even only as needed), please bring them with you on the day of your procedure.  We have given you a sample PLENVU prep kit today  Due to recent changes in healthcare laws, you may see the results of your imaging and laboratory studies on MyChart before your provider has had a chance to review them.  We understand that in some cases there may be results that are confusing or concerning to you. Not all laboratory results come back in the same time frame and the provider may be waiting for multiple results in order to interpret others.  Please give [place] 48 hours in order for your provider to thoroughly review all the results before contacting the office for clarification of your results.

## 2020-11-28 ENCOUNTER — Other Ambulatory Visit: Payer: Self-pay

## 2020-11-28 ENCOUNTER — Ambulatory Visit (AMBULATORY_SURGERY_CENTER): Payer: Medicare HMO | Admitting: Gastroenterology

## 2020-11-28 ENCOUNTER — Encounter: Payer: Self-pay | Admitting: Gastroenterology

## 2020-11-28 ENCOUNTER — Telehealth: Payer: Self-pay

## 2020-11-28 VITALS — BP 139/72 | HR 59 | Temp 97.1°F | Resp 12 | Ht 70.0 in | Wt 216.0 lb

## 2020-11-28 DIAGNOSIS — K625 Hemorrhage of anus and rectum: Secondary | ICD-10-CM | POA: Diagnosis not present

## 2020-11-28 DIAGNOSIS — R159 Full incontinence of feces: Secondary | ICD-10-CM

## 2020-11-28 DIAGNOSIS — R194 Change in bowel habit: Secondary | ICD-10-CM

## 2020-11-28 DIAGNOSIS — K635 Polyp of colon: Secondary | ICD-10-CM

## 2020-11-28 DIAGNOSIS — K6289 Other specified diseases of anus and rectum: Secondary | ICD-10-CM

## 2020-11-28 DIAGNOSIS — I10 Essential (primary) hypertension: Secondary | ICD-10-CM | POA: Diagnosis not present

## 2020-11-28 DIAGNOSIS — E119 Type 2 diabetes mellitus without complications: Secondary | ICD-10-CM | POA: Diagnosis not present

## 2020-11-28 DIAGNOSIS — D124 Benign neoplasm of descending colon: Secondary | ICD-10-CM

## 2020-11-28 DIAGNOSIS — D125 Benign neoplasm of sigmoid colon: Secondary | ICD-10-CM

## 2020-11-28 DIAGNOSIS — D123 Benign neoplasm of transverse colon: Secondary | ICD-10-CM

## 2020-11-28 MED ORDER — AMBULATORY NON FORMULARY MEDICATION: 1 refills | Status: DC

## 2020-11-28 MED ORDER — SODIUM CHLORIDE 0.9 % IV SOLN: 500.0000 mL | Freq: Once | INTRAVENOUS | Status: DC

## 2020-11-28 MED ORDER — AMBULATORY NON FORMULARY MEDICATION
1 refills | Status: DC
Start: 2020-11-28 — End: 2020-11-28

## 2020-11-28 MED ORDER — AMBULATORY NON FORMULARY MEDICATION
0 refills | Status: DC
Start: 2020-11-28 — End: 2022-05-09

## 2020-11-28 NOTE — Telephone Encounter (Signed)
Referral entered in epic. 

## 2020-11-28 NOTE — Progress Notes (Signed)
Pt's states no medical or surgical changes since previsit or office visit.  CW - vitals 

## 2020-11-28 NOTE — Telephone Encounter (Signed)
-----   Message from Thornton Park, MD sent at 11/28/2020  8:48 AM EST ----- Please arrange referral to pelvic floor physical therapy for evaluation and treatment of post-surgical incontinence.  Thank you.

## 2020-11-28 NOTE — Progress Notes (Signed)
Called to room to assist during endoscopic procedure.  Patient ID and intended procedure confirmed with present staff. Received instructions for my participation in the procedure from the performing physician.  

## 2020-11-28 NOTE — Op Note (Signed)
[place] Patient Name: Phillip Tate Procedure Date: 11/28/2020 8:10 AM MRN: [id number] Endoscopist: Thornton Park MD, MD Age: 64 Referring MD:  Date of Birth: [birth year] Gender: Male Account #: [id number] Procedure:                Colonoscopy Indications:              Rectal bleeding and change in bowel habits                           - previously attributed to internal hemorrhoids and                            prolapsed J pouch                           History of rectal adenocarcinoma s/p chemoradiation                            and J pouch 2006                           History of colon polyps - tubular adenoma on                            colonoscopy 2016                           Microcytosis with normal hemoglobin Medicines:                Monitored Anesthesia Care Procedure:                Pre-Anesthesia Assessment:                           - Prior to the procedure, a History and Physical                            was performed, and patient medications and                            allergies were reviewed. The patient's tolerance of                            previous anesthesia was also reviewed. The risks                            and benefits of the procedure and the sedation                            options and risks were discussed with the patient.                            All questions were answered, and informed consent                            was obtained. Prior Anticoagulants: The patient  has                            taken no previous anticoagulant or antiplatelet                            agents. ASA Grade Assessment: II - A patient with                            mild systemic disease. After reviewing the risks                            and benefits, the patient was deemed in                            satisfactory condition to undergo the procedure.                           After obtaining informed consent, the  colonoscope                            was passed under direct vision. Throughout the                            procedure, the patient's blood pressure, pulse, and                            oxygen saturations were monitored continuously. The                            Colonoscope was introduced through the anus and                            advanced to the 3 cm into the ileum. A second                            forward view of the right colon was performed. The                            colonoscopy was performed without difficulty. The                            patient tolerated the procedure well. The quality                            of the bowel preparation was good. The terminal                            ileum, ileocecal valve, appendiceal orifice, and                            rectum were photographed. Scope In: 8:14:26 AM Scope Out: 8:30:11 AM Scope Withdrawal Time: 0 hours 13 minutes 57 seconds  Total Procedure Duration: 0 hours  15 minutes 45 seconds  Findings:                 The digital rectal exam findings include decreased                            sphincter tone. There is a small tear in the 4                            o'clock position. No active bleeding present.                           There was evidence of a prior end-to-end                            colo-rectal anastomosis in the rectum. There very                            mild, patchy erythema seen in the pouch of unclear                            clinical significance. Biopsies were taken with a                            cold forceps for histology. Estimated blood loss                            was minimal.                           Three flat polyps were found in the sigmoid colon                            and descending colon. These polyps were small and                            unusually erythematous appearing The polyps were 1                            to 2 mm in size. These polyps were removed  with a                            cold snare. Resection and retrieval were complete.                            Estimated blood loss was minimal.                           A 2 mm polyp was found in the splenic flexure. The                            polyp was sessile. The polyp was removed with a  cold snare. Resection and retrieval were complete.                            Estimated blood loss was minimal.                           The exam was otherwise without abnormality on                            direct and retroflexion views including evaluation                            of the distal terminal ileum. Complications:            No immediate complications. Estimated blood loss:                            Minimal. Estimated Blood Loss:     Estimated blood loss was minimal. Impression:               - Decreased sphincter tone found on digital rectal                            exam.                           - Small anal fissure without active bleeding.                            Warrants close follow-up given the unusual location.                           - Patent end-to-end colo-rectal anastomosis,                            characterized by healthy appearing mucosa.                           - Mild erythema in the pouch of unclear clinical                            significance. Biopsied.                           - Three 1 to 2 mm polyps in the sigmoid colon and                            in the descending colon, removed with a cold snare.                            Resected and retrieved.                           - One 2 mm polyp at the splenic flexure, removed  with a cold snare. Resected and retrieved.                           - The examination was otherwise normal on direct                            and retroflexion views. Recommendation:           - Patient has a contact number available for                             emergencies. The signs and symptoms of potential                            delayed complications were discussed with the                            patient. Return to normal activities tomorrow.                            Written discharge instructions were provided to the                            patient.                           - Resume previous diet.                           - Continue present medications including                            cholestyramine.                           - Add a daily stool bulking agent such at Metamucil                            or Benefiber.                           - Nitroglycerin 0.125% gel applied to the rectum                            TID x 6-8 weeks (not to be used with concurrent                            phosphodiesterase inhibitors)                           - Await pathology results.                           - Repeat colonoscopy date determined after pending                            pathology results are  reviewed for surveillance.                           - Emerging evidence supports eating a diet of                            fruits, vegetables, grains, calcium, and yogurt                            while reducing red meat and alcohol may reduce the                            risk of colon cancer.                           - Given these results, all first degree relatives                            (brothers, sisters, children, parents) should start                            colon cancer screening at age 59.                           - Referral for pelvic floor physical therapy re:                            incontinence following surgery                           - Please call Dr. Hester Mates at [facility] to arrange                            follow-up given the results on the examination                            today. Thornton Park MD, MD 11/28/2020 8:48:08 AM This report has been signed electronically.

## 2020-11-28 NOTE — Patient Instructions (Signed)
Nitroglycerin 0.125% gel. Apply pea size amount to the rectum three times a day as needed    Handout on polyps given.  Add metamucil or Benefiber daily.  [facility].   YOU HAD AN ENDOSCOPIC PROCEDURE TODAY AT [facility]:   Refer to the procedure report that was given to you for any specific questions about what was found during the examination.  If the procedure report does not answer your questions, please call your gastroenterologist to clarify.  If you requested that your care partner not be given the details of your procedure findings, then the procedure report has been included in a sealed envelope for you to review at your convenience later.  YOU SHOULD EXPECT: Some feelings of bloating in the abdomen. Passage of more gas than usual.  Walking can help get rid of the air that was put into your GI tract during the procedure and reduce the bloating. If you had a lower endoscopy (such as a colonoscopy or flexible sigmoidoscopy) you may notice spotting of blood in your stool or on the toilet paper. If you underwent a bowel prep for your procedure, you may not have a normal bowel movement for a few days.  Please Note:  You might notice some irritation and congestion in your nose or some drainage.  This is from the oxygen used during your procedure.  There is no need for concern and it should clear up in a day or so.  SYMPTOMS TO REPORT IMMEDIATELY:   Following lower endoscopy (colonoscopy or flexible sigmoidoscopy):  Excessive amounts of blood in the stool  Significant tenderness or worsening of abdominal pains  Swelling of the abdomen that is new, acute  Fever of 100F or higher   For urgent or emergent issues, a gastroenterologist can be reached at any hour by calling [phone]. Do not use MyChart messaging for urgent concerns.    DIET:  We do recommend a small meal at first, but then you may proceed to your regular diet.  Drink  plenty of fluids but you should avoid alcoholic beverages for 24 hours.  ACTIVITY:  You should plan to take it easy for the rest of today and you should NOT DRIVE or use heavy machinery until tomorrow (because of the sedation medicines used during the test).    FOLLOW UP: Our staff will call the number listed on your records 48-72 hours following your procedure to check on you and address any questions or concerns that you may have regarding the information given to you following your procedure. If we do not reach you, we will leave a message.  We will attempt to reach you two times.  During this call, we will ask if you have developed any symptoms of COVID 19. If you develop any symptoms (ie: fever, flu-like symptoms, shortness of breath, cough etc.) before then, please call [phone].  If you test positive for Covid 19 in the 2 weeks post procedure, please call and report this information to [place].    If any biopsies were taken you will be contacted by phone or by letter within the next 1-3 weeks.  Please call us at [phone] if you have not heard about the biopsies in 3 weeks.    SIGNATURES/CONFIDENTIALITY: You and/or your care partner have signed paperwork which will be entered into your electronic medical record.  These signatures attest to the fact that that the information above on your After Visit Summary has  been reviewed and is understood.  Full responsibility of the confidentiality of this discharge information lies with you and/or your care-partner.

## 2020-11-28 NOTE — Progress Notes (Signed)
Report to PACU, RN, vss, BBS= Clear.  

## 2020-11-30 ENCOUNTER — Telehealth: Payer: Self-pay

## 2020-11-30 ENCOUNTER — Telehealth: Payer: Self-pay | Admitting: *Deleted

## 2020-11-30 NOTE — Telephone Encounter (Signed)
Called [phone] and left a message we tried to reach pt for a follow up call. maw

## 2020-11-30 NOTE — Telephone Encounter (Signed)
  Follow up Call-  Call back number 11/28/2020  Post procedure Call Back phone  # [phone]  Permission to leave phone message Yes  Some recent data might be hidden     Patient questions:  Do you have a fever, pain , or abdominal swelling? No. Pain Score  0 *  Have you tolerated food without any problems? Yes.    Have you been able to return to your normal activities? Yes.    Do you have any questions about your discharge instructions: Diet   No. Medications  No. Follow up visit  No.  Do you have questions or concerns about your Care? No.  Actions: * If pain score is 4 or above: No action needed, pain <4.  1. Have you developed a fever since your procedure? no  2.   Have you had an respiratory symptoms (SOB or cough) since your procedure? no  3.   Have you tested positive for COVID 19 since your procedure no  4.   Have you had any family members/close contacts diagnosed with the COVID 19 since your procedure?  no   If yes to any of these questions please route to Joylene John, RN and Joella Prince, RN

## 2020-12-04 ENCOUNTER — Encounter: Payer: Self-pay | Admitting: Gastroenterology

## 2020-12-05 ENCOUNTER — Other Ambulatory Visit: Payer: Self-pay | Admitting: *Deleted

## 2020-12-05 ENCOUNTER — Encounter: Payer: Self-pay | Admitting: Physical Therapy

## 2020-12-05 ENCOUNTER — Ambulatory Visit: Payer: Medicare HMO | Attending: Gastroenterology | Admitting: Physical Therapy

## 2020-12-05 ENCOUNTER — Other Ambulatory Visit: Payer: Self-pay

## 2020-12-05 DIAGNOSIS — R278 Other lack of coordination: Secondary | ICD-10-CM | POA: Insufficient documentation

## 2020-12-05 DIAGNOSIS — M6281 Muscle weakness (generalized): Secondary | ICD-10-CM | POA: Diagnosis not present

## 2020-12-05 NOTE — Therapy (Signed)
[facility] [address], [place], [place], [address] Phone: [phone]   Fax:  [phone]  Physical Therapy Evaluation  Patient Details  Name: Phillip Tate MRN: [id number] Date of Birth: [birth year] Referring Provider (PT): Dr. Thornton Park   Encounter Date: 12/05/2020   PT End of Session - 12/05/20 1709    Visit Number 1    Date for PT Re-Evaluation 02/27/21    Authorization Type humana medicare    PT Start Time 2353    PT Stop Time 1530    PT Time Calculation (min) 45 min    Activity Tolerance Patient tolerated treatment well;No increased pain    Behavior During Therapy WFL for tasks assessed/performed           Past Medical History:  Diagnosis Date  . Coronary artery disease   . DDD (degenerative disc disease), lumbar   . Diabetes mellitus without complication ([place])   . Dyslipidemia   . History of rectal cancer    dx 2006 --  Stage III, T3  N1  M0,  s/p  chemoradiation (07-10-2005 to 09-05-2005) and anterior colon resection 11-04-2006  . History of shingles   . Hyperlipidemia   . Hypertension   . Iron deficiency anemia    intolerent PO iron--  gets IV iron,  last one 03-12-2015 ([facility])  . Organic impotence   . Rectal cancer ([place])    adenocarcinoma stage lll    Past Surgical History:  Procedure Laterality Date  . ANTERIOR COLON RESECTION W/ COLOSTOMY  11-04-2006  . CATARACT EXTRACTION W/ INTRAOCULAR LENS IMPLANT Right sept 2015  &  feb 2016  . COLONOSCOPY WITH ESOPHAGOGASTRODUODENOSCOPY (EGD)  last one 03-07-2010  . COLONOSCOPY WITH PROPOFOL N/A 10/23/2015   Procedure: COLONOSCOPY WITH PROPOFOL;  Surgeon: Garlan Fair, MD;  Location: [facility];  Service: Endoscopy;  Laterality: N/A;  . COLOSTOMY TAKEDOWN  june 2007  . EXAMINATION UNDER ANESTHESIA N/A 04/23/2015   Procedure: EXAM UNDER ANESTHESIA, MANIPULATION OF PENILE PROSTHESIS PUMP;  Surgeon: Kathie Rhodes, MD;  Location:  [facility];  Service: Urology;  Laterality: N/A;  . PENILE PROSTHESIS PLACEMENT  10-29-2009  . PORT A CATH PLACEMENT  07-23-2005  . prolapsed hemorrhoid      There were no vitals filed for this visit.    Subjective Assessment - 12/05/20 1448    Subjective Patient wears 4 adult diapers per day. Patient does not always gets the sense he is to have a bowel movement and will leak. Patient will do a clean out to get all of the stool out every 3 days. Patient has a fissuer in the rectum area. Patient still has mucous coming out.    Currently in Pain? Yes    Pain Score 5     Pain Location Rectum    Pain Orientation Mid    Pain Descriptors / Indicators Burning    Pain Type Chronic pain    Pain Onset More than a month ago    Pain Frequency Constant    Aggravating Factors  while the mucous comes out of the rectum    Pain Relieving Factors uses an acid medicine    Multiple Pain Sites No              [facility] PT Assessment - 12/05/20 0001      Assessment   Medical Diagnosis R15.9 Incontinence of feces, unspecified fecal incontinence    Referring Provider (PT) Dr. Thornton Park  Onset Date/Surgical Date --   2006   Prior Therapy none      Precautions   Precautions Other (comment)    Precaution Comments rectal cancer      Restrictions   Weight Bearing Restrictions No      Balance Screen   Has the patient fallen in the past 6 months No    Has the patient had a decrease in activity level because of a fear of falling?  No    Is the patient reluctant to leave their home because of a fear of falling?  No      Home Ecologist residence      Prior Function   Level of Independence Independent    Vocation Requirements sitting alot and sits on a pillow that is full    Leisure walk 1/2 mile with dog      Cognition   Overall Cognitive Status Within Functional Limits for tasks assessed      Posture/Postural Control   Posture/Postural  Control No significant limitations      ROM / Strength   AROM / PROM / Strength AROM;PROM;Strength      AROM   Lumbar Extension decreased by 50%    Lumbar - Right Side Bend decreased by 50%    Lumbar - Left Side Bend decreased by 50%    Lumbar - Right Rotation decreased by 50%    Lumbar - Left Rotation decreased by 50%      PROM   Right Hip Flexion 100    Right Hip Internal Rotation  5    Right Hip ABduction 15    Left Hip Flexion 100    Left Hip Internal Rotation  10    Left Hip ABduction 25      Strength   Overall Strength Comments abdominal strength is 2/5; when ask to contract the abdominals he will lift his ribs and hold his breath    Right Hip Flexion 4/5    Left Hip Flexion 4/5      Palpation   Palpation comment fascial tightness in the right lower abdominal                       Objective measurements completed on examination: See above findings.     Pelvic Floor Special Questions - 12/05/20 0001    Fecal incontinence Yes    Skin Integrity Other   anal skin has increased ridges, dark from radiation   Perineal Body/Introitus  Gaping   anal region   External Palpation tightness in the perineal body, anterior anal canal    Pelvic Floor Internal Exam Patient confirms identification    Exam Type Rectal    Palpation tightness in the anterior rectal area; the rectum bulges, the EAS does not contract well, the puborectalis forward, tightness in the perineal body    Strength weak squeeze, no lift                    PT Education - 12/05/20 1531    Education Details Access Code: [facility]    Person(s) Educated Patient    Methods Explanation;Demonstration;Handout    Comprehension Verbalized understanding;Returned demonstration            PT Short Term Goals - 12/05/20 1722      PT SHORT TERM GOAL #1   Title Patient independent with initial summary    Time 4    Period Weeks  Status New    Target Date 01/02/21      PT SHORT TERM GOAL  #2   Title able to brace his abdominal correctly with core work    Time 4    Period Weeks    Status New    Target Date 01/02/21      PT SHORT TERM GOAL #3   Title understand ways to care for the rectum to reduce the burning    Time 4    Period Weeks    Status New    Target Date 01/02/21             PT Long Term Goals - 12/05/20 1724      PT LONG TERM GOAL #1   Title independent with advanced HEP    Time 12    Period Weeks    Status New    Target Date 02/27/21      PT LONG TERM GOAL #2   Title able to contract the pelvic floor with a lift to increase tone of the pelvic floor and reduce the gapping of the anus    Time 12    Period Weeks    Status New    Target Date 02/27/21      PT LONG TERM GOAL #3   Title fecal leakage decreased >/= 50% due to increased tone and strength of the anus    Time 12    Period Weeks    Status New    Target Date 02/27/21      PT LONG TERM GOAL #4   Title sensitivity of the anus increased so patient is able to feel the stool come out of the anus 50% of the time    Time 12    Period Weeks    Status New    Target Date 02/27/21      PT LONG TERM GOAL #5   Title ------                  Plan - 12/05/20 1709    Clinical Impression Statement Patient is a 64 year old male s/p rectal cancer with radiation and chemotherapy. Patient had a anterior colon resection and s/p loop ilieostomy take down. Patient burning rectal pain is 5/10 constantly especially with bowel movements. Pelvic floor strenth is 2/5. He has minimal to no contraction of the external anal sphincter. The puborectalis muscle does pull forward with contraction and the patient does engage the lower abdominals. Patient has incresaed tissue around the anus and needs verbal cues to not bulge the anus. Patient has tightness in the anterior wall of the anus and perineal body. He has decreased movement of bilateral hip flexion, abduction and internal rotation. Bilateral hip flexion  is 4/5. Patient lumbar extension decreased by 505. He uses 4 adult diapers per day. Patient reports he does not always have the sensation he has to have a bowel movement and will have fecal leakage. Patient has continous muscous leakage coming out. Patient will benefit from skilled therapy to reduce the fecal leakage, increase rectal tone, and improve core strength.    Personal Factors and Comorbidities Comorbidity 2;Transportation    Comorbidities rectal cancer with chemotherapy and radiation; s/p anterior colon resection; s/p loop ilieostomy takedown    Examination-Activity Limitations Locomotion Level;Continence;Toileting;Squat    Examination-Participation Restrictions Interpersonal Relationship;Community Activity    Stability/Clinical Decision Making Evolving/Moderate complexity    Clinical Decision Making Low    Rehab Potential Excellent    PT Frequency 1x /  week    PT Duration 12 weeks    PT Treatment/Interventions Biofeedback;Electrical Stimulation;Neuromuscular re-education;Therapeutic exercise;Therapeutic activities;Patient/family education;Manual techniques;Scar mobilization;Spinal Manipulations    PT Next Visit Plan hip stretches; fascial work to the lower right abdominals; pelvic floor contraction with abdominal bracing; see if the [place] reveleum helps; toileting to reduce bulging of the rectum    PT Home Exercise Plan Access Code: [facility]    Consulted and Agree with Plan of Care Patient           Patient will benefit from skilled therapeutic intervention in order to improve the following deficits and impairments:  Decreased coordination, Increased fascial restricitons, Decreased endurance, Increased muscle spasms, Pain, Decreased activity tolerance, Decreased mobility, Decreased strength, Decreased scar mobility  Visit Diagnosis: Muscle weakness (generalized) - Plan: PT plan of care cert/re-cert  Other lack of coordination - Plan: PT plan of care  cert/re-cert     Problem List Patient Active Problem List   Diagnosis Date Noted  . Essential hypertension 09/18/2016  . Hyperlipidemia 09/18/2016  . Diabetes mellitus, type 2 ([place]) 10/11/2015    Earlie Counts, PT 12/05/20 5:30 PM   [facility] [address] [place], [place], [address] Phone: [phone]   Fax:  [phone]  Name: Phillip Tate MRN: [id number] Date of Birth: 1956-06-22

## 2020-12-05 NOTE — Patient Instructions (Signed)
Access Code: [facility] URL: [email or web address] Date: 12/05/2020 Prepared by: Earlie Counts  Exercises Sidelying Pelvic Floor Contraction with Self-Palpation - 2 x daily - 7 x weekly - 1 sets - 10 reps - 5 sec hold [facility] [address], [place], [place] [address] Phone # [phone] Fax [phone]

## 2020-12-17 ENCOUNTER — Ambulatory Visit: Payer: Medicare HMO | Admitting: Physical Therapy

## 2020-12-17 ENCOUNTER — Other Ambulatory Visit: Payer: Self-pay

## 2020-12-17 ENCOUNTER — Encounter: Payer: Self-pay | Admitting: Physical Therapy

## 2020-12-17 DIAGNOSIS — R278 Other lack of coordination: Secondary | ICD-10-CM

## 2020-12-17 DIAGNOSIS — M6281 Muscle weakness (generalized): Secondary | ICD-10-CM

## 2020-12-17 NOTE — Patient Instructions (Signed)
Access Code: [email or web address] URL: [email or web address] Date: 12/17/2020 Prepared by: Earlie Counts  Exercises Seated Piriformis Stretch with Trunk Bend - 1 x daily - 7 x weekly - 1 sets - 2 reps - 30 sec hold Seated Hamstring Stretch - 1 x daily - 7 x weekly - 1 sets - 2 reps - 30 sec hold Seated Hip Flexor Stretch - 1 x daily - 7 x weekly - 1 sets - 2 reps - 30 sec hold Seated Hip Adductor Stretch - 1 x daily - 7 x weekly - 1 sets - 2 reps - 30 sec hold Supine Abdominal Wall Massage - 1 x daily - 7 x weekly - 1 sets - 30 reps [facility] [address], [place], [place] [address] Phone # [phone] Fax [phone]

## 2020-12-17 NOTE — Therapy (Addendum)
[facility] [address], [place], [place], [address] Phone: [phone]   Fax:  [phone]  Physical Therapy Treatment  Patient Details  Name: Phillip Tate MRN: [id number] Date of Birth: 09/07/1956 Referring Provider (PT): Dr. Thornton Park   Encounter Date: 12/17/2020   PT End of Session - 12/17/20 1144    Visit Number 2    Authorization Type humana medicare    Authorization Time Period 12/8-02/27/21    Authorization - Visit Number 1    Authorization - Number of Visits 12    PT Start Time 1100    PT Stop Time 1140    PT Time Calculation (min) 40 min    Activity Tolerance Patient tolerated treatment well;No increased pain    Behavior During Therapy WFL for tasks assessed/performed           Past Medical History:  Diagnosis Date  . Coronary artery disease   . DDD (degenerative disc disease), lumbar   . Diabetes mellitus without complication ([place])   . Dyslipidemia   . History of rectal cancer    dx 2006 --  Stage III, T3  N1  M0,  s/p  chemoradiation (07-10-2005 to 09-05-2005) and anterior colon resection 11-04-2006  . History of shingles   . Hyperlipidemia   . Hypertension   . Iron deficiency anemia    intolerent PO iron--  gets IV iron,  last one 03-12-2015 ([facility])  . Organic impotence   . Rectal cancer ([place])    adenocarcinoma stage lll    Past Surgical History:  Procedure Laterality Date  . ANTERIOR COLON RESECTION W/ COLOSTOMY  11-04-2006  . CATARACT EXTRACTION W/ INTRAOCULAR LENS IMPLANT Right sept 2015  &  feb 2016  . COLONOSCOPY WITH ESOPHAGOGASTRODUODENOSCOPY (EGD)  last one 03-07-2010  . COLONOSCOPY WITH PROPOFOL N/A 10/23/2015   Procedure: COLONOSCOPY WITH PROPOFOL;  Surgeon: Garlan Fair, MD;  [facility];  Service: Endoscopy;  Laterality: N/A;  . COLOSTOMY TAKEDOWN  june 2007  . EXAMINATION UNDER ANESTHESIA N/A 04/23/2015   Procedure: EXAM UNDER ANESTHESIA,  MANIPULATION OF PENILE PROSTHESIS PUMP;  Surgeon: Kathie Rhodes, MD;  Location: [facility];  Service: Urology;  Laterality: N/A;  . PENILE PROSTHESIS PLACEMENT  10-29-2009  . PORT A CATH PLACEMENT  07-23-2005  . prolapsed hemorrhoid      There were no vitals filed for this visit.   Subjective Assessment - 12/17/20 1104    Subjective Ihave had several bad days. The [facility] has helped some. I have used 4 pullups this morning.    Patient Stated Goals reduce the leakage    Currently in Pain? Yes    Pain Score 5     Pain Location Rectum    Pain Orientation Mid    Pain Descriptors / Indicators Burning    Pain Type Chronic pain    Pain Onset More than a month ago    Pain Frequency Constant    Aggravating Factors  while the mucous comes out of the rectum    Pain Relieving Factors uses an acid medicine    Multiple Pain Sites No                             [facility] Adult PT Treatment/Exercise - 12/17/20 0001      Self-Care   Self-Care Other Self-Care Comments    Other Self-Care Comments  discussed with patient on rectal care  Lumbar Exercises: Stretches   Active Hamstring Stretch Right;Left;1 rep;30 seconds    Active Hamstring Stretch Limitations sitting    Hip Flexor Stretch Right;Left;1 rep;30 seconds    Hip Flexor Stretch Limitations sitting    Piriformis Stretch Right;Left;1 rep;30 seconds    Piriformis Stretch Limitations sitting    Other Lumbar Stretch Exercise hip adductor stretch in sitting right and left holding for 30 seconds      Manual Therapy   Manual Therapy Soft tissue mobilization;Myofascial release    Soft tissue mobilization to scar to improve mobility; circular massage to assist in peristalic motion of the intestines    Myofascial Release along the sac of douglas and lifting the scar up and release the tissue below                  PT Education - 12/17/20 1142    Education Details Access Code: [email or web address]     Person(s) Educated Patient    Methods Explanation;Demonstration;Verbal cues;Handout    Comprehension Returned demonstration;Verbalized understanding            PT Short Term Goals - 12/05/20 1722      PT SHORT TERM GOAL #1   Title Patient independent with initial summary    Time 4    Period Weeks    Status New    Target Date 01/02/21      PT SHORT TERM GOAL #2   Title able to brace his abdominal correctly with core work    Time 4    Period Weeks    Status New    Target Date 01/02/21      PT SHORT TERM GOAL #3   Title understand ways to care for the rectum to reduce the burning    Time 4    Period Weeks    Status New    Target Date 01/02/21             PT Long Term Goals - 12/05/20 1724      PT LONG TERM GOAL #1   Title independent with advanced HEP    Time 12    Period Weeks    Status New    Target Date 02/27/21      PT LONG TERM GOAL #2   Title able to contract the pelvic floor with a lift to increase tone of the pelvic floor and reduce the gapping of the anus    Time 12    Period Weeks    Status New    Target Date 02/27/21      PT LONG TERM GOAL #3   Title fecal leakage decreased >/= 50% due to increased tone and strength of the anus    Time 12    Period Weeks    Status New    Target Date 02/27/21      PT LONG TERM GOAL #4   Title sensitivity of the anus increased so patient is able to feel the stool come out of the anus 50% of the time    Time 12    Period Weeks    Status New    Target Date 02/27/21      PT LONG TERM GOAL #5   Title ------                 Plan - 12/17/20 1145    Clinical Impression Statement Patient continues to have alot of mucous coming out of the rectum. Patient has learned stretches he can use at work  for mobiity. Patient has learned abdominal massage to improve peristalic motion of the intestines. Patient has fascial tightness along the abdomen. Patient just started so he has not met goals at this time. Patient  will benefit from skilled therpay to reduce the fecal leakge, increases rextal tone, and improve core strength.    Personal Factors and Comorbidities Comorbidity 2;Transportation    Comorbidities rectal cancer with chemotherapy and radiation; s/p anterior colon resection; s/p loop ilieostomy takedown    Examination-Activity Limitations Locomotion Level;Continence;Toileting;Squat    Examination-Participation Restrictions Interpersonal Relationship;Community Activity    Stability/Clinical Decision Making Evolving/Moderate complexity    Rehab Potential Excellent    PT Frequency 1x / week    PT Treatment/Interventions Biofeedback;Electrical Stimulation;Neuromuscular re-education;Therapeutic exercise;Therapeutic activities;Patient/family education;Manual techniques;Scar mobilization;Spinal Manipulations    PT Next Visit Plan continue with manual work to abdomen; manual work to the rectum; abdominal bracing, toileting    PT Home Exercise Plan Access Code: [facility]    Recommended Other Services MD signed initial    Consulted and Agree with Plan of Care Patient           Patient will benefit from skilled therapeutic intervention in order to improve the following deficits and impairments:  Decreased coordination,Increased fascial restricitons,Decreased endurance,Increased muscle spasms,Pain,Decreased activity tolerance,Decreased mobility,Decreased strength,Decreased scar mobility  Visit Diagnosis: Muscle weakness (generalized)  Other lack of coordination     Problem List Patient Active Problem List   Diagnosis Date Noted  . Essential hypertension 09/18/2016  . Hyperlipidemia 09/18/2016  . Diabetes mellitus, type 2 ([place]) 10/11/2015    Earlie Counts, PT 12/17/20 11:48 AM   [facility] [address] [place], [place], [address] Phone: [phone]   Fax:  [phone]  Name: Phillip Tate MRN: [id number] Date of  Birth: 1956/09/16  PHYSICAL THERAPY DISCHARGE SUMMARY  Visits from Start of Care: 2  Current functional level related to goals / functional outcomes: See above. Patient wife called to cancel all of his appointments and wanted to be discharged.    Remaining deficits: Unable to assess due to patient not returning since last visit.    Education / Equipment: HEP Plan: Patient agrees to discharge.  Patient goals were not met. Patient is being discharged due to the patient's request. Thank you for the referral. Earlie Counts, PT 01/23/21 2:14 PM   ?????

## 2021-01-03 ENCOUNTER — Encounter: Payer: Medicare HMO | Admitting: Physical Therapy

## 2021-01-10 ENCOUNTER — Encounter: Payer: Medicare HMO | Admitting: Physical Therapy

## 2021-01-17 ENCOUNTER — Encounter: Payer: Medicare HMO | Admitting: Physical Therapy

## 2021-01-24 ENCOUNTER — Encounter: Payer: Medicare HMO | Admitting: Physical Therapy

## 2021-01-31 ENCOUNTER — Encounter: Payer: Medicare HMO | Admitting: Physical Therapy

## 2021-02-04 ENCOUNTER — Emergency Department (HOSPITAL_COMMUNITY)
Admission: EM | Admit: 2021-02-04 | Discharge: 2021-02-05 | Disposition: A | Discharge status: Home / Self Care | Payer: Medicare HMO | Attending: Emergency Medicine | Admitting: Emergency Medicine

## 2021-02-04 ENCOUNTER — Emergency Department (HOSPITAL_COMMUNITY): Payer: Medicare HMO

## 2021-02-04 ENCOUNTER — Encounter (HOSPITAL_COMMUNITY): Payer: Self-pay | Admitting: *Deleted

## 2021-02-04 DIAGNOSIS — I200 Unstable angina: Secondary | ICD-10-CM | POA: Diagnosis not present

## 2021-02-04 DIAGNOSIS — Z5321 Procedure and treatment not carried out due to patient leaving prior to being seen by health care provider: Secondary | ICD-10-CM | POA: Insufficient documentation

## 2021-02-04 DIAGNOSIS — R0602 Shortness of breath: Secondary | ICD-10-CM | POA: Diagnosis not present

## 2021-02-04 DIAGNOSIS — R0789 Other chest pain: Secondary | ICD-10-CM | POA: Diagnosis not present

## 2021-02-04 DIAGNOSIS — R110 Nausea: Secondary | ICD-10-CM | POA: Insufficient documentation

## 2021-02-04 DIAGNOSIS — M549 Dorsalgia, unspecified: Secondary | ICD-10-CM | POA: Insufficient documentation

## 2021-02-04 DIAGNOSIS — R079 Chest pain, unspecified: Secondary | ICD-10-CM | POA: Diagnosis not present

## 2021-02-04 DIAGNOSIS — I201 Angina pectoris with documented spasm: Secondary | ICD-10-CM | POA: Diagnosis not present

## 2021-02-04 DIAGNOSIS — R109 Unspecified abdominal pain: Secondary | ICD-10-CM | POA: Insufficient documentation

## 2021-02-04 DIAGNOSIS — R100 Acute abdomen: Secondary | ICD-10-CM | POA: Diagnosis not present

## 2021-02-04 DIAGNOSIS — R1084 Generalized abdominal pain: Secondary | ICD-10-CM | POA: Diagnosis not present

## 2021-02-04 LAB — CBC
HCT: 50.8 % (ref 39.0–52.0)
Hemoglobin: 16.6 g/dL (ref 13.0–17.0)
MCH: 25.8 pg — ABNORMAL LOW (ref 26.0–34.0)
MCHC: 32.7 g/dL (ref 30.0–36.0)
MCV: 79 fL — ABNORMAL LOW (ref 80.0–100.0)
Platelets: 260 10*3/uL (ref 150–400)
RBC: 6.43 MIL/uL — ABNORMAL HIGH (ref 4.22–5.81)
RDW: 16.6 % — ABNORMAL HIGH (ref 11.5–15.5)
WBC: 9.4 10*3/uL (ref 4.0–10.5)
nRBC: 0 % (ref 0.0–0.2)

## 2021-02-04 NOTE — ED Triage Notes (Addendum)
Pt arrives via [facility] from home. Abdominal pain for 18 hours, pain from the lower abdomen and radiates through the chest. 10/10 pain. Constant, no changes in position. Has taken multiple OTC meds. Intermittent nausea. Some SOB. Denies radiation. Did not take his bp meds today 180's SBP, hr 70's, sats on RA 98%. HX of angina, does not take nitro. Took 1 nitro, with improved pain just PTA. CBG 162. Also gave 4 baby asa and 4 zofran en route.

## 2021-02-04 NOTE — ED Triage Notes (Signed)
Pain in the mid abdominal area, goes up through the chest area, radiates to the back. Started around 3 am this morning, reports taking some OTC antacids and ibuprofen without relief. Has had nausea, no diarrhea. Tender in the abdomen.

## 2021-02-05 DIAGNOSIS — R109 Unspecified abdominal pain: Secondary | ICD-10-CM | POA: Diagnosis not present

## 2021-02-05 DIAGNOSIS — R1310 Dysphagia, unspecified: Secondary | ICD-10-CM | POA: Diagnosis not present

## 2021-02-05 LAB — BASIC METABOLIC PANEL
Anion gap: 14 (ref 5–15)
BUN: 14 mg/dL (ref 8–23)
CO2: 19 mmol/L — ABNORMAL LOW (ref 22–32)
Calcium: 9.5 mg/dL (ref 8.9–10.3)
Chloride: 101 mmol/L (ref 98–111)
Creatinine, Ser: 0.71 mg/dL (ref 0.61–1.24)
GFR, Estimated: >60 mL/min (ref >60)
Glucose, Bld: 186 mg/dL — ABNORMAL HIGH (ref 70–99)
Potassium: 4.6 mmol/L (ref 3.5–5.1)
Sodium: 134 mmol/L — ABNORMAL LOW (ref 135–145)

## 2021-02-05 LAB — TROPONIN I (HIGH SENSITIVITY)
Troponin I (High Sensitivity): 4 ng/L (ref <18)
Troponin I (High Sensitivity): 7 ng/L (ref <18)

## 2021-02-05 LAB — LIPASE, BLOOD: Lipase: 26 U/L (ref 11–51)

## 2021-02-05 NOTE — ED Notes (Signed)
Pt wife to sort desk inquiring about pt lab results, discussed with her that a provider reviews results and discusses those with the patient when in treatment room. Pt wife asking to "sign him out" discussed with her that it is his risk to leave without being seen by provider, encouraged to stay. Pt has decided he wants to leave. IV removed

## 2021-02-07 ENCOUNTER — Encounter: Payer: Medicare HMO | Admitting: Physical Therapy

## 2021-02-12 IMAGING — DX DG CHEST 2V
2 series · 2 of 2 positions shown · non-contrast
Comparison: Chest radiograph February 05, 2018

CLINICAL DATA: Chest pain

EXAM:
CHEST - 2 VIEW

[chest pa]
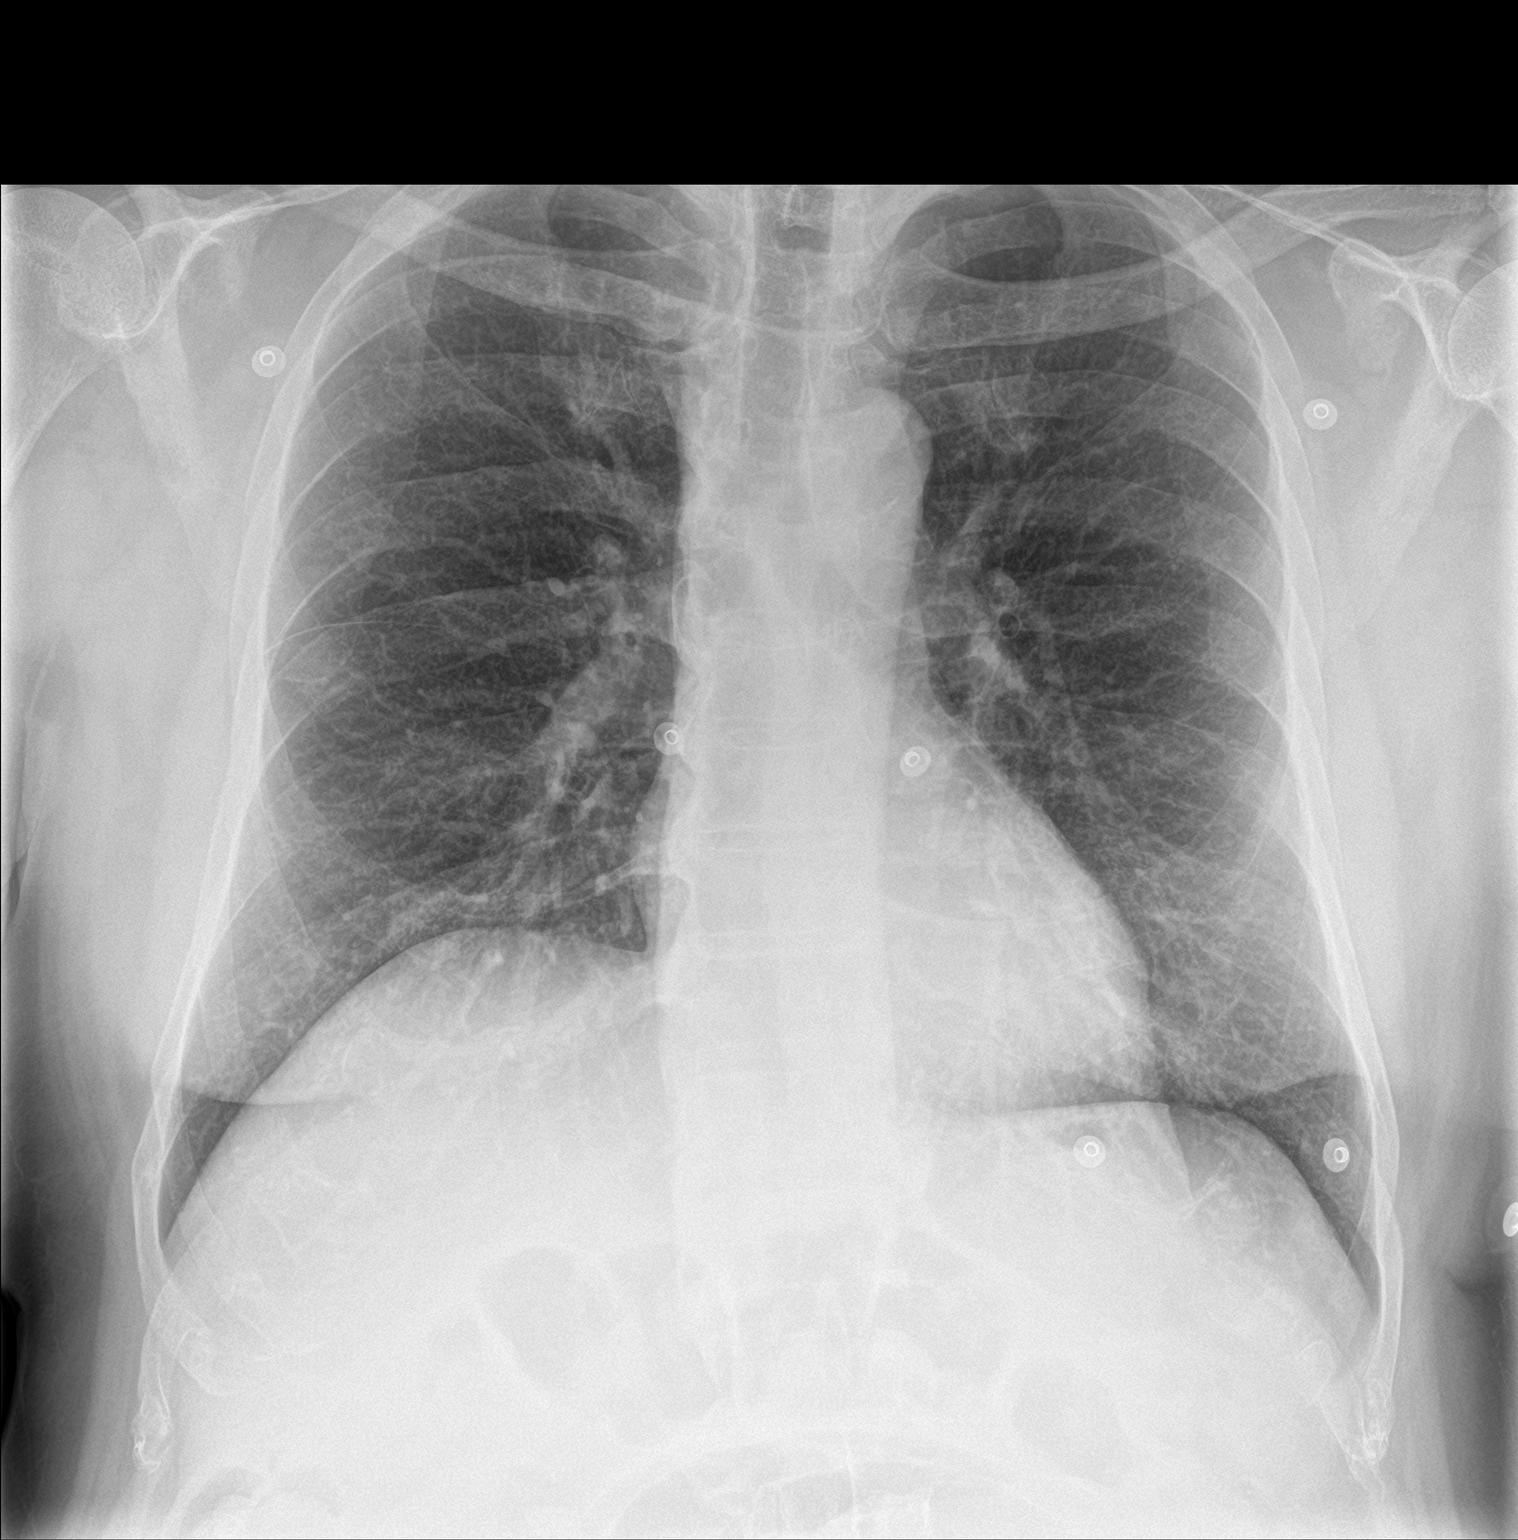

[chest lat]
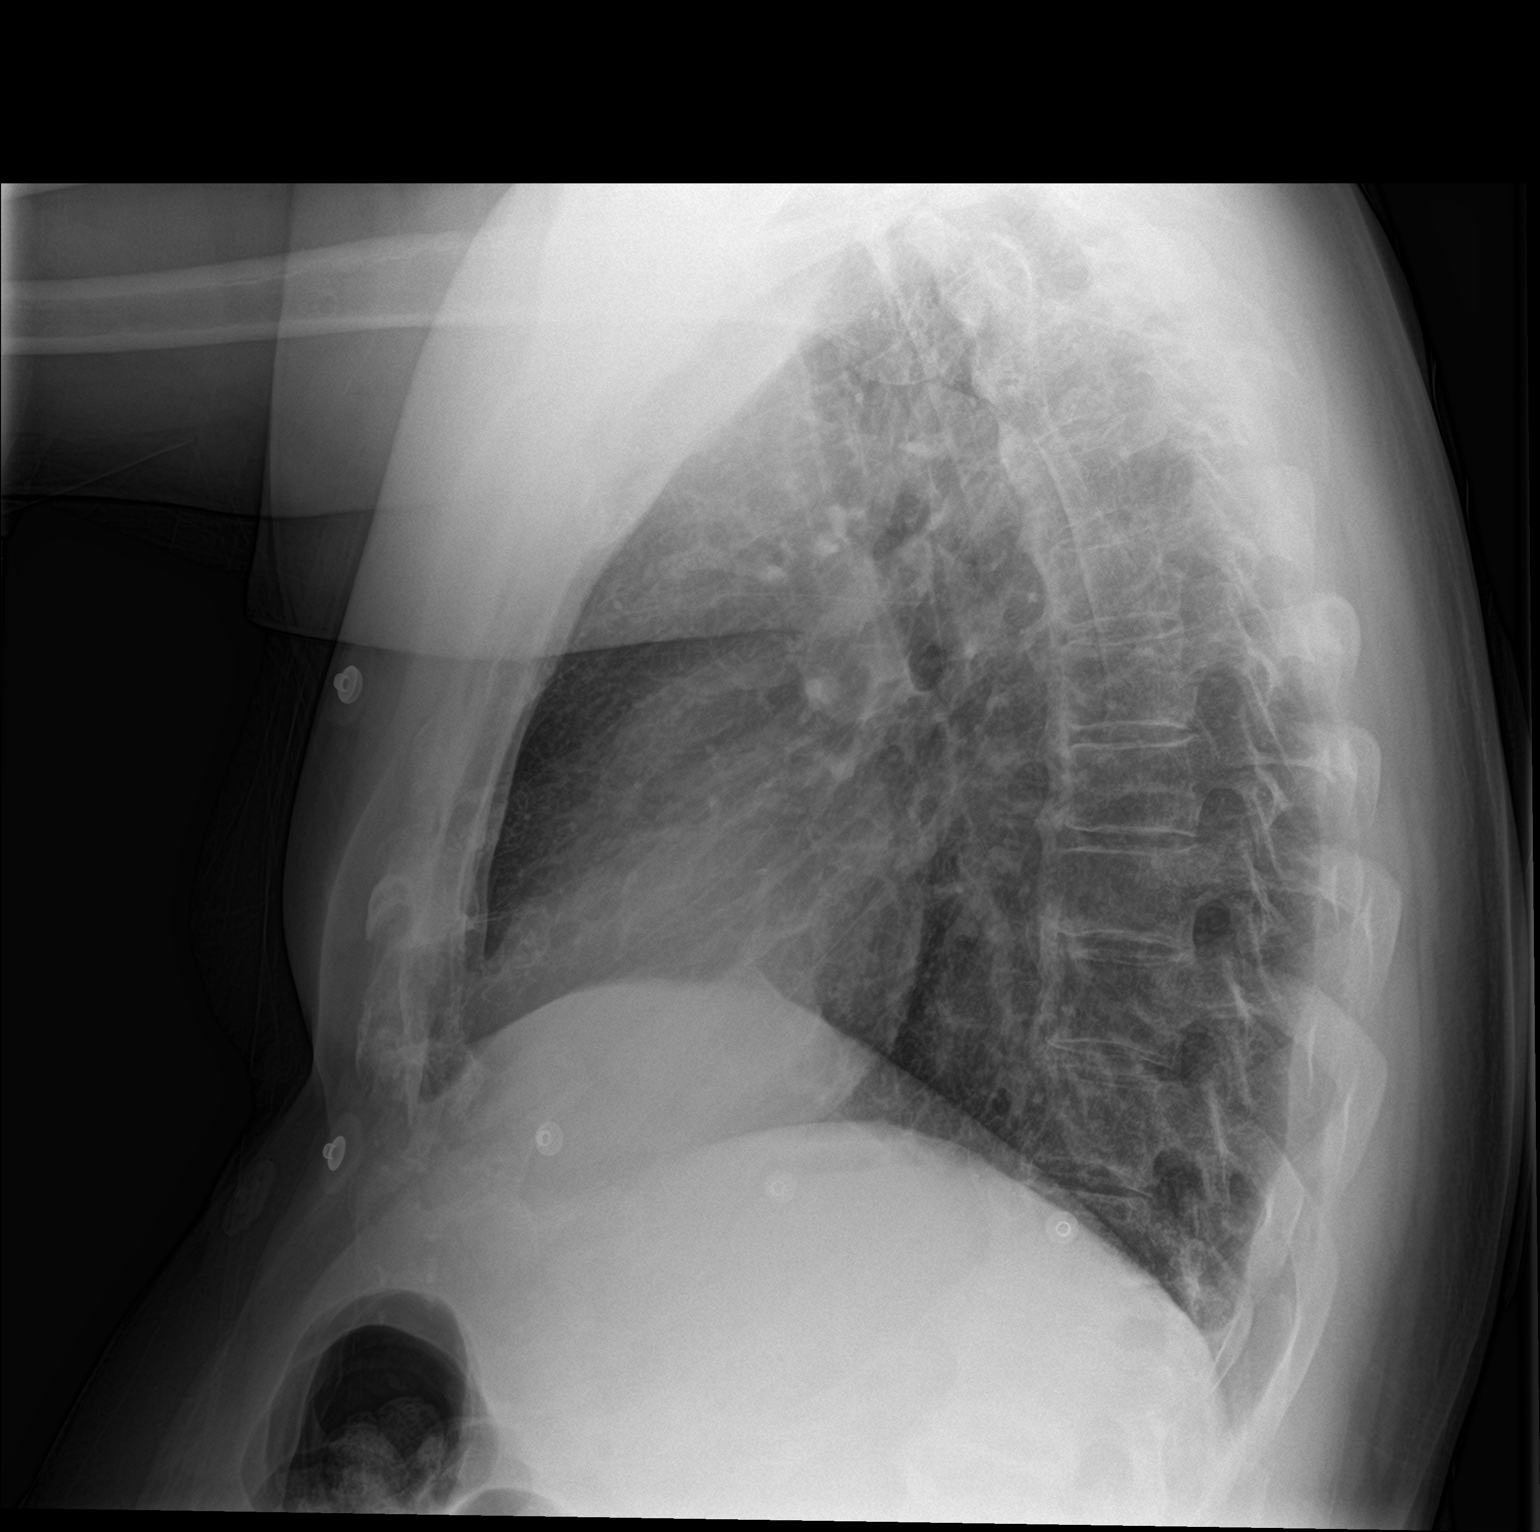

[2 of 2 positions shown; findings below may reference images not displayed]

FINDINGS: The heart size and mediastinal contours are within normal limits. No
focal consolidation. No pleural effusion. No pneumothorax. The
visualized skeletal structures are unremarkable.
IMPRESSION: No active cardiopulmonary disease.

## 2021-02-21 ENCOUNTER — Encounter: Payer: Medicare HMO | Admitting: Physical Therapy

## 2021-03-05 ENCOUNTER — Encounter: Payer: Self-pay | Admitting: Gastroenterology

## 2021-03-05 ENCOUNTER — Other Ambulatory Visit (INDEPENDENT_AMBULATORY_CARE_PROVIDER_SITE_OTHER): Payer: Medicare HMO

## 2021-03-05 ENCOUNTER — Other Ambulatory Visit: Payer: Self-pay

## 2021-03-05 ENCOUNTER — Ambulatory Visit: Payer: Medicare HMO | Admitting: Gastroenterology

## 2021-03-05 VITALS — BP 130/80 | HR 87 | Ht 70.0 in | Wt 216.0 lb

## 2021-03-05 DIAGNOSIS — Z85048 Personal history of other malignant neoplasm of rectum, rectosigmoid junction, and anus: Secondary | ICD-10-CM | POA: Insufficient documentation

## 2021-03-05 DIAGNOSIS — R1013 Epigastric pain: Secondary | ICD-10-CM

## 2021-03-05 DIAGNOSIS — Z8639 Personal history of other endocrine, nutritional and metabolic disease: Secondary | ICD-10-CM | POA: Insufficient documentation

## 2021-03-05 DIAGNOSIS — M5090 Cervical disc disorder, unspecified, unspecified cervical region: Secondary | ICD-10-CM | POA: Insufficient documentation

## 2021-03-05 DIAGNOSIS — E291 Testicular hypofunction: Secondary | ICD-10-CM | POA: Insufficient documentation

## 2021-03-05 DIAGNOSIS — L439 Lichen planus, unspecified: Secondary | ICD-10-CM | POA: Insufficient documentation

## 2021-03-05 DIAGNOSIS — E1165 Type 2 diabetes mellitus with hyperglycemia: Secondary | ICD-10-CM | POA: Insufficient documentation

## 2021-03-05 DIAGNOSIS — R198 Other specified symptoms and signs involving the digestive system and abdomen: Secondary | ICD-10-CM | POA: Insufficient documentation

## 2021-03-05 DIAGNOSIS — R110 Nausea: Secondary | ICD-10-CM

## 2021-03-05 DIAGNOSIS — E1121 Type 2 diabetes mellitus with diabetic nephropathy: Secondary | ICD-10-CM | POA: Insufficient documentation

## 2021-03-05 DIAGNOSIS — D509 Iron deficiency anemia, unspecified: Secondary | ICD-10-CM

## 2021-03-05 DIAGNOSIS — N181 Chronic kidney disease, stage 1: Secondary | ICD-10-CM | POA: Insufficient documentation

## 2021-03-05 DIAGNOSIS — K219 Gastro-esophageal reflux disease without esophagitis: Secondary | ICD-10-CM | POA: Insufficient documentation

## 2021-03-05 DIAGNOSIS — C20 Malignant neoplasm of rectum: Secondary | ICD-10-CM | POA: Insufficient documentation

## 2021-03-05 DIAGNOSIS — K6289 Other specified diseases of anus and rectum: Secondary | ICD-10-CM | POA: Insufficient documentation

## 2021-03-05 DIAGNOSIS — E1142 Type 2 diabetes mellitus with diabetic polyneuropathy: Secondary | ICD-10-CM | POA: Insufficient documentation

## 2021-03-05 DIAGNOSIS — E7800 Pure hypercholesterolemia, unspecified: Secondary | ICD-10-CM | POA: Insufficient documentation

## 2021-03-05 DIAGNOSIS — Z794 Long term (current) use of insulin: Secondary | ICD-10-CM | POA: Insufficient documentation

## 2021-03-05 DIAGNOSIS — E11319 Type 2 diabetes mellitus with unspecified diabetic retinopathy without macular edema: Secondary | ICD-10-CM | POA: Insufficient documentation

## 2021-03-05 DIAGNOSIS — R159 Full incontinence of feces: Secondary | ICD-10-CM | POA: Insufficient documentation

## 2021-03-05 DIAGNOSIS — K5900 Constipation, unspecified: Secondary | ICD-10-CM | POA: Insufficient documentation

## 2021-03-05 LAB — HEPATIC FUNCTION PANEL
ALT: 19 U/L (ref 0–53)
AST: 17 U/L (ref 0–37)
Albumin: 4.1 g/dL (ref 3.5–5.2)
Alkaline Phosphatase: 76 U/L (ref 39–117)
Bilirubin, Direct: 0.1 mg/dL (ref 0.0–0.3)
Total Bilirubin: 0.4 mg/dL (ref 0.2–1.2)
Total Protein: 7.5 g/dL (ref 6.0–8.3)

## 2021-03-05 LAB — IRON: Iron: 55 ug/dL (ref 42–165)

## 2021-03-05 MED ORDER — PANTOPRAZOLE SODIUM 40 MG PO TBEC
40.0000 mg | DELAYED_RELEASE_TABLET | Freq: Two times a day (BID) | ORAL | 2 refills | Status: DC
Start: 2021-03-05 — End: 2021-07-04

## 2021-03-05 MED ORDER — COLESTIPOL HCL 1 G PO TABS: 1.0000 g | ORAL_TABLET | Freq: Two times a day (BID) | ORAL | 2 refills | Status: DC

## 2021-03-05 NOTE — Patient Instructions (Addendum)
It was a pleasure to see you today. Based on our discussion, I am providing you with my recommendations below:  RECOMMENDATION(S):   I am recommending labs to evaluate your iron deficiency anemia  To better evaluate your abdominal pain, I am recommending an endoscopy  I would like to add Colestipol to your medication regimen and increase your Protonix as written below  PRESCRIPTION MEDICATION(S):   We have sent the following medication(s) to your pharmacy:  . Colestipol - please take 1g by mouth 2 times daily . Protonix: please take 40mg  by mouth 2 times daily  NOTE: If your medication(s) requires a PRIOR AUTHORIZATION, we will receive notification from your pharmacy. Once received, the process to submit for approval may take up to 7-10 business days. You will be contacted about any denials we have received from your insurance company as well as alternatives recommended by your provider.  LABS:   . Please proceed to the basement level for lab work before leaving today. Press "B" on the elevator. The lab is located at the first door on the left as you exit the elevator.  HEALTHCARE LAWS AND MY CHART RESULTS:   . Due to recent changes in healthcare laws, you may see results of your imaging and/or laboratory studies on MyChart before I have had a chance to review them.  I understand that in some cases there may be results that are confusing or concerning to you. Please understand that not all results are received at same time and often I may need to interpret multiple results in order to provide you with the best plan of care or course of treatment. Therefore, I ask that you please give me 48 hours to thoroughly review all your results before contacting my office for clarification.   ENDOSCOPY:   . You have been scheduled for an endoscopy. Please follow written instructions given to you at your visit today.  INHALERS:   . If you use inhalers (even only as needed), please bring them with  you on the day of your procedure.  MEDICATIONS TO HOLD:  . Please refer to your prep instructions regarding holding your diabetic medications.   BMI:  . If you are age 89 or older, your body mass index should be between 23-30. Your There is no height or weight on file to calculate BMI. If this is out of the aforementioned range listed, please consider follow up with your Primary Care Provider.  Thank you for trusting me with your gastrointestinal care!    Thornton Park, MD, MPH

## 2021-03-05 NOTE — Progress Notes (Signed)
Letter by Thornton Park, MD on 03/05/2021    [facility] [place], [place]  [address] Phone:  [phone]   Fax:  [phone]   MRN: [id number] [place] [id number]   Date: 03/05/2021  Dear Mr. Stayer,  It was a pleasure to see our mutual patient today. Could you please fax this patient's most recent office notes and labs for my review and continuity of care purposes.   Sincerely,  Thornton Park

## 2021-03-05 NOTE — Progress Notes (Signed)
Referring Provider: Wenda Low, MD Primary Care Physician:  Wenda Low, MD  Chief complaint:  Rectal pain and bleeding   IMPRESSION:  Epigastric abdominal pain Chronic rectal mucous with intermittent streaking     - previously attributed to internal hemorrhoids and prolapsed J pouch History of colon polyps     - tubular adenoma on colonoscopy 2016    - 2 tubular adenomas on colonoscopy 2021    - surveillance recommended in 5 years given the history of rectal cancer History of rectal adenocarcinoma s/p chemoradiation and J pouch 2006 Microcytosis with normal hemoglobin    - iron 59, % sat 13  Epigastric pain: Differential includes gastritis, esophagitis, H pylori, functional dyspepsia, hepatopancreatic etiologies. Check liver enzymes today. Add PPI. EGD with biopsies for further evaluation. Plan CT scan if EGD is negative.   Chronic rectal mucous with bloody streaking rfollowing surgery for rectal adenocarcinoma: No evidence for  recurrent hemorrhoids, pouchitis, radiation proctitis, polyps, recurrent prolapse. Continue supportive care.    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  Continue with stool bulking agent. No medication changes today, although he request colestipol instead of cholestyramine.      PLAN: Trial of Colestipol 1g BID Protonix 40 mg BID Hepatic function panel, iron, ferritin EGD CT abd/pelvis with contrast if EGD is negative First degree family members should start colon cancer screening at age 44   HPI: Benedict BENJAMIM HARNISH is a 65 y.o. male originally seen 11/27/20 for rectal pain and bleeding.   Followed by Dr. Hester Mates at [facility]. He was diagnosed with stage IIIA rectal adenocarcinoma 2006 s/p neoadjuvant chemoradio therapy, s/p intersphincteric proctectomy with colonic J-pouch  and inverting loop ileostomy 10/2005, s/p loop ileostomy takedown 05/2006, hemorrhoidectomy 10/2018, and repair of prolapsed J pouch 11/2018. Has been seen by Dr. Hester Mates for mucous stools with blood streaking and leakage. His notes: "I think this is a functional issue with his J pouch. I have nothing to offer from a surgical standpoint."  At the time of his initial consultation 11/27/20 he reported fecal soiling that occurs with walking. Wears adult pull-ups. Started cholestyramine powder 2 weeks ago (insurance did not cover colestipol), as well as dicyclomine 20 mg PRN, pantroprazole 20 mg, lactulose.  Did not find the cholestryamine helpful.  Colonoscopy 11/28/20 showed decreased sphincter tone, anastomosis, and 4 small polyps, two of which were tubular adenomas and two were lymphoid aggregates.   Pelvic PT with Earlie Counts x 2 did not help her. He continues to have ongoing mucous.  Purgative PRN helps with the mucous.   One month ago he developed epigastric abdominal pain with some migration.  Associated nausea, dry heaves. Treated with ginger ale. Historically improves after a BM. However, the last episode did not improve until he took ibuprofen 600 mg. Now with 11/10 pain that was disrupting his sleep.   Seen in the ED 02/04/21: normal CBC and lipase. CXR normal.  He was seen prior to being evaluated.   Review of CareEverywhere notes:  Prior endoscopy in 2011 with Dr. Earle Gell with normal small bowel biopsies. Tubular adenoma with Dr. Colonoscopy 2016.  Dr. Clinton Gallant with colonoscopy 2019 with a 6mm sigmoid polyp removed from the sigmoid. Colonic mucosa appeared normal.  Surveillance recommended in 5 years.      Past Medical History:  Diagnosis Date  . Coronary artery disease   . DDD (degenerative  disc disease), lumbar   . Diabetes mellitus without complication ([place])   . Dyslipidemia   . History of rectal cancer    dx 2006 --  Stage III, T3  N1  M0,  s/p  chemoradiation (07-10-2005  to 09-05-2005) and anterior colon resection 11-04-2006  . History of shingles   . Hyperlipidemia   . Hypertension   . Iron deficiency anemia    intolerent PO iron--  gets IV iron,  last one 03-12-2015 ([place])  . Organic impotence   . Rectal cancer ([place])    adenocarcinoma stage lll    Past Surgical History:  Procedure Laterality Date  . ANTERIOR COLON RESECTION W/ COLOSTOMY  11-04-2006  . CATARACT EXTRACTION W/ INTRAOCULAR LENS IMPLANT Right sept 2015  &  feb 2016  . COLONOSCOPY WITH ESOPHAGOGASTRODUODENOSCOPY (EGD)  last one 03-07-2010  . COLONOSCOPY WITH PROPOFOL N/A 10/23/2015   Procedure: COLONOSCOPY WITH PROPOFOL;  Surgeon: Garlan Fair, MD;  Location: [facility];  Service: Endoscopy;  Laterality: N/A;  . COLOSTOMY TAKEDOWN  june 2007  . EXAMINATION UNDER ANESTHESIA N/A 04/23/2015   Procedure: EXAM UNDER ANESTHESIA, MANIPULATION OF PENILE PROSTHESIS PUMP;  Surgeon: Kathie Rhodes, MD;  Location: [facility];  Service: Urology;  Laterality: N/A;  . PENILE PROSTHESIS PLACEMENT  10-29-2009  . PORT A CATH PLACEMENT  07-23-2005  . prolapsed hemorrhoid      Current Outpatient Medications  Medication Sig Dispense Refill  . AMBULATORY NON FORMULARY MEDICATION Medication Name: Nitroglycerin 0.125% gel. Apply pea size amount to the rectum three times a day for 6-8 weeks 30 g 0  . amLODipine (NORVASC) 10 MG tablet Take 10 mg by mouth every morning.     . dicyclomine (BENTYL) 20 MG tablet Take 20 mg by mouth every 6 (six) hours. As needed    . empagliflozin (JARDIANCE) 25 MG TABS tablet Take 25 mg by mouth daily.    Marland Kitchen gabapentin (NEURONTIN) 300 MG capsule Take 300 mg by mouth 3 (three) times daily.   0  . ibuprofen (ADVIL,MOTRIN) 200 MG tablet Take 200 mg by mouth every 6 (six) hours as needed for fever or moderate pain.    Marland Kitchen insulin glargine (LANTUS) 100 UNIT/ML injection Inject 40-42 Units into the skin at bedtime.     . insulin lispro (HUMALOG) 100 UNIT/ML injection  Inject 16-40 Units into the skin 3 (three) times daily before meals. Sliding scan  16units am, 25-30units lunch. 35-40units supper    . INVOKANA 300 MG TABS tablet Take 300 mg by mouth daily before breakfast.  2  . labetalol (NORMODYNE) 300 MG tablet Take 300 mg by mouth 2 (two) times daily.    Marland Kitchen lactulose (CEPHULAC) 10 g packet Take 10 g by mouth every other day.    . lisinopril (PRINIVIL,ZESTRIL) 40 MG tablet Take 40 mg by mouth every evening.     . metFORMIN (GLUCOPHAGE) 1000 MG tablet Take 1,000 mg by mouth 2 (two) times daily with a meal.    . Multiple Vitamin (MULTIVITAMIN) tablet Take 1 tablet by mouth daily.    Marland Kitchen NOVOFINE 30G X 8 MM MISC Inject 1 packet into the skin as needed. BLOOD SUGARS  2  . NOVOLOG FLEXPEN 100 UNIT/ML FlexPen Inject 90 Units into the skin 3 (three) times daily with meals.   2  . pantoprazole (PROTONIX) 20 MG tablet Take 20 mg by mouth daily.    . polyvinyl alcohol (LIQUIFILM TEARS) 1.4 % ophthalmic solution Place 1 drop into both eyes  as needed for dry eyes.    . rosuvastatin (CRESTOR) 10 MG tablet Take 1 tablet (10 mg total) by mouth daily. 90 tablet 3  . VALACYCLOVIR HCL PO Take 1 mg by mouth daily.    Marland Kitchen zolpidem (AMBIEN) 10 MG tablet Take 10 mg by mouth at bedtime.    . fish oil-omega-3 fatty acids 1000 MG capsule Take 1,500 mg by mouth daily. (Patient not taking: Reported on 03/05/2021)     No current facility-administered medications for this visit.    Allergies as of 03/05/2021 - Review Complete 03/05/2021  Allergen Reaction Noted  . Cardizem cd [diltiazem hcl er beads] Swelling 07/04/2013  . Toprol xl [metoprolol] Other (See Comments) 07/04/2013  . Tricor [fenofibrate] Other (See Comments) 07/04/2013     Physical Exam: General:   Alert,  well-nourished, pleasant and cooperative in NAD Head:  Normocephalic and atraumatic. Eyes:  Sclera clear, no icterus.   Conjunctiva pink. Ears:  Normal auditory acuity. Nose:  No deformity, discharge,  or  lesions. Mouth:  No deformity or lesions.   Neck:  Supple; no masses or thyromegaly. Lungs:  Clear throughout to auscultation.   No wheezes. Heart:  Regular rate and rhythm; no murmurs. Abdomen:  Soft, nontender, nondistended, normal bowel sounds, no rebound or guarding. No hepatosplenomegaly.   Neurologic:  Alert and  oriented x4;  grossly nonfocal Skin:  Intact without significant lesions or rashes. Psych:  Alert and cooperative. Normal mood and affect.     Terrye Dombrosky L. Tarri Glenn, MD, MPH 03/05/2021, 4:01 PM

## 2021-03-06 LAB — FERRITIN: Ferritin: 42.2 ng/mL (ref 22.0–322.0)

## 2021-03-12 ENCOUNTER — Other Ambulatory Visit: Payer: Self-pay

## 2021-03-12 DIAGNOSIS — R110 Nausea: Secondary | ICD-10-CM

## 2021-03-12 DIAGNOSIS — R1013 Epigastric pain: Secondary | ICD-10-CM

## 2021-03-12 MED ORDER — COLESTIPOL HCL 1 G PO TABS: 1.0000 g | ORAL_TABLET | Freq: Two times a day (BID) | ORAL | 3 refills | Status: DC

## 2021-03-15 ENCOUNTER — Encounter: Payer: Self-pay | Admitting: Gastroenterology

## 2021-04-03 ENCOUNTER — Other Ambulatory Visit: Payer: Self-pay

## 2021-04-03 ENCOUNTER — Telehealth: Payer: Self-pay | Admitting: Gastroenterology

## 2021-04-03 MED ORDER — CIPROFLOXACIN HCL 500 MG PO TABS
500.0000 mg | ORAL_TABLET | Freq: Two times a day (BID) | ORAL | 0 refills | Status: DC
Start: 2021-04-03 — End: 2021-05-23

## 2021-04-03 NOTE — Telephone Encounter (Signed)
He should avoid all NSAIDs. A daily probiotic might be helpful. Recommend 14 days of Cipro 500 mg BID. He needs office follow-up with me or an APP. Thanks.

## 2021-04-03 NOTE — Telephone Encounter (Signed)
Pt called and states that last night he was passing a lot of mucous and blood from his rectal area. Reports he has been taking colestipol 1gm BID but states that has not helped. He states last night he did a "full cleanse" and the mucous seems to be a little less. He wants to know if Dr. Tarri Glenn has any other recommendations. Please advise.

## 2021-04-03 NOTE — Telephone Encounter (Signed)
Patient called states he is having a lot of mucus and blood coming from his rectum and is urgently seeking advise.

## 2021-04-03 NOTE — Telephone Encounter (Signed)
Spoke with pt and he is aware. Script sent to pharmacy. 

## 2021-04-15 DIAGNOSIS — K6289 Other specified diseases of anus and rectum: Secondary | ICD-10-CM | POA: Diagnosis not present

## 2021-04-15 DIAGNOSIS — E1142 Type 2 diabetes mellitus with diabetic polyneuropathy: Secondary | ICD-10-CM | POA: Diagnosis not present

## 2021-04-15 DIAGNOSIS — Z125 Encounter for screening for malignant neoplasm of prostate: Secondary | ICD-10-CM | POA: Diagnosis not present

## 2021-04-15 DIAGNOSIS — R1310 Dysphagia, unspecified: Secondary | ICD-10-CM | POA: Diagnosis not present

## 2021-04-15 DIAGNOSIS — Z794 Long term (current) use of insulin: Secondary | ICD-10-CM | POA: Diagnosis not present

## 2021-04-15 DIAGNOSIS — E782 Mixed hyperlipidemia: Secondary | ICD-10-CM | POA: Diagnosis not present

## 2021-04-15 DIAGNOSIS — Z0000 Encounter for general adult medical examination without abnormal findings: Secondary | ICD-10-CM | POA: Diagnosis not present

## 2021-04-15 DIAGNOSIS — I10 Essential (primary) hypertension: Secondary | ICD-10-CM | POA: Diagnosis not present

## 2021-04-15 DIAGNOSIS — D509 Iron deficiency anemia, unspecified: Secondary | ICD-10-CM | POA: Diagnosis not present

## 2021-04-15 DIAGNOSIS — G629 Polyneuropathy, unspecified: Secondary | ICD-10-CM | POA: Diagnosis not present

## 2021-04-15 DIAGNOSIS — R159 Full incontinence of feces: Secondary | ICD-10-CM | POA: Diagnosis not present

## 2021-04-15 DIAGNOSIS — Z85048 Personal history of other malignant neoplasm of rectum, rectosigmoid junction, and anus: Secondary | ICD-10-CM | POA: Diagnosis not present

## 2021-04-18 ENCOUNTER — Other Ambulatory Visit: Payer: Self-pay

## 2021-04-18 ENCOUNTER — Ambulatory Visit (AMBULATORY_SURGERY_CENTER): Payer: Medicare HMO | Admitting: Gastroenterology

## 2021-04-18 ENCOUNTER — Encounter: Payer: Self-pay | Admitting: Gastroenterology

## 2021-04-18 VITALS — BP 125/73 | HR 64 | Temp 98.6°F | Resp 15 | Ht 70.0 in | Wt 216.0 lb

## 2021-04-18 DIAGNOSIS — K2970 Gastritis, unspecified, without bleeding: Secondary | ICD-10-CM

## 2021-04-18 DIAGNOSIS — R109 Unspecified abdominal pain: Secondary | ICD-10-CM | POA: Diagnosis not present

## 2021-04-18 DIAGNOSIS — K219 Gastro-esophageal reflux disease without esophagitis: Secondary | ICD-10-CM | POA: Diagnosis not present

## 2021-04-18 DIAGNOSIS — R1013 Epigastric pain: Secondary | ICD-10-CM

## 2021-04-18 DIAGNOSIS — K2950 Unspecified chronic gastritis without bleeding: Secondary | ICD-10-CM | POA: Diagnosis not present

## 2021-04-18 MED ORDER — SODIUM CHLORIDE 0.9 % IV SOLN: 500.0000 mL | Freq: Once | INTRAVENOUS | Status: DC

## 2021-04-18 NOTE — Progress Notes (Signed)
To PACU, VSS. Report to [email or web address] 

## 2021-04-18 NOTE — Progress Notes (Signed)
Medical history reviewed with no changes noted. VS assessed by C.W 

## 2021-04-18 NOTE — Patient Instructions (Signed)
Await pathology results.  Information on gastritis given to you.  Resume previous diet.  Continue present medications including Pantoprazole 40 mg twice a day.   Avoid NSAIDS (Aspirin, Ibuprofen, Aleve, Naproxen), you may use Tylenol as needed.  YOU HAD AN ENDOSCOPIC PROCEDURE TODAY AT [facility]:   Refer to the procedure report that was given to you for any specific questions about what was found during the examination.  If the procedure report does not answer your questions, please call your gastroenterologist to clarify.  If you requested that your care partner not be given the details of your procedure findings, then the procedure report has been included in a sealed envelope for you to review at your convenience later.  YOU SHOULD EXPECT: Some feelings of bloating in the abdomen. Passage of more gas than usual.  Walking can help get rid of the air that was put into your GI tract during the procedure and reduce the bloating. If you had a lower endoscopy (such as a colonoscopy or flexible sigmoidoscopy) you may notice spotting of blood in your stool or on the toilet paper. If you underwent a bowel prep for your procedure, you may not have a normal bowel movement for a few days.  Please Note:  You might notice some irritation and congestion in your nose or some drainage.  This is from the oxygen used during your procedure.  There is no need for concern and it should clear up in a day or so.  SYMPTOMS TO REPORT IMMEDIATELY:    Following upper endoscopy (EGD)  Vomiting of blood or coffee ground material  New chest pain or pain under the shoulder blades  Painful or persistently difficult swallowing  New shortness of breath  Fever of 100F or higher  Black, tarry-looking stools  For urgent or emergent issues, a gastroenterologist can be reached at any hour by calling [phone]. Do not use MyChart messaging for urgent concerns.    DIET:  We do recommend a small  meal at first, but then you may proceed to your regular diet.  Drink plenty of fluids but you should avoid alcoholic beverages for 24 hours.  ACTIVITY:  You should plan to take it easy for the rest of today and you should NOT DRIVE or use heavy machinery until tomorrow (because of the sedation medicines used during the test).    FOLLOW UP: Our staff will call the number listed on your records 48-72 hours following your procedure to check on you and address any questions or concerns that you may have regarding the information given to you following your procedure. If we do not reach you, we will leave a message.  We will attempt to reach you two times.  During this call, we will ask if you have developed any symptoms of COVID 19. If you develop any symptoms (ie: fever, flu-like symptoms, shortness of breath, cough etc.) before then, please call [phone].  If you test positive for Covid 19 in the 2 weeks post procedure, please call and report this information to [place].    If any biopsies were taken you will be contacted by phone or by letter within the next 1-3 weeks.  Please call us at [phone] if you have not heard about the biopsies in 3 weeks.    SIGNATURES/CONFIDENTIALITY: You and/or your care partner have signed paperwork which will be entered into your electronic medical record.  These signatures attest to the fact that that the information above  on your After Visit Summary has been reviewed and is understood.  Full responsibility of the confidentiality of this discharge information lies with you and/or your care-partner.

## 2021-04-18 NOTE — Op Note (Signed)
[place] Patient Name: Phillip Tate Procedure Date: 04/18/2021 10:18 AM MRN: [id number] Endoscopist: Thornton Park MD, MD Age: 65 Referring MD:  Date of Birth: [birth year] Gender: Male Account #: [id number] Procedure:                Upper GI endoscopy Indications:              Epigastric abdominal pain Medicines:                Monitored Anesthesia Care Procedure:                Pre-Anesthesia Assessment:                           - Prior to the procedure, a History and Physical                            was performed, and patient medications and                            allergies were reviewed. The patient's tolerance of                            previous anesthesia was also reviewed. The risks                            and benefits of the procedure and the sedation                            options and risks were discussed with the patient.                            All questions were answered, and informed consent                            was obtained. Prior Anticoagulants: The patient has                            taken no previous anticoagulant or antiplatelet                            agents. ASA Grade Assessment: II - A patient with                            mild systemic disease. After reviewing the risks                            and benefits, the patient was deemed in                            satisfactory condition to undergo the procedure.                           After obtaining informed consent, the endoscope was  passed under direct vision. Throughout the                            procedure, the patient's blood pressure, pulse, and                            oxygen saturations were monitored continuously. The                            Endoscope was introduced through the mouth, and                            advanced to the third part of duodenum. The upper                            GI endoscopy was  accomplished without difficulty.                            The patient tolerated the procedure well. Scope In: Scope Out: Findings:                 The Z-line was regular.                           The examined esophagus was normal.                           Diffuse minimal inflammation characterized by                            erythema, friability and granularity was found in                            the gastric body. Biopsies were taken from the                            antrum, body, and fundus with a cold forceps for                            histology. Estimated blood loss was minimal.                           Localized mildly erythematous mucosa without active                            bleeding and with no stigmata of bleeding was found                            in the distal duodenal bulb. Biopsies were taken                            with a cold forceps for histology. Estimated blood  loss was minimal.                           The cardia and gastric fundus were normal on                            retroflexion.                           The exam was otherwise without abnormality. Complications:            No immediate complications. Estimated blood loss:                            Minimal. Estimated Blood Loss:     Estimated blood loss was minimal. Impression:               - Z-line regular.                           - Normal esophagus.                           - Gastritis. Biopsied.                           - Erythematous duodenopathy. Biopsied.                           - The examination was otherwise normal. Recommendation:           - Patient has a contact number available for                            emergencies. The signs and symptoms of potential                            delayed complications were discussed with the                            patient. Return to normal activities tomorrow.                            Written  discharge instructions were provided to the                            patient.                           - Resume previous diet.                           - Continue present medications including                            pantoprazole 40 mg BID.                           - Await pathology results.                           -  No aspirin, ibuprofen, naproxen, or other                            non-steroidal anti-inflammatory drugs. Thornton Park MD, MD 04/18/2021 10:35:52 AM This report has been signed electronically.

## 2021-04-18 NOTE — Progress Notes (Signed)
Called to room to assist during endoscopic procedure.  Patient ID and intended procedure confirmed with present staff. Received instructions for my participation in the procedure from the performing physician.  

## 2021-04-22 ENCOUNTER — Telehealth: Payer: Self-pay | Admitting: *Deleted

## 2021-04-22 NOTE — Telephone Encounter (Signed)
1. Have you developed a fever since your procedure? no  2.   Have you had an respiratory symptoms (SOB or cough) since your procedure? no  3.   Have you tested positive for COVID 19 since your procedure no  4.   Have you had any family members/close contacts diagnosed with the COVID 19 since your procedure?  no   If yes to any of these questions please route to Joylene John, RN and Joella Prince, RN Follow up Call-  Call back number 04/18/2021 11/28/2020  Post procedure Call Back phone  # [phone]  Permission to leave phone message Yes Yes  Some recent data might be hidden     Patient questions:  Do you have a fever, pain , or abdominal swelling? No. Pain Score  0 *  Have you tolerated food without any problems? Yes.    Have you been able to return to your normal activities? Yes.    Do you have any questions about your discharge instructions: Diet   No. Medications  No. Follow up visit  No.  Do you have questions or concerns about your Care? No.  Actions: * If pain score is 4 or above: No action needed, pain <4.

## 2021-04-26 ENCOUNTER — Encounter: Payer: Self-pay | Admitting: Gastroenterology

## 2021-05-07 DIAGNOSIS — H0014 Chalazion left upper eyelid: Secondary | ICD-10-CM | POA: Diagnosis not present

## 2021-05-07 DIAGNOSIS — Z961 Presence of intraocular lens: Secondary | ICD-10-CM | POA: Diagnosis not present

## 2021-05-07 DIAGNOSIS — E113291 Type 2 diabetes mellitus with mild nonproliferative diabetic retinopathy without macular edema, right eye: Secondary | ICD-10-CM | POA: Diagnosis not present

## 2021-05-07 DIAGNOSIS — H04123 Dry eye syndrome of bilateral lacrimal glands: Secondary | ICD-10-CM | POA: Diagnosis not present

## 2021-05-07 DIAGNOSIS — H40013 Open angle with borderline findings, low risk, bilateral: Secondary | ICD-10-CM | POA: Diagnosis not present

## 2021-05-21 ENCOUNTER — Ambulatory Visit: Payer: Medicare HMO | Admitting: Gastroenterology

## 2021-05-23 ENCOUNTER — Encounter: Payer: Self-pay | Admitting: Gastroenterology

## 2021-05-23 ENCOUNTER — Other Ambulatory Visit: Payer: Self-pay

## 2021-05-23 ENCOUNTER — Ambulatory Visit: Payer: Medicare HMO | Admitting: Gastroenterology

## 2021-05-23 VITALS — BP 120/66 | HR 76 | Ht 68.5 in | Wt 217.5 lb

## 2021-05-23 DIAGNOSIS — R1013 Epigastric pain: Secondary | ICD-10-CM | POA: Diagnosis not present

## 2021-05-23 DIAGNOSIS — R195 Other fecal abnormalities: Secondary | ICD-10-CM

## 2021-05-23 DIAGNOSIS — H9312 Tinnitus, left ear: Secondary | ICD-10-CM | POA: Insufficient documentation

## 2021-05-23 DIAGNOSIS — R202 Paresthesia of skin: Secondary | ICD-10-CM | POA: Insufficient documentation

## 2021-05-23 DIAGNOSIS — J309 Allergic rhinitis, unspecified: Secondary | ICD-10-CM | POA: Insufficient documentation

## 2021-05-23 DIAGNOSIS — G629 Polyneuropathy, unspecified: Secondary | ICD-10-CM | POA: Insufficient documentation

## 2021-05-23 DIAGNOSIS — R1310 Dysphagia, unspecified: Secondary | ICD-10-CM | POA: Insufficient documentation

## 2021-05-23 DIAGNOSIS — K625 Hemorrhage of anus and rectum: Secondary | ICD-10-CM

## 2021-05-23 DIAGNOSIS — K91850 Pouchitis: Secondary | ICD-10-CM | POA: Diagnosis not present

## 2021-05-23 DIAGNOSIS — G479 Sleep disorder, unspecified: Secondary | ICD-10-CM | POA: Insufficient documentation

## 2021-05-23 MED ORDER — MESALAMINE 1.2 G PO TBEC: 2.4000 g | DELAYED_RELEASE_TABLET | Freq: Every day | ORAL | 0 refills | Status: DC

## 2021-05-23 MED ORDER — CIPROFLOXACIN HCL 500 MG PO TABS: 500.0000 mg | ORAL_TABLET | Freq: Two times a day (BID) | ORAL | 0 refills | Status: DC

## 2021-05-23 NOTE — Progress Notes (Signed)
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 Add Lialda. Avoid NSAIDs. Consider VSL3 and probiotics if symptoms not improving, as well as lowFODMAP diet. Encourage follow-up at [facility] in the IBD clinic when he follows up with his surgeons.    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  Continue with stool bulking agent. He has not tolerated cholestyramine or colestipol.  Epigastric pain: Now resolved. Plan CT scan if symptoms recur.  PLAN: - Avoid NSAIDs - Consider VSL3 probiotics - Cipro 500 mg BID x 14 days  - Add Lialda 2.4 mg  - Consider low FOMAP diet - Follow-up at [facility] for ongoing care, last seen 2021, consider follow-up in IBD Clinic for management of pouchitis   HPI: Phillip Tate is a 65 y.o. male originally seen 11/27/20 for rectal pain, bleeding, and mucous.   Followed by Dr. Hester Mates at [facility]. He was diagnosed with stage IIIA rectal adenocarcinoma 2006 s/p neoadjuvant chemoradio therapy, s/p intersphincteric proctectomy with colonic J-pouch and inverting loop ileostomy 10/2005, s/p loop ileostomy takedown 05/2006, hemorrhoidectomy 10/2018, and repair of prolapsed J pouch 11/2018. Has  been seen by Dr. Hester Mates for mucous stools with blood streaking and leakage. His notes: "I think this is a functional issue with his J pouch. I have nothing to offer from a surgical standpoint."  At the time of his initial consultation 11/27/20 he reported fecal soiling that occurs with walking. Wears adult pull-ups. Started cholestyramine powder 2 weeks ago (insurance did not cover colestipol), as well as dicyclomine 20 mg PRN, pantroprazole 20 mg, lactulose.  Did not find the cholestryamine helpful.  Colonoscopy 11/28/20 showed decreased sphincter tone, anastomosis, and 4 small polyps, two of which were tubular adenomas and two were lymphoid aggregates.   Pelvic PT with Earlie Counts x 2 did not help.   Developed epigastric pain in [place] with associated nausea, dry heaves. EGD 04/18/21 showed chronic gastritis without H pylori. Duodenal biopsies were normal.   He continues to have ongoing mucous and rectal bleeding. Purgative PRN helps with the mucous.  8 weeks of mucous and some leak.  Colestipol did not help Cipro for 2 weeks provided improvement in his symptoms in early April. But, symptoms recurred within one week.  Dicyclomine PRN for some relief of the liquid stools He is also using Imodium PRN   Endoscopic history:  - Prior endoscopy in 2011 with Dr. Earle Gell with normal small bowel biopsies. Tubular adenoma with Dr. Colonoscopy 2016.  - Dr. Clinton Gallant with colonoscopy 2019 with a 40mm sigmoid polyp removed from the sigmoid. Colonic mucosa appeared normal.  Surveillance recommended in 5 years.  - Colonoscopy 11/28/20 showed decreased sphincter tone, anastomosis, and 4 small polyps, two of which were tubular adenomas and two  were lymphoid aggregates.      Past Medical History:  Diagnosis Date  . Coronary artery disease   . DDD (degenerative disc disease), lumbar   . Diabetes mellitus without complication ([place])   . Dyslipidemia   . History of rectal cancer    dx 2006 --   Stage III, T3  N1  M0,  s/p  chemoradiation (07-10-2005 to 09-05-2005) and anterior colon resection 11-04-2006  . History of shingles   . Hyperlipidemia   . Hypertension   . Iron deficiency anemia    intolerent PO iron--  gets IV iron,  last one 03-12-2015 ([place])  . Organic impotence   . Rectal cancer ([place])    adenocarcinoma stage lll    Past Surgical History:  Procedure Laterality Date  . ANTERIOR COLON RESECTION W/ COLOSTOMY  11-04-2006  . CATARACT EXTRACTION W/ INTRAOCULAR LENS IMPLANT Right sept 2015  &  feb 2016  . COLONOSCOPY WITH ESOPHAGOGASTRODUODENOSCOPY (EGD)  last one 03-07-2010  . COLONOSCOPY WITH PROPOFOL N/A 10/23/2015   Procedure: COLONOSCOPY WITH PROPOFOL;  Surgeon: Garlan Fair, MD;  Location: [facility];  Service: Endoscopy;  Laterality: N/A;  . COLOSTOMY TAKEDOWN  june 2007  . EXAMINATION UNDER ANESTHESIA N/A 04/23/2015   Procedure: EXAM UNDER ANESTHESIA, MANIPULATION OF PENILE PROSTHESIS PUMP;  Surgeon: Kathie Rhodes, MD;  Location: [facility];  Service: Urology;  Laterality: N/A;  . PENILE PROSTHESIS PLACEMENT  10-29-2009  . PORT A CATH PLACEMENT  07-23-2005  . prolapsed hemorrhoid      Current Outpatient Medications  Medication Sig Dispense Refill  . AMBULATORY NON FORMULARY MEDICATION Medication Name: Nitroglycerin 0.125% gel. Apply pea size amount to the rectum three times a day for 6-8 weeks 30 g 0  . amLODipine (NORVASC) 10 MG tablet Take 10 mg by mouth every morning.     . Cholecalciferol (VITAMIN D3) 50 MCG (2000 UT) capsule 1 capsule    . ciprofloxacin (CIPRO) 500 MG tablet Take 1 tablet (500 mg total) by mouth 2 (two) times daily. 14 tablet 0  . colestipol (COLESTID) 1 g tablet Take 1 tablet (1 g total) by mouth 2 (two) times daily. 180 tablet 3  . dicyclomine (BENTYL) 20 MG tablet Take 20 mg by mouth every 6 (six) hours. As needed    . empagliflozin (JARDIANCE) 25 MG TABS tablet Take 25 mg by mouth daily.    . Ferrous Sulfate  (SLOW FE) 142 (45 Fe) MG TBCR 1 tablet    . fish oil-omega-3 fatty acids 1000 MG capsule Take 1,500 mg by mouth daily.    . fluocinonide cream (LIDEX) 0.05 % See admin instructions.    . gabapentin (NEURONTIN) 300 MG capsule Take 300 mg by mouth 3 (three) times daily.   0  . ibuprofen (ADVIL,MOTRIN) 200 MG tablet Take 200 mg by mouth every 6 (six) hours as needed for fever or moderate pain.    Marland Kitchen insulin glargine (LANTUS) 100 UNIT/ML injection Inject 40-42 Units into the skin at bedtime.     . insulin lispro (HUMALOG) 100 UNIT/ML injection Inject 16-40 Units into the skin 3 (three) times daily before meals. Sliding scan  16units am, 25-30units lunch. 35-40units supper    . INVOKANA 300 MG TABS tablet Take 300 mg by mouth daily before breakfast.  2  . ipratropium (ATROVENT) 0.06 % nasal spray 2 sprays in each nostril    . labetalol (NORMODYNE) 300 MG tablet Take 300 mg by mouth 2 (two) times daily.    Marland Kitchen  lactulose (CEPHULAC) 10 g packet Take 10 g by mouth every other day.    . lisinopril (PRINIVIL,ZESTRIL) 40 MG tablet Take 40 mg by mouth every evening.     . metFORMIN (GLUCOPHAGE) 1000 MG tablet Take 1,000 mg by mouth 2 (two) times daily with a meal.    . Multiple Vitamin (MULTIVITAMIN) tablet Take 1 tablet by mouth daily.    Marland Kitchen NOVOFINE 30G X 8 MM MISC Inject 1 packet into the skin as needed. BLOOD SUGARS  2  . NOVOLOG FLEXPEN 100 UNIT/ML FlexPen Inject 90 Units into the skin 3 (three) times daily with meals.   2  . pantoprazole (PROTONIX) 40 MG tablet Take 1 tablet (40 mg total) by mouth 2 (two) times daily. 60 tablet 2  . polyvinyl alcohol (LIQUIFILM TEARS) 1.4 % ophthalmic solution Place 1 drop into both eyes as needed for dry eyes.    . rosuvastatin (CRESTOR) 10 MG tablet Take 1 tablet (10 mg total) by mouth daily. 90 tablet 3  . SYNJARDY XR 25-1000 MG TB24     . VALACYCLOVIR HCL PO Take 1 mg by mouth daily.    Marland Kitchen zolpidem (AMBIEN) 10 MG tablet Take 10 mg by mouth at bedtime.     No current  facility-administered medications for this visit.    Allergies as of 05/23/2021 - Review Complete 05/23/2021  Allergen Reaction Noted  . Cardizem cd [diltiazem hcl er beads] Swelling 07/04/2013  . Toprol xl [metoprolol] Other (See Comments) 07/04/2013  . Tricor [fenofibrate] Other (See Comments) 07/04/2013     Physical Exam: General:   Alert,  well-nourished, pleasant and cooperative in NAD Head:  Normocephalic and atraumatic. Eyes:  Sclera clear, no icterus.   Conjunctiva pink. Ears:  Normal auditory acuity. Nose:  No deformity, discharge,  or lesions. Mouth:  No deformity or lesions.   Neck:  Supple; no masses or thyromegaly. Lungs:  Clear throughout to auscultation.   No wheezes. Heart:  Regular rate and rhythm; no murmurs. Abdomen:  Soft, nontender, nondistended, normal bowel sounds, no rebound or guarding. No hepatosplenomegaly.   Neurologic:  Alert and  oriented x4;  grossly nonfocal Skin:  Intact without significant lesions or rashes. Psych:  Alert and cooperative. Normal mood and affect.     Bessie Boyte L. Tarri Glenn, MD, MPH 05/23/2021, 3:14 PM

## 2021-05-23 NOTE — Patient Instructions (Addendum)
If you are age 66 or older, your body mass index should be between 23-30. Your Body mass index is 32.59 kg/m. If this is out of the aforementioned range listed, please consider follow up with your Primary Care Provider.  We have sent the prescriptions to your pharmacy.  It was great seeing you today! Thank you for entrusting me with your care and choosing [facility].  Dr. Tarri Glenn

## 2021-05-28 ENCOUNTER — Telehealth: Payer: Self-pay

## 2021-05-28 DIAGNOSIS — E785 Hyperlipidemia, unspecified: Secondary | ICD-10-CM | POA: Diagnosis not present

## 2021-05-28 DIAGNOSIS — Z85048 Personal history of other malignant neoplasm of rectum, rectosigmoid junction, and anus: Secondary | ICD-10-CM | POA: Diagnosis not present

## 2021-05-28 DIAGNOSIS — C20 Malignant neoplasm of rectum: Secondary | ICD-10-CM | POA: Diagnosis not present

## 2021-05-28 DIAGNOSIS — E119 Type 2 diabetes mellitus without complications: Secondary | ICD-10-CM | POA: Diagnosis not present

## 2021-05-28 DIAGNOSIS — D509 Iron deficiency anemia, unspecified: Secondary | ICD-10-CM | POA: Diagnosis not present

## 2021-05-28 DIAGNOSIS — E1142 Type 2 diabetes mellitus with diabetic polyneuropathy: Secondary | ICD-10-CM | POA: Diagnosis not present

## 2021-05-28 DIAGNOSIS — I10 Essential (primary) hypertension: Secondary | ICD-10-CM | POA: Diagnosis not present

## 2021-05-28 DIAGNOSIS — E1122 Type 2 diabetes mellitus with diabetic chronic kidney disease: Secondary | ICD-10-CM | POA: Diagnosis not present

## 2021-05-28 DIAGNOSIS — E1165 Type 2 diabetes mellitus with hyperglycemia: Secondary | ICD-10-CM | POA: Diagnosis not present

## 2021-05-28 NOTE — Telephone Encounter (Signed)
PRIOR AUTHORIZATION  PA initiation date: 05/28/21 Medication: Early completed electronically through [employer] My Meds: Yes  Will await insurance response re: approval/denial.  Delice Lesch (Key: [id number])  [employer] is processing your PA request and will respond shortly with next steps. You may close this dialog, return to your dashboard, and perform other tasks. To check for an update later, open this request again from your dashboard.  If you need assistance, please chat with CoverMyMeds or call us at [phone].  Delice Lesch Key: [id number] - Rx #: [id number] Need help? Call us at [phone] Status Sent to [place] 1.2GM dr tablets Form [facility] Electronic PA Form Original Claim Info [phone] Health Plan's Preferred Products SULFASALAZINE [id number] BALSALAZIDE DISODIUM [id number] MESALAMINE ER [id number]

## 2021-05-29 NOTE — Telephone Encounter (Signed)
Follow up:  Click to Chat with CoverMyMeds Questions? We're right here! Type to chat with [place].  ME: This PA was initiated yesterday. This response remains unchanged. Phillip Tate (Key: [id number])  [employer] is processing your PA request and will respond shortly with next steps. You may close this dialog, return to your dashboard, and perform other tasks. To check for an update later, open this request again from your dashboard.  If you need assistance, please chat with CoverMyMeds or call us at [phone].  Phillip Tate.: Hello! Thank you for chatting with CoverMyMeds. My name is Phillip Tate. and I will be with you shortly. We appreciate your patience as we work to help each user.  ME: I have not received anything re: the next steps as indicated  Phillip Tate.: Just a moment as I take a look for you!  Phillip Tate.: I do see that the demographics were sent successfully to the plan but the plan did not return clinical questions yet. An insurance plan will usually respond fairly quickly with questions. However, they do have up to 24 hours to respond.  Phillip Tate.: Since it now past 24 hours since the demographics have been sent, I would recommend renewing this PA to see if we can get the clinical questions to return immediately on a renewed key. Are you familiar with how to renew?  ME: Yes  ME: Now it says there is an existing case  Phillip Tate.: Can you please share the renewed key with me?  ME: Delice Lesch Key: Virgilio Frees - PA Case ID: [id number] Need help? Call us at [phone] Outcome Additional Information Required There is an existing case within the [facility] environment that has the same patient, prescriber, and drug. This case must be finalized before proceeding with similar requests.  Phillip Tate.: Just one moment please.  Phillip Tate.: I do see this error message also. It does look like [employer] is recognizing this PA as a duplicate from the original key that did get the clinical questions to return.  Thank you for still giving that a try!  Phillip Tate.: In this case, I would recommend processing this PA request verbally with [facility]  Phillip Tate.: [employer] can be reached at [phone].

## 2021-05-31 DIAGNOSIS — E1122 Type 2 diabetes mellitus with diabetic chronic kidney disease: Secondary | ICD-10-CM | POA: Diagnosis not present

## 2021-06-12 DIAGNOSIS — E119 Type 2 diabetes mellitus without complications: Secondary | ICD-10-CM | POA: Diagnosis not present

## 2021-06-12 DIAGNOSIS — E785 Hyperlipidemia, unspecified: Secondary | ICD-10-CM | POA: Diagnosis not present

## 2021-06-12 DIAGNOSIS — E782 Mixed hyperlipidemia: Secondary | ICD-10-CM | POA: Diagnosis not present

## 2021-06-12 DIAGNOSIS — D509 Iron deficiency anemia, unspecified: Secondary | ICD-10-CM | POA: Diagnosis not present

## 2021-06-12 DIAGNOSIS — I10 Essential (primary) hypertension: Secondary | ICD-10-CM | POA: Diagnosis not present

## 2021-06-12 DIAGNOSIS — E1122 Type 2 diabetes mellitus with diabetic chronic kidney disease: Secondary | ICD-10-CM | POA: Diagnosis not present

## 2021-06-12 DIAGNOSIS — Z85048 Personal history of other malignant neoplasm of rectum, rectosigmoid junction, and anus: Secondary | ICD-10-CM | POA: Diagnosis not present

## 2021-06-12 DIAGNOSIS — C20 Malignant neoplasm of rectum: Secondary | ICD-10-CM | POA: Diagnosis not present

## 2021-06-12 DIAGNOSIS — N181 Chronic kidney disease, stage 1: Secondary | ICD-10-CM | POA: Diagnosis not present

## 2021-06-25 DIAGNOSIS — N181 Chronic kidney disease, stage 1: Secondary | ICD-10-CM | POA: Diagnosis not present

## 2021-06-25 DIAGNOSIS — E1122 Type 2 diabetes mellitus with diabetic chronic kidney disease: Secondary | ICD-10-CM | POA: Diagnosis not present

## 2021-06-25 DIAGNOSIS — Z794 Long term (current) use of insulin: Secondary | ICD-10-CM | POA: Diagnosis not present

## 2021-06-30 DIAGNOSIS — E1122 Type 2 diabetes mellitus with diabetic chronic kidney disease: Secondary | ICD-10-CM | POA: Diagnosis not present

## 2021-07-04 ENCOUNTER — Other Ambulatory Visit: Payer: Self-pay

## 2021-07-04 DIAGNOSIS — R110 Nausea: Secondary | ICD-10-CM

## 2021-07-04 DIAGNOSIS — R1013 Epigastric pain: Secondary | ICD-10-CM

## 2021-07-04 MED ORDER — PANTOPRAZOLE SODIUM 40 MG PO TBEC: 40.0000 mg | DELAYED_RELEASE_TABLET | Freq: Two times a day (BID) | ORAL | 3 refills | Status: DC

## 2021-07-30 DIAGNOSIS — E1122 Type 2 diabetes mellitus with diabetic chronic kidney disease: Secondary | ICD-10-CM | POA: Diagnosis not present

## 2021-07-31 DIAGNOSIS — R159 Full incontinence of feces: Secondary | ICD-10-CM | POA: Diagnosis not present

## 2021-07-31 DIAGNOSIS — K625 Hemorrhage of anus and rectum: Secondary | ICD-10-CM | POA: Diagnosis not present

## 2021-08-04 DIAGNOSIS — E1165 Type 2 diabetes mellitus with hyperglycemia: Secondary | ICD-10-CM | POA: Diagnosis not present

## 2021-08-04 DIAGNOSIS — E1122 Type 2 diabetes mellitus with diabetic chronic kidney disease: Secondary | ICD-10-CM | POA: Diagnosis not present

## 2021-08-04 DIAGNOSIS — E782 Mixed hyperlipidemia: Secondary | ICD-10-CM | POA: Diagnosis not present

## 2021-08-04 DIAGNOSIS — E139 Other specified diabetes mellitus without complications: Secondary | ICD-10-CM | POA: Diagnosis not present

## 2021-08-04 DIAGNOSIS — E119 Type 2 diabetes mellitus without complications: Secondary | ICD-10-CM | POA: Diagnosis not present

## 2021-08-04 DIAGNOSIS — N181 Chronic kidney disease, stage 1: Secondary | ICD-10-CM | POA: Diagnosis not present

## 2021-08-04 DIAGNOSIS — I10 Essential (primary) hypertension: Secondary | ICD-10-CM | POA: Diagnosis not present

## 2021-08-04 DIAGNOSIS — E1142 Type 2 diabetes mellitus with diabetic polyneuropathy: Secondary | ICD-10-CM | POA: Diagnosis not present

## 2021-08-07 DIAGNOSIS — R509 Fever, unspecified: Secondary | ICD-10-CM | POA: Diagnosis not present

## 2021-08-07 DIAGNOSIS — R110 Nausea: Secondary | ICD-10-CM | POA: Diagnosis not present

## 2021-08-07 DIAGNOSIS — R197 Diarrhea, unspecified: Secondary | ICD-10-CM | POA: Diagnosis not present

## 2021-08-07 DIAGNOSIS — R52 Pain, unspecified: Secondary | ICD-10-CM | POA: Diagnosis not present

## 2021-08-09 DIAGNOSIS — R52 Pain, unspecified: Secondary | ICD-10-CM | POA: Diagnosis not present

## 2021-08-09 DIAGNOSIS — R509 Fever, unspecified: Secondary | ICD-10-CM | POA: Diagnosis not present

## 2021-08-09 DIAGNOSIS — R197 Diarrhea, unspecified: Secondary | ICD-10-CM | POA: Diagnosis not present

## 2021-08-09 DIAGNOSIS — R110 Nausea: Secondary | ICD-10-CM | POA: Diagnosis not present

## 2021-08-15 DIAGNOSIS — R198 Other specified symptoms and signs involving the digestive system and abdomen: Secondary | ICD-10-CM | POA: Diagnosis not present

## 2021-08-15 DIAGNOSIS — C20 Malignant neoplasm of rectum: Secondary | ICD-10-CM | POA: Diagnosis not present

## 2021-08-15 DIAGNOSIS — R159 Full incontinence of feces: Secondary | ICD-10-CM | POA: Diagnosis not present

## 2021-08-16 ENCOUNTER — Telehealth: Payer: Self-pay | Admitting: Gastroenterology

## 2021-08-16 NOTE — Telephone Encounter (Signed)
Spoke with the wife of the patient. She said this was something Dr Lysle Rubens wanted done. She does not know if he was going to place the referral. She wants everything as quickly as possible. The patient has "suffered much" with his problem.  Called to Dr Glenna Durand office at [facility]. Left a voicemail for the assistant inquiring and requesting a call back to discuss. I gave her my direct phone number to expedite the communication. (Dr Tarri Glenn patient)

## 2021-08-16 NOTE — Telephone Encounter (Signed)
Inbound call from wife. States she have orders to schedule Anorectal Manometry. [phone]

## 2021-08-19 NOTE — Telephone Encounter (Signed)
I have not heard back from the PCP office. Do you want to reach out to  Dr Lysle Rubens?

## 2021-08-19 NOTE — Telephone Encounter (Signed)
I think this goes to you Beth, I have not called him.

## 2021-08-19 NOTE — Telephone Encounter (Signed)
Spoke with the spouse. She said the plan will be to take the test results to the surgeon. She will ask Dr Lysle Rubens for the referral to have an anorectal manometry. The results will be sent to the ordering provider to determine the next step. I will treat this as an outside referral once the order is received.

## 2021-08-19 NOTE — Telephone Encounter (Signed)
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 In this setting, I would recommend that this evaluation be performed at [facility] under the guidance of his surgeon. Thanks.

## 2021-08-19 NOTE — Telephone Encounter (Signed)
Noted! Thank you

## 2021-08-20 ENCOUNTER — Telehealth: Payer: Self-pay

## 2021-08-20 ENCOUNTER — Other Ambulatory Visit: Payer: Self-pay

## 2021-08-20 ENCOUNTER — Other Ambulatory Visit: Payer: Self-pay | Admitting: Internal Medicine

## 2021-08-20 DIAGNOSIS — C20 Malignant neoplasm of rectum: Secondary | ICD-10-CM

## 2021-08-20 DIAGNOSIS — R159 Full incontinence of feces: Secondary | ICD-10-CM

## 2021-08-20 NOTE — Telephone Encounter (Signed)
Spoke with the spouse. Referral received.

## 2021-08-20 NOTE — Telephone Encounter (Signed)
Referral for anorectal manometry.  Provider Oneida Arenas, MD  Patient's spouse contacted and instructed for procedure. Written instructions will be mailed.   Appointment date 09/04/21 at 12:30 pm in the [place].

## 2021-08-29 DIAGNOSIS — E1122 Type 2 diabetes mellitus with diabetic chronic kidney disease: Secondary | ICD-10-CM | POA: Diagnosis not present

## 2021-09-01 ENCOUNTER — Other Ambulatory Visit: Payer: Self-pay

## 2021-09-01 ENCOUNTER — Ambulatory Visit
Admission: RE | Admit: 2021-09-01 | Discharge: 2021-09-01 | Disposition: A | Discharge status: Home / Self Care | Payer: Medicare HMO | Source: Ambulatory Visit | Attending: Internal Medicine | Admitting: Internal Medicine

## 2021-09-01 DIAGNOSIS — C20 Malignant neoplasm of rectum: Secondary | ICD-10-CM

## 2021-09-04 ENCOUNTER — Ambulatory Visit (HOSPITAL_COMMUNITY)
Admission: RE | Admit: 2021-09-04 | Discharge: 2021-09-04 | Disposition: A | Discharge status: Home / Self Care | Payer: Medicare HMO | Attending: Gastroenterology | Admitting: Gastroenterology

## 2021-09-04 ENCOUNTER — Encounter (HOSPITAL_COMMUNITY): Payer: Self-pay | Admitting: Gastroenterology

## 2021-09-04 ENCOUNTER — Encounter (HOSPITAL_COMMUNITY)
Admission: RE | Disposition: A | Discharge status: Home / Self Care | Payer: Self-pay | Source: Home / Self Care | Attending: Gastroenterology

## 2021-09-04 DIAGNOSIS — J310 Chronic rhinitis: Secondary | ICD-10-CM | POA: Diagnosis not present

## 2021-09-04 DIAGNOSIS — R159 Full incontinence of feces: Secondary | ICD-10-CM | POA: Diagnosis not present

## 2021-09-04 DIAGNOSIS — J342 Deviated nasal septum: Secondary | ICD-10-CM | POA: Diagnosis not present

## 2021-09-04 DIAGNOSIS — H6123 Impacted cerumen, bilateral: Secondary | ICD-10-CM | POA: Diagnosis not present

## 2021-09-04 DIAGNOSIS — J343 Hypertrophy of nasal turbinates: Secondary | ICD-10-CM | POA: Diagnosis not present

## 2021-09-04 DIAGNOSIS — H903 Sensorineural hearing loss, bilateral: Secondary | ICD-10-CM | POA: Diagnosis not present

## 2021-09-04 DIAGNOSIS — H9312 Tinnitus, left ear: Secondary | ICD-10-CM | POA: Diagnosis not present

## 2021-09-04 HISTORY — PX: ANAL RECTAL MANOMETRY: SHX6358

## 2021-09-04 SURGERY — MANOMETRY, ANORECTAL

## 2021-09-04 NOTE — Progress Notes (Signed)
Anorectal manometry performed per protocol.  Patient tolerated well.  50 cc anorectal expulsion balloon placed.  Patient unable to hold balloon in to walk to toilet.  Report to be sent to Dr Harl Bowie.

## 2021-09-05 ENCOUNTER — Encounter (HOSPITAL_COMMUNITY): Payer: Self-pay | Admitting: Gastroenterology

## 2021-09-07 ENCOUNTER — Inpatient Hospital Stay
Admission: RE | Admit: 2021-09-07 | Payer: Medicare HMO | Source: Ambulatory Visit | Attending: Internal Medicine | Admitting: Internal Medicine

## 2021-09-07 ENCOUNTER — Ambulatory Visit
Admission: RE | Admit: 2021-09-07 | Discharge: 2021-09-07 | Disposition: A | Discharge status: Home / Self Care | Payer: Medicare HMO | Source: Ambulatory Visit | Attending: Internal Medicine | Admitting: Internal Medicine

## 2021-09-07 DIAGNOSIS — K6289 Other specified diseases of anus and rectum: Secondary | ICD-10-CM | POA: Diagnosis not present

## 2021-09-07 DIAGNOSIS — Z85048 Personal history of other malignant neoplasm of rectum, rectosigmoid junction, and anus: Secondary | ICD-10-CM | POA: Diagnosis not present

## 2021-09-07 DIAGNOSIS — C20 Malignant neoplasm of rectum: Secondary | ICD-10-CM

## 2021-09-07 DIAGNOSIS — K6389 Other specified diseases of intestine: Secondary | ICD-10-CM | POA: Diagnosis not present

## 2021-09-07 DIAGNOSIS — Z9889 Other specified postprocedural states: Secondary | ICD-10-CM | POA: Diagnosis not present

## 2021-09-07 MED ORDER — GADOBENATE DIMEGLUMINE 529 MG/ML IV SOLN
20.0000 mL | Freq: Once | INTRAVENOUS | Status: AC | PRN
  Administered 2021-09-07: 20 mL via INTRAVENOUS

## 2021-09-10 DIAGNOSIS — E1122 Type 2 diabetes mellitus with diabetic chronic kidney disease: Secondary | ICD-10-CM | POA: Diagnosis not present

## 2021-09-10 DIAGNOSIS — Z794 Long term (current) use of insulin: Secondary | ICD-10-CM | POA: Diagnosis not present

## 2021-09-10 DIAGNOSIS — E291 Testicular hypofunction: Secondary | ICD-10-CM | POA: Diagnosis not present

## 2021-09-10 DIAGNOSIS — N181 Chronic kidney disease, stage 1: Secondary | ICD-10-CM | POA: Diagnosis not present

## 2021-09-11 DIAGNOSIS — E291 Testicular hypofunction: Secondary | ICD-10-CM | POA: Diagnosis not present

## 2021-09-11 DIAGNOSIS — E119 Type 2 diabetes mellitus without complications: Secondary | ICD-10-CM | POA: Diagnosis not present

## 2021-09-11 DIAGNOSIS — Z7984 Long term (current) use of oral hypoglycemic drugs: Secondary | ICD-10-CM | POA: Diagnosis not present

## 2021-09-11 DIAGNOSIS — E1122 Type 2 diabetes mellitus with diabetic chronic kidney disease: Secondary | ICD-10-CM | POA: Diagnosis not present

## 2021-09-11 DIAGNOSIS — N181 Chronic kidney disease, stage 1: Secondary | ICD-10-CM | POA: Diagnosis not present

## 2021-09-15 ENCOUNTER — Other Ambulatory Visit: Payer: Medicare HMO

## 2021-09-17 DIAGNOSIS — E291 Testicular hypofunction: Secondary | ICD-10-CM | POA: Diagnosis not present

## 2021-09-17 DIAGNOSIS — Z794 Long term (current) use of insulin: Secondary | ICD-10-CM | POA: Diagnosis not present

## 2021-09-17 DIAGNOSIS — N181 Chronic kidney disease, stage 1: Secondary | ICD-10-CM | POA: Diagnosis not present

## 2021-09-17 DIAGNOSIS — E1122 Type 2 diabetes mellitus with diabetic chronic kidney disease: Secondary | ICD-10-CM | POA: Diagnosis not present

## 2021-09-18 ENCOUNTER — Telehealth: Payer: Self-pay | Admitting: Gastroenterology

## 2021-09-18 NOTE — Telephone Encounter (Signed)
Inbound call from pt's spouse requesting a call back stating that the pt had a procedure on 09/04/21 by Dr. Silverio Decamp and they are requesting the results from that procedure. Please advise. Thank you.

## 2021-09-19 NOTE — Telephone Encounter (Signed)
Inbound call from patient wife following up.

## 2021-09-19 NOTE — Telephone Encounter (Signed)
Called patient's spouse back. No answer. I do not have any results yet. I will call her as soon as the report has been given to me. Thank you for being patient. I do realize that we promised that we would have something in about 2 weeks. I expect the report will be ready soon.

## 2021-09-20 ENCOUNTER — Other Ambulatory Visit: Payer: Self-pay | Admitting: Gastroenterology

## 2021-09-24 ENCOUNTER — Ambulatory Visit: Payer: Medicare HMO | Admitting: Gastroenterology

## 2021-09-24 ENCOUNTER — Encounter: Payer: Self-pay | Admitting: Gastroenterology

## 2021-09-24 ENCOUNTER — Telehealth: Payer: Self-pay | Admitting: Gastroenterology

## 2021-09-24 ENCOUNTER — Other Ambulatory Visit: Payer: Self-pay

## 2021-09-24 ENCOUNTER — Telehealth: Payer: Self-pay

## 2021-09-24 VITALS — BP 100/60 | HR 76 | Ht 68.0 in | Wt 221.4 lb

## 2021-09-24 DIAGNOSIS — K625 Hemorrhage of anus and rectum: Secondary | ICD-10-CM

## 2021-09-24 DIAGNOSIS — K91850 Pouchitis: Secondary | ICD-10-CM | POA: Diagnosis not present

## 2021-09-24 DIAGNOSIS — R195 Other fecal abnormalities: Secondary | ICD-10-CM

## 2021-09-24 DIAGNOSIS — R159 Full incontinence of feces: Secondary | ICD-10-CM | POA: Diagnosis not present

## 2021-09-24 MED ORDER — CIPROFLOXACIN HCL 500 MG PO TABS: 500.0000 mg | ORAL_TABLET | Freq: Two times a day (BID) | ORAL | 0 refills | Status: AC

## 2021-09-24 MED ORDER — MESALAMINE 1.2 G PO TBEC: 2.4000 g | DELAYED_RELEASE_TABLET | Freq: Every day | ORAL | 1 refills | Status: DC

## 2021-09-24 NOTE — Telephone Encounter (Signed)
Called pt to request names and phone #'s for his previous surgeons in [place], [place] and [place]. Pt states he is driving and will respond in 1 hour. Also requests that I send a My Chart message with this information. Will await his response. Below is a copy of the My Chart message:  Hi Mr. Luppino,  I have records that I am both needing to request and send to your surgeons at [place] and in [place], [place]. Could you please provide their names and phone #'s.  Thank you, Aleatha Borer, LPN

## 2021-09-24 NOTE — Telephone Encounter (Signed)
Anorectal manometry report has been faxed to providers listed below with confirmations received:  Creig Hines, MD  [facility] [place] [address] [phone]  Olena Leatherwood, M.D. [facility], [employer] building  [address], [address] [place] [address] (P) [phone] (F) [phone]

## 2021-09-24 NOTE — Patient Instructions (Addendum)
It was my pleasure to provide care to you today. Based on our discussion, I am providing you with my recommendations below:  RECOMMENDATION(S):   Avoid all non-steroidal anti-inflammatory medications.  Take daily probiotics such as VSL3.  Start taking Lialda 2.4g daily. This is a low dose and we can increase the dose if the future if needed.  I have given you a prescription of Cipro 500 mg twice daily for 14 days.  I would like to obtain records from the surgeon at [facility].  I will ask Dr. Silverio Decamp to interpret your anorectal manometry test that was ordered by Dr. Lysle Rubens.    FOLLOW UP:  I would like for you to follow up with me in . Please call the office at [phone] to schedule your appointment. After your procedure, you will receive a call from my office staff regarding my recommendation for follow up.  BMI:  If you are age 68 or older, your body mass index should be between 23-30. Your There is no height or weight on file to calculate BMI. If this is out of the aforementioned range listed, please consider follow up with your Primary Care Provider.   MY CHART:  The [facility] providers would like to encourage you to use [facility] to communicate with providers for non-urgent requests or questions.  Due to long hold times on the telephone, sending your provider a message by [facility] may be a faster and more efficient way to get a response.  Please allow 48 business hours for a response.  Please remember that this is for non-urgent requests.   Thank you for trusting me with your gastrointestinal care!    [place], MD, MPH

## 2021-09-24 NOTE — Telephone Encounter (Signed)
Received notification from pt insurance and pharmacy indicating that insurance prefers either Sulfasalazine or Balsalazide rather than Mesalamine. Routing this message to Dr. Tarri Glenn to determine if she would prefer to change Rx to covered alternatives and if so, what dosage/frequency.

## 2021-09-24 NOTE — Telephone Encounter (Signed)
Anorectal manometry ordered by Dr. Lysle Rubens. Patient requested results from me today. Results received after the patient left the office. 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 He should follow-up with his surgeon for evaluation and management. Please be sure that his surgeons at [facility] and in [place] receive a copy of these results. Thanks.

## 2021-09-24 NOTE — Telephone Encounter (Signed)
PRIOR AUTHORIZATION - PENDING ELIGIBILITY  PA initiation date: 09/24/21  Medication: Mesalamine Insurance Company: [employer] completed electronically through [employer] My Meds: Yes  There was an error with your request  ? Eligibility could not be verified for this patient - patient not found. Please review patient information and re-submit.

## 2021-09-24 NOTE — Progress Notes (Signed)
Referring Provider: Wenda Low, MD Primary Care Physician:  Wenda Low, MD  Chief complaint:  Rectal pain and bleeding   IMPRESSION:  Chronic rectal mucous with intermittent streaking     - previously attributed to internal hemorrhoids and prolapsed J pouch Fecal incontinence with very weak internal and external anal sphincters History of colon polyps     - tubular adenoma on colonoscopy 2016    - 2 tubular adenomas on colonoscopy 2021    - surveillance recommended in 5 years given the history of rectal cancer History of rectal adenocarcinoma s/p chemoradiation and J pouch 2006 Microcytosis with normal hemoglobin    - iron 59, % sat 13  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 Add Lialda with discussion today about the indications and use. May increase the dose if this is not adequate. Avoid NSAIDs. Consider VSL3 probiotics if symptoms not improving, as well as lowFODMAP diet. Encourage follow-up at [facility] in the IBD clinic when he follows up with his surgeons.    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  Anorectal manometry ordered by Dr. Lysle Rubens shows very weak internal and external anal sphincters. We will fax a copy of the report to his team at [facility] and his surgeon in [place] (he will contact us with that doctor's information).  Continue with stool bulking agent. He has not tolerated cholestyramine or colestipol.   PLAN: - Avoid NSAIDs - Consider VSL3 probiotics - Cipro 500 mg BID x 14 days  - Start Lialda 2.4g daily - Consider low FOMAP diet - Obtain records from surgeon in [place], [place] - Follow-up at [facility] for ongoing care, last seen 2021, consider follow-up in [facility] for management of pouchitis   HPI: Phillip Tate is a 65 y.o. male who is seen in follow-up  requesting antibiotics.   Followed by Dr. Hester Mates at [facility]. He was diagnosed with stage IIIA rectal adenocarcinoma 2006 s/p neoadjuvant chemoradio therapy, s/p intersphincteric proctectomy with colonic J-pouch and inverting loop ileostomy 10/2005, s/p loop ileostomy takedown 05/2006, hemorrhoidectomy 10/2018, and repair of prolapsed J pouch 11/2018. Has been seen by Dr. Hester Mates for mucous stools with blood streaking and leakage. His notes: "I think this is a functional issue with his J pouch. I have nothing to offer from a surgical standpoint."  At the time of his initial consultation 11/27/20 he reported fecal soiling that occurs with walking. Wears adult pull-ups. Started cholestyramine powder 2 weeks ago (insurance did not cover colestipol), as well as dicyclomine 20 mg PRN, pantroprazole 20 mg, lactulose.  Did not find the cholestryamine helpful.  Colonoscopy 11/28/20 showed decreased sphincter tone, anastomosis, and 4 small polyps, two of which were tubular adenomas and two were lymphoid aggregates.   Pelvic PT with Earlie Counts x 2 did not help.   Developed epigastric pain in [place] with associated nausea, dry heaves. EGD 04/18/21 showed chronic gastritis without H pylori. Duodenal biopsies were normal.   He continues to have ongoing mucous and rectal bleeding. Purgative PRN helps with the mucous.  Intermittent antibiotics for 2 weeks also helps, but symptoms return within a few weeks of completing antibiotics. Colestipol did not help. Dicyclomine PRN for some relief of the liquid stools but it has been inconsistent and he believes that it caused problems with his sinuses.  He is also using Imodium PRN.  Tried it on Lialda 2.4  g daily. He initially reported that this was not helpful, but, it doesn't sound like he actually filled the prescription. He didn't find it helpful.   Seen by a rectal surgeon in [place]. He recommended anorectal manometry. This was subsequently ordered by Dr. Lysle Rubens.  Awaiting the results of those tests.    Endoscopic history:  - Prior endoscopy in 2011 with Dr. Earle Gell with normal small bowel biopsies. Tubular adenoma with Dr. Colonoscopy 2016.  - Dr. Clinton Gallant with colonoscopy 2019 with a 22mm sigmoid polyp removed from the sigmoid. Colonic mucosa appeared normal.  Surveillance recommended in 5 years.  - Colonoscopy 11/28/20 showed decreased sphincter tone, anastomosis, and 4 small polyps, two of which were tubular adenomas and two were lymphoid aggregates.   Anorectal manometry 09/04/21 show very weak internal and external anal sphincters.   Past Medical History:  Diagnosis Date   Coronary artery disease    DDD (degenerative disc disease), lumbar    Diabetes mellitus without complication ([place])    Dyslipidemia    History of rectal cancer    dx 2006 --  Stage III, T3  N1  M0,  s/p  chemoradiation (07-10-2005 to 09-05-2005) and anterior colon resection 11-04-2006   History of shingles    Hyperlipidemia    Hypertension    Iron deficiency anemia    intolerent PO iron--  gets IV iron,  last one 03-12-2015 ([place])   Organic impotence    Rectal cancer (HCC)    adenocarcinoma stage lll    Past Surgical History:  Procedure Laterality Date   ANAL RECTAL MANOMETRY N/A 09/04/2021   Procedure: ANO RECTAL MANOMETRY;  Surgeon: Mauri Pole, MD;  Location: [facility];  Service: Endoscopy;  Laterality: N/A;   ANTERIOR COLON RESECTION W/ COLOSTOMY  11-04-2006   CATARACT EXTRACTION W/ INTRAOCULAR LENS IMPLANT Right sept 2015  &  feb 2016   COLONOSCOPY WITH ESOPHAGOGASTRODUODENOSCOPY (EGD)  last one 03-07-2010   COLONOSCOPY WITH PROPOFOL N/A 10/23/2015   Procedure: COLONOSCOPY WITH PROPOFOL;  Surgeon: Garlan Fair, MD;  Location: [facility];  Service: Endoscopy;  Laterality: N/A;   COLOSTOMY TAKEDOWN  june 2007   EXAMINATION UNDER ANESTHESIA N/A 04/23/2015   Procedure: EXAM UNDER ANESTHESIA, MANIPULATION OF PENILE PROSTHESIS PUMP;  Surgeon:  Kathie Rhodes, MD;  Location: [facility];  Service: Urology;  Laterality: N/A;   PENILE PROSTHESIS PLACEMENT  10-29-2009   PORT A CATH PLACEMENT  07-23-2005   prolapsed hemorrhoid      Current Outpatient Medications  Medication Sig Dispense Refill   AMBULATORY NON FORMULARY MEDICATION Medication Name: Nitroglycerin 0.125% gel. Apply pea size amount to the rectum three times a day for 6-8 weeks 30 g 0   amLODipine (NORVASC) 10 MG tablet Take 10 mg by mouth every morning.      Cholecalciferol (VITAMIN D3) 50 MCG (2000 UT) capsule 1 capsule     ciprofloxacin (CIPRO) 500 MG tablet Take 1 tablet (500 mg total) by mouth 2 (two) times daily for 14 days. 28 tablet 0   colestipol (COLESTID) 1 g tablet Take 1 tablet (1 g total) by mouth 2 (two) times daily. 180 tablet 3   dicyclomine (BENTYL) 20 MG tablet Take 20 mg by mouth every 6 (six) hours. As needed     empagliflozin (JARDIANCE) 25 MG TABS tablet Take 25 mg by mouth daily.     Ferrous Sulfate (SLOW FE) 142 (45 Fe) MG TBCR 1 tablet     fish oil-omega-3 fatty acids 1000 MG  capsule Take 1,500 mg by mouth daily.     fluocinonide cream (LIDEX) 0.05 % See admin instructions.     gabapentin (NEURONTIN) 300 MG capsule Take 300 mg by mouth 3 (three) times daily.   0   ibuprofen (ADVIL,MOTRIN) 200 MG tablet Take 200 mg by mouth every 6 (six) hours as needed for fever or moderate pain.     insulin glargine (LANTUS) 100 UNIT/ML injection Inject 40-42 Units into the skin at bedtime.      insulin lispro (HUMALOG) 100 UNIT/ML injection Inject 16-40 Units into the skin 3 (three) times daily before meals. Sliding scan  16units am, 25-30units lunch. 35-40units supper     INVOKANA 300 MG TABS tablet Take 300 mg by mouth daily before breakfast.  2   ipratropium (ATROVENT) 0.06 % nasal spray 2 sprays in each nostril     labetalol (NORMODYNE) 300 MG tablet Take 300 mg by mouth 2 (two) times daily.     lactulose (CEPHULAC) 10 g packet Take 10 g by mouth  every other day.     lisinopril (PRINIVIL,ZESTRIL) 40 MG tablet Take 40 mg by mouth every evening.      metFORMIN (GLUCOPHAGE) 1000 MG tablet Take 1,000 mg by mouth 2 (two) times daily with a meal.     Multiple Vitamin (MULTIVITAMIN) tablet Take 1 tablet by mouth daily.     NOVOFINE 30G X 8 MM MISC Inject 1 packet into the skin as needed. BLOOD SUGARS  2   NOVOLOG FLEXPEN 100 UNIT/ML FlexPen Inject 90 Units into the skin 3 (three) times daily with meals.   2   pantoprazole (PROTONIX) 40 MG tablet Take 1 tablet (40 mg total) by mouth 2 (two) times daily. 180 tablet 3   polyvinyl alcohol (LIQUIFILM TEARS) 1.4 % ophthalmic solution Place 1 drop into both eyes as needed for dry eyes.     rosuvastatin (CRESTOR) 10 MG tablet Take 1 tablet (10 mg total) by mouth daily. 90 tablet 3   SYNJARDY XR 25-1000 MG TB24      VALACYCLOVIR HCL PO Take 1 mg by mouth daily.     zolpidem (AMBIEN) 10 MG tablet Take 10 mg by mouth at bedtime.     mesalamine (LIALDA) 1.2 g EC tablet Take 2 tablets (2.4 g total) by mouth daily with breakfast. 60 tablet 1   No current facility-administered medications for this visit.    Allergies as of 09/24/2021 - Review Complete 09/24/2021  Allergen Reaction Noted   Cardizem cd [diltiazem hcl er beads] Swelling 07/04/2013   Toprol xl [metoprolol] Other (See Comments) 07/04/2013   Tricor [fenofibrate] Other (See Comments) 07/04/2013     Physical Exam: General:   Alert,  well-nourished, pleasant and cooperative in NAD Head:  Normocephalic and atraumatic. Eyes:  Sclera clear, no icterus.   Conjunctiva pink. Ears:  Normal auditory acuity. Nose:  No deformity, discharge,  or lesions. Mouth:  No deformity or lesions.   Neck:  Supple; no masses or thyromegaly. Lungs:  Clear throughout to auscultation.   No wheezes. Heart:  Regular rate and rhythm; no murmurs. Abdomen:  Soft, nontender, nondistended, normal bowel sounds, no rebound or guarding. No hepatosplenomegaly.    Neurologic:  Alert and  oriented x4;  grossly nonfocal Skin:  Intact without significant lesions or rashes. Psych:  Alert and cooperative. Normal mood and affect.     Aubri Gathright L. Tarri Glenn, MD, MPH 09/27/2021, 1:10 PM

## 2021-09-24 NOTE — Progress Notes (Signed)
error 

## 2021-09-26 NOTE — Telephone Encounter (Signed)
Pt's wife called inquiring about mano results. She would like a call back asap with results.

## 2021-09-26 NOTE — Telephone Encounter (Signed)
Would you take over this case?

## 2021-09-26 NOTE — Telephone Encounter (Signed)
Conversation: Anorectal Manometry results (Newest Message First) September 26, 2021 Me to Zyden, Suman     4:44 PM Hi Ocie,   Dr. Tarri Glenn has reviewed the results of your anorectal manometry. She has provided the following response and recommendations:   Findings show very weak internal and external anal sphincters. He should follow-up with his surgeon for evaluation and management.    Thank you,  [facility]

## 2021-09-26 NOTE — Telephone Encounter (Signed)
Called pt and made him aware of results as well as informing him that this information has been sent via My Chart. Verbalized acceptance and understanding.

## 2021-09-28 DIAGNOSIS — E1122 Type 2 diabetes mellitus with diabetic chronic kidney disease: Secondary | ICD-10-CM | POA: Diagnosis not present

## 2021-10-03 NOTE — Telephone Encounter (Signed)
APPROVAL  Medication: Mesalamine Insurance Company: [employer] PA response: Approved Approval dates: 12/29/20 through 12/28/22 Misc. Notes: Cory Kitt (Key: [id number])  This request has been approved.  Please note any additional information provided by [facility] at the bottom of your screen.  Delice Lesch Key: [id number] - PA Case ID: [id number] Need help? Call us at [phone] Outcome Approvedtoday PA Case: [id number], Status: Approved, Coverage Starts on: 12/29/2020 12:00:00 AM, Coverage Ends on: 12/28/2022 12:00:00 AM. Questions? Contact [phone]. Drug Mesalamine 1.2GM dr tablets Form [employer] Electronic PA Form

## 2021-10-04 ENCOUNTER — Telehealth: Payer: Self-pay | Admitting: Gastroenterology

## 2021-10-04 NOTE — Telephone Encounter (Signed)
Chart Review Routing History Since 10/05/2020 (View Full Routing History)  Recipients Sent On Sent By Routed [facility]   10/04/2021  9:53 AM Hailey Stormer Hardie Pulley, LPN IN BASKET: STANDARD HEADER      Cover Page Message : PA completed and IS covered by insurance as indicated

## 2021-10-04 NOTE — Telephone Encounter (Signed)
Inbound call from [place] states mesalamine is not covered by insurance. They are suggesting Sulfasalazine because it is covered.

## 2021-10-04 NOTE — Telephone Encounter (Signed)
APPROVAL   Medication: Mesalamine Insurance Company: [employer] PA response: Approved Approval dates: 12/29/20 through 12/28/22 Misc. Notes: Phillip Tate (Key: [id number])   This request has been approved.   Please note any additional information provided by [facility] at the bottom of your screen.   Phillip Tate Key: [id number] - PA Case ID: [id number] Need help? Call us at [phone] Outcome Approvedtoday PA Case: [id number], Status: Approved, Coverage Starts on: 12/29/2020 12:00:00 AM, Coverage Ends on: 12/28/2022 12:00:00 AM. Questions? Contact [phone]. Drug Mesalamine 1.2GM dr tablets Form [employer] Electronic PA Form

## 2021-10-14 DIAGNOSIS — E291 Testicular hypofunction: Secondary | ICD-10-CM | POA: Diagnosis not present

## 2021-10-14 DIAGNOSIS — E1122 Type 2 diabetes mellitus with diabetic chronic kidney disease: Secondary | ICD-10-CM | POA: Diagnosis not present

## 2021-10-14 DIAGNOSIS — Z7984 Long term (current) use of oral hypoglycemic drugs: Secondary | ICD-10-CM | POA: Diagnosis not present

## 2021-10-16 DIAGNOSIS — R159 Full incontinence of feces: Secondary | ICD-10-CM | POA: Diagnosis not present

## 2021-10-23 DIAGNOSIS — N181 Chronic kidney disease, stage 1: Secondary | ICD-10-CM | POA: Diagnosis not present

## 2021-10-23 DIAGNOSIS — Z794 Long term (current) use of insulin: Secondary | ICD-10-CM | POA: Diagnosis not present

## 2021-10-23 DIAGNOSIS — E291 Testicular hypofunction: Secondary | ICD-10-CM | POA: Diagnosis not present

## 2021-10-23 DIAGNOSIS — Z23 Encounter for immunization: Secondary | ICD-10-CM | POA: Diagnosis not present

## 2021-10-23 DIAGNOSIS — E1122 Type 2 diabetes mellitus with diabetic chronic kidney disease: Secondary | ICD-10-CM | POA: Diagnosis not present

## 2021-10-28 DIAGNOSIS — E1122 Type 2 diabetes mellitus with diabetic chronic kidney disease: Secondary | ICD-10-CM | POA: Diagnosis not present

## 2021-11-05 DIAGNOSIS — Z9049 Acquired absence of other specified parts of digestive tract: Secondary | ICD-10-CM | POA: Diagnosis not present

## 2021-11-05 DIAGNOSIS — K623 Rectal prolapse: Secondary | ICD-10-CM | POA: Diagnosis not present

## 2021-11-07 ENCOUNTER — Telehealth: Payer: Self-pay

## 2021-11-07 NOTE — Telephone Encounter (Signed)
Received notification from schedulers asking if an appt is required?  Appears pt sent My Chart scheduling request to our scheduling dept in hopes to schedule an appt with Dr. Tarri Glenn sometime between 11/14-11/18, any time of day. Further adds in is request that he wants to discuss surgery that has been scheduled 01/09/22. Review of CE indicates pt was seen at [facility] on 11/05/21. Will route this message to Dr. Tarri Glenn to determine if appt is needed. Following noted from CE:  Assessment/Plan: ICD-10-CM  1. History of low anterior resection of rectum Z90.49   2. 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 Andel is a 65 y.o. male who presents for evaluation of his persistent intermittent incontinence after having a colonic J pouch prolapse repair. We discussed the potential for proximal diversion with a transverse loop colostomy in his LUQ, after reiterating that this would not eliminate mucus production and that his incontinence might still cause him discomfort. However, a J pouch excision is highly morbid, and the relatively benign nature of his symptoms, while uncomfortable, may not square with the potential surgical risks he may incur. He and his family agreed to return to clinic after they have thought about the discussion regarding the risks and benefits of each. All questions were answered and they verbalized their understanding.  Future Appointments  This patient does not currently have any appointments scheduled.   Lucas Mallow, MD  Attending Attestation:  - Of the 40 minutes I spent with the patient, more than 50% was spent on counseling and coordination of care about dysfunctional J pouch.   Agree.  Fecal incontinent from a LAR/CAA/CJ pouch about 13 yrs ago. He has struggled with this for many years. A recent anal manometry study locally demonstrates no external/interal sphincter contraction.   He is very bothered by mucous drainage and incontinence.  He is essentially cured from his  rectal cancer. He has has mesh reconstruction of a ventral hernia (ileostomy site 20 cm pariatex)  He is home-bound from his incontinence.  Discussed options:  1. Continue as is, enema administration, etc, 2. Diverting loop transverse colostomy (1 hr operation) 3. Colonic J pouch excision. Complicated 5-6 hr operation, needs ureteric stents, mesh excision. He is obese with undoubtedly a narrow male pelvis. I discussed complications of injury to other organs, bleeding, wound infection, etc.   I recommend #2 but he wishes to think about it. No rush, this is a very chronic condition.   I confirm that I reviewed the patient's history and other pertinent data along with a member of our house staff. I confirmed the evaluation and/or treatment plan of house staff. Any exceptions as noted by the attending.  Creig Hines, MD

## 2021-11-08 DIAGNOSIS — R159 Full incontinence of feces: Secondary | ICD-10-CM | POA: Diagnosis not present

## 2021-11-11 NOTE — Telephone Encounter (Signed)
Appointment scheduled for 12-06-21 at 9:10am w/Dr Tarri Glenn.

## 2021-11-20 DIAGNOSIS — E1165 Type 2 diabetes mellitus with hyperglycemia: Secondary | ICD-10-CM | POA: Diagnosis not present

## 2021-11-27 ENCOUNTER — Inpatient Hospital Stay: Admit: 2021-11-27 | Payer: MEDICARE | Primary: Family Medicine

## 2021-11-27 DIAGNOSIS — Z01818 Encounter for other preprocedural examination: Secondary | ICD-10-CM

## 2021-11-27 DIAGNOSIS — E1142 Type 2 diabetes mellitus with diabetic polyneuropathy: Secondary | ICD-10-CM | POA: Diagnosis not present

## 2021-11-27 DIAGNOSIS — K219 Gastro-esophageal reflux disease without esophagitis: Secondary | ICD-10-CM | POA: Diagnosis not present

## 2021-11-27 DIAGNOSIS — Z794 Long term (current) use of insulin: Secondary | ICD-10-CM | POA: Diagnosis not present

## 2021-11-27 DIAGNOSIS — E119 Type 2 diabetes mellitus without complications: Secondary | ICD-10-CM | POA: Diagnosis not present

## 2021-11-27 DIAGNOSIS — I4589 Other specified conduction disorders: Secondary | ICD-10-CM | POA: Diagnosis not present

## 2021-11-27 DIAGNOSIS — E139 Other specified diabetes mellitus without complications: Secondary | ICD-10-CM | POA: Diagnosis not present

## 2021-11-27 DIAGNOSIS — E782 Mixed hyperlipidemia: Secondary | ICD-10-CM | POA: Diagnosis not present

## 2021-11-27 DIAGNOSIS — N181 Chronic kidney disease, stage 1: Secondary | ICD-10-CM | POA: Diagnosis not present

## 2021-11-27 DIAGNOSIS — E1122 Type 2 diabetes mellitus with diabetic chronic kidney disease: Secondary | ICD-10-CM | POA: Diagnosis not present

## 2021-11-27 DIAGNOSIS — Z7984 Long term (current) use of oral hypoglycemic drugs: Secondary | ICD-10-CM | POA: Diagnosis not present

## 2021-11-27 DIAGNOSIS — I10 Essential (primary) hypertension: Secondary | ICD-10-CM | POA: Diagnosis not present

## 2021-11-27 DIAGNOSIS — E1165 Type 2 diabetes mellitus with hyperglycemia: Secondary | ICD-10-CM | POA: Diagnosis not present

## 2021-11-27 LAB — CBC WITH AUTOMATED DIFF
ABS. BASOPHILS: 0.1 10*3/uL (ref 0.0–0.1)
ABS. EOSINOPHILS: 0.2 10*3/uL (ref 0.0–0.4)
ABS. IMM. GRANS.: 0 10*3/uL (ref 0.00–0.04)
ABS. LYMPHOCYTES: 3.4 10*3/uL (ref 0.8–3.5)
ABS. MONOCYTES: 0.6 10*3/uL (ref 0.0–1.0)
ABS. NEUTROPHILS: 3.7 10*3/uL (ref 1.8–8.0)
ABSOLUTE NRBC: 0 10*3/uL (ref 0.00–0.01)
BASOPHILS: 1 % (ref 0–1)
EOSINOPHILS: 2 % (ref 0–7)
HCT: 52.4 % — ABNORMAL HIGH (ref 36.6–50.3)
HGB: 16.4 g/dL (ref 12.1–17.0)
IMMATURE GRANULOCYTES: 0 % (ref 0.0–0.5)
LYMPHOCYTES: 43 % (ref 12–49)
MCH: 25.6 PG — ABNORMAL LOW (ref 26.0–34.0)
MCHC: 31.3 g/dL (ref 30.0–36.5)
MCV: 81.7 fL (ref 80.0–99.0)
MONOCYTES: 7 % (ref 5–13)
MPV: 11 fL (ref 8.9–12.9)
NEUTROPHILS: 47 % (ref 32–75)
NRBC: 0 /100{WBCs}
PLATELET: 237 10*3/uL (ref 150–400)
RBC: 6.41 M/uL — ABNORMAL HIGH (ref 4.10–5.70)
RDW: 15.5 % — ABNORMAL HIGH (ref 11.5–14.5)
WBC: 7.9 10*3/uL (ref 4.1–11.1)

## 2021-11-27 LAB — METABOLIC PANEL, BASIC
Anion gap: 6 mmol/L (ref 5–15)
BUN/Creatinine ratio: 18 (ref 12–20)
BUN: 16 mg/dL (ref 6–20)
CO2: 25 mmol/L (ref 21–32)
Calcium: 9.3 mg/dL (ref 8.5–10.1)
Chloride: 107 mmol/L (ref 97–108)
Creatinine: 0.9 mg/dL (ref 0.70–1.30)
Glucose: 69 mg/dL (ref 65–100)
Potassium: 4.3 mmol/L (ref 3.5–5.1)
Sodium: 138 mmol/L (ref 136–145)
eGFR: >60 mL/min/{1.73_m2} (ref >60)

## 2021-11-27 LAB — HEMOGLOBIN A1C WITH EAG
Est. average glucose: 128 mg/dL
Hemoglobin A1c: 6.1 % — ABNORMAL HIGH (ref 4.0–5.6)

## 2021-11-27 LAB — CBC WITH AUTO DIFFERENTIAL
Basophils %: 1 % (ref 0–1)
Basophils Absolute: 0.1 10*3/uL (ref 0.0–0.1)
Eosinophils %: 2 % (ref 0–7)
Eosinophils Absolute: 0.2 10*3/uL (ref 0.0–0.4)
Granulocyte Absolute Count: 0 10*3/uL (ref 0.00–0.04)
Hematocrit: 52.4 % — ABNORMAL HIGH (ref 36.6–50.3)
Hemoglobin: 16.4 g/dL (ref 12.1–17.0)
Immature Granulocytes %: 0 % (ref 0.0–0.5)
Lymphocytes %: 43 % (ref 12–49)
Lymphocytes Absolute: 3.4 10*3/uL (ref 0.8–3.5)
MCH: 25.6 PG — ABNORMAL LOW (ref 26.0–34.0)
MCHC: 31.3 g/dL (ref 30.0–36.5)
MCV: 81.7 FL (ref 80.0–99.0)
MPV: 11 FL (ref 8.9–12.9)
Monocytes %: 7 % (ref 5–13)
Monocytes Absolute: 0.6 10*3/uL (ref 0.0–1.0)
NRBC Absolute: 0 10*3/uL (ref 0.00–0.01)
Neutrophils %: 47 % (ref 32–75)
Neutrophils Absolute: 3.7 10*3/uL (ref 1.8–8.0)
Nucleated RBCs: 0 PER 100 WBC
Platelets: 237 10*3/uL (ref 150–400)
RBC: 6.41 M/uL — ABNORMAL HIGH (ref 4.10–5.70)
RDW: 15.5 % — ABNORMAL HIGH (ref 11.5–14.5)
WBC: 7.9 10*3/uL (ref 4.1–11.1)

## 2021-11-27 LAB — BASIC METABOLIC PANEL
Anion Gap: 6 mmol/L (ref 5–15)
BUN/Creatinine Ratio: 18 (ref 12–20)
BUN: 16 MG/DL (ref 6–20)
CO2: 25 mmol/L (ref 21–32)
Calcium: 9.3 MG/DL (ref 8.5–10.1)
Chloride: 107 mmol/L (ref 97–108)
Creatinine: 0.9 MG/DL (ref 0.70–1.30)
Est, Glom Filt Rate: >60 mL/min/{1.73_m2} (ref >60)
Glucose: 69 mg/dL (ref 65–100)
Potassium: 4.3 mmol/L (ref 3.5–5.1)
Sodium: 138 mmol/L (ref 136–145)

## 2021-11-27 LAB — HEMOGLOBIN A1C W/EAG
Estimated Avg Glucose: 128 mg/dL
Hemoglobin A1C: 6.1 % — ABNORMAL HIGH (ref 4.0–5.6)

## 2021-11-27 NOTE — Interval H&P Note (Signed)
EKG SENT TO ANESTHESIA FOR REVIEW.    LABS WERE FAXED TO SURGEON'S OFFICE.

## 2021-11-27 NOTE — Interval H&P Note (Signed)
 [facility]  Notes by Addie Dwayne SAUNDERS, RN at 11/27/21 1500                Author: Addie Dwayne SAUNDERS, RN  Service: --  Author Type: Registered Nurse       Filed: 11/27/21 1548  Date of Service: 11/27/21 1500  Status: Signed          Editor: Addie Dwayne SAUNDERS, RN (Registered Nurse)               [facility] PREOPERATIVE INSTRUCTIONS   ERAS      Surgery Date:   12/03/21      Your surgeon's office or [facility] staff will call you between 4 PM- 8 PM the day before surgery with your arrival time. If your surgery is on a Monday, you will receive a call the preceding Friday.      1.  Please report to [facility] Patient Access/Admitting on the 1st floor.  Bring your insurance card, photo identification, and  any copayment ( if applicable).    2.  If you are going home the same day of your surgery, you must have a responsible adult to drive you home. You need to have a responsible adult  to stay with you the first 24 hours after surgery and you should not drive a car for 24 hours following your surgery.   3.  If you are being admitted to the hospital, please leave personal belongings/luggage in your car until you have an assigned hospital room number.   4.  Please refer to ERAS book for Pre-operative Nutrition Plan.   5.  Do NOT drink alcohol or smoke 24 hours before surgery. STOP smoking for 14 days prior as it helps with breathing and healing after surgery.   6.  Please wear comfortable clothes. Wear your glasses instead of contacts. We ask that all money, jewelry and valuables be left at home. Wear no make up, particularly mascara, the day of surgery.    7.   All body piercings, rings, and jewelry need to be removed and left at home. Please remove any nail polish or artificial nails from your fingernails.  Please wear your hair loose or down. Please no pony-tails, buns, or any metal hair accessories. If you shower the morning of surgery, please do not apply any lotions or powders afterwards. You may wear deodorant.   Do not shave any body area within 24  hours of your surgery.   8.  Please follow all instructions to avoid any potential surgical cancellation.   9.  Should your physical condition change, (i.e. fever, cold, flu, etc.) please notify your surgeon as soon as possible.   10.  It is important to be on time. If a situation occurs where you may be delayed, please  call:  [phone] / [phone]  on the day of surgery.   11.  The Preadmission Testing staff can be reached at [phone].   12.  Special instructions: PLEASE REFER TO AND FOLLOW THE ERAS INSTRUCTIONS GIVEN TO  YOU IN YOUR ERAS BOOK FROM DR. GHAEMMAGHAMI AND FOLLOW THE INSTRUCTIONS FOR THE ERAS BAG OF DRINKS GIVEN TO YOU AT YOUR PAT APPT.  YOU ARE TO FOLLOW THE BOWEL PREP INSTRUCTIONS GIVEN TO YOU BY DR. AGUSTIN AS WELL.          Current Outpatient Medications        Medication  Sig         ?  FENUGREEK SEED PO  Take  by mouth daily.     ?  fluocinoNIDE (LIDEX) 0.05 % ointment  Apply  to affected area daily.     ?  gabapentin  (NEURONTIN ) 300 mg capsule  Take 300 mg by mouth two (2) times a day.     ?  ipratropium (ATROVENT ) 42 mcg (0.06 %) nasal spray  2 Sprays by Both Nostrils route two (2) times a day.     ?  cartilage/collagen/bor/hyalur (JOINT HEALTH PO)  Take  by mouth daily.     ?  labetaloL (NORMODYNE) 300 mg tablet  Take 300 mg by mouth two (2) times a day.     ?  lactulose (CHRONULAC) 10 gram/15 mL solution  Take 10 g by mouth daily.     ?  mesalamine (LIALDA) 1.2 gram delayed release tablet  Take  by mouth Daily (before breakfast).     ?  pantoprazole  (PROTONIX ) 40 mg tablet  Take 40 mg by mouth two (2) times a day.     ?  Lactobac no.41/Bifidobact no.7 (PROBIOTIC-10 PO)  Take  by mouth daily.     ?  rosuvastatin (CRESTOR) 10 mg tablet  Take 10 mg by mouth nightly.     ?  ferrous sulfate (SLOW FE PO)  Take  by mouth daily.     ?  TURMERIC PO  Take  by mouth daily.     ?  valACYclovir  (VALTREX ) 1 gram tablet  Take 1,000 mg by mouth  daily.     ?  cholecalciferol, vitamin D3, (Vitamin D3) 50 mcg (2,000 unit) tab  Take  by mouth daily.     ?  zolpidem  (AMBIEN ) 10 mg tablet  Take  by mouth nightly as needed for Sleep.     ?  metFORMIN  (GLUCOPHAGE ) 850 mg tablet  Take 850 mg by mouth two (2) times daily (with meals).     ?  empagliflozin  (Jardiance ) 25 mg tablet  Take 25 mg by mouth daily.     ?  lisinopriL  (PRINIVIL , ZESTRIL ) 40 mg tablet  Take 40 mg by mouth daily.     ?  insulin  glargine (Lantus  Solostar U-100 Insulin ) 100 unit/mL (3 mL) inpn  52 Units by SubCUTAneous route nightly.     ?  insulin  aspart U-100 (NovoLOG Flexpen U-100 Insulin ) 100 unit/mL (3 mL) inpn  by SubCUTAneous route Before breakfast, lunch, and dinner. 16 UNITS AT BREAKFAST, 25 UNITS AT LUNCH, 30 UNITS WITH SUPPER         ?  amLODIPine  (Norvasc ) 10 mg tablet  Take 10 mg by mouth every evening.          No current facility-administered medications for this encounter.           1.  YOU MUST ONLY TAKE THESE  MEDICATIONS THE MORNING OF SURGERY WITH A SIP OF WATER : GABAPENTIN , LABETALOL, PANTOPRAZOLE ,    2.  MEDICATIONS TO TAKE THE MORNING OF SURGERY ONLY IF NEEDED: NASAL SPRAY IF NEEDED, VALACYCLOVIR   IF NEEDED   3.  HOLD these prescription medications BEFORE  surgery: HOLD YOUR METFORMIN  FOR 24HRS PRIOR TO SURGERY (DO NOT TAKE THE DAY BEFORE SURGERY OR THE MORNING OF)   4.  Ask your surgeon/prescribing physician about when/if to STOP taking these medications:  VERIFY YOUR NIGHTLY INSULIN  DOSE WITH PRESCRIBER THE NIGHT BEFORE SURGERY.   5.  Stop all vitamins, herbal medicines and Aspirin containing products 7 days prior to surgery. Stop any non-steroidal anti-inflammatory drugs (i.e.  Ibuprofen, Naproxen, Advil, Aleve) 3 days before surgery.  You may take Tylenol .     6.  If you are currently taking Aspirin, Plavix, Coumadin or any other blood-thinning/anticoagulant medication contact your prescribing physician  for instructions.         Eating and Drinking Before Surgery         You may eat a regular dinner at the usual time on the day before your surgery. If your surgery requires a bowel prep, follow prep instructions  given to you by your surgeon.     Do NOT eat any solid foods after midnight unless your arrival time at the hospital is  3pm or later.     You may drink clear liquids only from 12 midnight until 1 hour prior to your arrival time at the hospital on the day of your surgery. Do NOT drink  alcohol.     Clear liquids include:   o  Water    o  Fruit juices without pulp( i.e. apple juice)   o  Carbonated beverages   o  Black coffee (no cream/milk)   o  Tea (no cream/milk)   o  Gatorade     You may drink up to 12-16 ounces at one time  every 4 hours between the hours of midnight and 1 hour before your arrival time at the hospital. Example- if your arrival time at the hospital is 6am, you may drink 12-16 ounces of clear liquids no later than 5am.     If your arrival time at the hospital is 3pm or later, you may eat a light breakfast before  8am.     A light breakfast includes:   o  Toast or bagel (no butter)   o  Black coffee (no cream/milk)   o  Tea (no cream/milk)   o  Fruit juices without pulp ( i.e. apple juice)   o  Do NOT eat meat, eggs, vegetables or fruit     If you have any questions, please contact your surgeon's office.          Incentive Spirometer              Using the incentive spirometer helps expand the small air sacs of your lungs, helps you breathe deeply, and helps improve your lung function.   Use your incentive spirometer twice a day (10 breaths each time) prior to surgery.        How to Use Your Incentive Spirometer:   1.  Hold the incentive spirometer in an upright position.    2.  Breathe out as usual.    3.  Place the mouthpiece in your mouth and seal your lips tightly around it.    4.  Take a deep breath.  Breathe in slowly and as deeply as possible. Keep the blue flow rate guide between the arrows.    5.  Hold your breath as long as possible. Then  exhale slowly and allow the piston to fall to the bottom of the column.    6.  Rest for a few seconds and repeat steps one through five at least 10 times.       Opportunity given to ask and answer questions.         Preventing Infections Before and After - Your Surgery      IMPORTANT INSTRUCTIONS      You play an important role in your health and preparation for surgery. To reduce the germs on your skin  you will need to shower with CHG soap (Chorhexidine gluconate 4%) two times before surgery.      CHG soap (Hibiclens, Hex-A-Clens or store brand)     CHG soap will be provided at your Preadmission Testing (PAT) appointment.     If you do not have a PAT appointment before surgery, you may arrange to pick up CHG soap from our office or purchase CHG soap at a pharmacy, grocery or department store.     You need to purchase TWO 4 ounce bottles to use for your 2 showers.      Steps to follow:   1.  Wash your hair with your normal shampoo and your body with regular soap and rinse well to remove shampoo and soap from your skin.   2.  Wet a clean washcloth and turn off the shower.   3.  Put CHG soap on washcloth and apply to your entire body from the neck down. Do not use on your head, face or private parts(genitals). Do not use CHG soap on open sores, wounds or areas of skin irritation.   4.  Wash you body gently for 5 minutes. Do not wash your skin too hard. This soap does not create lather. Pay special attention to your underarms and from your belly button to your feet.   5.  Turn the shower back on and rinse well to get CHG soap off your body.   6.  Pat your skin dry with a clean, dry towel. Do not apply lotions or moisturizer.   7.  Put on clean clothes and sleep on fresh bed sheets and do not allow pets to sleep with you.      Shower with CHG soap 2 times before your surgery     The evening before your surgery     The morning of your surgery         Tips to help prevent infections after your surgery:   1.  Protect  your surgical wound from germs:     Hand washing is the most important thing you and your caregivers can do to prevent infections.     Keep your bandage clean and dry!     Do not touch your surgical wound.   2.  Use clean, freshly washed towels and washcloths every time you shower; do not share bath linens with others.   3.  Until your surgical wound is healed, wear clothing and sleep on bed linens each day that are clean and freshly washed.   4.  Do not allow pets to sleep in your bed with you or touch your surgical wound.   5.  Do not smoke - smoking delays wound healing. This may be a good time to stop smoking.   6.  If you have diabetes, it is important for you to manage your blood sugar levels properly before your surgery as well as after your surgery. Poorly managed blood sugar levels slow down wound healing and prevent you from healing completely.               Patient Information Regarding COVID Restrictions      Day of Procedure        Please park in the parking deck or any designated visitor parking lot.     Enter the facility through the Main Entrance of the hospital.     On the day of surgery, please provide the cell phone number of the person who will be waiting  for you to the Patient Access representative at the time of registration.     Masks are highly recommended in the hospital, but not required.     Once your procedure and the immediate recovery period is completed, a nurse in the recovery area will contact your designated visitor to inform them of your room number or  to otherwise review other pertinent information regarding your care.        Social distancing practices are strongly encouraged in waiting areas and the cafeteria.           The patient was contacted in person.    He verbalized understanding of all instructions does not  need reinforcement.

## 2021-11-27 NOTE — Interval H&P Note (Signed)
NO ORDERS FROM DR. GHAEMMAGHAMI IN CC.  SPOKE TO Doneen Poisson, NP AND LABS AND EKG DONE BASED ON ANESTHESIA PROTOCOL.    PT HAS ERAS BOOK FROM DR. GHAEMMAGHAMI'S OFFICE.  PT STATES HE HAS ALREADY PICKED UP HIS PRESCRIBED BOWEL PREP SOLUTION AND THE ANTIBIOTICS THAT WERE GIVEN TO HIM.  PT VERBALIZED UNDERSTANDING OF THE BOWEL PREP INSTRUCTIONS.    ERAS BAG OF DRINKS FOR DIABETICS GIVEN TO PT.  THIS NUTRITION PLAN WAS REVIEWED WITH PT AND SISTER AND BOTH VERBALIZED UNDERSTANDING.     INSTRUCTIONS GIVEN TO PT ON USE OF INCENTIVE SPIROMETER AND PT DEMONSTRATED USE CORRECTLY.    ML ON VM FOR THE STOMA RN AT [facility]/EXT. 1610 AND ADVISED THEM OF PT'S UPCOMING SURGERY ON 12/03/21.      PAT Nutrition Screen    1. Has the patient had an unplanned weight loss of 10% of body weight or more in the last 6 months? No = 0   2. Has the patient been eating less than 50% of their normal diet in the preceding week? No = 0       Patient instructed on Diabetic pre-operative nutrition plan to start 5 days prior to surgery. Patient given 10 shakes and 1 pre-surgery drinks. Opportunity given for questions and all questions answered.

## 2021-11-27 NOTE — Interval H&P Note (Signed)
Preoperative Nutrition Screen (PONS)   Patient's Age: 65 y.o.    Patient's BMI: Estimated body mass index is 31.7 kg/m?? as calculated from the following:    Height as of this encounter: 5\' 10"  (1.778 m).    Weight as of this encounter: 100.2 kg (220 lb 14.4 oz).     If the answer to any of the following is Yes, then recommend prescribe Oral Nutrition Supplements (ONS) for at least 7 days prior to surgery and/or order referral to dietitian for further assessment and nutrition therapy.    1. Does the patient have a documented serum albumin less than 3.0 within the last 90 days?      Unknown = 0     2. Is patient's BMI less than 18.5 (or less than 20 if age over 7)?  No = 0      3. Has the patient had an unplanned weight loss of 10% of body weight or more in the last 6 months? No = 0   4. Has the patient been eating less than 50% of their normal diet in the preceding week? No = 0   PONS Score (number of Yes responses), 0-4 0     Plan:   2 daily immuno nutrition shakes, 5 days prior to surgery and 5 days post-operatively.  1 pre-surgery drink.     Electronically signed by 75, NP on 11/29/21 at 1:52 PM

## 2021-11-27 NOTE — Interval H&P Note (Signed)
EKG OK PER ANESTHESIA DR. BANGURA.

## 2021-11-28 LAB — EKG, 12 LEAD, INITIAL
Atrial Rate: 60 {beats}/min
Calculated P Axis: 42 degrees
Calculated R Axis: 73 degrees
Calculated T Axis: 109 degrees
Diagnosis: NORMAL
P-R Interval: 168 ms
Q-T Interval: 414 ms
QRS Duration: 120 ms
QTC Calculation (Bezet): 414 ms
Ventricular Rate: 60 {beats}/min

## 2021-11-28 LAB — EKG 12-LEAD
Atrial Rate: 60 {beats}/min
Diagnosis: NORMAL
P Axis: 42 degrees
P-R Interval: 168 ms
Q-T Interval: 414 ms
QRS Duration: 120 ms
QTc Calculation (Bazett): 414 ms
R Axis: 73 degrees
T Axis: 109 degrees
Ventricular Rate: 60 {beats}/min

## 2021-12-03 ENCOUNTER — Inpatient Hospital Stay
Admit: 2021-12-03 | Discharge: 2021-12-10 | Disposition: A | Discharge status: Home / Self Care | Payer: MEDICARE | Attending: Colon & Rectal Surgery | Admitting: Colon & Rectal Surgery

## 2021-12-03 DIAGNOSIS — R159 Full incontinence of feces: Secondary | ICD-10-CM

## 2021-12-03 DIAGNOSIS — E1165 Type 2 diabetes mellitus with hyperglycemia: Secondary | ICD-10-CM

## 2021-12-03 DIAGNOSIS — Z794 Long term (current) use of insulin: Secondary | ICD-10-CM

## 2021-12-03 DIAGNOSIS — E1121 Type 2 diabetes mellitus with diabetic nephropathy: Secondary | ICD-10-CM | POA: Diagnosis not present

## 2021-12-03 DIAGNOSIS — I10 Essential (primary) hypertension: Secondary | ICD-10-CM | POA: Diagnosis not present

## 2021-12-03 DIAGNOSIS — E119 Type 2 diabetes mellitus without complications: Secondary | ICD-10-CM | POA: Diagnosis not present

## 2021-12-03 DIAGNOSIS — E785 Hyperlipidemia, unspecified: Secondary | ICD-10-CM | POA: Diagnosis not present

## 2021-12-03 DIAGNOSIS — E6609 Other obesity due to excess calories: Secondary | ICD-10-CM | POA: Diagnosis not present

## 2021-12-03 DIAGNOSIS — N529 Male erectile dysfunction, unspecified: Secondary | ICD-10-CM | POA: Diagnosis not present

## 2021-12-03 DIAGNOSIS — I129 Hypertensive chronic kidney disease with stage 1 through stage 4 chronic kidney disease, or unspecified chronic kidney disease: Secondary | ICD-10-CM | POA: Diagnosis not present

## 2021-12-03 DIAGNOSIS — R32 Unspecified urinary incontinence: Secondary | ICD-10-CM | POA: Diagnosis not present

## 2021-12-03 DIAGNOSIS — N181 Chronic kidney disease, stage 1: Secondary | ICD-10-CM | POA: Diagnosis not present

## 2021-12-03 DIAGNOSIS — Z6832 Body mass index (BMI) 32.0-32.9, adult: Secondary | ICD-10-CM | POA: Diagnosis not present

## 2021-12-03 DIAGNOSIS — Z87891 Personal history of nicotine dependence: Secondary | ICD-10-CM | POA: Diagnosis not present

## 2021-12-03 DIAGNOSIS — K219 Gastro-esophageal reflux disease without esophagitis: Secondary | ICD-10-CM | POA: Diagnosis not present

## 2021-12-03 DIAGNOSIS — N35919 Unspecified urethral stricture, male, unspecified site: Secondary | ICD-10-CM | POA: Diagnosis not present

## 2021-12-03 DIAGNOSIS — E1142 Type 2 diabetes mellitus with diabetic polyneuropathy: Secondary | ICD-10-CM | POA: Diagnosis not present

## 2021-12-03 DIAGNOSIS — E1122 Type 2 diabetes mellitus with diabetic chronic kidney disease: Secondary | ICD-10-CM | POA: Diagnosis not present

## 2021-12-03 DIAGNOSIS — Z7984 Long term (current) use of oral hypoglycemic drugs: Secondary | ICD-10-CM | POA: Diagnosis not present

## 2021-12-03 DIAGNOSIS — Z933 Colostomy status: Secondary | ICD-10-CM | POA: Diagnosis not present

## 2021-12-03 LAB — METABOLIC PANEL, BASIC
Anion gap: 6 mmol/L (ref 5–15)
BUN/Creatinine ratio: 16 (ref 12–20)
BUN: 17 MG/DL (ref 6–20)
CO2: 24 mmol/L (ref 21–32)
Calcium: 8.5 MG/DL (ref 8.5–10.1)
Chloride: 108 mmol/L (ref 97–108)
Creatinine: 1.07 MG/DL (ref 0.70–1.30)
Glucose: 157 mg/dL — ABNORMAL HIGH (ref 65–100)
Potassium: 4.4 mmol/L (ref 3.5–5.1)
Sodium: 138 mmol/L (ref 136–145)
eGFR: >60 mL/min/{1.73_m2} (ref >60)

## 2021-12-03 LAB — CBC W/O DIFF
ABSOLUTE NRBC: 0 10*3/uL (ref 0.00–0.01)
HCT: 46.2 % (ref 36.6–50.3)
HGB: 14.8 g/dL (ref 12.1–17.0)
MCH: 26.2 PG (ref 26.0–34.0)
MCHC: 32 g/dL (ref 30.0–36.5)
MCV: 81.8 FL (ref 80.0–99.0)
MPV: 10.3 FL (ref 8.9–12.9)
NRBC: 0 PER 100 WBC
PLATELET: 186 10*3/uL (ref 150–400)
RBC: 5.65 M/uL (ref 4.10–5.70)
RDW: 14.6 % — ABNORMAL HIGH (ref 11.5–14.5)
WBC: 9.6 10*3/uL (ref 4.1–11.1)

## 2021-12-03 LAB — GLUCOSE, POC
Glucose (POC): 275 mg/dL — ABNORMAL HIGH (ref 65–117)
Glucose (POC): 104 mg/dL (ref 65–117)
Glucose (POC): 119 mg/dL — ABNORMAL HIGH (ref 65–117)

## 2021-12-03 LAB — PHOSPHORUS
Phosphorus: 3.9 MG/DL (ref 2.6–4.7)
Phosphorus: 3.9 MG/DL (ref 2.6–4.7)

## 2021-12-03 LAB — MAGNESIUM
Magnesium: 2.5 mg/dL — ABNORMAL HIGH (ref 1.6–2.4)
Magnesium: 2.5 mg/dL — ABNORMAL HIGH (ref 1.6–2.4)

## 2021-12-03 LAB — TYPE & SCREEN
ABO/Rh(D): B POS
Antibody screen: NEGATIVE

## 2021-12-03 LAB — TYPE AND SCREEN
ABO/Rh: B POS
Antibody Screen: NEGATIVE

## 2021-12-03 LAB — BASIC METABOLIC PANEL
Anion Gap: 6 mmol/L (ref 5–15)
BUN/Creatinine Ratio: 16 (ref 12–20)
BUN: 17 MG/DL (ref 6–20)
CO2: 24 mmol/L (ref 21–32)
Calcium: 8.5 MG/DL (ref 8.5–10.1)
Chloride: 108 mmol/L (ref 97–108)
Creatinine: 1.07 MG/DL (ref 0.70–1.30)
Est, Glom Filt Rate: >60 mL/min/{1.73_m2} (ref >60)
Glucose: 157 mg/dL — ABNORMAL HIGH (ref 65–100)
Potassium: 4.4 mmol/L (ref 3.5–5.1)
Sodium: 138 mmol/L (ref 136–145)

## 2021-12-03 LAB — CBC
Hematocrit: 46.2 % (ref 36.6–50.3)
Hemoglobin: 14.8 g/dL (ref 12.1–17.0)
MCH: 26.2 PG (ref 26.0–34.0)
MCHC: 32 g/dL (ref 30.0–36.5)
MCV: 81.8 FL (ref 80.0–99.0)
MPV: 10.3 FL (ref 8.9–12.9)
NRBC Absolute: 0 10*3/uL (ref 0.00–0.01)
Nucleated RBCs: 0 PER 100 WBC
Platelets: 186 10*3/uL (ref 150–400)
RBC: 5.65 M/uL (ref 4.10–5.70)
RDW: 14.6 % — ABNORMAL HIGH (ref 11.5–14.5)
WBC: 9.6 10*3/uL (ref 4.1–11.1)

## 2021-12-03 LAB — POCT GLUCOSE
POC Glucose: 275 mg/dL — ABNORMAL HIGH (ref 65–117)
POC Glucose: 104 mg/dL (ref 65–117)
POC Glucose: 119 mg/dL — ABNORMAL HIGH (ref 65–117)

## 2021-12-03 MED ORDER — PHENYLEPHRINE IN 0.9 % SODIUM CL (40 MCG/ML) IV SYRINGE
0.4 mg/10 mL (40 mcg/mL) | INTRAVENOUS | Status: DC | PRN
Start: 2021-12-03 — End: 2021-12-03
  Administered 2021-12-03: 18:00:00 via INTRAVENOUS

## 2021-12-03 MED ORDER — LACTATED RINGERS IV
INTRAVENOUS | Status: DC
Start: 2021-12-03 — End: 2021-12-03
  Administered 2021-12-03 (×2): via INTRAVENOUS

## 2021-12-03 MED ORDER — FENTANYL CITRATE (PF) 50 MCG/ML IJ SOLN
50 mcg/mL | INTRAMUSCULAR | Status: AC
Start: 2021-12-03 — End: 2021-12-04
  Administered 2021-12-04: via INTRAVENOUS

## 2021-12-03 MED ORDER — CALCIUM GLUCONATE 100 MG/ML (10%) IV SOLN
100 mg/mL (10%) | INTRAVENOUS | Status: AC
Start: 2021-12-03 — End: ?

## 2021-12-03 MED ORDER — SODIUM CHLORIDE 0.9 % IJ SYRG
Freq: Three times a day (TID) | INTRAMUSCULAR | Status: DC
Start: 2021-12-03 — End: 2021-12-03
  Administered 2021-12-03: 19:00:00 via INTRAVENOUS

## 2021-12-03 MED ORDER — ALBUMIN, HUMAN 5 % IV
5 % | INTRAVENOUS | Status: DC | PRN
Start: 2021-12-03 — End: 2021-12-03
  Administered 2021-12-03: 20:00:00 via INTRAVENOUS

## 2021-12-03 MED ORDER — FENTANYL CITRATE (PF) 50 MCG/ML IJ SOLN
50 mcg/mL | Freq: Once | INTRAMUSCULAR | Status: AC | PRN
Start: 2021-12-03 — End: 2021-12-03
  Administered 2021-12-03: 23:00:00 via INTRAVENOUS

## 2021-12-03 MED ORDER — FENTANYL CITRATE (PF) 50 MCG/ML IJ SOLN
50 mcg/mL | INTRAMUSCULAR | Status: AC
Start: 2021-12-03 — End: ?

## 2021-12-03 MED ORDER — MEPERIDINE (PF) 25 MG/ML INJ SOLUTION
25 mg/ml | Freq: Once | INTRAMUSCULAR | Status: AC
Start: 2021-12-03 — End: 2021-12-03
  Administered 2021-12-04: via INTRAVENOUS

## 2021-12-03 MED ORDER — CELECOXIB 100 MG CAP
100 mg | Freq: Once | ORAL | Status: AC
Start: 2021-12-03 — End: 2021-12-03
  Administered 2021-12-03: 16:00:00 via ORAL

## 2021-12-03 MED ORDER — PROPOFOL 10 MG/ML IV EMUL
10 mg/mL | INTRAVENOUS | Status: DC | PRN
Start: 2021-12-03 — End: 2021-12-03
  Administered 2021-12-03: 18:00:00 via INTRAVENOUS

## 2021-12-03 MED ORDER — ATROPINE 1 MG/2.5 ML (0.4 MG/ML) IN 0.9 % SODIUM CHLORIDE IV SYRINGE
1 mg/2.5 mL (0.4 mg/mL) | INTRAVENOUS | Status: AC
Start: 2021-12-03 — End: ?

## 2021-12-03 MED ORDER — PHENYLEPHRINE 10 MG/ML INJECTION
10 mg/mL | INTRAMUSCULAR | Status: DC | PRN
Start: 2021-12-03 — End: 2021-12-03
  Administered 2021-12-03 (×7): via INTRAVENOUS

## 2021-12-03 MED ORDER — LIDOCAINE (PF) 20 MG/ML (2 %) IJ SOLN
20 mg/mL (2 %) | INTRAMUSCULAR | Status: AC
Start: 2021-12-03 — End: ?

## 2021-12-03 MED ORDER — MAGNESIUM SULFATE 2 GRAM/50 ML IVPB
2 gram/50 mL (4 %) | INTRAVENOUS | Status: DC | PRN
Start: 2021-12-03 — End: 2021-12-03
  Administered 2021-12-03: 18:00:00 via INTRAVENOUS

## 2021-12-03 MED ORDER — HYDRALAZINE 20 MG/ML IJ SOLN
20 mg/mL | INTRAMUSCULAR | Status: AC
Start: 2021-12-03 — End: ?

## 2021-12-03 MED ORDER — MAGNESIUM SULFATE 2 GRAM/50 ML IVPB
2 gram/50 mL (4 %) | INTRAVENOUS | Status: AC
Start: 2021-12-03 — End: ?

## 2021-12-03 MED ORDER — MIDAZOLAM 1 MG/ML IJ SOLN
1 mg/mL | INTRAMUSCULAR | Status: DC | PRN
Start: 2021-12-03 — End: 2021-12-03

## 2021-12-03 MED ORDER — MIDAZOLAM 1 MG/ML IJ SOLN
1 mg/mL | INTRAMUSCULAR | Status: DC | PRN
Start: 2021-12-03 — End: 2021-12-03
  Administered 2021-12-03: 18:00:00 via INTRAVENOUS

## 2021-12-03 MED ORDER — ONDANSETRON (PF) 4 MG/2 ML INJECTION
4 mg/2 mL | INTRAMUSCULAR | Status: AC
Start: 2021-12-03 — End: ?

## 2021-12-03 MED ORDER — ONDANSETRON (PF) 4 MG/2 ML INJECTION
4 mg/2 mL | INTRAMUSCULAR | Status: DC | PRN
Start: 2021-12-03 — End: 2021-12-03
  Administered 2021-12-03 (×2): via INTRAVENOUS

## 2021-12-03 MED ORDER — BUPIVACAINE-EPINEPHRINE (PF) 0.5 %-1:200,000 IJ SOLN
0.5 %-1:200,000 | Freq: Once | INTRAMUSCULAR | Status: AC
Start: 2021-12-03 — End: 2021-12-03
  Administered 2021-12-03 (×2): via SUBCUTANEOUS

## 2021-12-03 MED ORDER — HYDROMORPHONE 2 MG/ML INJECTION SOLUTION
2 mg/mL | INTRAMUSCULAR | Status: AC
Start: 2021-12-03 — End: ?

## 2021-12-03 MED ORDER — SODIUM CHLORIDE 0.9 % IJ SYRG
INTRAMUSCULAR | Status: DC | PRN
Start: 2021-12-03 — End: 2021-12-03

## 2021-12-03 MED ORDER — INSULIN REGULAR HUMAN 100 UNIT/ML INJECTION
100 unit/mL | INTRAMUSCULAR | Status: AC
Start: 2021-12-03 — End: ?

## 2021-12-03 MED ORDER — LIDOCAINE 8 MG/ML INFUSION
8 mg/mL (0. %) | INTRAVENOUS | Status: AC
Start: 2021-12-03 — End: ?

## 2021-12-03 MED ORDER — DEXMEDETOMIDINE 100 MCG/ML IV SOLN
100 mcg/mL | INTRAVENOUS | Status: AC
Start: 2021-12-03 — End: ?

## 2021-12-03 MED ORDER — ROCURONIUM 10 MG/ML IV
10 mg/mL | INTRAVENOUS | Status: DC | PRN
Start: 2021-12-03 — End: 2021-12-03
  Administered 2021-12-03 (×7): via INTRAVENOUS

## 2021-12-03 MED ORDER — KETAMINE 10 MG/ML IJ SOLN
10 mg/mL | INTRAMUSCULAR | Status: DC | PRN
Start: 2021-12-03 — End: 2021-12-03
  Administered 2021-12-03 (×2): via INTRAVENOUS

## 2021-12-03 MED ORDER — FENTANYL CITRATE (PF) 50 MCG/ML IJ SOLN
50 mcg/mL | INTRAMUSCULAR | Status: DC | PRN
Start: 2021-12-03 — End: 2021-12-03
  Administered 2021-12-03: 18:00:00 via INTRAVENOUS

## 2021-12-03 MED ORDER — LIDOCAINE 8 MG/ML INFUSION
8 mg/mL (0. %) | INTRAVENOUS | Status: DC | PRN
Start: 2021-12-03 — End: 2021-12-03
  Administered 2021-12-03: 18:00:00 via INTRAVENOUS

## 2021-12-03 MED ORDER — LIDOCAINE (PF) 20 MG/ML (2 %) IJ SOLN
20 mg/mL (2 %) | INTRAMUSCULAR | Status: DC | PRN
Start: 2021-12-03 — End: 2021-12-03
  Administered 2021-12-03: 18:00:00 via INTRAVENOUS

## 2021-12-03 MED ORDER — CELECOXIB 100 MG CAP
100 mg | Freq: Two times a day (BID) | ORAL | Status: DC
Start: 2021-12-03 — End: 2021-12-03

## 2021-12-03 MED ORDER — DEXAMETHASONE SODIUM PHOSPHATE 4 MG/ML IJ SOLN
4 mg/mL | INTRAMUSCULAR | Status: AC
Start: 2021-12-03 — End: ?

## 2021-12-03 MED ORDER — ACETAMINOPHEN 500 MG TAB
500 mg | Freq: Once | ORAL | Status: AC
Start: 2021-12-03 — End: 2021-12-03
  Administered 2021-12-03: 16:00:00 via ORAL

## 2021-12-03 MED ORDER — NALOXEGOL 25 MG TABLET
25 mg | Freq: Once | ORAL | Status: AC
Start: 2021-12-03 — End: 2021-12-03
  Administered 2021-12-03: 16:00:00 via ORAL

## 2021-12-03 MED ORDER — SUCCINYLCHOLINE CHLORIDE 200 MG/10 ML (20 MG/ML) INTRAVENOUS SYRINGE
200 mg/10 mL (20 mg/mL) | INTRAVENOUS | Status: AC
Start: 2021-12-03 — End: ?

## 2021-12-03 MED ORDER — WATER FOR INJECTION, STERILE INJECTION
1 gram | Freq: Once | INTRAMUSCULAR | Status: AC
Start: 2021-12-03 — End: 2021-12-03
  Administered 2021-12-03 (×2): via INTRAVENOUS

## 2021-12-03 MED ORDER — GLYCOPYRROLATE 0.2 MG/ML IJ SOLN
0.2 mg/mL | INTRAMUSCULAR | Status: DC | PRN
Start: 2021-12-03 — End: 2021-12-03
  Administered 2021-12-03: 23:00:00 via INTRAVENOUS

## 2021-12-03 MED ORDER — MIDAZOLAM 1 MG/ML IJ SOLN
1 mg/mL | INTRAMUSCULAR | Status: AC
Start: 2021-12-03 — End: ?

## 2021-12-03 MED ORDER — NEOSTIGMINE METHYLSULFATE 3 MG/3 ML (1 MG/ML) IV SYRINGE
3 mg/ mL (1 mg/mL) | INTRAVENOUS | Status: DC | PRN
Start: 2021-12-03 — End: 2021-12-03
  Administered 2021-12-03: 23:00:00 via INTRAVENOUS

## 2021-12-03 MED ORDER — CEFAZOLIN 1 GRAM SOLUTION FOR INJECTION
1 gram | INTRAMUSCULAR | Status: AC
Start: 2021-12-03 — End: ?

## 2021-12-03 MED ORDER — ROCURONIUM 10 MG/ML IV
10 mg/mL | INTRAVENOUS | Status: AC
Start: 2021-12-03 — End: ?

## 2021-12-03 MED ORDER — PROPOFOL 10 MG/ML IV EMUL
10 mg/mL | INTRAVENOUS | Status: AC
Start: 2021-12-03 — End: ?

## 2021-12-03 MED ORDER — CALCIUM GLUCONATE 100 MG/ML (10%) IV SOLN
100 mg/mL (10%) | INTRAVENOUS | Status: DC | PRN
Start: 2021-12-03 — End: 2021-12-03
  Administered 2021-12-03 (×4): via INTRAVENOUS

## 2021-12-03 MED ORDER — ATROPINE 0.4 MG/ML IJ SOLN
0.4 mg/mL | INTRAMUSCULAR | Status: DC | PRN
Start: 2021-12-03 — End: 2021-12-03
  Administered 2021-12-03: 20:00:00 via INTRAVENOUS

## 2021-12-03 MED ORDER — KETAMINE 50 MG/5 ML (10 MG/ML) IN NS IV SYRINGE
50 mg/5 mL (10 mg/mL) | INTRAVENOUS | Status: AC
Start: 2021-12-03 — End: ?

## 2021-12-03 MED ORDER — DEXAMETHASONE SODIUM PHOSPHATE 4 MG/ML IJ SOLN
4 mg/mL | INTRAMUSCULAR | Status: DC | PRN
Start: 2021-12-03 — End: 2021-12-03
  Administered 2021-12-03: 18:00:00 via INTRAVENOUS

## 2021-12-03 MED ORDER — EPHEDRINE (PF) 50 MG/5 ML (10 MG/ML) IN NS IV SYRINGE
50510 mg/5 mL (10 mg/mL) | INTRAVENOUS | Status: DC | PRN
Start: 2021-12-03 — End: 2021-12-03
  Administered 2021-12-03 (×4): via INTRAVENOUS

## 2021-12-03 MED ORDER — ONDANSETRON (PF) 4 MG/2 ML INJECTION
4 mg/2 mL | INTRAMUSCULAR | Status: AC | PRN
Start: 2021-12-03 — End: 2021-12-04

## 2021-12-03 MED ORDER — PHENYLEPHRINE 10 MG/ML INJECTION
10 mg/mL | INTRAMUSCULAR | Status: AC
Start: 2021-12-03 — End: ?

## 2021-12-03 MED ORDER — GLYCOPYRROLATE(PF) 0.4 MG/2 ML (0.2 MG/ML) IN STERILE WATER IV SYRINGE
0.4 mg/2 mL (0.2 mg/mL) | INTRAVENOUS | Status: AC
Start: 2021-12-03 — End: ?

## 2021-12-03 MED ORDER — SUCCINYLCHOLINE CHLORIDE 20 MG/ML INJECTION
20 mg/mL | INTRAMUSCULAR | Status: DC | PRN
Start: 2021-12-03 — End: 2021-12-03
  Administered 2021-12-03 (×2): via INTRAVENOUS

## 2021-12-03 MED ORDER — METRONIDAZOLE IN SODIUM CHLORIDE (ISO-OSM) 500 MG/100 ML IV PIGGY BACK
500 mg/100 mL | Freq: Once | INTRAVENOUS | Status: AC
Start: 2021-12-03 — End: 2021-12-03
  Administered 2021-12-03: 18:00:00 via INTRAVENOUS

## 2021-12-03 MED ORDER — NEOSTIGMINE METHYLSULFATE 3 MG/3 ML (1 MG/ML) IV SYRINGE
3 mg/ mL (1 mg/mL) | INTRAVENOUS | Status: AC
Start: 2021-12-03 — End: ?

## 2021-12-03 MED ORDER — WATER FOR INJECTION, STERILE INJECTION
INTRAMUSCULAR | Status: AC
Start: 2021-12-03 — End: ?

## 2021-12-03 MED FILL — MAGNESIUM SULFATE 2 GRAM/50 ML IVPB: 2 gram/50 mL (4 %) | INTRAVENOUS | Qty: 50

## 2021-12-03 MED FILL — SENSORCAINE-MPF/EPINEPHRINE 0.5 %-1:200,000 INJECTION SOLUTION: 0.5 %-1:200,000 | INTRAMUSCULAR | Qty: 30

## 2021-12-03 MED FILL — DIPRIVAN 10 MG/ML INTRAVENOUS EMULSION: 10 mg/mL | INTRAVENOUS | Qty: 100

## 2021-12-03 MED FILL — HYDRALAZINE 20 MG/ML IJ SOLN: 20 mg/mL | INTRAMUSCULAR | Qty: 1

## 2021-12-03 MED FILL — METRO I.V. 500 MG/100 ML INTRAVENOUS PIGGYBACK: 500 mg/100 mL | INTRAVENOUS | Qty: 100

## 2021-12-03 MED FILL — ONDANSETRON (PF) 4 MG/2 ML INJECTION: 4 mg/2 mL | INTRAMUSCULAR | Qty: 4

## 2021-12-03 MED FILL — INSULIN REGULAR HUMAN 100 UNIT/ML INJECTION: 100 unit/mL | INTRAMUSCULAR | Qty: 1

## 2021-12-03 MED FILL — LIDOCAINE (PF) 20 MG/ML (2 %) IJ SOLN: 20 mg/mL (2 %) | INTRAMUSCULAR | Qty: 10

## 2021-12-03 MED FILL — CEFAZOLIN 1 GRAM SOLUTION FOR INJECTION: 1 gram | INTRAMUSCULAR | Qty: 2000

## 2021-12-03 MED FILL — FENTANYL CITRATE (PF) 50 MCG/ML IJ SOLN: 50 mcg/mL | INTRAMUSCULAR | Qty: 1

## 2021-12-03 MED FILL — CALCIUM GLUCONATE 100 MG/ML (10%) IV SOLN: 100 mg/mL (10%) | INTRAVENOUS | Qty: 10

## 2021-12-03 MED FILL — ATROPINE 1 MG/2.5 ML (0.4 MG/ML) IN 0.9 % SODIUM CHLORIDE IV SYRINGE: 1 mg/2.5 mL (0.4 mg/mL) | INTRAVENOUS | Qty: 2.5

## 2021-12-03 MED FILL — ROCURONIUM 10 MG/ML IV: 10 mg/mL | INTRAVENOUS | Qty: 15

## 2021-12-03 MED FILL — DIPRIVAN 10 MG/ML INTRAVENOUS EMULSION: 10 mg/mL | INTRAVENOUS | Qty: 60

## 2021-12-03 MED FILL — LIDOCAINE 8 MG/ML INFUSION: 8 mg/mL (0. %) | INTRAVENOUS | Qty: 250

## 2021-12-03 MED FILL — NEOSTIGMINE METHYLSULFATE 3 MG/3 ML (1 MG/ML) IV SYRINGE: 3 mg/ mL (1 mg/mL) | INTRAVENOUS | Qty: 3

## 2021-12-03 MED FILL — HYDROMORPHONE 2 MG/ML INJECTION SOLUTION: 2 mg/mL | INTRAMUSCULAR | Qty: 1

## 2021-12-03 MED FILL — GLYCOPYRROLATE(PF) 0.4 MG/2 ML (0.2 MG/ML) IN STERILE WATER IV SYRINGE: 0.4 mg/2 mL (0.2 mg/mL) | INTRAVENOUS | Qty: 2

## 2021-12-03 MED FILL — PRECEDEX 100 MCG/ML INTRAVENOUS SOLUTION: 100 mcg/mL | INTRAVENOUS | Qty: 2

## 2021-12-03 MED FILL — DEXAMETHASONE SODIUM PHOSPHATE 4 MG/ML IJ SOLN: 4 mg/mL | INTRAMUSCULAR | Qty: 1

## 2021-12-03 MED FILL — PHENYLEPHRINE 10 MG/ML INJECTION: 10 mg/mL | INTRAMUSCULAR | Qty: 1

## 2021-12-03 MED FILL — LACTATED RINGERS IV: INTRAVENOUS | Qty: 1000

## 2021-12-03 MED FILL — MIDAZOLAM 1 MG/ML IJ SOLN: 1 mg/mL | INTRAMUSCULAR | Qty: 2

## 2021-12-03 MED FILL — ACETAMINOPHEN 500 MG TAB: 500 mg | ORAL | Qty: 2

## 2021-12-03 MED FILL — KETAMINE 50 MG/5 ML (10 MG/ML) IN NS IV SYRINGE: 50 mg/5 mL (10 mg/mL) | INTRAVENOUS | Qty: 5

## 2021-12-03 MED FILL — CELECOXIB 100 MG CAP: 100 mg | ORAL | Qty: 1

## 2021-12-03 MED FILL — SUCCINYLCHOLINE CHLORIDE 200 MG/10 ML (20 MG/ML) INTRAVENOUS SYRINGE: 200 mg/10 mL (20 mg/mL) | INTRAVENOUS | Qty: 10

## 2021-12-03 MED FILL — MOVANTIK 25 MG TABLET: 25 mg | ORAL | Qty: 1

## 2021-12-03 MED FILL — DIPRIVAN 10 MG/ML INTRAVENOUS EMULSION: 10 mg/mL | INTRAVENOUS | Qty: 50

## 2021-12-03 MED FILL — STERILE WATER FOR INJECTION: INTRAMUSCULAR | Qty: 20

## 2021-12-03 NOTE — Interval H&P Note (Signed)
Patient interviewed in holding area, identity and procedure verified.

## 2021-12-03 NOTE — Op Note (Signed)
Operative Note    Patient: Evan Shepherd  Date of Birth: [birth year]  MRN: [id number]    Date of Procedure: 12/03/2021     PREOPERATIVE DIAGNOSIS:  Request for bilateral ureteral catheter placement.    POSTOPERATIVE DIAGNOSIS:  Request for bilateral ureteral catheter placement.    PROCEDURES PERFORMED:  1.  Cystoscopy.  2.  Bilateral ureteral catheter placement.  3.  Foley catheter placement.    SURGEON:  Dennard Nip, MD    ASSISTANT:  none    ANESTHESIA:  General.    COMPLICATIONS:  None.    SPECIMENS REMOVED:  None.    IMPLANTS:  none    ESTIMATED BLOOD LOSS:  None.    TUBES AND DRAINS:  Ureteral catheters.    INDICATIONS FOR PROCEDURE:  The patient is undergoing colon resection by Dr. Nanda Quinton.  Urology was requested to place bilateral ureteral catheters to aid in ureteral identification and to identify ureteral injury if any. 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    DETAILS OF PROCEDURE:  After informed consent was obtained, he was brought to the OR and placed supine on table.  General anesthesia was smoothly induced.  A multidisciplinary time-out was performed and perioperative antibiotic was administered.  He was in dorsal lithotomy and his perineum and genitalia area were prepped and draped in standard sterile fashion.      A 21-French cystourethroscope was introduced in the meatus and advanced under direct vision.  Mild passable bulbar urethral narrowing. Prostate was mildly obstructing.  Once inside the bladder, mucosa was systematically examined with 30 and 70-degree lenses.  No mucosal lesion was seen.  Right wall bulge likely from reservoir but mucosa normal. Left ureteric orifice was cannulated with an open-ended ureteral catheter with the help of a glidewire.  This was advanced to 25 cm without any resistance.  The scope was backed out leaving the catheter in place.  Similarly, the right ureteral catheter was placed on the right side  up to 25 mark.  Scope was removed, Foley was placed with return of clear urine.  Foley and ureteral catheters were connected together.  Dr. Nanda Quinton agreed to remove the ureteral catheters at the end of the case assuming all goes well.      Dennard Nip, MD     Electronically Signed by Dennard Nip, MD on 12/03/2021 at 1:29 PM

## 2021-12-03 NOTE — Interval H&P Note (Signed)
Left message with family waiting, sister Evan Shepherd, at start of case.  [phone]    Spoke with sister at 50 with update on case start.

## 2021-12-03 NOTE — Op Note (Signed)
To be dictated.

## 2021-12-03 NOTE — Interval H&P Note (Signed)
Bilateral ureteral stents placed by Dr. Catha Nottingham.

## 2021-12-03 NOTE — Interval H&P Note (Signed)
Bilateral stents removed at the end of case.

## 2021-12-03 NOTE — Progress Notes (Signed)
Lunch Relief.

## 2021-12-03 NOTE — Interval H&P Note (Signed)
 VS stable. Awake and alert. Resting comfortably. Patient denies nausea. Pain improved. No signs of excessive bleeding. Tolerating ice chips without difficulty. Ready to transfer from PACU. Awaiting room assignment.

## 2021-12-03 NOTE — Op Note (Signed)
Brief Postoperative Note    Patient: Evan Shepherd  Date of Birth: 1956/09/19  MRN: [id number]    Date of Procedure: 12/03/2021     Pre-Op Diagnosis:  Fecal incontinence  Post-Op Diagnosis:  Fecal incontinence    Procedure:  Hand-assisted laparoscopic Creation of divided loop colostomy  Surgeon:  Theresia Majors, MD  Surgical Assistant:  Merri Ray, RN  Anesthesia: General endotracheal  Specimens:  None  Drains:  None  Estimated Blood Loss (mL): 100 mL  Crystalloid:  1800 mL  Albumin:  250 mL  Urine:  300 mL  Complications:  None apparent  Implants:  None  Findings:  Healthy-appearing colon.  Mild-to-moderate adhesions.  Anterior abdominal wall mesh.      Electronically Signed by Theresia Majors, MD

## 2021-12-03 NOTE — H&P (Signed)
Colorectal Surgery Pre-Operative History and Physical Examination Note          NAME:  Evan Shepherd   DOB:   [birth year]   MRN:   [id number]     Operation Date: 12/03/2021    Chief Complaint: Fecal incontinence     History of Present Illness:    The patient is a 65 year old male who underwent 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  The ileostomy was closed in 2007, and he had good function for the first one or two years after the operation. Gradually, he experienced increasing problems with fecal incontinence. That incontinence has now become severe. He has multiple bowel movements per day with poor control. He underwent a hemorrhoid operation about three years ago that seem to help a bit. He has tried multiple agents to modify the characteristics of stool. He has also undergone physical therapy for the pelvic floor without benefit.    I first saw him on 07/31/2021, and the plan then included attempting to arrange for anal manometry and an MRI of the pelvis.    The manometry was performed on 9/720/22, and it was interpreted as revealing ???very weak internal and external anal sphincters.???  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  Both resting and squeezing pressures were quite low, and the balloon expulsion test failed because the patient was not able to retain the balloon long enough for it to be performed.    An MRI of the pelvis was performed on 09/07/2021, and it was misinterpreted as revealing a side-to-side rectosigmoid anastomosis. It revealed the patient's penile implant reservoir in the right pelvis. Most importantly, it did not reveal any evidence of recurrent malignancy.    The patient's last colonoscopy was performed on 11/28/2020, and it revealed no neoplasms.    I saw the patient again on 10/16/2021, and we had a detailed discussion about the surgical treatment options for this  intractable fecal incontinence. Ultimately, what I recommended was excision of the colonic J-pouch and creation of a permanent colostomy. We planned on proceeding with that operation, but the patient asked to return to discuss it further.    On 11/18/2021, he and his family asked several appropriate questions. The conversation lasted for approximately 30 minutes and included having a relative, who is a physician, on speakerphone as we discussed the advantages and disadvantages of excision of the colonic J-pouch purses leaving the pouch in situ and simply creating a colostomy.      PMH:  Past Medical History:   Diagnosis Date    Chronic kidney disease, stage I     Dyslipidemia     Erectile dysfunction     Fecal incontinence     GERD (gastroesophageal reflux disease)     History of colonic polyps     History of rectal cancer 2006    Treated with neoadjuvant chemoradiation followed by surgery and then, presumably, adjuvant chemotherapy. The patient appears to have been cured.    Hypertension     Type II diabetes mellitus (HCC)        PSH:  Past Surgical History:   Procedure Laterality Date    HX COLONOSCOPY  11/28/2020    Thornton Park, M.D. (in [facility]) - Four polypoid lesions were excised. Two proved to have been tubular adenomas and two were lymphoid aggregates. A five-year follow-up interval was recommended.    HX COLONOSCOPY  2006  HX ENDOSCOPY  04/18/2020    EGD; Thornton Park, MD.    [place] Patter HEENT Right 2018    CATARACT SURGERY    HX OTHER SURGICAL  10/29/2005    Intersphincteric proctectomy with colonic J-pouch to anal anastomosis and diverting loop ileostomy Bruce Donath, M.D., [place])    HX OTHER SURGICAL  06/22/2006    Closure of loop ileostomy and removal of subcutaneous infusion port Bruce Donath, M.D., [place])    HX OTHER SURGICAL  11/01/2018    ???Hemorrhoidectomy??? Shawn Route, M.D. in [facility]) - This operation was really the excision of prolapsing colonic J pouch mucosa  using the leader sure and closure of the defects with 3-0 Vicryl sutures.    HX UROLOGICAL  10/29/2009    Insertion of Coloplast three-piece inflatable penile prosthesis (Mark C. Karsten Ro, M.D.)    IR INSERT Golden 5 YEARS  2006    PR ABDOMEN SURGERY PROC UNLISTED  2008    HERNIA REPAIR       Home Medications:  Cannot display prior to admission medications because the patient has not been admitted in this contact.       Hospital Medications:  No current facility-administered medications for this encounter.     Current Outpatient Medications   Medication Sig    FENUGREEK SEED PO Take  by mouth daily.    fluocinoNIDE (LIDEX) 0.05 % ointment Apply  to affected area daily.    gabapentin (NEURONTIN) 300 mg capsule Take 300 mg by mouth two (2) times a day.    ipratropium (ATROVENT) 42 mcg (0.06 %) nasal spray 2 Sprays by Both Nostrils route two (2) times a day.    cartilage/collagen/bor/hyalur (JOINT HEALTH PO) Take  by mouth daily.    labetaloL (NORMODYNE) 300 mg tablet Take 300 mg by mouth two (2) times a day.    lactulose (CHRONULAC) 10 gram/15 mL solution Take 10 g by mouth daily.    mesalamine (LIALDA) 1.2 gram delayed release tablet Take  by mouth Daily (before breakfast).    pantoprazole (PROTONIX) 40 mg tablet Take 40 mg by mouth two (2) times a day.    Lactobac no.41/Bifidobact no.7 (PROBIOTIC-10 PO) Take  by mouth daily.    rosuvastatin (CRESTOR) 10 mg tablet Take 10 mg by mouth nightly.    ferrous sulfate (SLOW FE PO) Take  by mouth daily.    TURMERIC PO Take  by mouth daily.    valACYclovir (VALTREX) 1 gram tablet Take 1,000 mg by mouth daily.    cholecalciferol, vitamin D3, (Vitamin D3) 50 mcg (2,000 unit) tab Take  by mouth daily.    zolpidem (AMBIEN) 10 mg tablet Take  by mouth nightly as needed for Sleep.    metFORMIN (GLUCOPHAGE) 850 mg tablet Take 850 mg by mouth two (2) times daily (with meals).    empagliflozin (Jardiance) 25 mg tablet Take 25 mg by mouth daily.    lisinopriL (PRINIVIL,  ZESTRIL) 40 mg tablet Take 40 mg by mouth daily.    insulin glargine (Lantus Solostar U-100 Insulin) 100 unit/mL (3 mL) inpn 52 Units by SubCUTAneous route nightly.    insulin aspart U-100 (NovoLOG Flexpen U-100 Insulin) 100 unit/mL (3 mL) inpn by SubCUTAneous route Before breakfast, lunch, and dinner. 16 UNITS AT BREAKFAST, 25 UNITS AT LUNCH, 30 UNITS WITH SUPPER    amLODIPine (Norvasc) 10 mg tablet Take 10 mg by mouth every evening.       Allergies:  No Known Allergies  Family History:  Family History   Problem Relation Age of Onset    Diabetes Mother     Hypertension Father     Anesth Problems Neg Hx        Social History:  Social History     Tobacco Use    Smoking status: Former     Types: Cigars    Smokeless tobacco: Never   Substance Use Topics    Alcohol use: Not Currently     Comment: OCASSIONALLY       Review of Systems:    Symptom Y/N Comments  Symptom Y/N Comments   Fever/Chills N   Chest Pain N    Cough N   Abdominal Pain N    Sputum N   Joint Pain     SOB/DOE N   Pruritis/Rash     Nausea/vomit N   Tolerating PT/OT     Diarrhea    Tolerating Diet Y    Constipation    Other       Could NOT obtain due to:        Objective:   No data found.  No intake/output data recorded.  No intake/output data recorded.    PHYSICAL EXAMINATION:    Male in no apparent distress.  The abdomen is soft, non-tender, and non-distended.  There are well healed midline and right-sided abdominal scars. There are no palpable masses.  There is a small non-tender, easily reducible umbilical hernia.  There is no appreciable organomegaly or inguinal lymphadenopathy.  The extremities are without edema.  The patient is alert and oriented, and there are no gross neurologic deficits.       Data Review    11/27/21 15:24   WBC 7.9   HGB 16.4   HCT 52.4 (H)   PLATELET 237   Sodium 138   Potassium 4.3   Chloride 107   CO2 25   Anion gap 6   Glucose 69   BUN 16   Creatinine 0.90   BUN/Creatinine ratio 18   Calcium 9.3   eGFR >60   Hemoglobin  A1c, (calculated) 6.1 (H)   Est. average glucose 128   (H): Data is abnormally high  (L): Data is abnormally low                      Assessment and Plan:      The patient's chronic fecal incontinence has proven intractable. Attempts to improve the sphincter muscle function have failed.   As we had previously discussed at length, I believe that the best treatment for the problem will be fecal diversion via a colostomy.  After our long discussion on 11/18/2021, we decided to proceed with the creation of a divided loop colostomy without performing the excision of the colonic J-pouch. By staying out of the pelvis to whatever extent possible, we hope to avoid any unnecessary risks or complications.  The patient asked if the prolapsing pouch mucosa could be dealt with at the same time, and I recommended against that. That problem might be largely ameliorated by the fecal diversion, so I think that we should wait and see what happens.    The risks of the operation have been discussed in detail, and he has agreed to proceed.      Principal Problem:    Fecal incontinence (12/03/2021)    Active Problems:    Type II diabetes mellitus ([place]) ()      Hypertension ()

## 2021-12-03 NOTE — Interval H&P Note (Signed)
1623: Family updated by Maudry Diego, RN.

## 2021-12-03 NOTE — Op Note (Signed)
[facility]  OPERATIVE REPORT    Name:  Evan Shepherd, Evan Shepherd  MR#:  [id number]  DOB:  04/17/1956  ACCOUNT #:  [id number]    DATE OF SERVICE:  12/03/2021    PREOPERATIVE DIAGNOSIS:  Fecal incontinence    POSTOPERATIVE DIAGNOSIS:  Fecal incontinence    PROCEDURE PERFORMED:  Hand-assisted laparoscopic creation of divided loop colostomy    SURGEON:  Theresia Majors, MD    ASSISTANT:  Merri Ray, RN    ANESTHESIA:  General endotracheal    SPECIMENS REMOVED:  None    TUBES AND DRAINS:  None    ESTIMATED BLOOD LOSS:  100 mL    IV FLUIDS:  Crystalloid 1800 mL; albumin 250 mL.    URINE OUTPUT:  300 mL    COMPLICATIONS:  None apparent    IMPLANTS:  None    INDICATIONS FOR THE PROCEDURE:  The patient is a 65 year old male who underwent 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  The ileostomy was closed in 2007, and he had good function for the first 1 or 2 years after the operation.  Gradually, he experienced increasing problems with fecal incontinence.  That incontinence has now become severe.  He has multiple bowel movements per day with poor control.  He underwent a hemorrhoid operation about 3 years ago that seemed to help a bit.  He has tried multiple agents to modify the characteristics of his stools and has undergone pelvic floor physical therapy without benefit.  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  Both resting and squeezing pressures were quite low, and the balloon expulsion test failed in that the patient was not able to retain the balloon long enough for that to be performed.  An MRI of the pelvis was performed on 09/07/2021, and this study was misinterpreted as revealing a side-to-side rectosigmoid anastomosis.  It revealed the patient's penile  implant reservoir in the right pelvis.  Most importantly, it did not reveal any evidence of recurrent malignancy.  The patient's last colonoscopy was performed on 11/28/2020, and it revealed no neoplasms.  On 10/16/2021, we had a detailed discussion about the surgical treatment options for this intractable fecal incontinence.  Ultimately, what I recommended was excision of the colonic J-pouch and creation of a permanent colostomy.  We planned on proceeding with that operation, but the patient asked to return to discuss it further.  On 11/18/2021, he and his family asked several appropriate questions.  The conversation lasted for approximately 30 minutes and included having a relative, who is a physician, on speaker phone as we discussed the advantages and disadvantages of excision of the colonic J-pouch versus leaving the pouch in situ and just creating a colostomy.  In the end, we decided to proceed with the creation of a divided loop colostomy without performing the excision of the colonic J-pouch.  By staying out of the pelvis to whatever extent possible, it was hoped that unnecessary risks and complications would be avoided.  The risks of the planned operation had been discussed in detail, and the patient agreed to proceed.    OPERATIVE FINDINGS:  The colon appeared to be healthy.  There were mild-to-moderate adhesions.  There was an anterior abdominal wall mesh.  There was no evidence of malignancy.    DESCRIPTION OF PROCEDURE:  After informed consent was obtained and after the abdomen had been marked for a possible colostomy site by the wound and ostomy care nurse, the patient was taken to the operating room.  Standard monitoring devices were attached and adequate general endotracheal anesthesia was induced.  He was then positioned in lithotomy with the lower extremities fitted with pneumatic compression stockings and supported by the padded hydraulic Allen stirrups.  The perineum was prepped and draped in the  usual sterile fashion then Dennard Nip, MD  performed cystoscopy and placement of bilateral ureteral catheters and a Foley catheter.  That procedure was somewhat difficult because of the patient's penile implant, but it was successful.  Once it was completed, digital rectal examination was performed followed by the introduction of a 3-way Foley catheter with a 30 mL balloon into the colonic J-pouch.  The catheter was then used to irrigate the bowel with sterile water until the effluent ran clear.  The catheter was left in place and allowed to drain by gravity.  The patient was repositioned into a low lithotomy with a gel pad placed beneath the sacrum, both upper extremities tucked, and an orogastric tube inserted by the anesthetist.  500 mg of metronidazole and 2 g of cefazolin were administered parenterally.  The abdomen and perineum were prepped and draped in the usual sterile fashion.  The patient had also undergone mechanical and antibiotic bowel preparation at home on the day before the operation.    0.25% bupivacaine with epinephrine was infiltrated subcutaneously prior to making the skin incision.  0.5% bupivacaine with epinephrine was not available.  The abdomen was entered through a supraumbilical midline incision that was made through the existing scar from the previous surgery.  The dissection through the subcutaneous adipose tissue was performed using the electrocautery, and then the fascia and scar in the midline was divided sharply to facilitate safe entry into the abdominal cavity.  As it turned out, it was necessary to go through the hernia mesh.  The mesh was divided in the midline using a combination of sharp dissection and electrocautery.  Adhesions to the anterior abdominal wall were gradually taken down using the electrocautery and the articulating Enseal.  Once sufficient space had been created, the GelPort was placed in the wound.  It would function as both wound retractor and wound  protector.  Pneumoperitoneum was achieved through a 12-mm port inserted through the GelPort and, under direct vision, the 5-mm port was placed in the suprapubic midline.  The 5 mm 30 degree laparoscope was moved to the suprapubic site and the non-dominant hand was inserted through the GelPort.  The abdomen was explored visually and by palpation.  A 12-mm port was placed in the right lower quadrant and through it, the articulating Enseal was deployed.  This device would be used for most of the intracorporeal dissection.    The bowel was examined and mobilization was begun using the Enseal to divide the scar tissue that was investing the bowel and holding it in place.  During the patient's original rectal cancer resection, the IMA, IMV and branches of the middle colic artery were all divided and ligated and the splenic flexure had been taken down.  The colon mobilization continued until it seemed that there was going to be sufficient length for creation of the colostomy.  As had been planned, this would be a loop or a divided loop stoma.  It was felt that this would be a more desirable type than an end colostomy  with either a completely separated mucous fistula or a long Hartmann's pouch.  Extensive dissection was performed in order to achieve the length required.  When it seemed that this had been done, the site marked as the first choice for the location of the colostomy was re-identified.  There, a circular skin incision was made and then a disk of skin and subcutaneous tissue was excised using the electrocautery.  The anterior rectus sheath was opened using the electrocautery.  The [employer] retractors were used to separate the rectus muscle fibers bluntly and to expose the posterior sheath which was then opened using the electrocautery.  The wound here was made large enough to accommodate two fingers easily and also to accommodate the loop of colon once it had been sufficiently prepared.  The colon was then  placed on traction using an 18-French red rubber catheter passed through a window in the colonic mesentery at the point chosen for the ostomy.  The bowel was brought through the abdominal wall and then the afferent and efferent limbs were tacked in place intraperitoneally using 2-0 and 3-0 silk sutures.  The GelPort had been removed prior to creating the aperture for the colostomy.  Four small sheets of Seprafilm were placed on the viscera.    The dedicated closing instrument set was opened.  The patient was re-draped and the periumbilical midline fascial closure was performed using two running #1 Prolene sutures.  The permanent suture was chosen over the PDS because the fascial closure was a reapproximation of the cut edges of the mesh.  Once this closure was performed, the subcutaneous portions of all three wounds were irrigated with saline and the subcutaneous adipose tissue in the periumbilical midline wound was reapproximated using 2-0 Vicryl sutures.  Skin closure at all three wounds were performed using subcuticular 4-0 Monocryl sutures and Dermabond.    The colostomy was then matured by first dividing the bowel using the GIA-75 stapling device with a blue cartridge then using 3-0 Vicryl sutures to secure the efferent limb to the fascia.  A small portion of the staple line of the efferent limb was then excised.  Next, the afferent limb staple line was excised using the electrocautery.  Numerous 3-0 Vicryl sutures were used to create the mucocutaneous junction with some eversion of the bowel in a secure fashion that would not create ischemia.  The efferent limb was also tacked to the afferent limb using 3-0 Vicryl sutures.  Final inspection revealed the bowel to be apparently well perfused and adequately budded although there was a bit of a depression in the contour of the abdominal skin along the inferior aspect of the stoma.  An ostomy appliance was obtained and cut to the appropriate size and put in  place.    There were no apparent complications, and the patient appeared to have tolerated the procedure well.  At its conclusion, the orogastric tube and the ureteral catheters were removed.  The transanal catheter was also removed.  The Foley catheter was left in place.  The patient was extubated and transported in stable condition to the recovery room.  All sponge, needle and instrument counts were correct.        Theresia Majors, MD      [id number]  D:  12/04/2021 0:28  T:  12/04/2021 9:06  JOB #:  [id number]  CC:  Theresia Majors, MD

## 2021-12-03 NOTE — Anesthesia Pre-Procedure Evaluation (Signed)
Relevant Problems   No relevant active problems       Anesthetic History   No history of anesthetic complications            Review of Systems / Medical History  Patient summary reviewed, nursing notes reviewed and pertinent labs reviewed    Pulmonary  Within defined limits                 Neuro/Psych   Within defined limits           Cardiovascular  Within defined limits  Hypertension              Exercise tolerance: >4 METS     GI/Hepatic/Renal  Within defined limits   GERD           Endo/Other  Within defined limits  Diabetes    Cancer     Other Findings              Physical Exam    Airway  Mallampati: II  TM Distance: > 6 cm  Neck ROM: normal range of motion   Mouth opening: Normal     Cardiovascular  Regular rate and rhythm,  S1 and S2 normal,  no murmur, click, rub, or gallop             Dental  No notable dental hx       Pulmonary  Breath sounds clear to auscultation               Abdominal  GI exam deferred       Other Findings            Anesthetic Plan    ASA: 3  Anesthesia type: general          Induction: Intravenous  Anesthetic plan and risks discussed with: Patient

## 2021-12-04 LAB — GLUCOSE, POC
Glucose (POC): 131 mg/dL — ABNORMAL HIGH (ref 65–117)
Glucose (POC): 230 mg/dL — ABNORMAL HIGH (ref 65–117)
Glucose (POC): 186 mg/dL — ABNORMAL HIGH (ref 65–117)

## 2021-12-04 LAB — CBC W/O DIFF
ABSOLUTE NRBC: 0 10*3/uL (ref 0.00–0.01)
HCT: 47.2 % (ref 36.6–50.3)
HGB: 14.7 g/dL (ref 12.1–17.0)
MCH: 25.9 PG — ABNORMAL LOW (ref 26.0–34.0)
MCHC: 31.1 g/dL (ref 30.0–36.5)
MCV: 83.1 FL (ref 80.0–99.0)
MPV: 10.7 FL (ref 8.9–12.9)
NRBC: 0 PER 100 WBC
PLATELET: 192 10*3/uL (ref 150–400)
RBC: 5.68 M/uL (ref 4.10–5.70)
RDW: 14.9 % — ABNORMAL HIGH (ref 11.5–14.5)
WBC: 9.6 10*3/uL (ref 4.1–11.1)

## 2021-12-04 LAB — MAGNESIUM
Magnesium: 2.2 mg/dL (ref 1.6–2.4)
Magnesium: 2.2 mg/dL (ref 1.6–2.4)

## 2021-12-04 LAB — METABOLIC PANEL, BASIC
Anion gap: 7 mmol/L (ref 5–15)
BUN/Creatinine ratio: 19 (ref 12–20)
BUN: 16 MG/DL (ref 6–20)
CO2: 21 mmol/L (ref 21–32)
Calcium: 8.6 MG/DL (ref 8.5–10.1)
Chloride: 107 mmol/L (ref 97–108)
Creatinine: 0.84 MG/DL (ref 0.70–1.30)
Glucose: 147 mg/dL — ABNORMAL HIGH (ref 65–100)
Potassium: 4.6 mmol/L (ref 3.5–5.1)
Sodium: 135 mmol/L — ABNORMAL LOW (ref 136–145)
eGFR: >60 mL/min/{1.73_m2} (ref >60)

## 2021-12-04 LAB — PHOSPHORUS
Phosphorus: 4 MG/DL (ref 2.6–4.7)
Phosphorus: 4 MG/DL (ref 2.6–4.7)

## 2021-12-04 LAB — BASIC METABOLIC PANEL
Anion Gap: 7 mmol/L (ref 5–15)
BUN/Creatinine Ratio: 19 (ref 12–20)
BUN: 16 MG/DL (ref 6–20)
CO2: 21 mmol/L (ref 21–32)
Calcium: 8.6 MG/DL (ref 8.5–10.1)
Chloride: 107 mmol/L (ref 97–108)
Creatinine: 0.84 MG/DL (ref 0.70–1.30)
Est, Glom Filt Rate: >60 mL/min/{1.73_m2} (ref >60)
Glucose: 147 mg/dL — ABNORMAL HIGH (ref 65–100)
Potassium: 4.6 mmol/L (ref 3.5–5.1)
Sodium: 135 mmol/L — ABNORMAL LOW (ref 136–145)

## 2021-12-04 LAB — POCT GLUCOSE
POC Glucose: 131 mg/dL — ABNORMAL HIGH (ref 65–117)
POC Glucose: 230 mg/dL — ABNORMAL HIGH (ref 65–117)
POC Glucose: 186 mg/dL — ABNORMAL HIGH (ref 65–117)

## 2021-12-04 LAB — CBC
Hematocrit: 47.2 % (ref 36.6–50.3)
Hemoglobin: 14.7 g/dL (ref 12.1–17.0)
MCH: 25.9 PG — ABNORMAL LOW (ref 26.0–34.0)
MCHC: 31.1 g/dL (ref 30.0–36.5)
MCV: 83.1 FL (ref 80.0–99.0)
MPV: 10.7 FL (ref 8.9–12.9)
NRBC Absolute: 0 10*3/uL (ref 0.00–0.01)
Nucleated RBCs: 0 PER 100 WBC
Platelets: 192 10*3/uL (ref 150–400)
RBC: 5.68 M/uL (ref 4.10–5.70)
RDW: 14.9 % — ABNORMAL HIGH (ref 11.5–14.5)
WBC: 9.6 10*3/uL (ref 4.1–11.1)

## 2021-12-04 MED ORDER — AMLODIPINE 5 MG TAB
5 mg | Freq: Every evening | ORAL | Status: AC
Start: 2021-12-04 — End: 2021-12-10
  Administered 2021-12-04 – 2021-12-09 (×6): via ORAL

## 2021-12-04 MED ORDER — FLUOCINONIDE 0.05 % OINTMENT
0.05 % | Freq: Every day | CUTANEOUS | Status: AC
Start: 2021-12-04 — End: 2021-12-10
  Administered 2021-12-04 – 2021-12-10 (×7): via TOPICAL

## 2021-12-04 MED ORDER — MEPERIDINE (PF) 25 MG/ML INJ SOLUTION
25 mg/ml | INTRAMUSCULAR | Status: DC | PRN
Start: 2021-12-04 — End: 2021-12-04
  Administered 2021-12-04: 05:00:00 via INTRAVENOUS

## 2021-12-04 MED ORDER — DEXTROSE 10% BOLUS IV (NEONATE)
10 % | INTRAVENOUS | Status: DC | PRN
Start: 2021-12-04 — End: 2021-12-10

## 2021-12-04 MED ORDER — .PHARMACY TO SUBSTITUTE PER PROTOCOL
Status: DC | PRN
Start: 2021-12-04 — End: 2021-12-10

## 2021-12-04 MED ORDER — INSULIN GLARGINE 100 UNIT/ML INJECTION
100 unit/mL | Freq: Every evening | SUBCUTANEOUS | Status: AC
Start: 2021-12-04 — End: 2021-12-10
  Administered 2021-12-05 – 2021-12-10 (×6): via SUBCUTANEOUS

## 2021-12-04 MED ORDER — INSULIN LISPRO 100 UNIT/ML INJECTION
100 unit/mL | Freq: Four times a day (QID) | SUBCUTANEOUS | Status: DC
Start: 2021-12-04 — End: 2021-12-04
  Administered 2021-12-04: 18:00:00 via SUBCUTANEOUS

## 2021-12-04 MED ORDER — LIDOCAINE 8 MG/ML INFUSION
8 mg/mL (0. %) | INTRAVENOUS | Status: DC
Start: 2021-12-04 — End: 2021-12-05
  Administered 2021-12-03 – 2021-12-05 (×3): via INTRAVENOUS

## 2021-12-04 MED ORDER — METFORMIN 850 MG TAB
850 mg | Freq: Two times a day (BID) | ORAL | Status: AC
Start: 2021-12-04 — End: 2021-12-10

## 2021-12-04 MED ORDER — FENTANYL CITRATE (PF) 50 MCG/ML IJ SOLN
50 mcg/mL | Freq: Once | INTRAMUSCULAR | Status: DC | PRN
Start: 2021-12-04 — End: 2021-12-04

## 2021-12-04 MED ORDER — LISINOPRIL 20 MG TAB
20 mg | Freq: Every day | ORAL | Status: AC
Start: 2021-12-04 — End: 2021-12-10
  Administered 2021-12-04 – 2021-12-10 (×7): via ORAL

## 2021-12-04 MED ORDER — INSULIN LISPRO 100 UNIT/ML INJECTION
100 unit/mL | Freq: Four times a day (QID) | SUBCUTANEOUS | Status: AC
Start: 2021-12-04 — End: 2021-12-10
  Administered 2021-12-04 – 2021-12-10 (×13): via SUBCUTANEOUS

## 2021-12-04 MED ORDER — OXYCODONE 5 MG TAB
5 mg | ORAL | Status: AC | PRN
Start: 2021-12-04 — End: 2021-12-10
  Administered 2021-12-04 – 2021-12-06 (×7): via ORAL

## 2021-12-04 MED ORDER — GABAPENTIN 300 MG CAP
300 mg | Freq: Two times a day (BID) | ORAL | Status: AC
Start: 2021-12-04 — End: 2021-12-10
  Administered 2021-12-04 – 2021-12-10 (×13): via ORAL

## 2021-12-04 MED ORDER — PANTOPRAZOLE 40 MG TAB, DELAYED RELEASE
40 mg | Freq: Two times a day (BID) | ORAL | Status: AC
Start: 2021-12-04 — End: 2021-12-10
  Administered 2021-12-04 – 2021-12-10 (×13): via ORAL

## 2021-12-04 MED ORDER — ZOLPIDEM 5 MG TAB
5 mg | Freq: Every evening | ORAL | Status: AC | PRN
Start: 2021-12-04 — End: 2021-12-10

## 2021-12-04 MED ORDER — IPRATROPIUM BROMIDE 0.06 % NASAL SPRAY AEROSOL
42 mcg (0.06 %) | Freq: Two times a day (BID) | NASAL | Status: AC
Start: 2021-12-04 — End: 2021-12-10
  Administered 2021-12-04 – 2021-12-10 (×10): via NASAL

## 2021-12-04 MED ORDER — SODIUM CHLORIDE 0.9 % IJ SYRG
INTRAMUSCULAR | Status: AC | PRN
Start: 2021-12-04 — End: 2021-12-10

## 2021-12-04 MED ORDER — DEXTROSE 10% BOLUS IV (NEONATE)
10 % | INTRAVENOUS | Status: AC | PRN
Start: 2021-12-04 — End: 2021-12-10

## 2021-12-04 MED ORDER — EMPAGLIFLOZIN 25 MG TABLET
25 mg | Freq: Every day | ORAL | Status: DC
Start: 2021-12-04 — End: 2021-12-04

## 2021-12-04 MED ORDER — ONDANSETRON (PF) 4 MG/2 ML INJECTION
4 mg/2 mL | INTRAMUSCULAR | Status: AC | PRN
Start: 2021-12-04 — End: 2021-12-04

## 2021-12-04 MED ORDER — GLUCOSE 4 GRAM CHEWABLE TAB
4 gram | ORAL | Status: AC | PRN
Start: 2021-12-04 — End: 2021-12-10

## 2021-12-04 MED ORDER — LABETALOL 200 MG TAB
200 mg | Freq: Two times a day (BID) | ORAL | Status: AC
Start: 2021-12-04 — End: 2021-12-10
  Administered 2021-12-04 – 2021-12-10 (×13): via ORAL

## 2021-12-04 MED ORDER — GLUCAGON 1 MG INJECTION
1 mg | INTRAMUSCULAR | Status: AC | PRN
Start: 2021-12-04 — End: 2021-12-10

## 2021-12-04 MED ORDER — ONDANSETRON 4 MG TAB, RAPID DISSOLVE
4 mg | ORAL | Status: AC | PRN
Start: 2021-12-04 — End: 2021-12-10
  Administered 2021-12-05 – 2021-12-06 (×5): via ORAL

## 2021-12-04 MED ORDER — SODIUM CHLORIDE 0.9 % IJ SYRG
Freq: Three times a day (TID) | INTRAMUSCULAR | Status: AC
Start: 2021-12-04 — End: 2021-12-10
  Administered 2021-12-04 – 2021-12-10 (×20): via INTRAVENOUS

## 2021-12-04 MED ORDER — ONDANSETRON (PF) 4 MG/2 ML INJECTION
4 mg/2 mL | Freq: Four times a day (QID) | INTRAMUSCULAR | Status: AC | PRN
Start: 2021-12-04 — End: 2021-12-10

## 2021-12-04 MED ORDER — MIDAZOLAM 1 MG/ML IJ SOLN
1 mg/mL | INTRAMUSCULAR | Status: DC | PRN
Start: 2021-12-04 — End: 2021-12-03

## 2021-12-04 MED ORDER — LACTATED RINGERS IV
INTRAVENOUS | Status: AC
Start: 2021-12-04 — End: 2021-12-04
  Administered 2021-12-03 – 2021-12-04 (×3): via INTRAVENOUS

## 2021-12-04 MED ORDER — ACETAMINOPHEN 500 MG TAB
500 mg | Freq: Four times a day (QID) | ORAL | Status: AC
Start: 2021-12-04 — End: 2021-12-10
  Administered 2021-12-04 – 2021-12-10 (×26): via ORAL

## 2021-12-04 MED ORDER — ENOXAPARIN 40 MG/0.4 ML SUB-Q SYRINGE
40 mg/0.4 mL | Freq: Every day | SUBCUTANEOUS | Status: AC
Start: 2021-12-04 — End: 2021-12-10
  Administered 2021-12-04 – 2021-12-10 (×7): via SUBCUTANEOUS

## 2021-12-04 MED ORDER — NALOXEGOL 12.5 MG TABLET
12.5 mg | Freq: Every day | ORAL | Status: AC
Start: 2021-12-04 — End: 2021-12-11
  Administered 2021-12-04 – 2021-12-10 (×7): via ORAL

## 2021-12-04 MED FILL — LISINOPRIL 20 MG TAB: 20 mg | ORAL | Qty: 2

## 2021-12-04 MED FILL — PANTOPRAZOLE 40 MG TAB, DELAYED RELEASE: 40 mg | ORAL | Qty: 1

## 2021-12-04 MED FILL — FLUOCINONIDE 0.05 % OINTMENT: 0.05 % | CUTANEOUS | Qty: 15

## 2021-12-04 MED FILL — OXYCODONE 5 MG TAB: 5 mg | ORAL | Qty: 1

## 2021-12-04 MED FILL — AMLODIPINE 5 MG TAB: 5 mg | ORAL | Qty: 2

## 2021-12-04 MED FILL — LABETALOL 100 MG TAB: 100 mg | ORAL | Qty: 1

## 2021-12-04 MED FILL — GABAPENTIN 300 MG CAP: 300 mg | ORAL | Qty: 1

## 2021-12-04 MED FILL — INSULIN LISPRO 100 UNIT/ML INJECTION: 100 unit/mL | SUBCUTANEOUS | Qty: 1

## 2021-12-04 MED FILL — ACETAMINOPHEN 500 MG TAB: 500 mg | ORAL | Qty: 2

## 2021-12-04 MED FILL — MOVANTIK 12.5 MG TABLET: 12.5 mg | ORAL | Qty: 1

## 2021-12-04 MED FILL — ENOXAPARIN 40 MG/0.4 ML SUB-Q SYRINGE: 40 mg/0.4 mL | SUBCUTANEOUS | Qty: 0.4

## 2021-12-04 MED FILL — LACTATED RINGERS IV: INTRAVENOUS | Qty: 1000

## 2021-12-04 MED FILL — LIDOCAINE 8 MG/ML INFUSION: 8 mg/mL (0. %) | INTRAVENOUS | Qty: 250

## 2021-12-04 MED FILL — NORMAL SALINE FLUSH 0.9 % INJECTION SYRINGE: INTRAMUSCULAR | Qty: 40

## 2021-12-04 MED FILL — MEPERIDINE (PF) 25MG/ML INJECTION: 25 mg/mL | INTRAMUSCULAR | Qty: 1

## 2021-12-04 MED FILL — IPRATROPIUM BROMIDE 0.06 % NASAL SPRAY AEROSOL: 42 mcg (0.06 %) | NASAL | Qty: 15

## 2021-12-04 NOTE — Consults (Signed)
Nutrition Education    Educated on Low Fiber and Colostomy diet.  Learners: Patient  Readiness: Acceptance  Method: Explanation and Handout  Response: Verbalizes Understanding  Contact name and number provided.    Era Bumpers, RD  Contact via [employer]

## 2021-12-04 NOTE — Interval H&P Note (Signed)
 TRANSFER - OUT REPORT:    Verbal report given to [facility] Rn(name) on Evan Shepherd  being transferred to 521(unit) for routine post - op       Report consisted of patient's Situation, Background, Assessment and   Recommendations(SBAR).     Time Pre op antibiotic given:1734  Anesthesia Stop time: 1754    Information from the following report(s) SBAR, OR Summary, Procedure Summary, Intake/Output, Recent Results, Cardiac Rhythm NSR, and Alarm Parameters  was reviewed with the receiving nurse.    Opportunity for questions and clarification was provided.     Is the patient on 02? NO       L/Min 0       Other 0    Is the patient on a monitor? NO    Is the nurse transporting with the patient? YES    Surgical Waiting Area notified of patient's transfer from PACU? YES      The following personal items collected during your admission accompanied patient upon transfer:   Dental Appliance: Dental Appliances: None  Vision:    Hearing Aid:    Jewelry: Jewelry: None  Clothing: Clothing: With patient  Other Valuables: Other Valuables: None  Valuables sent to safe:

## 2021-12-04 NOTE — Progress Notes (Signed)
PHYSICAL THERAPY EVALUATION/DISCHARGE  Patient: Evan Shepherd (65 y.o. male)  Date: 12/04/2021  Primary Diagnosis: Fecal incontinence [R15.9]  Procedure(s) (LRB):  HAND ASSISTED LAPAROSCOPIC CREATION OF DIVIDED LOOP COLOSTOMY (E R A S), CYSTOSCOPY WITH INSERTION BILATERAL URETERAL CATHETERS (N/A)  Cystoscopy with Bilateral Ureteral stent placement (Bilateral) 1 Day Post-Op   Precautions:          ASSESSMENT  Based on the objective data described below, the patient presents with independent bed mobility and the ability to ambulate 200 feet pushing/holding the IV pole.  He is cooperative and motivated and eager to walk and be up out of the bed.  He has a steady gait and was able to push the IV pole but didn't require it for support.  He doesn't use assistive15 devices at home. He is ok to ambulate with his family and nursing.  No home need anticipated. No further PT is indicated.  It is anticipated the patient will progress independently.      Functional Outcome Measure:  The patient scored 85 on the Barthel outcome measure which is indicative of 15% debility.      Other factors to consider for discharge: None     Further skilled acute physical therapy is not indicated at this time.  Will complete the order.      PLAN :  Recommendation for discharge: (in order for the patient to meet his/her long term goals)  No skilled physical therapy/ follow up rehabilitation needs identified at this time.    This discharge recommendation:  Has not yet been discussed the attending provider and/or case management    IF patient discharges home will need the following DME: none       SUBJECTIVE:   Patient stated ???I am ready to walk.???    OBJECTIVE DATA SUMMARY:   HISTORY:    Past Medical History:   Diagnosis Date    Chronic kidney disease, stage I     Dyslipidemia     Erectile dysfunction     Fecal incontinence     GERD (gastroesophageal reflux disease)     History of colonic polyps     History of rectal cancer 2006    Treated  with neoadjuvant chemoradiation followed by surgery and then, presumably, adjuvant chemotherapy. The patient appears to have been cured.    Hypertension     Type II diabetes mellitus [facility])      Past Surgical History:   Procedure Laterality Date    HX COLONOSCOPY  11/28/2020    Tressia Danas, M.D. (in [facility]) - Four polypoid lesions were excised. Two proved to have been tubular adenomas and two were lymphoid aggregates. A five-year follow-up interval was recommended.    HX COLONOSCOPY  2006    HX ENDOSCOPY  04/18/2020    EGD; Tressia Danas, MD.    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 Rosalyn Charters, M.D. in [facility]) - This operation was really the excision of prolapsing colonic J pouch mucosa using the leader sure and closure of the defects with 3-0 Vicryl sutures.    HX UROLOGICAL  10/29/2009  Insertion of Coloplast three-piece inflatable penile prosthesis (Mark C. Vernie Ammons, M.D.)    IR INSERT TUNL CVC W PORT OVER 5 YEARS  2006    PR ABDOMEN SURGERY PROC UNLISTED  2008    HERNIA REPAIR       Prior level of function: Independent ambulation with no AD.   Personal factors and/or comorbidities impacting plan of care: None    Home Situation  Home Environment: Private residence  # Steps to Enter: 4  One/Two Story Residence: Two story, live on 1st floor  Living Alone: No  Support Systems: Spouse/Significant Other  Patient Expects to be Discharged to:: Home with family assistance  Current DME Used/Available at Home: None    EXAMINATION/PRESENTATION/DECISION MAKING:   Critical Behavior:  Neurologic State: Alert  Orientation Level: Oriented  X4  Cognition: Appropriate decision making, Follows commands  Safety/Judgement: Good awareness of safety precautions  Hearing:     Skin:  per nursing.   Edema: per nursing.   Range Of Motion:  AROM: Within functional limits           PROM: Within functional limits           Strength:    Strength: Within functional limits                    Tone & Sensation:   Tone: Normal              Sensation: Intact               Coordination:  Coordination: Within functional limits  Vision:      Functional Mobility:  Bed Mobility:  Rolling: Independent  Supine to Sit: Independent     Scooting: Independent  Transfers:  Sit to Stand: Independent  Stand to Sit: Independent                       Balance:   Sitting: Intact  Standing: Intact  Ambulation/Gait Training:  Distance (ft): 200 Feet (ft)  Assistive Device: Other (comment) (Held and pushed the IV pole)  Ambulation - Level of Assistance: Independent                                             Steady gait.                Therapeutic Exercises:   Encouraged to do seated ex of ankle pumps, LAQ, and marching.     Functional Measure:  Barthel Index:    Bathing: 5  Bladder: 0  Bowels: 10  Grooming: 5  Dressing: 5  Feeding: 10  Mobility: 15  Stairs: 10  Toilet Use: 10  Transfer (Bed to Chair and Back): 15  Total: 85/100       The Barthel ADL Index: Guidelines  1. The index should be used as a record of what a patient does, not as a record of what a patient could do.  2. The main aim is to establish degree of independence from any help, physical or verbal, however minor and for whatever reason.  3. The need for supervision renders the patient not independent.  4. A patient's performance should be established using the best available evidence. Asking the patient, friends/relatives and nurses are the usual sources, but direct observation and common sense are also important. However direct testing is not needed.  5. Usually the patient's performance over the preceding 24-48 hours is  important, but occasionally longer periods will be relevant.  6. Middle categories imply that the patient supplies over 50 per cent of the effort.  7. Use of aids to be independent is allowed.    Score Interpretation (from Sinoff 1997)   80-100 Independent   60-79 Minimally independent   40-59 Partially dependent   20-39 Very dependent   <20 Totally dependent     -Mahoney, F.l., Barthel, D.W. (1965). Functional evaluation: the Barthel Index. [place] [address] [place] (14)2.  -Sinoff, G., Ore, L. (1997). The Barthel activities of daily living index: self-reporting versus actual performance in the old (> or = 75 years). Journal of American Geriatric Society 45(7), [phone].   -Kenn File [place], J.J.M.F, Noel Christmas., Imagene Gurney. (1999). Measuring the change in disability after inpatient rehabilitation; comparison of the responsiveness of the Barthel Index and Functional Independence Measure. Journal of Neurology, Neurosurgery, and Psychiatry, 66(4), [phone].  Dawson Bills, N.J.A, Scholte op [place],  W.J.M, & Koopmanschap, M.A. (2004) Assessment of post-stroke quality of life in cost-effectiveness studies: The usefulness of the Barthel Index and the EuroQoL-5D. Quality of Life Research, 86, 811-91           Physical Therapy Evaluation Charge Determination   History Examination Presentation Decision-Making   LOW Complexity : Zero comorbidities / personal factors that will impact the outcome / POC LOW Complexity : 1-2 Standardized tests and measures addressing body structure, function, activity limitation and / or participation in recreation  LOW Complexity : Stable, uncomplicated  LOW Complexity : FOTO score of 75-100      Based on the above components, the patient evaluation is determined to be of the following complexity level: LOW         Activity Tolerance:   Good      After treatment patient left in no apparent distress:   Sitting in chair and Call bell within reach    COMMUNICATION/EDUCATION:   The patient???s plan  of care was discussed with: Registered nurse.     Fall prevention education was provided and the patient/caregiver indicated understanding., Patient/family have participated as able in goal setting and plan of care., and Patient/family agree to work toward stated goals and plan of care.    Thank you for this referral.  Pleas Patricia, PT   Time Calculation: 20 mins

## 2021-12-04 NOTE — Progress Notes (Signed)
General Daily Progress Note    Admission Date: 12/03/2021  Hospital Day 2  Post-Op Day 1  Subjective:   No nausea.  No dyspnea or chest pain.  Pain control adequate.      Objective:   Patient Vitals for the past 24 hrs:   BP Temp Pulse Resp SpO2 Height Weight   12/04/21 0821 117/69 98.1 ??F (36.7 ??C) 85 20 91 % -- --   12/04/21 0458 -- -- -- -- -- -- 102.5 kg (225 lb 15.5 oz)   12/04/21 0105 124/69 98.4 ??F (36.9 ??C) 81 -- 94 % -- --   12/04/21 0000 118/75 98.3 ??F (36.8 ??C) 79 21 93 % -- --   12/03/21 2300 115/64 -- 77 20 -- -- --   12/03/21 2200 117/62 -- 76 22 93 % -- --   12/03/21 2100 116/64 -- 72 21 93 % -- --   12/03/21 2030 -- -- 71 25 93 % -- --   12/03/21 2000 (!) 116/58 -- 70 21 92 % -- --   12/03/21 1945 121/63 -- 72 18 93 % -- --   12/03/21 1930 114/69 -- 68 20 92 % -- --   12/03/21 1915 108/62 98.1 ??F (36.7 ??C) 73 24 96 % -- --   12/03/21 1900 98/68 -- 69 21 96 % -- --   12/03/21 1845 -- -- 68 22 95 % -- --   12/03/21 1830 111/62 -- 66 26 94 % -- --   12/03/21 1815 117/61 -- 65 30 94 % -- --   12/03/21 1805 (!) 113/59 -- 67 27 93 % -- --   12/03/21 1800 122/64 97.3 ??F (36.3 ??C) 71 22 96 % -- --   12/03/21 1755 113/61 -- 68 29 94 % -- --   12/03/21 1753 116/62 97.9 ??F (36.6 ??C) 68 22 96 % -- --   12/03/21 1750 116/62 -- 69 22 92 % -- --   12/03/21 1745 (!) 111/55 -- -- -- -- -- --   12/03/21 1035 131/80 99.3 ??F (37.4 ??C) 64 16 97 % 5' 10"  (1.778 m) 98 kg (216 lb)     No intake/output data recorded.  12/05 1901 - 12/07 0700  In: 2190 [I.V.:2190]  Out: 1275 [Urine:1175]      Physical Examination:  General Appearance - No distress.   Abdomen - Somewhat distended.  Appropriately tender.  Incisions clean, dry, intact, and with ecchymosis but no abnormal erythema.  Colostomy edematous but viable.  GU - Foley catheter draining slightly bloody urine.  Extremities -  Pneumatic compression stockings in use.  No calf tenderness.            Data Review   Recent Results (from the past 24 hour(s))   TYPE & SCREEN     Collection Time: 12/03/21 11:17 AM   Result Value Ref Range    Crossmatch Expiration 12/06/2021,2359     ABO/Rh(D) B POSITIVE     Antibody screen NEG    GLUCOSE, POC    Collection Time: 12/03/21 11:20 AM   Result Value Ref Range    Glucose (POC) 275 (H) 65 - 117 mg/dL    Performed by Skeet Simmer    GLUCOSE, POC    Collection Time: 12/03/21  2:36 PM   Result Value Ref Range    Glucose (POC) 104 65 - 117 mg/dL    Performed by Bonne Dolores    GLUCOSE, POC    Collection Time: 12/03/21  3:50 PM  Result Value Ref Range    Glucose (POC) 119 (H) 65 - 117 mg/dL    Performed by Sudie Bailey Early    CBC W/O DIFF    Collection Time: 12/03/21  6:14 PM   Result Value Ref Range    WBC 9.6 4.1 - 11.1 K/uL    RBC 5.65 4.10 - 5.70 M/uL    HGB 14.8 12.1 - 17.0 g/dL    HCT 46.2 36.6 - 50.3 %    MCV 81.8 80.0 - 99.0 FL    MCH 26.2 26.0 - 34.0 PG    MCHC 32.0 30.0 - 36.5 g/dL    RDW 14.6 (H) 11.5 - 14.5 %    PLATELET 186 150 - 400 K/uL    MPV 10.3 8.9 - 12.9 FL    NRBC 0.0 0 PER 100 WBC    ABSOLUTE NRBC 0.00 0.00 - 0.98 K/uL   METABOLIC PANEL, BASIC    Collection Time: 12/03/21  6:14 PM   Result Value Ref Range    Sodium 138 136 - 145 mmol/L    Potassium 4.4 3.5 - 5.1 mmol/L    Chloride 108 97 - 108 mmol/L    CO2 24 21 - 32 mmol/L    Anion gap 6 5 - 15 mmol/L    Glucose 157 (H) 65 - 100 mg/dL    BUN 17 6 - 20 MG/DL    Creatinine 1.07 0.70 - 1.30 MG/DL    BUN/Creatinine ratio 16 12 - 20      eGFR >60 >60 ml/min/1.74m    Calcium 8.5 8.5 - 10.1 MG/DL   MAGNESIUM    Collection Time: 12/03/21  6:14 PM   Result Value Ref Range    Magnesium 2.5 (H) 1.6 - 2.4 mg/dL   PHOSPHORUS    Collection Time: 12/03/21  6:14 PM   Result Value Ref Range    Phosphorus 3.9 2.6 - 4.7 MG/DL   METABOLIC PANEL, BASIC    Collection Time: 12/04/21  4:55 AM   Result Value Ref Range    Sodium 135 (L) 136 - 145 mmol/L    Potassium 4.6 3.5 - 5.1 mmol/L    Chloride 107 97 - 108 mmol/L    CO2 21 21 - 32 mmol/L    Anion gap 7 5 - 15 mmol/L    Glucose 147 (H) 65 - 100 mg/dL     BUN 16 6 - 20 MG/DL    Creatinine 0.84 0.70 - 1.30 MG/DL    BUN/Creatinine ratio 19 12 - 20      eGFR >60 >60 ml/min/1.712m   Calcium 8.6 8.5 - 10.1 MG/DL   MAGNESIUM    Collection Time: 12/04/21  4:55 AM   Result Value Ref Range    Magnesium 2.2 1.6 - 2.4 mg/dL   PHOSPHORUS    Collection Time: 12/04/21  4:55 AM   Result Value Ref Range    Phosphorus 4.0 2.6 - 4.7 MG/DL   CBC W/O DIFF    Collection Time: 12/04/21  4:55 AM   Result Value Ref Range    WBC 9.6 4.1 - 11.1 K/uL    RBC 5.68 4.10 - 5.70 M/uL    HGB 14.7 12.1 - 17.0 g/dL    HCT 47.2 36.6 - 50.3 %    MCV 83.1 80.0 - 99.0 FL    MCH 25.9 (L) 26.0 - 34.0 PG    MCHC 31.1 30.0 - 36.5 g/dL    RDW 14.9 (H) 11.5 - 14.5 %  PLATELET 192 150 - 400 K/uL    MPV 10.7 8.9 - 12.9 FL    NRBC 0.0 0 PER 100 WBC    ABSOLUTE NRBC 0.00 0.00 - 0.01 K/uL   GLUCOSE, POC    Collection Time: 12/04/21  6:49 AM   Result Value Ref Range    Glucose (POC) 131 (H) 65 - 117 mg/dL    Performed by Irish Elders            Assessment:     Principal Problem:    Fecal incontinence (12/03/2021)    Active Problems:    Type II diabetes mellitus ([place]) ()      Hypertension ()        1 Day Post-Op s/p hand-assisted laparoscopic crration of divided loop colostomy.    Hemodynamically stable.  Hemoglobin stable.  Urine output adequate.  Creatinine within normal limits.        Plan:   Begin low fiber diabetic diet with caution.  Nutritional supplements.  Diabetes Management consultation.  Remove Foley catheter per protocol.  Ambulate.  Physical therapy.  Incentive spirometer.  Ostomy education.

## 2021-12-04 NOTE — Progress Notes (Signed)
 RUR: 5% Low    TOC: Anticipated discharge home. Patient's sister will provide transport home once medically stable. Follow-up with PCP/specialist.     Primary Contact: Wife, Karima Kirkendall, [phone]    *Will need 2nd IM letter prior to DC.     Care Management Interventions  PCP Verified by CM: Yes  Mode of Transport at Discharge: Other (see comment)  Transition of Care Consult (CM Consult): Discharge Planning  Discharge Durable Medical Equipment: No  Physical Therapy Consult: Yes  Occupational Therapy Consult: No  Speech Therapy Consult: No  Support Systems: Spouse/Significant Other  Confirm Follow Up Transport: Self  The Plan for Transition of Care is Related to the Following Treatment Goals : Home  Discharge Location  Patient Expects to be Discharged to:: Home with family assistance    Reason for Admission:  Fecal incontinence                    RUR Score:  5% Low                    Plan for utilizing home health:  Likely none       PCP: First and Last name:  Other, Phys, MD     Name of Practice:    Are you a current patient: Yes/No: Yes   Approximate date of last visit: virtual appointment 1 month ago   Can you participate in a virtual visit with your PCP: Yes                    Current Advanced Directive/Advance Care Plan: Full Code    Healthcare Decision Maker:   Click here to complete HealthCare Decision Makers including selection of the Healthcare Decision Maker Relationship (ie Primary)                  Transition of Care Plan:     Home     CM met with patient at bedside to introduce self and explain role. Patient lives with his wife in a 2 story home with 4 steps to enter and 14 steps to enter the 2nd floor. Patient has a Advice worker bed/bath set-up. Patient was independent at baseline with ADL's, IADL's and ambulation. Patient does not own any DME. CM verified patient's PCP, demographics and insurance. Preferred pharmacy is in [place], [place]; however, patient prefers to use [facility]  outpatient pharmacy for prescribed medications during admission. Patient's sister will provide transport home once medically stable.     Alan Sheldon, MSW   [phone]

## 2021-12-04 NOTE — Group Note (Signed)
Diabetes  Mgmt by Heinz Knuckles, CNS at 12/04/21 1329                Author: Heinz Knuckles, CNS  Service: Certified Clinical Nurse Specialist  Author Type: Clinical Nurse Specialist       Filed: 12/04/21 1432  Date of Service: 12/04/21 1329  Status: Signed          Editor: Heinz Knuckles, CNS (Clinical Nurse Specialist)               [place]   [facility] DIABETES HEALTH   DIABETES MANAGEMENT CONSULT      Consulted by  Theresia Majors, MD  for advanced nursing evaluation and care for inpatient blood glucose management.        Evaluation and Action Plan     Evan Shepherd is a 65 year old gentleman with controlled Type 2 Diabetes on high dose Lantus/Novolog, Jardiance and Metformin, admitted with uncontrolled fecal incontinence  now s/p Hand-assisted laparoscopic Creation of divided loop colostomy, Cystoscopy, bilateral ureteral catheter placement.  His A1C on admission  was at goal at 6.1% and patient diligent with diabetes management PTA.    This admission, all antihyperglycemic agents have been held.  He did have 4mg  decadron x1 in the OR without hyperglycemia.  His fasting BG is 147 but now 230 now that oral intake  has started. Basal insulin should resume, but would recommend starting with moderate weight based dosing/dose reduction.  I suspect dose will need to be adjusted based on pain level and insulin resistance.  When eating more than half his trays, would  be reasonable to resume bolus humalog- again likely with a dose reduction of intake here is less than his baseline eating habits prior to surgery.    Inpatient BG goal is 140-180mg /dl.          Management  Rationale  Action Plan       Medication         Basal needs  Using 0.3 units/kg/D based on obesity    and A1C  Lantus 35 units HS      If fasting BG over 180mg /dl, increase to 0.4 units/kg/day     Nutritional needs  Using moderate sensitivity  If eating more than 50% of meals and pre-prandial BG over 200, will start 8 units  Humalog/meal     Corrective insulin  Resistant sensitivity  Resistant Sensitivity ACHS       Recommendations:   1.  POC glucose ACHS      2.  Consistent carbohydrate diet (60 grams CHO/meal)                       Initial Presentation     Evan Shepherd is a 65 y.o. male admitted 12/03/21 for surgical management of chronic fecal incontinence.        HX:      Past Medical History:        Diagnosis  Date         ?  Chronic kidney disease, stage I       ?  Dyslipidemia       ?  Erectile dysfunction       ?  Fecal incontinence       ?  GERD (gastroesophageal reflux disease)       ?  History of colonic polyps       ?  History of rectal cancer  2006  Treated with neoadjuvant chemoradiation followed by surgery and then, presumably, adjuvant chemotherapy. The patient appears to have been cured.         ?  Hypertension           ?  Type II diabetes mellitus (HCC)              INITIAL DX:    Fecal incontinence [R15.9]         Current Treatment        TX: Hand-assisted laparoscopic Creation of divided loop colostomy, Cystoscopy, bilateral  ureteral catheter placement         Hospital Course     Clinical progress has been uncomplicated.    12/6: Hand-assisted laparoscopic Creation of divided loop colostomy, Cystoscopy, bilateral ureteral catheter placement      Diabetes History     Type 2 Diabetes: Diagnosed 20 years ago   Ambulatory BG management provided by: His engocrinologist in [place], [address] Talmage Coin   Family History: Positive for Type 2 Diabetes, Brother with DM2 (passed away at age 29)      Diabetes-related Medical History   Neurological complications   Impotence and Peripheral neuropathy   Microvascular disease   Nephropathy   Other associated conditions      Hx rectal cancer J-pouch to anal anastomosis and diverting loop ileostomy 2006; ileostomy closed 2007      Diabetes Medication History     Key Antihyperglycemic Medications                                empagliflozin (JARDIANCE) 25 mg tablet   (Taking)  Take 25 mg by mouth daily.       insulin glargine (LANTUS,BASAGLAR) 100 unit/mL (3 mL) inpn  (Taking)  52 Units by SubCUTAneous route nightly.           insulin aspart U-100 (NOVOLOG) 100 unit/mL (3 mL) inpn  (Taking)  by SubCUTAneous route Before breakfast, lunch, and dinner. 16 UNITS AT BREAKFAST, 25 UNITS AT LUNCH, 30 UNITS WITH SUPPER                      Diabetes self-management practices:   Eating pattern     Breakfast  Toast- 2 pieces and  tea.  2 hrs later will have 2 eggs    Lunch    Sandwich or Pita with curry    Dinner   Pita with rice and curry    Beverages  Water, tea   Physical activity pattern    Sedentary.  Mostly sits at a desk- lots of activity would exacerbate incontinence.    Monitoring pattern    Started to wear a dexcom G6 4 mos ago    May have a fasting low 1-2 times a month (as low as 47)- otherwise fasting is 110-120    May have a low during the day "sometimes"- he attributes to taking his mealtime novolog too early before eating    Daytime BG under 160s   Taking medications pattern    Consistent administration    Affordable   Social determinants of health impacting diabetes self-management practices    Concerned that you need to know more about how to stay healthy with diabetes      Overall evaluation:       Achieving A1c target with drug therapy & self-care practices      History includes: rectal cancer with J-pouch to  anal anastomosis and diverting loop ileostomy 2006; ileostomy closed 2007; , and he had good function for the first one or two years after the operation. Gradually, he experienced increasing problems  with fecal incontinence. Plan to leave the j-pouch in situ.      Subjective     I feel comfortable with paying close to my numbers and adjusting insulin if I need to.         Objective     Physical exam   General Obese male in acute post-op pain. Conversant and cooperative   Neuro  Alert, oriented    Vital Signs Visit Vitals      BP  117/69      Pulse  85     Temp  98.1 ??F (36.7 ??C)     Resp  20     Ht  5\' 10"  (1.778 m)     Wt  102.5 kg (225 lb 15.5 oz)     SpO2  91%        BMI  32.42 kg/m??        Skin  Warm and dry. Acanthosis noted along neckline. No lipohypertrophy or lipoatrophy noted at injection sites. Abdominal scars (healed).  New LLQ colostomy with laparoscopic incisions. Abdomen tender and a little swollen from surgery     Heart   Regular rate and rhythm. No murmurs, rubs or gallops   Lungs  Clear to auscultation without rales or rhonchi   Extremities No foot wounds           Laboratory     Recent Labs            12/04/21   0455  12/03/21   1814     GLU  147*  157*     AGAP  7  6     WBC  9.6  9.6         CREA  0.84  1.07           Factors impacting BG management       Factor  Dose  Comments         Nutrition:   Standard meals        60 grams/meal   Low fat/fiber   Eating less than half his trays           Drugs:      Steroids           Decadron 4mg          Impairs insulin action         Pain             Other:    Kidney function     GFR over 60          Blood glucose pattern        Significant diabetes-related events over the past 24-72 hours   A1C 6.1%   Fasting BG: 131   Pre-prandial: 157-230   Correction: 3 units   4mg  decadron x1 1258           Assessment and Nursing Intervention        Nursing Diagnosis  Risk for unstable blood glucose pattern     Nursing Intervention Domain  5250 Decision-making Support        Nursing Interventions  Examined current inpatient diabetes/blood glucose control    Explored factors facilitating and impeding inpatient management   Explored corrective strategies with patient and responsible inpatient provider    Informed patient  of rational for insulin strategy while hospitalized           Nursing Diagnosis  00078 Ineffective Health Management        Nursing Intervention Domain  5250 Decision-making Support        Nursing Interventions  Identified diabetes self-management practices impeding diabetes control    Discussed diabetes survival skills related to   1.  Healthy Plate eating plan; given handouts   2.  Role of physical activity in improving insulin sensitivity and action   3.  Procedure for blood glucose monitoring & options for low-cost products   4.  Medications plan at discharge          Billing Code(s)     [x]  99233/99356       Before making these care recommendations, I personally reviewed the hospitalization record, including notes, laboratory & diagnostic data and current medications, and examined the patient at  the bedside (circumstances permitting) before making care recommendations. More than fifty (50) percent of the time was spent in patient counseling and/or care coordination.   Total minutes: 65      06-25-1985, CNS   Diabetes Clinical Nurse Specialist   Program [facility] Diabetes Health   Access via Perfect Serve

## 2021-12-04 NOTE — Progress Notes (Signed)
The following SGLT2 inhibitor has been discontinued per [employer] approved policy:  Empagliflozin 25 mg daily     Please reorder upon discharge if appropriate.

## 2021-12-05 LAB — CBC WITH AUTOMATED DIFF
ABS. BASOPHILS: 0 10*3/uL (ref 0.0–0.1)
ABS. EOSINOPHILS: 0 10*3/uL (ref 0.0–0.4)
ABS. IMM. GRANS.: 0 10*3/uL (ref 0.00–0.04)
ABS. LYMPHOCYTES: 1.8 10*3/uL (ref 0.8–3.5)
ABS. MONOCYTES: 0.6 10*3/uL (ref 0.0–1.0)
ABS. NEUTROPHILS: 6.3 10*3/uL (ref 1.8–8.0)
ABSOLUTE NRBC: 0 10*3/uL (ref 0.00–0.01)
BASOPHILS: 0 % (ref 0–1)
EOSINOPHILS: 0 % (ref 0–7)
HCT: 44.1 % (ref 36.6–50.3)
HGB: 14 g/dL (ref 12.1–17.0)
IMMATURE GRANULOCYTES: 0 % (ref 0.0–0.5)
LYMPHOCYTES: 21 % (ref 12–49)
MCH: 25.9 PG — ABNORMAL LOW (ref 26.0–34.0)
MCHC: 31.7 g/dL (ref 30.0–36.5)
MCV: 81.5 FL (ref 80.0–99.0)
MONOCYTES: 7 % (ref 5–13)
MPV: 10.7 FL (ref 8.9–12.9)
NEUTROPHILS: 72 % (ref 32–75)
NRBC: 0 PER 100 WBC
PLATELET: 175 10*3/uL (ref 150–400)
RBC: 5.41 M/uL (ref 4.10–5.70)
RDW: 14.8 % — ABNORMAL HIGH (ref 11.5–14.5)
WBC: 8.7 10*3/uL (ref 4.1–11.1)

## 2021-12-05 LAB — METABOLIC PANEL, BASIC
Anion gap: 6 mmol/L (ref 5–15)
BUN/Creatinine ratio: 20 (ref 12–20)
BUN: 18 MG/DL (ref 6–20)
CO2: 25 mmol/L (ref 21–32)
Calcium: 8.4 MG/DL — ABNORMAL LOW (ref 8.5–10.1)
Chloride: 103 mmol/L (ref 97–108)
Creatinine: 0.91 MG/DL (ref 0.70–1.30)
Glucose: 157 mg/dL — ABNORMAL HIGH (ref 65–100)
Potassium: 4.5 mmol/L (ref 3.5–5.1)
Sodium: 134 mmol/L — ABNORMAL LOW (ref 136–145)
eGFR: >60 mL/min/{1.73_m2} (ref >60)

## 2021-12-05 LAB — GLUCOSE, POC
Glucose (POC): 141 mg/dL — ABNORMAL HIGH (ref 65–117)
Glucose (POC): 109 mg/dL (ref 65–117)
Glucose (POC): 120 mg/dL — ABNORMAL HIGH (ref 65–117)
Glucose (POC): 158 mg/dL — ABNORMAL HIGH (ref 65–117)
Glucose (POC): 160 mg/dL — ABNORMAL HIGH (ref 65–117)

## 2021-12-05 LAB — CBC WITH AUTO DIFFERENTIAL
Basophils %: 0 % (ref 0–1)
Basophils Absolute: 0 10*3/uL (ref 0.0–0.1)
Eosinophils %: 0 % (ref 0–7)
Eosinophils Absolute: 0 10*3/uL (ref 0.0–0.4)
Granulocyte Absolute Count: 0 10*3/uL (ref 0.00–0.04)
Hematocrit: 44.1 % (ref 36.6–50.3)
Hemoglobin: 14 g/dL (ref 12.1–17.0)
Immature Granulocytes %: 0 % (ref 0.0–0.5)
Lymphocytes %: 21 % (ref 12–49)
Lymphocytes Absolute: 1.8 10*3/uL (ref 0.8–3.5)
MCH: 25.9 PG — ABNORMAL LOW (ref 26.0–34.0)
MCHC: 31.7 g/dL (ref 30.0–36.5)
MCV: 81.5 FL (ref 80.0–99.0)
MPV: 10.7 FL (ref 8.9–12.9)
Monocytes %: 7 % (ref 5–13)
Monocytes Absolute: 0.6 10*3/uL (ref 0.0–1.0)
NRBC Absolute: 0 10*3/uL (ref 0.00–0.01)
Neutrophils %: 72 % (ref 32–75)
Neutrophils Absolute: 6.3 10*3/uL (ref 1.8–8.0)
Nucleated RBCs: 0 PER 100 WBC
Platelets: 175 10*3/uL (ref 150–400)
RBC: 5.41 M/uL (ref 4.10–5.70)
RDW: 14.8 % — ABNORMAL HIGH (ref 11.5–14.5)
WBC: 8.7 10*3/uL (ref 4.1–11.1)

## 2021-12-05 LAB — POCT GLUCOSE
POC Glucose: 141 mg/dL — ABNORMAL HIGH (ref 65–117)
POC Glucose: 109 mg/dL (ref 65–117)
POC Glucose: 120 mg/dL — ABNORMAL HIGH (ref 65–117)
POC Glucose: 158 mg/dL — ABNORMAL HIGH (ref 65–117)
POC Glucose: 160 mg/dL — ABNORMAL HIGH (ref 65–117)

## 2021-12-05 LAB — BASIC METABOLIC PANEL
Anion Gap: 6 mmol/L (ref 5–15)
BUN/Creatinine Ratio: 20 (ref 12–20)
BUN: 18 MG/DL (ref 6–20)
CO2: 25 mmol/L (ref 21–32)
Calcium: 8.4 MG/DL — ABNORMAL LOW (ref 8.5–10.1)
Chloride: 103 mmol/L (ref 97–108)
Creatinine: 0.91 MG/DL (ref 0.70–1.30)
Est, Glom Filt Rate: >60 mL/min/{1.73_m2} (ref >60)
Glucose: 157 mg/dL — ABNORMAL HIGH (ref 65–100)
Potassium: 4.5 mmol/L (ref 3.5–5.1)
Sodium: 134 mmol/L — ABNORMAL LOW (ref 136–145)

## 2021-12-05 MED ORDER — CELECOXIB 100 MG CAP
100 mg | Freq: Two times a day (BID) | ORAL | Status: AC
Start: 2021-12-05 — End: 2021-12-10
  Administered 2021-12-05 – 2021-12-10 (×11): via ORAL

## 2021-12-05 MED FILL — ACETAMINOPHEN 500 MG TAB: 500 mg | ORAL | Qty: 2

## 2021-12-05 MED FILL — OXYCODONE 5 MG TAB: 5 mg | ORAL | Qty: 1

## 2021-12-05 MED FILL — ENOXAPARIN 40 MG/0.4 ML SUB-Q SYRINGE: 40 mg/0.4 mL | SUBCUTANEOUS | Qty: 0.4

## 2021-12-05 MED FILL — ONDANSETRON 4 MG TAB, RAPID DISSOLVE: 4 mg | ORAL | Qty: 1

## 2021-12-05 MED FILL — LIDOCAINE 8 MG/ML INFUSION: 8 mg/mL (0. %) | INTRAVENOUS | Qty: 250

## 2021-12-05 MED FILL — GABAPENTIN 300 MG CAP: 300 mg | ORAL | Qty: 1

## 2021-12-05 MED FILL — INSULIN LISPRO 100 UNIT/ML INJECTION: 100 unit/mL | SUBCUTANEOUS | Qty: 1

## 2021-12-05 MED FILL — LISINOPRIL 20 MG TAB: 20 mg | ORAL | Qty: 2

## 2021-12-05 MED FILL — LABETALOL 100 MG TAB: 100 mg | ORAL | Qty: 1

## 2021-12-05 MED FILL — INSULIN GLARGINE 100 UNIT/ML INJECTION: 100 unit/mL | SUBCUTANEOUS | Qty: 1

## 2021-12-05 MED FILL — CELECOXIB 100 MG CAP: 100 mg | ORAL | Qty: 1

## 2021-12-05 MED FILL — MOVANTIK 12.5 MG TABLET: 12.5 mg | ORAL | Qty: 1

## 2021-12-05 MED FILL — PANTOPRAZOLE 40 MG TAB, DELAYED RELEASE: 40 mg | ORAL | Qty: 1

## 2021-12-05 MED FILL — AMLODIPINE 5 MG TAB: 5 mg | ORAL | Qty: 2

## 2021-12-05 NOTE — Progress Notes (Signed)
General Daily Progress Note    Admission Date: 12/03/2021  Hospital Day 3  Post-Op Day 2  Subjective:   Good pain control.  No nausea this morning.  Has ambulated.  Voiding.    Objective:   Patient Vitals for the past 24 hrs:   BP Temp Pulse Resp SpO2   12/05/21 0804 130/70 98.4 ??F (36.9 ??C) 84 18 93 %   12/05/21 0000 122/76 97.9 ??F (36.6 ??C) 77 20 95 %   12/04/21 1524 116/88 98.4 ??F (36.9 ??C) 82 19 93 %     No intake/output data recorded.  12/06 1901 - 12/08 0700  In: 300 [P.O.:300]  Out: 2290 [Urine:2275]      Physical Examination:  General Appearance - No distress.   Abdomen - Somewhat distended.  Appropriately tender.  Incisions clean, dry, intact, and with ecchymosis but no abnormal erythema.  Colostomy edematous but viable.  Extremities -  Pneumatic compression stockings in use.  No calf tenderness.            Data Review   Recent Results (from the past 24 hour(s))   GLUCOSE, POC    Collection Time: 12/04/21 11:59 AM   Result Value Ref Range    Glucose (POC) 230 (H) 65 - 117 mg/dL    Performed by [place], POC    Collection Time: 12/04/21  4:09 PM   Result Value Ref Range    Glucose (POC) 186 (H) 65 - 117 mg/dL    Performed by Eli Hose    GLUCOSE, POC    Collection Time: 12/04/21  8:52 PM   Result Value Ref Range    Glucose (POC) 141 (H) 65 - 117 mg/dL    Performed by [facility]    CBC WITH AUTOMATED DIFF    Collection Time: 12/05/21  2:14 AM   Result Value Ref Range    WBC 8.7 4.1 - 11.1 K/uL    RBC 5.41 4.10 - 5.70 M/uL    HGB 14.0 12.1 - 17.0 g/dL    HCT 44.1 36.6 - 50.3 %    MCV 81.5 80.0 - 99.0 FL    MCH 25.9 (L) 26.0 - 34.0 PG    MCHC 31.7 30.0 - 36.5 g/dL    RDW 14.8 (H) 11.5 - 14.5 %    PLATELET 175 150 - 400 K/uL    MPV 10.7 8.9 - 12.9 FL    NRBC 0.0 0 PER 100 WBC    ABSOLUTE NRBC 0.00 0.00 - 0.01 K/uL    NEUTROPHILS 72 32 - 75 %    LYMPHOCYTES 21 12 - 49 %    MONOCYTES 7 5 - 13 %    EOSINOPHILS 0 0 - 7 %    BASOPHILS 0 0 - 1 %    IMMATURE GRANULOCYTES 0 0.0 - 0.5 %    ABS.  NEUTROPHILS 6.3 1.8 - 8.0 K/UL    ABS. LYMPHOCYTES 1.8 0.8 - 3.5 K/UL    ABS. MONOCYTES 0.6 0.0 - 1.0 K/UL    ABS. EOSINOPHILS 0.0 0.0 - 0.4 K/UL    ABS. BASOPHILS 0.0 0.0 - 0.1 K/UL    ABS. IMM. GRANS. 0.0 0.00 - 0.04 K/UL    DF AUTOMATED     METABOLIC PANEL, BASIC    Collection Time: 12/05/21  2:14 AM   Result Value Ref Range    Sodium 134 (L) 136 - 145 mmol/L    Potassium 4.5 3.5 - 5.1 mmol/L  Chloride 103 97 - 108 mmol/L    CO2 25 21 - 32 mmol/L    Anion gap 6 5 - 15 mmol/L    Glucose 157 (H) 65 - 100 mg/dL    BUN 18 6 - 20 MG/DL    Creatinine 0.91 0.70 - 1.30 MG/DL    BUN/Creatinine ratio 20 12 - 20      eGFR >60 >60 ml/min/1.76m    Calcium 8.4 (L) 8.5 - 10.1 MG/DL   GLUCOSE, POC    Collection Time: 12/05/21  7:29 AM   Result Value Ref Range    Glucose (POC) 120 (H) 65 - 117 mg/dL    Performed by [facility]    Collection Time: 12/05/21  7:33 AM   Result Value Ref Range    Glucose (POC) 109 65 - 117 mg/dL    Performed by [facility]           Assessment:     Principal Problem:    Fecal incontinence (12/03/2021)    Active Problems:    Type II diabetes mellitus (HCC) ()      Hypertension ()        2 Days Post-Op s/p hand-assisted laparoscopic crration of divided loop colostomy.     Hemodynamically stable.  Hemoglobin stable.  Urine output adequate.  Creatinine within normal limits.    Diabetes Management assistance appreciated.    Awaiting return of GI function.      Plan:   Continue low fiber diabetic diet.  Nutritional supplements.  Ambulate.  Physical therapy.  Incentive spirometer.  Ostomy education.

## 2021-12-05 NOTE — Progress Notes (Signed)
Patient has own personal machine to take blood sugars, this AM read 169 consistently but the hospital glucometer read 129 then 109. Patent's skin is cool. He prefers to have his glucose checked via his machine and not the glucometer for accuchecks    1130: Diabetes management assessed patient, family at bedside. Stated patient needs to use hospital glucometer, his dexacom is on the back of his arm when it works the best on the abdomen. Patient agreed to use hospital glucometer.

## 2021-12-05 NOTE — Group Note (Signed)
Diabetes Mgmt by Florian Buff, CNS at 12/05/21 1117                Author: Florian Buff, CNS  Service: Certified Clinical Nurse Specialist  Author Type: Clinical Nurse Specialist       Filed: 12/05/21 1145  Date of Service: 12/05/21 1117  Status: Signed          Editor: Florian Buff, CNS (Clinical Nurse Specialist)               [place]   [facility]GRAM FOR DIABETES HEALTH   DIABETES MANAGEMENT CONSULT      Consulted by  Theresia Majors, MD  for advanced nursing evaluation and care for inpatient blood glucose management.        Evaluation and Action Plan     Evan Shepherd is a 65 year old gentleman with controlled Type 2 Diabetes on high dose Lantus/Novolog, Jardiance and Metformin, admitted with uncontrolled fecal incontinence  now s/p Hand-assisted laparoscopic Creation of divided loop colostomy, Cystoscopy, bilateral ureteral catheter placement.  His A1C on admission  was at goal at 6.1% and patient diligent with diabetes management PTA.    This admission, all antihyperglycemic agents have been held.  He did have 4mg  decadron x1 in the OR without hyperglycemia.  His fasting BG is 147 but now 230 now that oral intake  has started. Basal insulin should resume, but would recommend starting with moderate weight based dosing/dose reduction.  I suspect dose will need to be adjusted based on pain level and insulin resistance.  When eating more than half his trays, would  be reasonable to resume bolus humalog- again likely with a dose reduction of intake here is less than his baseline eating habits prior to surgery.    Inpatient BG goal is 140-180mg /dl.      BG trends overnight stable and in target this AM with ordered nightly basal insulin-Continue with current basal/correctional insulin regimen.  Noted he has not eaten today.           Management  Rationale  Action Plan       Medication         Basal needs  Using 0.3 units/kg/D based on obesity    and A1C  CONTINUE Lantus 35 units  HS-IF BG starts to trend >180 (via our POC checks) advance basal insulin to 40 units daily      If fasting BG over 180mg /dl, increase to 0.4 units/kg/day     Nutritional needs  Using moderate sensitivity  If eating more than 50% of meals and pre-prandial BG over 200, will start 8 units Humalog/meal     Corrective insulin  Resistant sensitivity  Resistant Sensitivity ACHS       Recommendations:   1.  POC glucose ACHS; patient may use his CGM but RN staff still needs to record POC accuchecks and base treatment off those results vs CGM  results.      2.  Consistent carbohydrate diet (60 grams CHO/meal)                       Initial Presentation     Evan Shepherd is a 65 y.o. male admitted 12/03/21 for surgical management of chronic fecal incontinence.        HX:      Past Medical History:        Diagnosis  Date         ?  Chronic kidney  disease, stage I       ?  Dyslipidemia       ?  Erectile dysfunction       ?  Fecal incontinence       ?  GERD (gastroesophageal reflux disease)       ?  History of colonic polyps       ?  History of rectal cancer  2006          Treated with neoadjuvant chemoradiation followed by surgery and then, presumably, adjuvant chemotherapy. The patient appears to have been cured.         ?  Hypertension           ?  Type II diabetes mellitus (HCC)              INITIAL DX:    Fecal incontinence [R15.9]         Current Treatment        TX: Hand-assisted laparoscopic Creation of divided loop colostomy, Cystoscopy, bilateral  ureteral catheter placement         Hospital Course     Clinical progress has been uncomplicated.    12/6: Hand-assisted laparoscopic Creation of divided loop colostomy, Cystoscopy, bilateral ureteral catheter placement      Diabetes History     Type 2 Diabetes: Diagnosed 20 years ago   Ambulatory BG management provided by: His engocrinologist in [place], [address] Talmage Coin   Family History: Positive for Type 2 Diabetes, Brother with DM2 (passed away at age 55)       Diabetes-related Medical History   Neurological complications   Impotence and Peripheral neuropathy   Microvascular disease   Nephropathy   Other associated conditions      Hx rectal cancer J-pouch to anal anastomosis and diverting loop ileostomy 2006; ileostomy closed 2007      Diabetes Medication History     Key Antihyperglycemic Medications                                empagliflozin (JARDIANCE) 25 mg tablet  (Taking)  Take 25 mg by mouth daily.       insulin glargine (LANTUS,BASAGLAR) 100 unit/mL (3 mL) inpn  (Taking)  52 Units by SubCUTAneous route nightly.           insulin aspart U-100 (NOVOLOG) 100 unit/mL (3 mL) inpn  (Taking)  by SubCUTAneous route Before breakfast, lunch, and dinner. 16 UNITS AT BREAKFAST, 25 UNITS AT LUNCH, 30 UNITS WITH SUPPER                      Diabetes self-management practices:   Eating pattern     Breakfast  Toast- 2 pieces and  tea.  2 hrs later will have 2 eggs    Lunch    Sandwich or Pita with curry    Dinner   Pita with rice and curry    Beverages  Water, tea   Physical activity pattern    Sedentary.  Mostly sits at a desk- lots of activity would exacerbate incontinence.    Monitoring pattern    Started to wear a dexcom G6 4 mos ago    May have a fasting low 1-2 times a month (as low as 47)- otherwise fasting is 110-120    May have a low during the day "sometimes"- he attributes to taking his mealtime novolog too early before eating  Daytime BG under 160s   Taking medications pattern-Dexcom CGM (currently on left upper arm)    Consistent administration    Affordable   Social determinants of health impacting diabetes self-management practices    Concerned that you need to know more about how to stay healthy with diabetes      Overall evaluation:       Achieving A1c target with drug therapy & self-care practices      History includes: rectal cancer with J-pouch to anal anastomosis and diverting loop ileostomy 2006; ileostomy closed 2007; , and he had good  function for the first one or two years after the operation. Gradually, he experienced increasing problems  with fecal incontinence. Plan to leave the j-pouch in situ.      Subjective     I feel comfortable with paying close to my numbers and adjusting insulin if I need to.      Has dexcom on while hospitalized, he voiced concern over the discrepancy in his CGM results vs. Our POC results-    I check POC BG prior to lunch and compared to CGM, POC 158 vs. CGM 219.  Wants his basal insulin increased to 40 units for tonight due to he is afraid his BG will get out of control. I discussed the inpatient BG targets while in hospital is 140-180         Objective     Physical exam   General Obese male in acute post-op pain. Conversant and cooperative   Neuro  Alert, oriented    Vital Signs Visit Vitals      BP  130/70 (BP 1 Location: Right upper arm, BP Patient Position: Sitting)     Pulse  84     Temp  98.4 ??F (36.9 ??C)     Resp  18     Ht   (1.778 m)     Wt  102.5 kg (225 lb 15.5 oz)     SpO2  93%        BMI  32.42 kg/m??        Skin  Warm and dry. Acanthosis noted along neckline. No lipohypertrophy or lipoatrophy noted at injection sites. Abdominal scars (healed).  New LLQ colostomy with laparoscopic incisions. Abdomen tender and a little swollen from surgery     Heart   Regular rate and rhythm. No murmurs, rubs or gallops   Lungs  Clear to auscultation without rales or rhonchi   Extremities No foot wounds           Laboratory     Recent Labs             12/05/21   0214  12/04/21   0455  12/03/21   1814          GLU  157*  147*  157*     AGAP  WBC  8.7  9.6  9.6          CREA  0.91  0.84  1.07              Factors impacting BG management       Factor  Dose  Comments         Nutrition:   Standard meals   Oral diabetes supplement ordered     60 grams/meal   Low fat/fiber   Eating less than half his trays  Drugs:      Steroids           Decadron 4mg          Impairs insulin action         Pain              Other:    Kidney function     GFR over 60          Blood glucose pattern        Significant diabetes-related events over the past 24-72 hours   A1C 6.1%   Fasting BG: 120   Pre-prandial: 157-230   Correction: 6 units in past 24h   4mg  decadron x1 OR   Basal: 35 units Lantus QHS   Correction: resistant sensitivity           Assessment and Nursing Intervention        Nursing Diagnosis  Risk for unstable blood glucose pattern     Nursing Intervention Domain  5250 Decision-making Support        Nursing Interventions  Examined current inpatient diabetes/blood glucose control    Explored factors facilitating and impeding inpatient management   Explored corrective strategies with patient and responsible inpatient provider    Informed patient of rational for insulin strategy while hospitalized           Nursing Diagnosis  00078 Ineffective Health Management        Nursing Intervention Domain  5250 Decision-making Support        Nursing Interventions  Identified diabetes self-management practices impeding diabetes control   Discussed diabetes survival skills related to   1.  Healthy Plate eating plan; given handouts   2.  Role of physical activity in improving insulin sensitivity and action   3.  Procedure for blood glucose monitoring & options for low-cost products   4.  Medications plan at discharge          Billing Code(s)     [x]  [phone]   Before making these care recommendations, I personally reviewed the hospitalization record, including notes, laboratory & diagnostic data and current medications, and examined the patient at  the bedside (circumstances permitting) before making care recommendations. More than fifty (50) percent of the time was spent in patient counseling and/or care coordination.   Total minutes: 30      10-22-2000, CNS   Diabetes Clinical Nurse Specialist   Program [facility] Diabetes Health   Access via Perfect Serve

## 2021-12-05 NOTE — Anesthesia Post-Procedure Evaluation (Signed)
Procedure(s):  HAND ASSISTED LAPAROSCOPIC CREATION OF DIVIDED LOOP COLOSTOMY (E R A S), CYSTOSCOPY WITH INSERTION BILATERAL URETERAL CATHETERS  Cystoscopy with Bilateral Ureteral stent placement.    general    Anesthesia Post Evaluation        Patient location during evaluation: PACU  Note status: Adequate.  Level of consciousness: responsive to verbal stimuli and sleepy but conscious  Pain management: satisfactory to patient  Airway patency: patent  Anesthetic complications: no  Cardiovascular status: acceptable  Respiratory status: acceptable  Hydration status: acceptable  Comments: +Post-Anesthesia Evaluation and Assessment    Patient: Evan Shepherd MRN: [id number]  SSN: [id number]   Date of Birth: November 18, 1956  Age: 65 y.o.  Sex: male          Cardiovascular Function/Vital Signs    BP 130/70 (BP 1 Location: Right upper arm, BP Patient Position: Sitting)    Pulse 84    Temp 36.9 ??C (98.4 ??F)    Resp 18    Ht 5\' 10"  (1.778 m)    Wt 102.5 kg (225 lb 15.5 oz)    SpO2 93%    BMI 32.42 kg/m??     Patient is status post Procedure(s):  HAND ASSISTED LAPAROSCOPIC CREATION OF DIVIDED LOOP COLOSTOMY (E R A S), CYSTOSCOPY WITH INSERTION BILATERAL URETERAL CATHETERS  Cystoscopy with Bilateral Ureteral stent placement.    Nausea/Vomiting: Controlled.    Postoperative hydration reviewed and adequate.    Pain:  Pain Scale 1: Numeric (0 - 10) (12/05/21 0000)  Pain Intensity 1: 2 (12/05/21 0000)   Managed.    Neurological Status:   Neuro (WDL): Within Defined Limits (12/04/21 0000)   At baseline.    Mental Status and Level of Consciousness: Arousable.    Pulmonary Status:   O2 Device: None (Room air) (12/05/21 0000)   Adequate oxygenation and airway patent.    Complications related to anesthesia: None    Post-anesthesia assessment completed. No concerns.    I have evaluated the patient and the patient is stable and ready to be discharged from PACU .    Signed By: 14/08/22, MD    12/05/2021        INITIAL Post-op Vital  signs:   Vitals Value Taken Time   BP 118/75 12/04/21 0000   Temp 36.8 ??C (98.3 ??F) 12/04/21 0000   Pulse 82 12/04/21 0036   Resp 24 12/04/21 0036   SpO2 91 % 12/04/21 0036   Vitals shown include unvalidated device data.

## 2021-12-06 ENCOUNTER — Ambulatory Visit: Payer: Medicare HMO | Admitting: Gastroenterology

## 2021-12-06 LAB — CBC WITH AUTOMATED DIFF
ABS. BASOPHILS: 0 10*3/uL (ref 0.0–0.1)
ABS. EOSINOPHILS: 0.2 10*3/uL (ref 0.0–0.4)
ABS. IMM. GRANS.: 0 10*3/uL (ref 0.00–0.04)
ABS. LYMPHOCYTES: 2 10*3/uL (ref 0.8–3.5)
ABS. MONOCYTES: 0.7 10*3/uL (ref 0.0–1.0)
ABS. NEUTROPHILS: 6 10*3/uL (ref 1.8–8.0)
ABSOLUTE NRBC: 0 10*3/uL (ref 0.00–0.01)
BASOPHILS: 0 % (ref 0–1)
EOSINOPHILS: 2 % (ref 0–7)
HCT: 44.6 % (ref 36.6–50.3)
HGB: 14.2 g/dL (ref 12.1–17.0)
IMMATURE GRANULOCYTES: 0 % (ref 0.0–0.5)
LYMPHOCYTES: 22 % (ref 12–49)
MCH: 25.3 PG — ABNORMAL LOW (ref 26.0–34.0)
MCHC: 31.8 g/dL (ref 30.0–36.5)
MCV: 79.4 FL — ABNORMAL LOW (ref 80.0–99.0)
MONOCYTES: 7 % (ref 5–13)
MPV: 10.4 FL (ref 8.9–12.9)
NEUTROPHILS: 69 % (ref 32–75)
NRBC: 0 PER 100 WBC
PLATELET: 178 10*3/uL (ref 150–400)
RBC: 5.62 M/uL (ref 4.10–5.70)
RDW: 14.5 % (ref 11.5–14.5)
WBC: 8.9 10*3/uL (ref 4.1–11.1)

## 2021-12-06 LAB — GLUCOSE, POC
Glucose (POC): 132 mg/dL — ABNORMAL HIGH (ref 65–117)
Glucose (POC): 143 mg/dL — ABNORMAL HIGH (ref 65–117)
Glucose (POC): 123 mg/dL — ABNORMAL HIGH (ref 65–117)
Glucose (POC): 144 mg/dL — ABNORMAL HIGH (ref 65–117)
Glucose (POC): 140 mg/dL — ABNORMAL HIGH (ref 65–117)

## 2021-12-06 LAB — PHOSPHORUS
Phosphorus: 3.1 MG/DL (ref 2.6–4.7)
Phosphorus: 3.1 MG/DL (ref 2.6–4.7)

## 2021-12-06 LAB — METABOLIC PANEL, COMPREHENSIVE
A-G Ratio: 0.7 — ABNORMAL LOW (ref 1.1–2.2)
ALT (SGPT): 20 U/L (ref 12–78)
AST (SGOT): 14 U/L — ABNORMAL LOW (ref 15–37)
Albumin: 2.8 g/dL — ABNORMAL LOW (ref 3.5–5.0)
Alk. phosphatase: 56 U/L (ref 45–117)
Anion gap: 6 mmol/L (ref 5–15)
BUN/Creatinine ratio: 18 (ref 12–20)
BUN: 16 MG/DL (ref 6–20)
Bilirubin, total: 0.6 MG/DL (ref 0.2–1.0)
CO2: 24 mmol/L (ref 21–32)
Calcium: 8.6 MG/DL (ref 8.5–10.1)
Chloride: 104 mmol/L (ref 97–108)
Creatinine: 0.87 MG/DL (ref 0.70–1.30)
Globulin: 4.2 g/dL — ABNORMAL HIGH (ref 2.0–4.0)
Glucose: 143 mg/dL — ABNORMAL HIGH (ref 65–100)
Potassium: 4.4 mmol/L (ref 3.5–5.1)
Protein, total: 7 g/dL (ref 6.4–8.2)
Sodium: 134 mmol/L — ABNORMAL LOW (ref 136–145)
eGFR: >60 mL/min/{1.73_m2} (ref >60)

## 2021-12-06 LAB — MAGNESIUM
Magnesium: 2.1 mg/dL (ref 1.6–2.4)
Magnesium: 2.1 mg/dL (ref 1.6–2.4)

## 2021-12-06 LAB — COMPREHENSIVE METABOLIC PANEL
ALT: 20 U/L (ref 12–78)
AST: 14 U/L — ABNORMAL LOW (ref 15–37)
Albumin/Globulin Ratio: 0.7 — ABNORMAL LOW (ref 1.1–2.2)
Albumin: 2.8 g/dL — ABNORMAL LOW (ref 3.5–5.0)
Alkaline Phosphatase: 56 U/L (ref 45–117)
Anion Gap: 6 mmol/L (ref 5–15)
BUN/Creatinine Ratio: 18 (ref 12–20)
BUN: 16 MG/DL (ref 6–20)
CO2: 24 mmol/L (ref 21–32)
Calcium: 8.6 MG/DL (ref 8.5–10.1)
Chloride: 104 mmol/L (ref 97–108)
Creatinine: 0.87 MG/DL (ref 0.70–1.30)
Est, Glom Filt Rate: >60 mL/min/{1.73_m2} (ref >60)
Globulin: 4.2 g/dL — ABNORMAL HIGH (ref 2.0–4.0)
Glucose: 143 mg/dL — ABNORMAL HIGH (ref 65–100)
Potassium: 4.4 mmol/L (ref 3.5–5.1)
Sodium: 134 mmol/L — ABNORMAL LOW (ref 136–145)
Total Bilirubin: 0.6 MG/DL (ref 0.2–1.0)
Total Protein: 7 g/dL (ref 6.4–8.2)

## 2021-12-06 LAB — CBC WITH AUTO DIFFERENTIAL
Basophils %: 0 % (ref 0–1)
Basophils Absolute: 0 10*3/uL (ref 0.0–0.1)
Eosinophils %: 2 % (ref 0–7)
Eosinophils Absolute: 0.2 10*3/uL (ref 0.0–0.4)
Granulocyte Absolute Count: 0 10*3/uL (ref 0.00–0.04)
Hematocrit: 44.6 % (ref 36.6–50.3)
Hemoglobin: 14.2 g/dL (ref 12.1–17.0)
Immature Granulocytes %: 0 % (ref 0.0–0.5)
Lymphocytes %: 22 % (ref 12–49)
Lymphocytes Absolute: 2 10*3/uL (ref 0.8–3.5)
MCH: 25.3 PG — ABNORMAL LOW (ref 26.0–34.0)
MCHC: 31.8 g/dL (ref 30.0–36.5)
MCV: 79.4 FL — ABNORMAL LOW (ref 80.0–99.0)
MPV: 10.4 FL (ref 8.9–12.9)
Monocytes %: 7 % (ref 5–13)
Monocytes Absolute: 0.7 10*3/uL (ref 0.0–1.0)
NRBC Absolute: 0 10*3/uL (ref 0.00–0.01)
Neutrophils %: 69 % (ref 32–75)
Neutrophils Absolute: 6 10*3/uL (ref 1.8–8.0)
Nucleated RBCs: 0 PER 100 WBC
Platelets: 178 10*3/uL (ref 150–400)
RBC: 5.62 M/uL (ref 4.10–5.70)
RDW: 14.5 % (ref 11.5–14.5)
WBC: 8.9 10*3/uL (ref 4.1–11.1)

## 2021-12-06 LAB — POCT GLUCOSE
POC Glucose: 132 mg/dL — ABNORMAL HIGH (ref 65–117)
POC Glucose: 143 mg/dL — ABNORMAL HIGH (ref 65–117)
POC Glucose: 123 mg/dL — ABNORMAL HIGH (ref 65–117)
POC Glucose: 144 mg/dL — ABNORMAL HIGH (ref 65–117)
POC Glucose: 140 mg/dL — ABNORMAL HIGH (ref 65–117)

## 2021-12-06 MED ORDER — PROMETHAZINE IN NS 25 MG/50 ML IV PIGGY BAG
25 mg/50 ml | Freq: Four times a day (QID) | INTRAVENOUS | Status: AC | PRN
Start: 2021-12-06 — End: 2021-12-10
  Administered 2021-12-06 – 2021-12-10 (×5): via INTRAVENOUS

## 2021-12-06 MED ORDER — SODIUM CHLORIDE 0.9 % IV
INTRAVENOUS | Status: AC
Start: 2021-12-06 — End: 2021-12-10
  Administered 2021-12-06 – 2021-12-09 (×8): via INTRAVENOUS

## 2021-12-06 MED ORDER — SODIUM CHLORIDE 0.9% BOLUS IV
0.9 % | Freq: Once | INTRAVENOUS | Status: AC
Start: 2021-12-06 — End: 2021-12-05
  Administered 2021-12-06: 02:00:00 via INTRAVENOUS

## 2021-12-06 MED FILL — AMLODIPINE 5 MG TAB: 5 mg | ORAL | Qty: 2

## 2021-12-06 MED FILL — SODIUM CHLORIDE 0.9 % IV: INTRAVENOUS | Qty: 1000

## 2021-12-06 MED FILL — LISINOPRIL 20 MG TAB: 20 mg | ORAL | Qty: 2

## 2021-12-06 MED FILL — ACETAMINOPHEN 500 MG TAB: 500 mg | ORAL | Qty: 2

## 2021-12-06 MED FILL — INSULIN LISPRO 100 UNIT/ML INJECTION: 100 unit/mL | SUBCUTANEOUS | Qty: 1

## 2021-12-06 MED FILL — PANTOPRAZOLE 40 MG TAB, DELAYED RELEASE: 40 mg | ORAL | Qty: 1

## 2021-12-06 MED FILL — ENOXAPARIN 40 MG/0.4 ML SUB-Q SYRINGE: 40 mg/0.4 mL | SUBCUTANEOUS | Qty: 0.4

## 2021-12-06 MED FILL — LABETALOL 100 MG TAB: 100 mg | ORAL | Qty: 1

## 2021-12-06 MED FILL — SODIUM CHLORIDE 0.9 % IV: INTRAVENOUS | Qty: 500

## 2021-12-06 MED FILL — PROMETHAZINE IN NS 25 MG/50 ML IV PIGGY BAG: 25 mg/50 ml | INTRAVENOUS | Qty: 50

## 2021-12-06 MED FILL — GABAPENTIN 300 MG CAP: 300 mg | ORAL | Qty: 1

## 2021-12-06 MED FILL — CELECOXIB 100 MG CAP: 100 mg | ORAL | Qty: 1

## 2021-12-06 MED FILL — INSULIN GLARGINE 100 UNIT/ML INJECTION: 100 unit/mL | SUBCUTANEOUS | Qty: 1

## 2021-12-06 MED FILL — OXYCODONE 5 MG TAB: 5 mg | ORAL | Qty: 1

## 2021-12-06 MED FILL — ONDANSETRON 4 MG TAB, RAPID DISSOLVE: 4 mg | ORAL | Qty: 1

## 2021-12-06 MED FILL — MOVANTIK 12.5 MG TABLET: 12.5 mg | ORAL | Qty: 1

## 2021-12-06 NOTE — Progress Notes (Signed)
RUR: 6% Low    TOC: Anticipated discharge home. Patient's sister will provide transport home once medically stable. Follow-up with PCP/specialist.      Primary Contact: Wife, Dalante Minus, [phone]    *Will need 2nd IM letter prior to DC.     CM noted HH SN order for new colostomy. CM unable to secure home health due to [place]. CM provided patient and wife HH SN order, H&P, wound care note and face sheet in order to provide to his PCP in [place] to arrange home health SN services. CM also faxed copies to patient's PCP office. Patient and wife provided fax confirmation sheet indicating fax was successfully sent to PCP. CM explained the above to patient and wife who expressed their appreciation. CM will continue to follow as needed.      Leodis Sias, MSW   [phone]

## 2021-12-06 NOTE — Group Note (Signed)
Diabetes  Mgmt by Heinz Knuckles, CNS at 12/06/21 1000                Author: Heinz Knuckles, CNS  Service: Certified Clinical Nurse Specialist  Author Type: Clinical Nurse Specialist       Filed: 12/06/21 1023  Date of Service: 12/06/21 1000  Status: Addendum          Editor: Heinz Knuckles, CNS (Clinical Nurse Specialist)          Related Notes: Original Note by Heinz Knuckles, CNS (Clinical Nurse Specialist) filed at 12/06/21  1022               [facility]   [facility]GRAM FOR DIABETES HEALTH   DIABETES MANAGEMENT CONSULT      Consulted by  Theresia Majors, MD  for advanced nursing evaluation and care for inpatient blood glucose management.        Evaluation and Action Plan     Zalmen Berisha is a 65 year old gentleman with controlled Type 2 Diabetes on high dose Lantus/Novolog, Jardiance and Metformin, admitted with uncontrolled fecal incontinence  now s/p Hand-assisted laparoscopic Creation of divided loop colostomy, Cystoscopy, bilateral ureteral catheter placement.  His A1C on admission  was at goal at 6.1% and patient diligent with diabetes management PTA.    This admission, all antihyperglycemic agents have been held.  He did have 4mg  decadron x1 in the OR without hyperglycemia.  His fasting BG is 147 but now 230 now that oral intake  has started. Basal insulin should resume, but would recommend starting with moderate weight based dosing/dose reduction.  I suspect dose will need to be adjusted based on pain level and insulin resistance.  When eating more than half his trays, would  be reasonable to resume bolus humalog- again likely with a dose reduction of intake here is less than his baseline eating habits prior to surgery.    Inpatient BG goal is 140-180mg /dl.      BG trends overnight stable and in target this AM with ordered nightly basal insulin-Continue with current basal/correctional insulin regimen.  Noted he has not eaten today.           Management  Rationale  Action Plan        Medication         Basal needs  Using 0.3 units/kg/D based on obesity    and A1C  CONTINUE Lantus 35 units HS-IF BG starts to trend >180 (via our hospital POC checks) advance basal insulin to 40 units daily        Nutritional needs  Using moderate sensitivity  If eating more than 50% of meals and pre-prandial BG over 200, start 8 units Humalog/meal     Corrective insulin  Resistant sensitivity  Resistant Sensitivity ACHS       Recommendations:   1.  POC glucose ACHS.   Ok for her to wear a continuous glucose monitor (CGM/Dexcom) but all hospital doses should be made off fingerstick glucose values (serum glucose).  CGMs measure  glucose in the interstitial space and there is a lag time bw interstitial glucose and blood glucose.  CGM results can also be impacted by fluid shifts and hypotension.      2.  Consistent carbohydrate diet (60 grams CHO/meal)      Will see again Monday if here.  Please PerfectServe with questions over the weekend.          Discharge Recommendations     1.  Will need a FUV with his endocrinologist per routine after hospital discharge for ongoing diabetes management.  He will reach out to endo for  questions and concerns for glucose pattern after hospital discharge.        2.  Reduce basal to hospital based dose on D/C      3.  Ok to resume Jardiance and Metformin at D/C if eating more than half his meals.      4.  Would recommend that patient reduces mealtime humalog by 50% on discharge home as he is eating less compared to PTA.                Initial Presentation     Elzy Tomasello is a 65 y.o. male admitted 12/03/21 for surgical management of chronic fecal incontinence.        HX:      Past Medical History:        Diagnosis  Date         ?  Chronic kidney disease, stage I       ?  Dyslipidemia       ?  Erectile dysfunction       ?  Fecal incontinence       ?  GERD (gastroesophageal reflux disease)       ?  History of colonic polyps       ?  History of rectal cancer  2006          Treated  with neoadjuvant chemoradiation followed by surgery and then, presumably, adjuvant chemotherapy. The patient appears to have been cured.         ?  Hypertension           ?  Type II diabetes mellitus (HCC)              INITIAL DX:    Fecal incontinence [R15.9]         Current Treatment        TX: Hand-assisted laparoscopic Creation of divided loop colostomy, Cystoscopy, bilateral  ureteral catheter placement         Hospital Course     Clinical progress has been uncomplicated.    12/6: Hand-assisted laparoscopic Creation of divided loop colostomy, Cystoscopy, bilateral ureteral catheter placement      Diabetes History     Type 2 Diabetes: Diagnosed 20 years ago   Ambulatory BG management provided by: His engocrinologist in [place], [address] Talmage Coin   Family History: Positive for Type 2 Diabetes, Brother with DM2 (passed away at age 68)      Diabetes-related Medical History   Neurological complications   Impotence and Peripheral neuropathy   Microvascular disease   Nephropathy   Other associated conditions      Hx rectal cancer J-pouch to anal anastomosis and diverting loop ileostomy 2006; ileostomy closed 2007      Diabetes Medication History     Key Antihyperglycemic Medications                                empagliflozin (JARDIANCE) 25 mg tablet  (Taking)  Take 25 mg by mouth daily.       insulin glargine (LANTUS,BASAGLAR) 100 unit/mL (3 mL) inpn  (Taking)  52 Units by SubCUTAneous route nightly.           insulin aspart U-100 (NOVOLOG) 100 unit/mL (3 mL) inpn  (Taking)  by SubCUTAneous route Before breakfast, lunch,  and dinner. 16 UNITS AT BREAKFAST, 25 UNITS AT LUNCH, 30 UNITS WITH SUPPER                      Diabetes self-management practices:   Eating pattern    [x]  Breakfast  Toast- 2 pieces and  tea.  2 hrs later will have 2 eggs   [x]  Lunch    Sandwich or Pita with curry   [x]  Dinner   Pita with rice and curry   [x]  Beverages  Water, tea   Physical activity pattern    Sedentary.  Mostly sits at a  desk- lots of activity would exacerbate incontinence.    Monitoring pattern    Started to wear a dexcom G6 4 mos ago    May have a fasting low 1-2 times a month (as low as 47)- otherwise fasting is 110-120    May have a low during the day "sometimes"- he attributes to taking his mealtime novolog too early before eating    Daytime BG under 160s   Taking medications pattern-Dexcom CGM (currently on left upper arm)   [x]  Consistent administration   [x]  Affordable   Social determinants of health impacting diabetes self-management practices    Concerned that you need to know more about how to stay healthy with diabetes      Overall evaluation:     [x]   Achieving A1c target with drug therapy & self-care practices      History includes: rectal cancer with J-pouch to anal anastomosis and diverting loop ileostomy 2006; ileostomy closed 2007; , and he had good function for the first one or two years after the operation. Gradually, he experienced increasing problems  with fecal incontinence. Plan to leave the j-pouch in situ.      Subjective     I feel comfortable with paying close to my numbers and adjusting insulin if I need to.         Objective     Physical exam   General Obese male in acute post-op pain. Conversant and cooperative   Neuro  Alert, oriented    Vital Signs Visit Vitals      BP  (!) 143/85     Pulse  (!) 55     Temp  98.4 ??F (36.9 ??C)     Resp  16     Ht  5\' 10"  (1.778 m)     Wt  102.5 kg (225 lb 15.5 oz)     SpO2  92%        BMI  32.42 kg/m??        Skin  Warm and dry. Acanthosis noted along neckline. No lipohypertrophy or lipoatrophy noted at injection sites. Abdominal scars (healed).  New LLQ colostomy with laparoscopic incisions. Abdomen tender and a little swollen from surgery     Heart   Regular rate and rhythm. No murmurs, rubs or gallops   Lungs  Clear to auscultation without rales or rhonchi   Extremities No foot wounds           Laboratory     Recent Labs             12/06/21   0443  12/05/21   0214   12/04/21   0455     GLU  143*  157*  147*     AGAP  6  6  7      WBC  8.9  8.7  9.6     CREA  0.87  0.91  0.84     AST  14*   --    --           ALT  20   --    --               Factors impacting BG management       Factor  Dose  Comments         Nutrition:   Standard meals   Oral diabetes supplement ordered     60 grams/meal   Low fat/fiber   Eating less than half his trays           Drugs:      Steroids           Decadron          Impairs insulin action         Pain             Other:    Kidney function     GFR over 60          Blood glucose pattern        Significant diabetes-related events over the past 24-72 hours   A1C 6.1%   Fasting BG: 123   Pre-prandial: 132-160   Correction: 6 units in past 24h    decadron x1 OR   Basal: 35 units Lantus QHS   Correction: resistant sensitivity   Some nausea and bloating yesterday, large yellow emesis just now.           Assessment and Nursing Intervention        Nursing Diagnosis  Risk for unstable blood glucose pattern     Nursing Intervention Domain  5250 Decision-making Support        Nursing Interventions  Examined current inpatient diabetes/blood glucose control    Explored factors facilitating and impeding inpatient management   Explored corrective strategies with patient and responsible inpatient provider    Informed patient of rational for insulin strategy while hospitalized           Nursing Diagnosis  00078 Ineffective Health Management        Nursing Intervention Domain  5250 Decision-making Support        Nursing Interventions  Identified diabetes self-management practices impeding diabetes control   Discussed diabetes survival skills related to   1.  Healthy Plate eating plan; given handouts   2.  Role of physical activity in improving insulin sensitivity and action   3.  Procedure for blood glucose monitoring & options for low-cost products   4.  Medications plan at discharge             Billing Code(s)      [phone]   Before making these care  recommendations, I personally reviewed the hospitalization record, including notes, laboratory & diagnostic data and current medications, and examined the patient at  the bedside (circumstances permitting) before making care recommendations. More than fifty (50) percent of the time was spent in patient counseling and/or care coordination.   Total minutes: 25      Heinz Knuckles, CNS   Diabetes Clinical Nurse Specialist   Program for Diabetes Health   Access via Perfect Serve

## 2021-12-06 NOTE — Progress Notes (Signed)
General Daily Progress Note    Admission Date: 12/03/2021  Hospital Day 4  Post-Op Day 3  Subjective:   Began having problems with nausea yesterday.  Paradoxically, Zofran seems to exacerbate them.  Phenergan has helped.  Good pain control.  Mostly feels bloated.  No colostomy output.      Objective:   Patient Vitals for the past 24 hrs:   BP Temp Pulse Resp SpO2   12/06/21 0803 (!) 143/85 98.4 ??F (36.9 ??C) (!) 55 16 92 %   12/05/21 2107 131/75 97.8 ??F (36.6 ??C) 75 18 92 %   12/05/21 1615 (!) 141/75 98 ??F (36.7 ??C) 84 18 91 %     No intake/output data recorded.  12/07 1901 - 12/09 0700  In: 1050 [P.O.:1050]  Out: 890 [Urine:850]    Physical Examination:  General Appearance - No distress.   Abdomen - More distended.  Appropriately tender.  Incisions clean, dry, intact, and with ecchymosis but no abnormal erythema.  Colostomy edematous but viable.  Extremities -  Pneumatic compression stockings in use.  No calf tenderness.             Data Review   Recent Results (from the past 24 hour(s))   GLUCOSE, POC    Collection Time: 12/05/21 11:35 AM   Result Value Ref Range    Glucose (POC) 158 (H) 65 - 117 mg/dL    Performed by [place], POC    Collection Time: 12/05/21  4:37 PM   Result Value Ref Range    Glucose (POC) 160 (H) 65 - 117 mg/dL    Performed by [place], POC    Collection Time: 12/05/21  9:08 PM   Result Value Ref Range    Glucose (POC) 132 (H) 65 - 117 mg/dL    Performed by Janne Lab    GLUCOSE, POC    Collection Time: 12/05/21 11:10 PM   Result Value Ref Range    Glucose (POC) 143 (H) 65 - 117 mg/dL    Performed by York Spaniel CON    CBC WITH AUTOMATED DIFF    Collection Time: 12/06/21  4:43 AM   Result Value Ref Range    WBC 8.9 4.1 - 11.1 K/uL    RBC 5.62 4.10 - 5.70 M/uL    HGB 14.2 12.1 - 17.0 g/dL    HCT 44.6 36.6 - 50.3 %    MCV 79.4 (L) 80.0 - 99.0 FL    MCH 25.3 (L) 26.0 - 34.0 PG    MCHC 31.8 30.0 - 36.5 g/dL    RDW 14.5 11.5 - 14.5 %    PLATELET 178 150 - 400  K/uL    MPV 10.4 8.9 - 12.9 FL    NRBC 0.0 0 PER 100 WBC    ABSOLUTE NRBC 0.00 0.00 - 0.01 K/uL    NEUTROPHILS 69 32 - 75 %    LYMPHOCYTES 22 12 - 49 %    MONOCYTES 7 5 - 13 %    EOSINOPHILS 2 0 - 7 %    BASOPHILS 0 0 - 1 %    IMMATURE GRANULOCYTES 0 0.0 - 0.5 %    ABS. NEUTROPHILS 6.0 1.8 - 8.0 K/UL    ABS. LYMPHOCYTES 2.0 0.8 - 3.5 K/UL    ABS. MONOCYTES 0.7 0.0 - 1.0 K/UL    ABS. EOSINOPHILS 0.2 0.0 - 0.4 K/UL    ABS. BASOPHILS 0.0 0.0 - 0.1 K/UL  ABS. IMM. GRANS. 0.0 0.00 - 0.04 K/UL    DF AUTOMATED     PHOSPHORUS    Collection Time: 12/06/21  4:43 AM   Result Value Ref Range    Phosphorus 3.1 2.6 - 4.7 MG/DL   METABOLIC PANEL, COMPREHENSIVE    Collection Time: 12/06/21  4:43 AM   Result Value Ref Range    Sodium 134 (L) 136 - 145 mmol/L    Potassium 4.4 3.5 - 5.1 mmol/L    Chloride 104 97 - 108 mmol/L    CO2 24 21 - 32 mmol/L    Anion gap 6 5 - 15 mmol/L    Glucose 143 (H) 65 - 100 mg/dL    BUN 16 6 - 20 MG/DL    Creatinine 0.87 0.70 - 1.30 MG/DL    BUN/Creatinine ratio 18 12 - 20      eGFR >60 >60 ml/min/1.65m    Calcium 8.6 8.5 - 10.1 MG/DL    Bilirubin, total 0.6 0.2 - 1.0 MG/DL    ALT (SGPT) 20 12 - 78 U/L    AST (SGOT) 14 (L) 15 - 37 U/L    Alk. phosphatase 56 45 - 117 U/L    Protein, total 7.0 6.4 - 8.2 g/dL    Albumin 2.8 (L) 3.5 - 5.0 g/dL    Globulin 4.2 (H) 2.0 - 4.0 g/dL    A-G Ratio 0.7 (L) 1.1 - 2.2     MAGNESIUM    Collection Time: 12/06/21  4:43 AM   Result Value Ref Range    Magnesium 2.1 1.6 - 2.4 mg/dL   GLUCOSE, POC    Collection Time: 12/06/21  7:12 AM   Result Value Ref Range    Glucose (POC) 123 (H) 65 - 117 mg/dL    Performed by BYork SpanielCON            Assessment:     Principal Problem:    Fecal incontinence (12/03/2021)    Active Problems:    Type II diabetes mellitus (HCC) ()      Hypertension ()        3 Days Post-Op s/p hand-assisted laparoscopic crration of divided loop colostomy.     Hemodynamically stable.  Hemoglobin stable.  Urine output adequate.  Creatinine within  normal limits.     Diabetes Management assistance appreciated.     Awaiting return of GI function.      Plan:   Continue low fiber diabetic diet with caution.  Nutritional supplements.  Ambulate.  Physical therapy.  Incentive spirometer.  Ostomy education.

## 2021-12-07 LAB — METABOLIC PANEL, BASIC
Anion gap: 5 mmol/L (ref 5–15)
BUN/Creatinine ratio: 21 — ABNORMAL HIGH (ref 12–20)
BUN: 17 MG/DL (ref 6–20)
CO2: 24 mmol/L (ref 21–32)
Calcium: 7.9 MG/DL — ABNORMAL LOW (ref 8.5–10.1)
Chloride: 108 mmol/L (ref 97–108)
Creatinine: 0.81 MG/DL (ref 0.70–1.30)
Glucose: 171 mg/dL — ABNORMAL HIGH (ref 65–100)
Potassium: 4 mmol/L (ref 3.5–5.1)
Sodium: 137 mmol/L (ref 136–145)
eGFR: >60 mL/min/{1.73_m2} (ref >60)

## 2021-12-07 LAB — CBC WITH AUTOMATED DIFF
ABS. BASOPHILS: 0 10*3/uL (ref 0.0–0.1)
ABS. EOSINOPHILS: 0.4 10*3/uL (ref 0.0–0.4)
ABS. IMM. GRANS.: 0 10*3/uL (ref 0.00–0.04)
ABS. LYMPHOCYTES: 1.9 10*3/uL (ref 0.8–3.5)
ABS. MONOCYTES: 0.5 10*3/uL (ref 0.0–1.0)
ABS. NEUTROPHILS: 3.8 10*3/uL (ref 1.8–8.0)
ABSOLUTE NRBC: 0 10*3/uL (ref 0.00–0.01)
BASOPHILS: 0 % (ref 0–1)
EOSINOPHILS: 6 % (ref 0–7)
HCT: 44.4 % (ref 36.6–50.3)
HGB: 14.1 g/dL (ref 12.1–17.0)
IMMATURE GRANULOCYTES: 0 % (ref 0.0–0.5)
LYMPHOCYTES: 29 % (ref 12–49)
MCH: 25.8 PG — ABNORMAL LOW (ref 26.0–34.0)
MCHC: 31.8 g/dL (ref 30.0–36.5)
MCV: 81.2 FL (ref 80.0–99.0)
MONOCYTES: 8 % (ref 5–13)
MPV: 11 FL (ref 8.9–12.9)
NEUTROPHILS: 57 % (ref 32–75)
NRBC: 0 PER 100 WBC
PLATELET: 173 10*3/uL (ref 150–400)
RBC: 5.47 M/uL (ref 4.10–5.70)
RDW: 14.5 % (ref 11.5–14.5)
WBC: 6.7 10*3/uL (ref 4.1–11.1)

## 2021-12-07 LAB — GLUCOSE, POC
Glucose (POC): 153 mg/dL — ABNORMAL HIGH (ref 65–117)
Glucose (POC): 156 mg/dL — ABNORMAL HIGH (ref 65–117)
Glucose (POC): 122 mg/dL — ABNORMAL HIGH (ref 65–117)
Glucose (POC): 125 mg/dL — ABNORMAL HIGH (ref 65–117)
Glucose (POC): 211 mg/dL — ABNORMAL HIGH (ref 65–117)
Glucose (POC): 177 mg/dL — ABNORMAL HIGH (ref 65–117)

## 2021-12-07 LAB — PHOSPHORUS
Phosphorus: 2.1 MG/DL — ABNORMAL LOW (ref 2.6–4.7)
Phosphorus: 2.1 MG/DL — ABNORMAL LOW (ref 2.6–4.7)

## 2021-12-07 LAB — POCT GLUCOSE
POC Glucose: 153 mg/dL — ABNORMAL HIGH (ref 65–117)
POC Glucose: 156 mg/dL — ABNORMAL HIGH (ref 65–117)
POC Glucose: 122 mg/dL — ABNORMAL HIGH (ref 65–117)
POC Glucose: 125 mg/dL — ABNORMAL HIGH (ref 65–117)
POC Glucose: 211 mg/dL — ABNORMAL HIGH (ref 65–117)
POC Glucose: 177 mg/dL — ABNORMAL HIGH (ref 65–117)

## 2021-12-07 LAB — CBC WITH AUTO DIFFERENTIAL
Basophils %: 0 % (ref 0–1)
Basophils Absolute: 0 10*3/uL (ref 0.0–0.1)
Eosinophils %: 6 % (ref 0–7)
Eosinophils Absolute: 0.4 10*3/uL (ref 0.0–0.4)
Granulocyte Absolute Count: 0 10*3/uL (ref 0.00–0.04)
Hematocrit: 44.4 % (ref 36.6–50.3)
Hemoglobin: 14.1 g/dL (ref 12.1–17.0)
Immature Granulocytes %: 0 % (ref 0.0–0.5)
Lymphocytes %: 29 % (ref 12–49)
Lymphocytes Absolute: 1.9 10*3/uL (ref 0.8–3.5)
MCH: 25.8 PG — ABNORMAL LOW (ref 26.0–34.0)
MCHC: 31.8 g/dL (ref 30.0–36.5)
MCV: 81.2 FL (ref 80.0–99.0)
MPV: 11 FL (ref 8.9–12.9)
Monocytes %: 8 % (ref 5–13)
Monocytes Absolute: 0.5 10*3/uL (ref 0.0–1.0)
NRBC Absolute: 0 10*3/uL (ref 0.00–0.01)
Neutrophils %: 57 % (ref 32–75)
Neutrophils Absolute: 3.8 10*3/uL (ref 1.8–8.0)
Nucleated RBCs: 0 PER 100 WBC
Platelets: 173 10*3/uL (ref 150–400)
RBC: 5.47 M/uL (ref 4.10–5.70)
RDW: 14.5 % (ref 11.5–14.5)
WBC: 6.7 10*3/uL (ref 4.1–11.1)

## 2021-12-07 LAB — BASIC METABOLIC PANEL
Anion Gap: 5 mmol/L (ref 5–15)
BUN/Creatinine Ratio: 21 — ABNORMAL HIGH (ref 12–20)
BUN: 17 MG/DL (ref 6–20)
CO2: 24 mmol/L (ref 21–32)
Calcium: 7.9 MG/DL — ABNORMAL LOW (ref 8.5–10.1)
Chloride: 108 mmol/L (ref 97–108)
Creatinine: 0.81 MG/DL (ref 0.70–1.30)
Est, Glom Filt Rate: >60 mL/min/{1.73_m2} (ref >60)
Glucose: 171 mg/dL — ABNORMAL HIGH (ref 65–100)
Potassium: 4 mmol/L (ref 3.5–5.1)
Sodium: 137 mmol/L (ref 136–145)

## 2021-12-07 MED ORDER — SODIUM PHOSPHATE 3 MMOLE/ML IV
3 mmol/mL | Freq: Once | INTRAVENOUS | Status: AC
Start: 2021-12-07 — End: 2021-12-07
  Administered 2021-12-07: 18:00:00 via INTRAVENOUS

## 2021-12-07 MED FILL — ACETAMINOPHEN 500 MG TAB: 500 mg | ORAL | Qty: 2

## 2021-12-07 MED FILL — CELECOXIB 100 MG CAP: 100 mg | ORAL | Qty: 1

## 2021-12-07 MED FILL — AMLODIPINE 5 MG TAB: 5 mg | ORAL | Qty: 2

## 2021-12-07 MED FILL — INSULIN LISPRO 100 UNIT/ML INJECTION: 100 unit/mL | SUBCUTANEOUS | Qty: 1

## 2021-12-07 MED FILL — GABAPENTIN 300 MG CAP: 300 mg | ORAL | Qty: 1

## 2021-12-07 MED FILL — LABETALOL 100 MG TAB: 100 mg | ORAL | Qty: 1

## 2021-12-07 MED FILL — MOVANTIK 12.5 MG TABLET: 12.5 mg | ORAL | Qty: 1

## 2021-12-07 MED FILL — PANTOPRAZOLE 40 MG TAB, DELAYED RELEASE: 40 mg | ORAL | Qty: 1

## 2021-12-07 MED FILL — ENOXAPARIN 40 MG/0.4 ML SUB-Q SYRINGE: 40 mg/0.4 mL | SUBCUTANEOUS | Qty: 0.4

## 2021-12-07 MED FILL — PROMETHAZINE 25 MG/ML INJECTION: 25 mg/mL | INTRAMUSCULAR | Qty: 1

## 2021-12-07 MED FILL — INSULIN GLARGINE 100 UNIT/ML INJECTION: 100 unit/mL | SUBCUTANEOUS | Qty: 1

## 2021-12-07 MED FILL — LISINOPRIL 20 MG TAB: 20 mg | ORAL | Qty: 2

## 2021-12-07 MED FILL — SODIUM PHOSPHATE 3 MMOLE/ML IV: 3 mmol/mL | INTRAVENOUS | Qty: 6.67

## 2021-12-07 NOTE — Progress Notes (Signed)
General Daily Progress Note    Admission Date: 12/03/2021  Hospital Day 5  Post-Op Day 4  Subjective:   Large volume of emesis yesterday afternoon.  None since then.  Loose colostomy output.  Pain well controlled.      Objective:   Patient Vitals for the past 24 hrs:   BP Temp Pulse Resp SpO2   12/07/21 0738 124/72 98.4 ??F (36.9 ??C) 63 18 91 %   12/07/21 0026 110/62 97.7 ??F (36.5 ??C) 76 18 93 %   12/06/21 2045 122/69 98.7 ??F (37.1 ??C) 60 18 92 %   12/06/21 1523 135/70 99.4 ??F (37.4 ??C) 68 18 93 %     No intake/output data recorded.  12/08 1901 - 12/10 0700  In: -   Out: 525 [Urine:325]    Physical Examination:  General Appearance - No distress.   Abdomen - Less distended.  Appropriately tender.  Incisions clean, dry, intact, and with ecchymosis but no abnormal erythema.  Colostomy viable; some loose stool in the appliance.  Extremities -  Pneumatic compression stockings in use.  No calf tenderness.             Data Review   Recent Results (from the past 24 hour(s))   GLUCOSE, POC    Collection Time: 12/06/21 12:22 PM   Result Value Ref Range    Glucose (POC) 144 (H) 65 - 117 mg/dL    Performed by Marvis Moeller    GLUCOSE, POC    Collection Time: 12/06/21  4:41 PM   Result Value Ref Range    Glucose (POC) 140 (H) 65 - 117 mg/dL    Performed by Helyn App    GLUCOSE, POC    Collection Time: 12/06/21  9:26 PM   Result Value Ref Range    Glucose (POC) 153 (H) 65 - 117 mg/dL    Performed by Rosalene Billings    GLUCOSE, POC    Collection Time: 12/06/21 10:28 PM   Result Value Ref Range    Glucose (POC) 156 (H) 65 - 117 mg/dL    Performed by Rivka Barbara CON    CBC WITH AUTOMATED DIFF    Collection Time: 12/07/21 12:09 AM   Result Value Ref Range    WBC 6.7 4.1 - 11.1 K/uL    RBC 5.47 4.10 - 5.70 M/uL    HGB 14.1 12.1 - 17.0 g/dL    HCT 90.2 40.9 - 73.5 %    MCV 81.2 80.0 - 99.0 FL    MCH 25.8 (L) 26.0 - 34.0 PG    MCHC 31.8 30.0 - 36.5 g/dL    RDW 32.9 92.4 - 26.8 %    PLATELET 173 150 - 400 K/uL    MPV 11.0 8.9  - 12.9 FL    NRBC 0.0 0 PER 100 WBC    ABSOLUTE NRBC 0.00 0.00 - 0.01 K/uL    NEUTROPHILS 57 32 - 75 %    LYMPHOCYTES 29 12 - 49 %    MONOCYTES 8 5 - 13 %    EOSINOPHILS 6 0 - 7 %    BASOPHILS 0 0 - 1 %    IMMATURE GRANULOCYTES 0 0.0 - 0.5 %    ABS. NEUTROPHILS 3.8 1.8 - 8.0 K/UL    ABS. LYMPHOCYTES 1.9 0.8 - 3.5 K/UL    ABS. MONOCYTES 0.5 0.0 - 1.0 K/UL    ABS. EOSINOPHILS 0.4 0.0 - 0.4 K/UL    ABS. BASOPHILS 0.0 0.0 - 0.1  K/UL    ABS. IMM. GRANS. 0.0 0.00 - 0.04 K/UL    DF AUTOMATED     PHOSPHORUS    Collection Time: 12/07/21 12:09 AM   Result Value Ref Range    Phosphorus 2.1 (L) 2.6 - 4.7 MG/DL   GLUCOSE, POC    Collection Time: 12/07/21  5:58 AM   Result Value Ref Range    Glucose (POC) 125 (H) 65 - 117 mg/dL    Performed by GUERRRERO Renalyn    GLUCOSE, POC    Collection Time: 12/07/21  6:17 AM   Result Value Ref Range    Glucose (POC) 122 (H) 65 - 117 mg/dL    Performed by Rivka Barbara CON            Assessment:     Principal Problem:    Fecal incontinence (12/03/2021)    Active Problems:    Type II diabetes mellitus (HCC) ()      Hypertension ()        4 Days Post-Op  s/p hand-assisted laparoscopic crration of divided loop colostomy.     Hemodynamically stable.  Hemoglobin stable.  Creatinine within normal limits.    Diabetes Management assistance appreciated.     GI function beginning to return, but patient not ready for solid food yet.    Hypophosphatemic.      Plan:   Continue clear liquid diet (to which the patient was returned yesterday evening).  Continue nutritional supplements.  Replace phosphorus.  Ambulate.  Physical therapy.  Incentive spirometer.  Continue ostomy education.

## 2021-12-08 LAB — METABOLIC PANEL, BASIC
Anion gap: 2 mmol/L — ABNORMAL LOW (ref 5–15)
BUN/Creatinine ratio: 14 (ref 12–20)
BUN: 9 MG/DL (ref 6–20)
CO2: 25 mmol/L (ref 21–32)
Calcium: 7.9 MG/DL — ABNORMAL LOW (ref 8.5–10.1)
Chloride: 113 mmol/L — ABNORMAL HIGH (ref 97–108)
Creatinine: 0.65 MG/DL — ABNORMAL LOW (ref 0.70–1.30)
Glucose: 162 mg/dL — ABNORMAL HIGH (ref 65–100)
Potassium: 3.7 mmol/L (ref 3.5–5.1)
Sodium: 140 mmol/L (ref 136–145)
eGFR: >60 mL/min/{1.73_m2} (ref >60)

## 2021-12-08 LAB — MAGNESIUM
Magnesium: 1.8 mg/dL (ref 1.6–2.4)
Magnesium: 1.8 mg/dL (ref 1.6–2.4)

## 2021-12-08 LAB — GLUCOSE, POC
Glucose (POC): 156 mg/dL — ABNORMAL HIGH (ref 65–117)
Glucose (POC): 107 mg/dL (ref 65–117)
Glucose (POC): 215 mg/dL — ABNORMAL HIGH (ref 65–117)
Glucose (POC): 179 mg/dL — ABNORMAL HIGH (ref 65–117)

## 2021-12-08 LAB — CBC W/O DIFF
ABSOLUTE NRBC: 0 10*3/uL (ref 0.00–0.01)
HCT: 41.9 % (ref 36.6–50.3)
HGB: 13.3 g/dL (ref 12.1–17.0)
MCH: 25.5 PG — ABNORMAL LOW (ref 26.0–34.0)
MCHC: 31.7 g/dL (ref 30.0–36.5)
MCV: 80.3 FL (ref 80.0–99.0)
MPV: 10.6 FL (ref 8.9–12.9)
NRBC: 0 PER 100 WBC
PLATELET: 173 10*3/uL (ref 150–400)
RBC: 5.22 M/uL (ref 4.10–5.70)
RDW: 14.4 % (ref 11.5–14.5)
WBC: 5.7 10*3/uL (ref 4.1–11.1)

## 2021-12-08 LAB — PHOSPHORUS
Phosphorus: 2.3 MG/DL — ABNORMAL LOW (ref 2.6–4.7)
Phosphorus: 2.3 MG/DL — ABNORMAL LOW (ref 2.6–4.7)

## 2021-12-08 LAB — CBC
Hematocrit: 41.9 % (ref 36.6–50.3)
Hemoglobin: 13.3 g/dL (ref 12.1–17.0)
MCH: 25.5 PG — ABNORMAL LOW (ref 26.0–34.0)
MCHC: 31.7 g/dL (ref 30.0–36.5)
MCV: 80.3 FL (ref 80.0–99.0)
MPV: 10.6 FL (ref 8.9–12.9)
NRBC Absolute: 0 10*3/uL (ref 0.00–0.01)
Nucleated RBCs: 0 PER 100 WBC
Platelets: 173 10*3/uL (ref 150–400)
RBC: 5.22 M/uL (ref 4.10–5.70)
RDW: 14.4 % (ref 11.5–14.5)
WBC: 5.7 10*3/uL (ref 4.1–11.1)

## 2021-12-08 LAB — BASIC METABOLIC PANEL
Anion Gap: 2 mmol/L — ABNORMAL LOW (ref 5–15)
BUN/Creatinine Ratio: 14 (ref 12–20)
BUN: 9 MG/DL (ref 6–20)
CO2: 25 mmol/L (ref 21–32)
Calcium: 7.9 MG/DL — ABNORMAL LOW (ref 8.5–10.1)
Chloride: 113 mmol/L — ABNORMAL HIGH (ref 97–108)
Creatinine: 0.65 MG/DL — ABNORMAL LOW (ref 0.70–1.30)
Est, Glom Filt Rate: >60 mL/min/{1.73_m2} (ref >60)
Glucose: 162 mg/dL — ABNORMAL HIGH (ref 65–100)
Potassium: 3.7 mmol/L (ref 3.5–5.1)
Sodium: 140 mmol/L (ref 136–145)

## 2021-12-08 LAB — POCT GLUCOSE
POC Glucose: 156 mg/dL — ABNORMAL HIGH (ref 65–117)
POC Glucose: 107 mg/dL (ref 65–117)
POC Glucose: 215 mg/dL — ABNORMAL HIGH (ref 65–117)
POC Glucose: 179 mg/dL — ABNORMAL HIGH (ref 65–117)

## 2021-12-08 MED ORDER — K PHOS DI & MONO-SOD PHOS MONO 250 MG TAB
250 mg | Freq: Three times a day (TID) | ORAL | Status: AC
Start: 2021-12-08 — End: 2021-12-10
  Administered 2021-12-08 – 2021-12-10 (×7): via ORAL

## 2021-12-08 MED FILL — INSULIN GLARGINE 100 UNIT/ML INJECTION: 100 unit/mL | SUBCUTANEOUS | Qty: 1

## 2021-12-08 MED FILL — GABAPENTIN 300 MG CAP: 300 mg | ORAL | Qty: 1

## 2021-12-08 MED FILL — MOVANTIK 12.5 MG TABLET: 12.5 mg | ORAL | Qty: 1

## 2021-12-08 MED FILL — CELECOXIB 100 MG CAP: 100 mg | ORAL | Qty: 1

## 2021-12-08 MED FILL — LABETALOL 100 MG TAB: 100 mg | ORAL | Qty: 1

## 2021-12-08 MED FILL — PHOSPHA NEUTRAL 250 MG TABLET: 250 mg | ORAL | Qty: 1

## 2021-12-08 MED FILL — PANTOPRAZOLE 40 MG TAB, DELAYED RELEASE: 40 mg | ORAL | Qty: 1

## 2021-12-08 MED FILL — ACETAMINOPHEN 500 MG TAB: 500 mg | ORAL | Qty: 2

## 2021-12-08 MED FILL — PROMETHAZINE 25 MG/ML INJECTION: 25 mg/mL | INTRAMUSCULAR | Qty: 1

## 2021-12-08 MED FILL — ENOXAPARIN 40 MG/0.4 ML SUB-Q SYRINGE: 40 mg/0.4 mL | SUBCUTANEOUS | Qty: 0.4

## 2021-12-08 MED FILL — AMLODIPINE 5 MG TAB: 5 mg | ORAL | Qty: 2

## 2021-12-08 MED FILL — INSULIN LISPRO 100 UNIT/ML INJECTION: 100 unit/mL | SUBCUTANEOUS | Qty: 1

## 2021-12-08 MED FILL — LISINOPRIL 20 MG TAB: 20 mg | ORAL | Qty: 2

## 2021-12-08 NOTE — Progress Notes (Signed)
General Daily Progress Note    Admission Date: 12/03/2021  Hospital Day 6  Post-Op Day 5  Subjective:   No nausea today.  Small amount of gas and stool from colostomy.  Pain well controlled.      Objective:   Patient Vitals for the past 24 hrs:   BP Temp Pulse Resp SpO2   12/08/21 0744 134/70 98.2 ??F (36.8 ??C) (!) 59 16 93 %   12/07/21 2355 116/68 98.2 ??F (36.8 ??C) 66 18 93 %   12/07/21 1948 128/71 98.5 ??F (36.9 ??C) 64 16 96 %   12/07/21 1549 123/70 98.3 ??F (36.8 ??C) 60 16 94 %     No intake/output data recorded.  12/09 1901 - 12/11 0700  In: -   Out: 300       Physical Examination:  General Appearance - No distress.   Abdomen - Less distended.  Appropriately tender.  Incisions clean, dry, intact, and with ecchymosis but no erythema.  Colostomy viable; some thick stool in the appliance.  Extremities -  Pneumatic compression stockings in use.          Data Review   Recent Results (from the past 24 hour(s))   GLUCOSE, POC    Collection Time: 12/07/21 11:03 AM   Result Value Ref Range    Glucose (POC) 211 (H) 65 - 117 mg/dL    Performed by Eli Hose    GLUCOSE, POC    Collection Time: 12/07/21  4:34 PM   Result Value Ref Range    Glucose (POC) 177 (H) 65 - 117 mg/dL    Performed by Vivi Ferns    GLUCOSE, POC    Collection Time: 12/07/21  9:09 PM   Result Value Ref Range    Glucose (POC) 156 (H) 65 - 117 mg/dL    Performed by Vivi Ferns    CBC W/O DIFF    Collection Time: 12/08/21  3:33 AM   Result Value Ref Range    WBC 5.7 4.1 - 11.1 K/uL    RBC 5.22 4.10 - 5.70 M/uL    HGB 13.3 12.1 - 17.0 g/dL    HCT 41.9 36.6 - 50.3 %    MCV 80.3 80.0 - 99.0 FL    MCH 25.5 (L) 26.0 - 34.0 PG    MCHC 31.7 30.0 - 36.5 g/dL    RDW 14.4 11.5 - 14.5 %    PLATELET 173 150 - 400 K/uL    MPV 10.6 8.9 - 12.9 FL    NRBC 0.0 0 PER 100 WBC    ABSOLUTE NRBC 0.00 0.00 - 7.12 K/uL   METABOLIC PANEL, BASIC    Collection Time: 12/08/21  3:33 AM   Result Value Ref Range    Sodium 140 136 - 145 mmol/L    Potassium 3.7 3.5 - 5.1 mmol/L     Chloride 113 (H) 97 - 108 mmol/L    CO2 25 21 - 32 mmol/L    Anion gap 2 (L) 5 - 15 mmol/L    Glucose 162 (H) 65 - 100 mg/dL    BUN 9 6 - 20 MG/DL    Creatinine 0.65 (L) 0.70 - 1.30 MG/DL    BUN/Creatinine ratio 14 12 - 20      eGFR >60 >60 ml/min/1.19m    Calcium 7.9 (L) 8.5 - 10.1 MG/DL   MAGNESIUM    Collection Time: 12/08/21  3:33 AM   Result Value Ref Range    Magnesium 1.8 1.6 -  2.4 mg/dL   PHOSPHORUS    Collection Time: 12/08/21  3:33 AM   Result Value Ref Range    Phosphorus 2.3 (L) 2.6 - 4.7 MG/DL   GLUCOSE, POC    Collection Time: 12/08/21  6:33 AM   Result Value Ref Range    Glucose (POC) 107 65 - 117 mg/dL    Performed by Ilsa Iha            Assessment:     Principal Problem:    Fecal incontinence (12/03/2021)    Active Problems:    Type II diabetes mellitus (HCC) ()      Hypertension ()        5 Days Post-Op s/p hand-assisted laparoscopic crration of divided loop colostomy.     Hemodynamically stable.  Hemoglobin stable.  Creatinine within normal limits.     Diabetes Management assistance appreciated.     GI function beginning to return.     Hypophosphatemic.    Plan:   Begin full liquid diet.  Continue nutritional supplements.  Replace phosphorus.  Ambulate.  Physical therapy.  Incentive spirometer.  Continue ostomy education.

## 2021-12-09 LAB — GLUCOSE, POC
Glucose (POC): 219 mg/dL — ABNORMAL HIGH (ref 65–117)
Glucose (POC): 99 mg/dL (ref 65–117)
Glucose (POC): 176 mg/dL — ABNORMAL HIGH (ref 65–117)
Glucose (POC): 124 mg/dL — ABNORMAL HIGH (ref 65–117)

## 2021-12-09 LAB — CBC WITH AUTOMATED DIFF
ABS. BASOPHILS: 0 10*3/uL (ref 0.0–0.1)
ABS. EOSINOPHILS: 0.3 10*3/uL (ref 0.0–0.4)
ABS. IMM. GRANS.: 0 10*3/uL (ref 0.00–0.04)
ABS. LYMPHOCYTES: 2.1 10*3/uL (ref 0.8–3.5)
ABS. MONOCYTES: 0.4 10*3/uL (ref 0.0–1.0)
ABS. NEUTROPHILS: 2.4 10*3/uL (ref 1.8–8.0)
ABSOLUTE NRBC: 0 10*3/uL (ref 0.00–0.01)
BASOPHILS: 1 % (ref 0–1)
EOSINOPHILS: 5 % (ref 0–7)
HCT: 41.2 % (ref 36.6–50.3)
HGB: 13.2 g/dL (ref 12.1–17.0)
IMMATURE GRANULOCYTES: 0 % (ref 0.0–0.5)
LYMPHOCYTES: 40 % (ref 12–49)
MCH: 25.4 PG — ABNORMAL LOW (ref 26.0–34.0)
MCHC: 32 g/dL (ref 30.0–36.5)
MCV: 79.2 FL — ABNORMAL LOW (ref 80.0–99.0)
MONOCYTES: 8 % (ref 5–13)
MPV: 10.3 FL (ref 8.9–12.9)
NEUTROPHILS: 46 % (ref 32–75)
NRBC: 0 PER 100 WBC
PLATELET: 188 10*3/uL (ref 150–400)
RBC: 5.2 M/uL (ref 4.10–5.70)
RDW: 14.5 % (ref 11.5–14.5)
WBC: 5.3 10*3/uL (ref 4.1–11.1)

## 2021-12-09 LAB — METABOLIC PANEL, BASIC
Anion gap: 5 mmol/L (ref 5–15)
BUN/Creatinine ratio: 11 — ABNORMAL LOW (ref 12–20)
BUN: 6 MG/DL (ref 6–20)
CO2: 24 mmol/L (ref 21–32)
Calcium: 7.9 MG/DL — ABNORMAL LOW (ref 8.5–10.1)
Chloride: 112 mmol/L — ABNORMAL HIGH (ref 97–108)
Creatinine: 0.53 MG/DL — ABNORMAL LOW (ref 0.70–1.30)
Glucose: 119 mg/dL — ABNORMAL HIGH (ref 65–100)
Potassium: 3.6 mmol/L (ref 3.5–5.1)
Sodium: 141 mmol/L (ref 136–145)
eGFR: >60 mL/min/{1.73_m2} (ref >60)

## 2021-12-09 LAB — PHOSPHORUS
Phosphorus: 2.7 MG/DL (ref 2.6–4.7)
Phosphorus: 2.7 MG/DL (ref 2.6–4.7)

## 2021-12-09 LAB — CBC WITH AUTO DIFFERENTIAL
Basophils %: 1 % (ref 0–1)
Basophils Absolute: 0 10*3/uL (ref 0.0–0.1)
Eosinophils %: 5 % (ref 0–7)
Eosinophils Absolute: 0.3 10*3/uL (ref 0.0–0.4)
Granulocyte Absolute Count: 0 10*3/uL (ref 0.00–0.04)
Hematocrit: 41.2 % (ref 36.6–50.3)
Hemoglobin: 13.2 g/dL (ref 12.1–17.0)
Immature Granulocytes %: 0 % (ref 0.0–0.5)
Lymphocytes %: 40 % (ref 12–49)
Lymphocytes Absolute: 2.1 10*3/uL (ref 0.8–3.5)
MCH: 25.4 PG — ABNORMAL LOW (ref 26.0–34.0)
MCHC: 32 g/dL (ref 30.0–36.5)
MCV: 79.2 FL — ABNORMAL LOW (ref 80.0–99.0)
MPV: 10.3 FL (ref 8.9–12.9)
Monocytes %: 8 % (ref 5–13)
Monocytes Absolute: 0.4 10*3/uL (ref 0.0–1.0)
NRBC Absolute: 0 10*3/uL (ref 0.00–0.01)
Neutrophils %: 46 % (ref 32–75)
Neutrophils Absolute: 2.4 10*3/uL (ref 1.8–8.0)
Nucleated RBCs: 0 PER 100 WBC
Platelets: 188 10*3/uL (ref 150–400)
RBC: 5.2 M/uL (ref 4.10–5.70)
RDW: 14.5 % (ref 11.5–14.5)
WBC: 5.3 10*3/uL (ref 4.1–11.1)

## 2021-12-09 LAB — POCT GLUCOSE
POC Glucose: 219 mg/dL — ABNORMAL HIGH (ref 65–117)
POC Glucose: 99 mg/dL (ref 65–117)
POC Glucose: 176 mg/dL — ABNORMAL HIGH (ref 65–117)
POC Glucose: 124 mg/dL — ABNORMAL HIGH (ref 65–117)

## 2021-12-09 LAB — BASIC METABOLIC PANEL
Anion Gap: 5 mmol/L (ref 5–15)
BUN/Creatinine Ratio: 11 — ABNORMAL LOW (ref 12–20)
BUN: 6 MG/DL (ref 6–20)
CO2: 24 mmol/L (ref 21–32)
Calcium: 7.9 MG/DL — ABNORMAL LOW (ref 8.5–10.1)
Chloride: 112 mmol/L — ABNORMAL HIGH (ref 97–108)
Creatinine: 0.53 MG/DL — ABNORMAL LOW (ref 0.70–1.30)
Est, Glom Filt Rate: >60 mL/min/{1.73_m2} (ref >60)
Glucose: 119 mg/dL — ABNORMAL HIGH (ref 65–100)
Potassium: 3.6 mmol/L (ref 3.5–5.1)
Sodium: 141 mmol/L (ref 136–145)

## 2021-12-09 MED ORDER — INSULIN LISPRO 100 UNIT/ML INJECTION
100 unit/mL | Freq: Three times a day (TID) | SUBCUTANEOUS | Status: AC
Start: 2021-12-09 — End: 2021-12-10
  Administered 2021-12-09 – 2021-12-10 (×3): via SUBCUTANEOUS

## 2021-12-09 MED FILL — INSULIN LISPRO 100 UNIT/ML INJECTION: 100 unit/mL | SUBCUTANEOUS | Qty: 1

## 2021-12-09 MED FILL — PHOSPHA NEUTRAL 250 MG TABLET: 250 mg | ORAL | Qty: 1

## 2021-12-09 MED FILL — CELECOXIB 100 MG CAP: 100 mg | ORAL | Qty: 1

## 2021-12-09 MED FILL — INSULIN GLARGINE 100 UNIT/ML INJECTION: 100 unit/mL | SUBCUTANEOUS | Qty: 1

## 2021-12-09 MED FILL — ACETAMINOPHEN 500 MG TAB: 500 mg | ORAL | Qty: 2

## 2021-12-09 MED FILL — ENOXAPARIN 40 MG/0.4 ML SUB-Q SYRINGE: 40 mg/0.4 mL | SUBCUTANEOUS | Qty: 0.4

## 2021-12-09 MED FILL — GABAPENTIN 300 MG CAP: 300 mg | ORAL | Qty: 1

## 2021-12-09 MED FILL — PANTOPRAZOLE 40 MG TAB, DELAYED RELEASE: 40 mg | ORAL | Qty: 1

## 2021-12-09 MED FILL — LABETALOL 100 MG TAB: 100 mg | ORAL | Qty: 1

## 2021-12-09 MED FILL — LISINOPRIL 20 MG TAB: 20 mg | ORAL | Qty: 2

## 2021-12-09 MED FILL — MOVANTIK 12.5 MG TABLET: 12.5 mg | ORAL | Qty: 1

## 2021-12-09 MED FILL — VIRT-PHOS NEUTRAL 250 MG TABLET: 250 mg | ORAL | Qty: 1

## 2021-12-09 MED FILL — AMLODIPINE 5 MG TAB: 5 mg | ORAL | Qty: 2

## 2021-12-09 NOTE — Progress Notes (Signed)
RUR: 8% Low    TOC: Anticipated discharge home. [place]; home health will be arranged with patient's PCP. Patient's sister will provide transport home once medically stable. Follow-up with PCP/specialist.      Primary Contact: Wife, Evan Shepherd, [phone]    *Will need 2nd IM letter prior to DC.      3:10PM - CM reviewed chart. Per review and ID rounds, patient is POD#6 for hand assisted laparoscopic creation of divided loop colostomy. Discharge pending medical progress and final recommendations. CM will continue to follow as needed.     12/9 note:  CM unable to secure home health due to patient living out of state. CM provided patient and wife HH SN order, H&P, wound care note and face sheet in order to provide to his PCP in [place] to arrange home health SN services. CM also faxed copies to patient's PCP office. Patient and wife provided fax confirmation sheet indicating fax was successfully sent to PCP.    Leodis Sias, MSW   [phone]

## 2021-12-09 NOTE — Progress Notes (Signed)
General Daily Progress Note    Admission Date: 12/03/2021  Hospital Day 7  Post-Op Day 6  Subjective:   Tolerating full liquid diet.  Passing stool via colostomy.  Passed a small amount of mucous per anus.  Voiding well.  Good pain control.      Objective:   Patient Vitals for the past 24 hrs:   BP Temp Pulse Resp SpO2   12/09/21 1500 133/74 97.8 ??F (36.6 ??C) 61 18 94 %   12/09/21 0756 (!) 146/70 97.7 ??F (36.5 ??C) 63 18 94 %   12/08/21 2254 (!) 140/56 98.3 ??F (36.8 ??C) 60 18 94 %   12/08/21 2047 (!) 144/78 98.5 ??F (36.9 ??C) 60 16 92 %     12/12 0701 - 12/12 1900  In: 240 [P.O.:240]  Out: 200   No intake/output data recorded.      Physical Examination:  General Appearance - No distress.   Abdomen - Soft.  Incisions clean, dry, intact, and with ecchymosis but no erythema.  Colostomy viable; thick stool in the appliance.  Extremities -  Pneumatic compression stockings in use.              Data Review   Recent Results (from the past 24 hour(s))   GLUCOSE, POC    Collection Time: 12/08/21  9:31 PM   Result Value Ref Range    Glucose (POC) 219 (H) 65 - 117 mg/dL    Performed by [place], BASIC    Collection Time: 12/09/21  3:56 AM   Result Value Ref Range    Sodium 141 136 - 145 mmol/L    Potassium 3.6 3.5 - 5.1 mmol/L    Chloride 112 (H) 97 - 108 mmol/L    CO2 24 21 - 32 mmol/L    Anion gap 5 5 - 15 mmol/L    Glucose 119 (H) 65 - 100 mg/dL    BUN 6 6 - 20 MG/DL    Creatinine 0.53 (L) 0.70 - 1.30 MG/DL    BUN/Creatinine ratio 11 (L) 12 - 20      eGFR >60 >60 ml/min/1.52m    Calcium 7.9 (L) 8.5 - 10.1 MG/DL   PHOSPHORUS    Collection Time: 12/09/21  3:56 AM   Result Value Ref Range    Phosphorus 2.7 2.6 - 4.7 MG/DL   CBC WITH AUTOMATED DIFF    Collection Time: 12/09/21  3:56 AM   Result Value Ref Range    WBC 5.3 4.1 - 11.1 K/uL    RBC 5.20 4.10 - 5.70 M/uL    HGB 13.2 12.1 - 17.0 g/dL    HCT 41.2 36.6 - 50.3 %    MCV 79.2 (L) 80.0 - 99.0 FL    MCH 25.4 (L) 26.0 - 34.0 PG    MCHC 32.0 30.0 - 36.5 g/dL     RDW 14.5 11.5 - 14.5 %    PLATELET 188 150 - 400 K/uL    MPV 10.3 8.9 - 12.9 FL    NRBC 0.0 0 PER 100 WBC    ABSOLUTE NRBC 0.00 0.00 - 0.01 K/uL    NEUTROPHILS 46 32 - 75 %    LYMPHOCYTES 40 12 - 49 %    MONOCYTES 8 5 - 13 %    EOSINOPHILS 5 0 - 7 %    BASOPHILS 1 0 - 1 %    IMMATURE GRANULOCYTES 0 0.0 - 0.5 %    ABS. NEUTROPHILS 2.4  1.8 - 8.0 K/UL    ABS. LYMPHOCYTES 2.1 0.8 - 3.5 K/UL    ABS. MONOCYTES 0.4 0.0 - 1.0 K/UL    ABS. EOSINOPHILS 0.3 0.0 - 0.4 K/UL    ABS. BASOPHILS 0.0 0.0 - 0.1 K/UL    ABS. IMM. GRANS. 0.0 0.00 - 0.04 K/UL    DF AUTOMATED     GLUCOSE, POC    Collection Time: 12/09/21  6:46 AM   Result Value Ref Range    Glucose (POC) 99 65 - 117 mg/dL    Performed by [place], POC    Collection Time: 12/09/21 11:31 AM   Result Value Ref Range    Glucose (POC) 176 (H) 65 - 117 mg/dL    Performed by [place], POC    Collection Time: 12/09/21  4:39 PM   Result Value Ref Range    Glucose (POC) 124 (H) 65 - 117 mg/dL    Performed by Florene Glen  PATRICIA            Assessment:     Principal Problem:    Fecal incontinence (12/03/2021)    Active Problems:    Type II diabetes mellitus (HCC) ()      Hypertension ()        6 Days Post-Op  s/p hand-assisted laparoscopic crration of divided loop colostomy.     Hemodynamically stable.  Hemoglobin stable.  Creatinine within normal limits.     Diabetes Management assistance appreciated.     GI function is returning.     Hypophosphatemia corrected.      Plan:   Begin low fiber diabetic diet.  Continue nutritional supplements.  Ambulate.  Physical therapy.  Incentive spirometer.  Continue ostomy education.    Possible discharge to home tomorrow.

## 2021-12-09 NOTE — Group Note (Signed)
Diabetes  Mgmt by Heinz Knuckles, CNS at 12/09/21 1324                Author: Heinz Knuckles, CNS  Service: Certified Clinical Nurse Specialist  Author Type: Clinical Nurse Specialist       Filed: 12/09/21 1351  Date of Service: 12/09/21 1324  Status: Signed          Editor: Heinz Knuckles, CNS (Clinical Nurse Specialist)               [facility]   [facility]   DIABETES MANAGEMENT CONSULT      Consulted by  Theresia Majors, MD  for advanced nursing evaluation and care for inpatient blood glucose management.        Evaluation and Action Plan     Evan Shepherd is a 65 year old gentleman with controlled Type 2 Diabetes on high dose Lantus/Novolog, Jardiance and Metformin, admitted with uncontrolled fecal incontinence  now s/p hand-assisted laparoscopic creation of divided loop colostomy, cystoscopy, and bilateral ureteral catheter placement.  His A1C on admission  was at goal at 6.1% and patient diligent with diabetes management PTA.    Inially on admission, all antihyperglycemic agents have been held.  He did have 4mg  decadron x1 in the OR without hyperglycemia.  His fasting BG was 147 and 230 now that oral intake  has started. He benefited from resuming basal insulin at moderate dose.  Now that diet is advancing and eating about half his trays, would be reasonable to resume bolus humalog- again with a dose reduction (compared to PTA) since intake here is less than  his baseline eating habits prior to surgery.    Inpatient BG goal is 140-180mg /dl.             Management  Rationale  Action Plan       Medication         Basal needs  Using 0.3 units/kg/D based on obesity    and A1C  CONTINUE Lantus 35 units HS-If BG starts to trend >180 (via our hospital POC checks) advance  basal insulin to 40 units daily        Nutritional needs  Using low sensitivity   Start 5 units Humalog/meal      Hold if patient is NPO or consumes less than 50% of carbohydrates on meal tray     Corrective  insulin  Resistant sensitivity  Resistant Sensitivity ACHS       Additional Orders:   1.  POC glucose ACHS.   Ok for her to wear a continuous glucose monitor (CGM/Dexcom) but all hospital doses should be made off fingerstick glucose values (serum glucose).  CGMs measure  glucose in the interstitial space and there is a lag time bw interstitial glucose and blood glucose.  CGM results can also be impacted by fluid shifts and hypotension.      2.  Diet advancement per primary team.  When advanced, please include consistent carbohydrate component of diet (60 grams CHO/meal)- this  can be added to clear and full liq diets.          Discharge Recommendations:   1.  FUV with endocrinologist per routine.  Asked patient to reach out to endo if BG higher or lower than goal with med adjustments       2.  Reduce Basaglar to 35 units SQ once daily      3.  Resume metformin and jardiance at PTA dose  4.  Stop mealtime humalog- Suspect that daytime BG will be reasonably controlled with metfomin/jardiance on d/c.  He is eating much less  at this time compared to PTA.  Advised patient to work with endocrinologist if he sees that his BG elevated over goal when nutrition and appetite improves on discharge home.                     Initial Presentation     Evan Shepherd is a 65 y.o. male admitted 12/03/21 for surgical management of chronic fecal incontinence.        HX:      Past Medical History:        Diagnosis  Date         ?  Chronic kidney disease, stage I       ?  Dyslipidemia       ?  Erectile dysfunction       ?  Fecal incontinence       ?  GERD (gastroesophageal reflux disease)       ?  History of colonic polyps       ?  History of rectal cancer  2006          Treated with neoadjuvant chemoradiation followed by surgery and then, presumably, adjuvant chemotherapy. The patient appears to have been cured.         ?  Hypertension           ?  Type II diabetes mellitus (HCC)              INITIAL DX:    Fecal incontinence  [R15.9]         Current Treatment        TX: Hand-assisted laparoscopic Creation of divided loop colostomy, Cystoscopy, bilateral  ureteral catheter placement         Hospital Course     Clinical progress has been uncomplicated.    12/6: Hand-assisted laparoscopic Creation of divided loop colostomy, Cystoscopy, bilateral ureteral catheter placement      Diabetes History     Type 2 Diabetes: Diagnosed 20 years ago   Ambulatory BG management provided by: His engocrinologist in [place], [address] Talmage Coin   Family History: Positive for Type 2 Diabetes, Brother with DM2 (passed away at age 24)      Diabetes-related Medical History   Neurological complications   Impotence and Peripheral neuropathy   Microvascular disease   Nephropathy   Other associated conditions      Hx rectal cancer J-pouch to anal anastomosis and diverting loop ileostomy 2006; ileostomy closed 2007      Diabetes Medication History     Key Antihyperglycemic Medications                                empagliflozin (JARDIANCE) 25 mg tablet  (Taking)  Take 25 mg by mouth daily.       insulin glargine (LANTUS,BASAGLAR) 100 unit/mL (3 mL) inpn  (Taking)  52 Units by SubCUTAneous route nightly.           insulin aspart U-100 (NOVOLOG) 100 unit/mL (3 mL) inpn  (Taking)  by SubCUTAneous route Before breakfast, lunch, and dinner. 16 UNITS AT BREAKFAST, 25 UNITS AT LUNCH, 30 UNITS WITH SUPPER                Metformin 850mg  BID      Diabetes self-management practices:  Eating pattern    [x]  Breakfast  Toast- 2 pieces and  tea.  2 hrs later will have 2 eggs   [x]  Lunch    Sandwich or Pita with curry   [x]  Dinner   Pita with rice and curry   [x]  Beverages  Water, tea   Physical activity pattern    Sedentary.  Mostly sits at a desk- lots of activity would exacerbate incontinence.    Monitoring pattern    Started to wear a dexcom G6 4 mos ago    May have a fasting low 1-2 times a month (as low as 47)- otherwise fasting is 110-120    May have a low during the day  "sometimes"- he attributes to taking his mealtime novolog too early before eating    Daytime BG under 160s   Taking medications pattern-Dexcom CGM (currently on left upper arm)   [x]  Consistent administration   [x]  Affordable   Social determinants of health impacting diabetes self-management practices    Concerned that you need to know more about how to stay healthy with diabetes      Overall evaluation:     [x]   Achieving A1c target with drug therapy & self-care practices      History includes: rectal cancer with J-pouch to anal anastomosis and diverting loop ileostomy 2006; ileostomy closed 2007; , and he had good function for the first one or two years after the operation. Gradually, he experienced increasing problems  with fecal incontinence. Plan to leave the j-pouch in situ.      Subjective     I feel comfortable with paying close to my numbers and adjusting insulin if I need to.         Objective     Physical exam   General Obese male in acute post-op pain. Conversant and cooperative   Neuro  Alert, oriented    Vital Signs Visit Vitals      BP  (!) 146/70     Pulse  63     Temp  97.7 ??F (36.5 ??C)     Resp  18     Ht  5\' 10"  (1.778 m)     Wt  102.5 kg (225 lb 15.5 oz)     SpO2  94%        BMI  32.42 kg/m??        Skin  Warm and dry. Acanthosis noted along neckline. No lipohypertrophy or lipoatrophy noted at injection sites. Abdominal scars (healed).  New LLQ colostomy with laparoscopic incisions. Abdomen tender and a little swollen from surgery     Heart   Regular rate and rhythm. No murmurs, rubs or gallops   Lungs  Clear to auscultation without rales or rhonchi   Extremities No foot wounds           Laboratory     Recent Labs             12/09/21   0356  12/08/21   0333  12/07/21   0009     GLU  119*  162*  171*     AGAP  5  2*  5     WBC  5.3  5.7  6.7          CREA  0.53*  0.65*  0.81              Factors impacting BG management       Factor  Dose  Comments  Nutrition:   Standard meals   Oral  diabetes supplement ordered     60 grams/meal   Low fat/fiber   Eating less than half his trays           Drugs:      Steroids           Decadron 4mg          Impairs insulin action         Pain             Other:    Kidney function     GFR over 60          Blood glucose pattern        Significant diabetes-related events over the past 24-72 hours   A1C 6.1%   Fasting BG: 99, 107, 122   Pre-prandial: 176-219   Correction: 6 units in past 24h   4mg  decadron x1 OR   Basal: 35 units Lantus QHS   Correction: resistant sensitivity 8 units in the last 24h   Full liq diet        Assessment and Nursing Intervention        Nursing Diagnosis  Risk for unstable blood glucose pattern     Nursing Intervention Domain  5250 Decision-making Support        Nursing Interventions  Examined current inpatient diabetes/blood glucose control    Explored factors facilitating and impeding inpatient management   Explored corrective strategies with patient and responsible inpatient provider    Informed patient of rational for insulin strategy while hospitalized           Nursing Diagnosis  00078 Ineffective Health Management        Nursing Intervention Domain  5250 Decision-making Support        Nursing Interventions  Identified diabetes self-management practices impeding diabetes control   Discussed diabetes survival skills related to   1.  Healthy Plate eating plan; given handouts   2.  Role of physical activity in improving insulin sensitivity and action   3.  Procedure for blood glucose monitoring & options for low-cost products   4.  Medications plan at discharge             Billing Code(s)     [x]  [phone]   Before making these care recommendations, I personally reviewed the hospitalization record, including notes, laboratory & diagnostic data and current medications, and examined the patient at  the bedside (circumstances permitting) before making care recommendations. More than fifty (50) percent of the time was spent in patient counseling  and/or care coordination.   Total minutes: 25      10-22-2000, CNS   Diabetes Clinical Nurse Specialist   [facility] Diabetes [facility]   Access via [employer]

## 2021-12-10 LAB — CBC W/O DIFF
ABSOLUTE NRBC: 0 10*3/uL (ref 0.00–0.01)
HCT: 43.3 % (ref 36.6–50.3)
HGB: 13.6 g/dL (ref 12.1–17.0)
MCH: 25.1 PG — ABNORMAL LOW (ref 26.0–34.0)
MCHC: 31.4 g/dL (ref 30.0–36.5)
MCV: 79.9 FL — ABNORMAL LOW (ref 80.0–99.0)
MPV: 10.6 FL (ref 8.9–12.9)
NRBC: 0 PER 100 WBC
PLATELET: 203 10*3/uL (ref 150–400)
RBC: 5.42 M/uL (ref 4.10–5.70)
RDW: 14.6 % — ABNORMAL HIGH (ref 11.5–14.5)
WBC: 6 10*3/uL (ref 4.1–11.1)

## 2021-12-10 LAB — METABOLIC PANEL, BASIC
Anion gap: 4 mmol/L — ABNORMAL LOW (ref 5–15)
BUN/Creatinine ratio: 13 (ref 12–20)
BUN: 8 MG/DL (ref 6–20)
CO2: 23 mmol/L (ref 21–32)
Calcium: 8.3 MG/DL — ABNORMAL LOW (ref 8.5–10.1)
Chloride: 112 mmol/L — ABNORMAL HIGH (ref 97–108)
Creatinine: 0.61 MG/DL — ABNORMAL LOW (ref 0.70–1.30)
Glucose: 143 mg/dL — ABNORMAL HIGH (ref 65–100)
Potassium: 3.9 mmol/L (ref 3.5–5.1)
Sodium: 139 mmol/L (ref 136–145)
eGFR: >60 mL/min/{1.73_m2} (ref >60)

## 2021-12-10 LAB — GLUCOSE, POC
Glucose (POC): 161 mg/dL — ABNORMAL HIGH (ref 65–117)
Glucose (POC): 119 mg/dL — ABNORMAL HIGH (ref 65–117)
Glucose (POC): 151 mg/dL — ABNORMAL HIGH (ref 65–117)

## 2021-12-10 LAB — BASIC METABOLIC PANEL
Anion Gap: 4 mmol/L — ABNORMAL LOW (ref 5–15)
BUN/Creatinine Ratio: 13 (ref 12–20)
BUN: 8 MG/DL (ref 6–20)
CO2: 23 mmol/L (ref 21–32)
Calcium: 8.3 MG/DL — ABNORMAL LOW (ref 8.5–10.1)
Chloride: 112 mmol/L — ABNORMAL HIGH (ref 97–108)
Creatinine: 0.61 MG/DL — ABNORMAL LOW (ref 0.70–1.30)
Est, Glom Filt Rate: >60 mL/min/{1.73_m2} (ref >60)
Glucose: 143 mg/dL — ABNORMAL HIGH (ref 65–100)
Potassium: 3.9 mmol/L (ref 3.5–5.1)
Sodium: 139 mmol/L (ref 136–145)

## 2021-12-10 LAB — CBC
Hematocrit: 43.3 % (ref 36.6–50.3)
Hemoglobin: 13.6 g/dL (ref 12.1–17.0)
MCH: 25.1 PG — ABNORMAL LOW (ref 26.0–34.0)
MCHC: 31.4 g/dL (ref 30.0–36.5)
MCV: 79.9 FL — ABNORMAL LOW (ref 80.0–99.0)
MPV: 10.6 FL (ref 8.9–12.9)
NRBC Absolute: 0 10*3/uL (ref 0.00–0.01)
Nucleated RBCs: 0 PER 100 WBC
Platelets: 203 10*3/uL (ref 150–400)
RBC: 5.42 M/uL (ref 4.10–5.70)
RDW: 14.6 % — ABNORMAL HIGH (ref 11.5–14.5)
WBC: 6 10*3/uL (ref 4.1–11.1)

## 2021-12-10 LAB — POCT GLUCOSE
POC Glucose: 161 mg/dL — ABNORMAL HIGH (ref 65–117)
POC Glucose: 119 mg/dL — ABNORMAL HIGH (ref 65–117)
POC Glucose: 151 mg/dL — ABNORMAL HIGH (ref 65–117)

## 2021-12-10 MED ORDER — ACETAMINOPHEN 500 MG TAB
500 mg | ORAL_TABLET | Freq: Four times a day (QID) | ORAL | 0 refills | Status: AC
Start: 2021-12-10 — End: ?

## 2021-12-10 MED FILL — INSULIN LISPRO 100 UNIT/ML INJECTION: 100 unit/mL | SUBCUTANEOUS | Qty: 1

## 2021-12-10 MED FILL — ACETAMINOPHEN 500 MG TAB: 500 mg | ORAL | Qty: 2

## 2021-12-10 MED FILL — LISINOPRIL 20 MG TAB: 20 mg | ORAL | Qty: 2

## 2021-12-10 MED FILL — PANTOPRAZOLE 40 MG TAB, DELAYED RELEASE: 40 mg | ORAL | Qty: 1

## 2021-12-10 MED FILL — VIRT-PHOS NEUTRAL 250 MG TABLET: 250 mg | ORAL | Qty: 1

## 2021-12-10 MED FILL — GABAPENTIN 300 MG CAP: 300 mg | ORAL | Qty: 1

## 2021-12-10 MED FILL — INSULIN GLARGINE 100 UNIT/ML INJECTION: 100 unit/mL | SUBCUTANEOUS | Qty: 1

## 2021-12-10 MED FILL — CELECOXIB 100 MG CAP: 100 mg | ORAL | Qty: 1

## 2021-12-10 MED FILL — PROMETHAZINE 25 MG/ML INJECTION: 25 mg/mL | INTRAMUSCULAR | Qty: 1

## 2021-12-10 MED FILL — PHOSPHA NEUTRAL 250 MG TABLET: 250 mg | ORAL | Qty: 1

## 2021-12-10 MED FILL — LABETALOL 100 MG TAB: 100 mg | ORAL | Qty: 1

## 2021-12-10 MED FILL — ENOXAPARIN 40 MG/0.4 ML SUB-Q SYRINGE: 40 mg/0.4 mL | SUBCUTANEOUS | Qty: 0.4

## 2021-12-10 MED FILL — MOVANTIK 12.5 MG TABLET: 12.5 mg | ORAL | Qty: 1

## 2021-12-10 NOTE — Progress Notes (Signed)
I have reviewed discharge instructions with the patient.  The patient verbalized understanding.  Discharge medications reviewed with patient and appropriate educational materials and side effects teaching were provided.

## 2021-12-10 NOTE — Group Note (Signed)
Diabetes  Mgmt by Heinz Knuckles, CNS at 12/10/21 1029                Author: Heinz Knuckles, CNS  Service: Certified Clinical Nurse Specialist  Author Type: Clinical Nurse Specialist       Filed: 12/10/21 1105  Date of Service: 12/10/21 1029  Status: Signed          Editor: Heinz Knuckles, CNS (Clinical Nurse Specialist)               [name]   [facility]   DIABETES MANAGEMENT CONSULT      Consulted by  Evan Majors, MD  for advanced nursing evaluation and care for inpatient blood glucose management.        Evaluation and Action Plan     Evan Shepherd is a 65 year old gentleman with controlled Type 2 Diabetes on high dose Lantus/Novolog, Jardiance and Metformin, admitted with uncontrolled fecal incontinence  now s/p hand-assisted laparoscopic creation of divided loop colostomy, cystoscopy, and bilateral ureteral catheter placement.  His A1C on admission  was at goal at 6.1% and patient diligent with diabetes management PTA.    Inially on admission, all antihyperglycemic agents have been held.  He did have 4mg  decadron x1 in the OR without hyperglycemia.  His fasting BG was 147 and 230 now that oral intake  has started. He benefited from resuming basal insulin at moderate dose.  Now that diet is advancing and eating about half his trays, bolus humalog was resumed- again with a dose reduction (compared to PTA) since intake here is less than his baseline eating  habits prior to surgery.    Please continue current therapy.  Inpatient BG goal is 140-180mg /dl.             Management  Rationale  Action Plan       Medication         Basal needs  Using 0.3 units/kg/D based on obesity    and A1C  Lantus 35 units HS-If BG starts to trend >180 (via our hospital POC checks) advance basal insulin  to 40 units daily        Nutritional needs  Using low sensitivity   5 units Humalog/meal      Hold if patient is NPO or consumes less than 50% of carbohydrates on meal tray     Corrective  insulin  Resistant sensitivity  Resistant Sensitivity ACHS       Additional Orders:   1.  POC glucose ACHS.   Ok for her to wear a continuous glucose monitor (CGM/Dexcom) but all hospital doses should be made off fingerstick glucose values (serum glucose).  CGMs measure  glucose in the interstitial space and there is a lag time bw interstitial glucose and blood glucose.  CGM results can also be impacted by fluid shifts and hypotension.      2.  Diet advancement per primary team.  When advanced, please include consistent carbohydrate component of diet (60 grams CHO/meal)- this  can be added to clear and full liq diets.          Discharge Recommendations:   1.  FUV with endocrinologist per routine.  Asked patient to reach out to endo if BG higher or lower than goal with med adjustments       2.  Reduce Basaglar to 35 units SQ once daily      3.  Resume metformin and jardiance at PTA dose  4.  Stop mealtime humalog- Suspect that daytime BG will be reasonably controlled with metfomin/jardiance on d/c.  He is eating much less  at this time compared to PTA.  Advised patient to work with endocrinologist if he sees that his BG elevated over goal when nutrition and appetite improves on discharge home.                     Initial Presentation     Evan Shepherd is a 64 y.o. male admitted 12/03/21 for surgical management of chronic fecal incontinence.        HX:      Past Medical History:        Diagnosis  Date         ?  Chronic kidney disease, stage I       ?  Dyslipidemia       ?  Erectile dysfunction       ?  Fecal incontinence       ?  GERD (gastroesophageal reflux disease)       ?  History of colonic polyps       ?  History of rectal cancer  2006          Treated with neoadjuvant chemoradiation followed by surgery and then, presumably, adjuvant chemotherapy. The patient appears to have been cured.         ?  Hypertension           ?  Type II diabetes mellitus (HCC)              INITIAL DX:    Fecal incontinence  [R15.9]         Current Treatment        TX: Hand-assisted laparoscopic Creation of divided loop colostomy, Cystoscopy, bilateral  ureteral catheter placement         Hospital Course     Clinical progress has been uncomplicated.    12/6: Hand-assisted laparoscopic Creation of divided loop colostomy, Cystoscopy, bilateral ureteral catheter placement      Diabetes History     Type 2 Diabetes: Diagnosed 20 years ago   Ambulatory BG management provided by: His engocrinologist in [place], [address] Talmage Coin   Family History: Positive for Type 2 Diabetes, Brother with DM2 (passed away at age 24)      Diabetes-related Medical History   Neurological complications   Impotence and Peripheral neuropathy   Microvascular disease   Nephropathy   Other associated conditions      Hx rectal cancer J-pouch to anal anastomosis and diverting loop ileostomy 2006; ileostomy closed 2007      Diabetes Medication History     Key Antihyperglycemic Medications                                empagliflozin (JARDIANCE) 25 mg tablet  (Taking)  Take 25 mg by mouth daily.       insulin glargine (LANTUS,BASAGLAR) 100 unit/mL (3 mL) inpn  (Taking)  52 Units by SubCUTAneous route nightly.           insulin aspart U-100 (NOVOLOG) 100 unit/mL (3 mL) inpn  (Taking)  by SubCUTAneous route Before breakfast, lunch, and dinner. 16 UNITS AT BREAKFAST, 25 UNITS AT LUNCH, 30 UNITS WITH SUPPER                Metformin 850mg  BID      Diabetes self-management practices:  Eating pattern    [x]  Breakfast  Toast- 2 pieces and  tea.  2 hrs later will have 2 eggs   [x]  Lunch    Sandwich or Pita with curry   [x]  Dinner   Pita with rice and curry   [x]  Beverages  Water, tea   Physical activity pattern    Sedentary.  Mostly sits at a desk- lots of activity would exacerbate incontinence.    Monitoring pattern    Started to wear a dexcom G6 4 mos ago    May have a fasting low 1-2 times a month (as low as 47)- otherwise fasting is 110-120    May have a low during the day  "sometimes"- he attributes to taking his mealtime novolog too early before eating    Daytime BG under 160s   Taking medications pattern-Dexcom CGM (currently on left upper arm)   [x]  Consistent administration   [x]  Affordable   Social determinants of health impacting diabetes self-management practices    Concerned that you need to know more about how to stay healthy with diabetes      Overall evaluation:     [x]   Achieving A1c target with drug therapy & self-care practices      History includes: rectal cancer with J-pouch to anal anastomosis and diverting loop ileostomy 2006; ileostomy closed 2007; , and he had good function for the first one or two years after the operation. Gradually, he experienced increasing problems  with fecal incontinence. Plan to leave the j-pouch in situ.      Subjective     I am going to leave today.         Objective     Physical exam   General Obese male in acute post-op pain. Conversant and cooperative   Neuro  Alert, oriented    Vital Signs Visit Vitals      BP  (!) 155/77     Pulse  60     Temp  98.7 ??F (37.1 ??C)     Resp  18     Ht  5\' 10"  (1.778 m)     Wt  102.5 kg (225 lb 15.5 oz)     SpO2  94%        BMI  32.42 kg/m??        Skin  Warm and dry. Acanthosis noted along neckline. No lipohypertrophy or lipoatrophy noted at injection sites. Abdominal scars (healed).  New LLQ colostomy with laparoscopic incisions. Abdomen tender and a little swollen from surgery     Heart   Regular rate and rhythm. No murmurs, rubs or gallops   Lungs  Clear to auscultation without rales or rhonchi   Extremities No foot wounds           Laboratory     Recent Labs             12/10/21   0612  12/09/21   0356  12/08/21   0333     GLU  143*  119*  162*     AGAP  4*  5  2*     WBC  6.0  5.3  5.7          CREA  0.61*  0.53*  0.65*              Factors impacting BG management       Factor  Dose  Comments         Nutrition:   Standard meals  Oral diabetes supplement ordered     60 grams/meal   Low fat/fiber    Eating less than half his trays           Drugs:      Steroids           Decadron          Impairs insulin action         Pain             Other:    Kidney function     GFR over 60          Blood glucose pattern        Significant diabetes-related events over the past 24-72 hours   A1C 6.1%   Fasting BG: 119, 99, 107, 122   Pre-prandial: 124-176   Correction: 6 units in past 24h    decadron x1 OR   Basal: 35 units Lantus QHS   Bolus: 5 units Humalog/meal   Correction: resistant sensitivity 3 units in the last 24h   Reg diet   D/C today        Assessment and Nursing Intervention        Nursing Diagnosis  Risk for unstable blood glucose pattern     Nursing Intervention Domain  5250 Decision-making Support        Nursing Interventions  Examined current inpatient diabetes/blood glucose control    Explored factors facilitating and impeding inpatient management   Explored corrective strategies with patient and responsible inpatient provider    Informed patient of rational for insulin strategy while hospitalized           Nursing Diagnosis  00078 Ineffective Health Management        Nursing Intervention Domain  5250 Decision-making Support        Nursing Interventions  Identified diabetes self-management practices impeding diabetes control   Discussed diabetes survival skills related to   1.  Healthy Plate eating plan; given handouts   2.  Role of physical activity in improving insulin sensitivity and action   3.  Procedure for blood glucose monitoring & options for low-cost products   4.  Medications plan at discharge             Billing Code(s)      [phone]   Before making these care recommendations, I personally reviewed the hospitalization record, including notes, laboratory & diagnostic data and current medications, and examined the patient at  the bedside (circumstances permitting) before making care recommendations. More than fifty (50) percent of the time was spent in patient counseling and/or care  coordination.   Total minutes: 20      Heinz Knuckles, CNS   Diabetes Clinical Nurse Specialist   [facility]   Access via [employer]

## 2021-12-10 NOTE — Discharge Summary (Signed)
Discharge Summary     Patient ID:    Evan Shepherd  [id number]  65 y.o.  08-Oct-1956    Admission Date: December 23, 2021    Discharge Date: 12/10/2021    Admission Diagnoses:  Fecal incontinence  Type II diabetes  Hypertension      Chronic Diagnoses:    Patient Active Problem List   Diagnosis Code    Fecal incontinence R15.9    Type II diabetes mellitus (HCC) E11.9    Hypertension I10       Discharge Diagnoses:  Fecal incontinence (resolved via creation of colostomy)  Type II diabetes  Hypertension  Hypophosphatemia (corrected)      Hospital Problems as of 12/10/2021 Date Reviewed: 23-Dec-2021            Codes Class Noted - Resolved POA    * (Principal) Fecal incontinence (Chronic) ICD-10-CM: R15.9  ICD-9-CM: 787.60  12-23-21 - Present Yes        Type II diabetes mellitus (HCC) (Chronic) ICD-10-CM: E11.9  ICD-9-CM: 250.00  Unknown - Present Yes        Hypertension (Chronic) ICD-10-CM: I10  ICD-9-CM: 401.9  Unknown - Present Yes           Procedures Performed:  Cystoscopy and placement of bilateral ureteral catheters was performed by Dennard Nip, MD on 23-Dec-2021.  Hand-assisted laparoscopic creation of a divided loop colostomy was performed by Dr. Nanda Quinton on 23-Dec-2021.      Discharge Medications:   Current Discharge Medication List        START taking these medications    Details   acetaminophen (TYLENOL) 500 mg tablet Take 2 Tablets by mouth every six (6) hours.  Qty: 100 Tablet, Refills: 0           CONTINUE these medications which have NOT CHANGED    Details   FENUGREEK SEED PO Take  by mouth daily.      fluocinoNIDE (LIDEX) 0.05 % ointment Apply  to affected area daily.      gabapentin (NEURONTIN) 300 mg capsule Take 300 mg by mouth two (2) times a day.      ipratropium (ATROVENT) 42 mcg (0.06 %) nasal spray 2 Sprays by Both Nostrils route two (2) times a day.      cartilage/collagen/bor/hyalur (JOINT HEALTH PO) Take  by mouth daily.      labetaloL (NORMODYNE) 300 mg tablet Take 300 mg by mouth two  (2) times a day.      lactulose (CHRONULAC) 10 gram/15 mL solution Take 10 g by mouth daily.      mesalamine (LIALDA) 1.2 gram delayed release tablet Take  by mouth Daily (before breakfast).      pantoprazole (PROTONIX) 40 mg tablet Take 40 mg by mouth two (2) times a day.      Lactobac no.41/Bifidobact no.7 (PROBIOTIC-10 PO) Take  by mouth daily.      rosuvastatin (CRESTOR) 10 mg tablet Take 10 mg by mouth nightly.      ferrous sulfate (SLOW FE PO) Take  by mouth daily.      TURMERIC PO Take  by mouth daily.      valACYclovir (VALTREX) 1 gram tablet Take 1,000 mg by mouth daily.      cholecalciferol, vitamin D3, 50 mcg (2,000 unit) tab Take  by mouth daily.      empagliflozin (JARDIANCE) 25 mg tablet Take 25 mg by mouth daily.      lisinopriL (PRINIVIL, ZESTRIL) 40 mg tablet Take 40 mg by mouth daily.  insulin glargine (LANTUS,BASAGLAR) 100 unit/mL (3 mL) inpn 52 Units by SubCUTAneous route nightly.      insulin aspart U-100 (NOVOLOG) 100 unit/mL (3 mL) inpn by SubCUTAneous route Before breakfast, lunch, and dinner. 16 UNITS AT BREAKFAST, 25 UNITS AT LUNCH, 30 UNITS WITH SUPPER      amLODIPine (NORVASC) 10 mg tablet Take 10 mg by mouth every evening.      zolpidem (AMBIEN) 10 mg tablet Take  by mouth nightly as needed for Sleep.      metFORMIN (GLUCOPHAGE) 850 mg tablet Take 850 mg by mouth two (2) times daily (with meals).            Diet:  Low fiber diabetic diet for two weeks.    Activity:  No strenuous activity or driving until two weeks after the operation.  No lifting more than 10 lb. until four weeks after the operation. Frequent walking and stair climbing are encouraged.    Wound Care:  None.    Discharge Condition:  Improved compared to that upon admission.    Disposition:  Home with home health.    Follow-up Care:  1.  Dr. Nanda Quinton 12/19/2021 at 10:30 AM  2.  Primary physician in 1-2 weeks      Significant Diagnostic Studies:   Recent Labs     12/10/21  0612 12/09/21  0356   WBC 6.0 5.3   HGB 13.6  13.2   HCT 43.3 41.2   PLT 203 188     Recent Labs     12/10/21  0612 12/09/21  0356 12/08/21  0333   NA 139 141 140   K 3.9 3.6 3.7   CL 112* 112* 113*   CO2 23 24 25    BUN 8 6 9    CREA 0.61* 0.53* 0.65*   GLU 143* 119* 162*   CA 8.3* 7.9* 7.9*   MG  --   --  1.8   PHOS  --  2.7 2.3*       Lab Results   Component Value Date/Time    Glucose (POC) 119 (H) 12/10/2021 06:11 AM    Glucose (POC) 161 (H) 12/09/2021 09:13 PM    Glucose (POC) 124 (H) 12/09/2021 04:39 PM    Glucose (POC) 176 (H) 12/09/2021 11:31 AM    Glucose (POC) 99 12/09/2021 06:46 AM         HOSPITAL COURSE & TREATMENT RENDERED:     The patient is a 65 year old male who underwent 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  The ileostomy was closed in 2007, and he had good function for the first 1 or 2 years after the operation.  Gradually, he experienced increasing problems with fecal incontinence.  That incontinence has now become severe.  He has multiple bowel movements per day with poor control.  He underwent a hemorrhoid operation about 3 years ago that seemed to help a bit.  He has tried multiple agents to modify the characteristics of his stools and has undergone pelvic floor physical therapy without benefit.  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  Both resting and squeezing pressures were quite low, and the balloon expulsion test failed in that the patient was not able to retain the balloon long enough for that to be performed.  An MRI of the pelvis  was performed on 09/07/2021, and this study was misinterpreted as revealing a side-to-side rectosigmoid anastomosis.  It revealed the patient's penile implant reservoir in the right pelvis.  Most importantly, it did not reveal any evidence of recurrent malignancy.   The patient's last colonoscopy was performed on 11/28/2020, and it revealed no neoplasms.  On 10/16/2021, we had a detailed discussion about the surgical treatment options for his intractable fecal incontinence.  Ultimately, what I recommended was excision of the colonic J-pouch and creation of a permanent colostomy.  We planned on proceeding with that operation, but the patient asked to return to discuss it further.  On 11/18/2021, he and his family asked several appropriate questions.  The conversation lasted for approximately 30 minutes and included having a relative, who is a physician, on speaker phone as we discussed the advantages and disadvantages of excision of the colonic J-pouch versus leaving the pouch in situ and just creating a colostomy.  In the end, we decided to proceed with the creation of a divided loop colostomy without performing the excision of the colonic J-pouch.  By staying out of the pelvis to whatever extent possible, it was hoped that unnecessary risks and complications would be avoided.  The risks of the planned operation had been discussed in detail, and the patient agreed to proceed.    He was taken to the operating room on 12/03/2021, and there the above-listed procedures were performed without apparent complications. Post-operatively, he was transported in stable condition from the recovery room to the floor.    His hospital course was essentially unremarkable except for a large volume of emesis on POD#3. He experienced a gradual return of bowel function, and his diet was advanced appropriately. On POD#7, he was hemodynamically stable and afebrile, tolerating a low fiber diet, passing flatus and stool, and ambulating well.  There was no physical or laboratory evidence of infection or other complication, and he had received multiple ostomy education sessions.  At this point he was judged to be fit for discharge.    His condition upon discharge was improved compared to that upon  admission, and he appeared to have understood all discharge and follow-up instructions.      Signed:  Theresia Majors, MD

## 2021-12-10 NOTE — Progress Notes (Signed)
RUR: 7% Low    TOC: Anticipated discharge home. [place]; home health will be arranged with patient's PCP. Patient's wife/sister will provide transport home once medically stable. Follow-up with PCP/specialist.      Primary Contact: Wife, Kasaan Spampinato, [phone]    Medicare pt has received, reviewed, and signed 2nd IM letter informing them of their right to appeal the discharge.  Signed copied has been placed on pt bedside chart.     12/9 note:  CM unable to secure home health due to patient living out of state. CM provided patient and wife HH SN order, H&P, wound care note and face sheet in order to provide to his PCP in [place] to arrange home health SN services. CM also faxed copies to patient's PCP office. Patient and wife provided fax confirmation sheet indicating fax was successfully sent to PCP.    Leodis Sias, MSW   [phone]

## 2021-12-31 ENCOUNTER — Encounter (HOSPITAL_COMMUNITY)
Admission: RE | Admit: 2021-12-31 | Discharge: 2021-12-31 | Disposition: A | Discharge status: Home / Self Care | Payer: Medicare HMO | Source: Ambulatory Visit | Attending: Internal Medicine | Admitting: Internal Medicine

## 2021-12-31 DIAGNOSIS — Z01812 Encounter for preprocedural laboratory examination: Secondary | ICD-10-CM | POA: Insufficient documentation

## 2021-12-31 DIAGNOSIS — Z933 Colostomy status: Secondary | ICD-10-CM | POA: Insufficient documentation

## 2021-12-31 DIAGNOSIS — L24B3 Irritant contact dermatitis related to fecal or urinary stoma or fistula: Secondary | ICD-10-CM | POA: Diagnosis not present

## 2021-12-31 DIAGNOSIS — K9400 Colostomy complication, unspecified: Secondary | ICD-10-CM

## 2021-12-31 DIAGNOSIS — L24B1 Irritant contact dermatitis related to digestive stoma or fistula: Secondary | ICD-10-CM

## 2021-12-31 NOTE — Progress Notes (Signed)
[facility]   Reason for visit:  LLQ loop colostomy HPI:  Fecal incontinence with creation of diverting loop colostomy ROS  Review of Systems  Gastrointestinal:        LLQ colostomy  Skin:  Positive for rash.       Irritant contact dermatitis to peristomal skin  All other systems reviewed and are negative. Vital signs:  BP 136/83 (BP Location: Right Arm)    Pulse 79    Temp 98 F (36.7 C) (Oral)    Resp 18    SpO2 96%  Exam:  Physical Exam Vitals reviewed.  Constitutional:      Appearance: He is normal weight.  Abdominal:     Palpations: Abdomen is soft.  Skin:    Comments: Breakdown to peristomal skin  Neurological:     General: No focal deficit present.     Mental Status: He is alert.  Psychiatric:        Mood and Affect: Mood normal.        Behavior: Behavior normal.    Stoma type/location:  LLQ colostomy Stomal assessment/size:  1 3/8" pink stoma, flush from 4 to 7 o'clock.   Peristomal assessment:  denuded skin, partial thickness tissue loss present circumferentially. Cutting barrier opening too large.  We measure and I send home with a pattern for appropriate size.  Treatment options for stomal/peristomal skin: Flat pouch with barrier ring.  Would like to add barrier ring and keep 1 piece flat.  Output: soft brown stool Ostomy pouching: 1pc. Flat  add barrier ring. Demonstrate crusting with stoma powder and skin prep.   Education provided:  pouch change.  Measuring stoma, skin protection with powder and skin prep and need for barrier ring due to flush stoma on lower half.     Impression/dx  Contact dermatitis Discussion  Cutting barrier to appropriate size and adding barrier ring. Plan  See back 01/14/21    Visit time: 50 minutes.   Domenic Moras FNP-BC

## 2021-12-31 NOTE — Discharge Instructions (Signed)
Continue 1 piece flat pouch Add barrier ring Continue skin prep and powder See back 1/17/

## 2022-01-06 DIAGNOSIS — Z933 Colostomy status: Secondary | ICD-10-CM | POA: Diagnosis not present

## 2022-01-10 ENCOUNTER — Ambulatory Visit (HOSPITAL_COMMUNITY)
Admission: RE | Admit: 2022-01-10 | Discharge: 2022-01-10 | Disposition: A | Discharge status: Home / Self Care | Payer: Medicare HMO | Source: Ambulatory Visit | Attending: Nurse Practitioner | Admitting: Nurse Practitioner

## 2022-01-10 DIAGNOSIS — L738 Other specified follicular disorders: Secondary | ICD-10-CM | POA: Diagnosis not present

## 2022-01-10 DIAGNOSIS — R159 Full incontinence of feces: Secondary | ICD-10-CM | POA: Insufficient documentation

## 2022-01-10 DIAGNOSIS — L259 Unspecified contact dermatitis, unspecified cause: Secondary | ICD-10-CM | POA: Diagnosis not present

## 2022-01-10 DIAGNOSIS — Z933 Colostomy status: Secondary | ICD-10-CM | POA: Diagnosis present

## 2022-01-10 DIAGNOSIS — L739 Follicular disorder, unspecified: Secondary | ICD-10-CM | POA: Insufficient documentation

## 2022-01-10 DIAGNOSIS — Z433 Encounter for attention to colostomy: Secondary | ICD-10-CM | POA: Insufficient documentation

## 2022-01-10 DIAGNOSIS — L24B1 Irritant contact dermatitis related to digestive stoma or fistula: Secondary | ICD-10-CM | POA: Diagnosis not present

## 2022-01-10 DIAGNOSIS — K9400 Colostomy complication, unspecified: Secondary | ICD-10-CM | POA: Diagnosis not present

## 2022-01-10 NOTE — Progress Notes (Signed)
[facility]   Reason for visit:  Contact dermatitis to LLQ colostomy peristomal skin HPI:  Fecal incontinence with loop colostomy ROS  Review of Systems  Gastrointestinal:        LLQ colostomy  Skin:  Positive for rash.       Contact dermatitis folliculitis   Neurological: Negative.   Psychiatric/Behavioral: Negative.    All other systems reviewed and are negative. Vital signs:  BP 130/67    Pulse 72    Temp 98.2 F (36.8 C) (Oral)    Resp 17    SpO2 96%  Exam:  Physical Exam Vitals reviewed.  Constitutional:      Appearance: Normal appearance. He is normal weight.  Abdominal:     Palpations: Abdomen is soft.  Skin:    Findings: Rash present.     Comments: Peristomal irritation  contact dermatitis  Folliculitis   Neurological:     General: No focal deficit present.     Mental Status: He is alert and oriented to person, place, and time. Mental status is at baseline.  Psychiatric:        Mood and Affect: Mood normal.        Behavior: Behavior normal.    Stoma type/location:  1 1/4" pink and moist Stomal assessment/size:  Flush on bottom half, os pointed down toward 6:00   Peristomal assessment:  dermatitis to peristomal skin, improving Folliculitis to peristomal skin from 3 to 9:00  Wife is clipping hair close to stoma and using electric razor on the outer perimeter.  Encouraged to wait longer between clipping hair since not very thick.   Treatment options for stomal/peristomal skin: Stoma powder and no sting skin prep Half barrier ring on bottom half to promote seal and prevent leakage.  Patient does not wish to try convex pouch at this time.  Output: soft brown stool Ostomy pouching: 1pc.flat coloplast pouch with 1/2 barrier ring and stoma powder/skin prep Education provided:  see above. Flagged flexible one piece convex pouch in catalog in case this continues, patient is willing to try convexity again.  Will request samples be mailed to patient.       Impression/dx  Folliculitis Contact dermatitis, improving Discussion  Add 1/2 barrier ring Consider flexible convex pouch Plan  See back 01/21/22    Visit time: 55 minutes.   Domenic Moras FNP-BC

## 2022-01-10 NOTE — Discharge Instructions (Addendum)
Add 1/2 barrier ring Samples of flexible convex Shave hair every 10-14 days   Appt scheduled 1/24

## 2022-01-14 ENCOUNTER — Ambulatory Visit (HOSPITAL_COMMUNITY): Payer: Medicare HMO

## 2022-01-16 DIAGNOSIS — Z933 Colostomy status: Secondary | ICD-10-CM | POA: Diagnosis not present

## 2022-01-16 DIAGNOSIS — G5621 Lesion of ulnar nerve, right upper limb: Secondary | ICD-10-CM | POA: Diagnosis not present

## 2022-01-21 ENCOUNTER — Ambulatory Visit (HOSPITAL_COMMUNITY): Payer: Medicare HMO

## 2022-01-27 ENCOUNTER — Encounter (HOSPITAL_COMMUNITY)
Admission: RE | Admit: 2022-01-27 | Discharge: 2022-01-27 | Disposition: A | Discharge status: Home / Self Care | Payer: Medicare HMO | Source: Ambulatory Visit | Attending: Nurse Practitioner | Admitting: Nurse Practitioner

## 2022-01-27 ENCOUNTER — Other Ambulatory Visit: Payer: Self-pay

## 2022-01-27 DIAGNOSIS — L259 Unspecified contact dermatitis, unspecified cause: Secondary | ICD-10-CM | POA: Insufficient documentation

## 2022-01-27 DIAGNOSIS — L24B1 Irritant contact dermatitis related to digestive stoma or fistula: Secondary | ICD-10-CM

## 2022-01-27 DIAGNOSIS — K9400 Colostomy complication, unspecified: Secondary | ICD-10-CM | POA: Diagnosis not present

## 2022-01-27 DIAGNOSIS — Z433 Encounter for attention to colostomy: Secondary | ICD-10-CM | POA: Insufficient documentation

## 2022-01-27 NOTE — Progress Notes (Signed)
[facility]   Reason for visit:  LLQ colostomy, flush stoma on lower half HPI:  Fecal incontinence with loop colostomy ROS  Review of Systems  Gastrointestinal:        LLQ colostomy  Skin:  Positive for rash.       Darkened discoloration around stoma Peristomal dermatitis  Psychiatric/Behavioral: Negative.    All other systems reviewed and are negative. Vital signs:  BP 133/71 (BP Location: Right Arm)    Pulse 68    Temp (!) 97.5 F (36.4 C) (Oral)    Resp 18    SpO2 98%  Exam:  Physical Exam  Stoma type/location:  LLQ colostomy, flush on lower half.  Contact dermatitis has improved but still present. Stomal assessment/size:  1 1/4", but  but is less round today. Is flush on lower half but does not want to wear convex pouch.   We discuss using barrier ring or strip paste on the bottom to ensure proper fit and improve wear time.  Peristomal assessment:  denuded on bottom half and darkened/discolored Treatment options for stomal/peristomal skin: strip paste or 1/2 barrier ring Stoma powder and skin prep to peristomal skin Output: soft brown stool Ostomy pouching: 1pc flat coloplast with stoma powder and skin prep.  Education provided:  See above    Impression/dx  Contact dermatitis Flush stoma Discussion  Can continue flat pouch, build up flush area with strip paste Plan  See back in one month    Visit time: 45 minutes.   Domenic Moras FNP-BC

## 2022-01-27 NOTE — Discharge Instructions (Addendum)
Continue flat coloplast pouch Stoma powder and skin prep to peristomal skin

## 2022-01-28 ENCOUNTER — Ambulatory Visit (HOSPITAL_COMMUNITY): Payer: Medicare HMO

## 2022-02-04 DIAGNOSIS — K6289 Other specified diseases of anus and rectum: Secondary | ICD-10-CM | POA: Diagnosis not present

## 2022-02-04 DIAGNOSIS — K623 Rectal prolapse: Secondary | ICD-10-CM | POA: Diagnosis not present

## 2022-02-08 DIAGNOSIS — E1165 Type 2 diabetes mellitus with hyperglycemia: Secondary | ICD-10-CM | POA: Diagnosis not present

## 2022-02-12 ENCOUNTER — Ambulatory Visit
Admission: RE | Admit: 2022-02-12 | Discharge: 2022-02-12 | Disposition: A | Discharge status: Home / Self Care | Payer: Medicare HMO | Source: Ambulatory Visit | Attending: Internal Medicine | Admitting: Internal Medicine

## 2022-02-12 ENCOUNTER — Other Ambulatory Visit: Payer: Self-pay | Admitting: Internal Medicine

## 2022-02-12 DIAGNOSIS — K9400 Colostomy complication, unspecified: Secondary | ICD-10-CM

## 2022-02-12 DIAGNOSIS — K429 Umbilical hernia without obstruction or gangrene: Secondary | ICD-10-CM | POA: Diagnosis not present

## 2022-02-12 DIAGNOSIS — I700 Atherosclerosis of aorta: Secondary | ICD-10-CM | POA: Diagnosis not present

## 2022-02-12 DIAGNOSIS — K760 Fatty (change of) liver, not elsewhere classified: Secondary | ICD-10-CM | POA: Diagnosis not present

## 2022-02-12 DIAGNOSIS — M47816 Spondylosis without myelopathy or radiculopathy, lumbar region: Secondary | ICD-10-CM | POA: Diagnosis not present

## 2022-02-12 MED ORDER — IOPAMIDOL (ISOVUE-300) INJECTION 61%
100.0000 mL | Freq: Once | INTRAVENOUS | Status: AC | PRN
  Administered 2022-02-12: 100 mL via INTRAVENOUS

## 2022-02-13 ENCOUNTER — Telehealth: Payer: Self-pay | Admitting: Gastroenterology

## 2022-02-13 ENCOUNTER — Encounter: Payer: Self-pay | Admitting: Gastroenterology

## 2022-02-13 NOTE — Telephone Encounter (Signed)
Inbound call from patient wife. Would like a call back to discuss treatment plan and opinion about upcoming proctoplasty surgery

## 2022-02-13 NOTE — Telephone Encounter (Signed)
Pt sent My Chart message with same request. Addressed in My Chart encounter. Closing as duplicate.

## 2022-02-14 ENCOUNTER — Ambulatory Visit (HOSPITAL_COMMUNITY): Payer: Medicare HMO

## 2022-02-17 ENCOUNTER — Ambulatory Visit (HOSPITAL_COMMUNITY)
Admission: RE | Admit: 2022-02-17 | Discharge: 2022-02-17 | Disposition: A | Discharge status: Home / Self Care | Payer: Medicare HMO | Source: Ambulatory Visit | Attending: Internal Medicine | Admitting: Internal Medicine

## 2022-02-17 ENCOUNTER — Other Ambulatory Visit: Payer: Self-pay

## 2022-02-17 DIAGNOSIS — K9400 Colostomy complication, unspecified: Secondary | ICD-10-CM

## 2022-02-17 DIAGNOSIS — Z933 Colostomy status: Secondary | ICD-10-CM | POA: Insufficient documentation

## 2022-02-17 DIAGNOSIS — L24B1 Irritant contact dermatitis related to digestive stoma or fistula: Secondary | ICD-10-CM

## 2022-02-17 DIAGNOSIS — K623 Rectal prolapse: Secondary | ICD-10-CM | POA: Diagnosis not present

## 2022-02-17 NOTE — Progress Notes (Signed)
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  Presents with prolapsed rectum.  HPI:  Fecal incontinence with Loop colostomy  ROS  Review of Systems  Constitutional:  Positive for fatigue.  Gastrointestinal:  Positive for constipation and rectal pain.       LLQ loop colostomy States his output was less today.  We discuss increasing miralax to daily.  He rectum has prolapsed and he is having this repaired 03/06/22.  Mucus fistula has been more productive.  He is on Cipro and Flagyl.  I feel the prolapse may be causing increased inflammation and increasing mucus production.  Producing thick yellow or green effluent.  OS points down at 6 oclock to mucus fistula.  Colostomy os points straight at center.   Skin:  Positive for color change.       Peristomal skin is darkened and intact circumferentially.  (See photo)   Psychiatric/Behavioral:  The patient is nervous/anxious.        Concern about increased mucus production.  Has negative Abd CT.  Waiting on culture results   All other systems reviewed and are negative. Vital signs:  BP (!) 155/91 (BP Location: Right Arm)    Pulse 79    Temp 98.5 F (36.9 C) (Oral)    Resp 17    SpO2 97%  Exam:  Physical Exam Vitals reviewed.  Constitutional:      Appearance: He is normal weight.  Abdominal:     Palpations: Abdomen is soft.     Comments: Bowel sounds sluggish today.   Genitourinary:    Comments: Rectal prolapse noted.  History hemorrhoid repair Skin:    General: Skin is warm and dry.  Neurological:     Mental Status: He is alert and oriented to person, place, and time.    Stoma type/location:  LLQ colostomy and mucus fistula Stomal assessment/size:  1 3/8"  Peristomal assessment:  skin is intact and darkened, protect lower half with piece of barrier strip, flattened. Convex 1 piece pouch used.   Treatment options for stomal/peristomal skin: skin barrier strip, stoma  powder and 1 piece pouch Output: decreased  scant green output from functional end of stoma.  Light green mucus from mucus fistula Ostomy pouching: 1pc.convex coloplast Education provided:  Discussed that due to labs WNL, CT negative and newly onset rectal prolapse, the increased mucus production is due to inflammation from the prolapse.  HE is also having this mucus from his rectum.  He has a follow up appointment with surgery for repair.     Impression/dx  Loop colostomy with increased mucus production Rectal prolapse Contact dermatitis Discussion  Follow up with surgeon Awaiting culture results Plan  See back as needed.     Visit time: 50 minutes.   Domenic Moras FNP-BC

## 2022-02-18 ENCOUNTER — Ambulatory Visit (HOSPITAL_COMMUNITY): Payer: Medicare HMO

## 2022-02-18 ENCOUNTER — Emergency Department (HOSPITAL_COMMUNITY): Payer: Medicare HMO

## 2022-02-18 ENCOUNTER — Other Ambulatory Visit: Payer: Self-pay

## 2022-02-18 ENCOUNTER — Encounter (HOSPITAL_COMMUNITY): Payer: Self-pay

## 2022-02-18 ENCOUNTER — Emergency Department (HOSPITAL_COMMUNITY)
Admission: EM | Admit: 2022-02-18 | Discharge: 2022-02-18 | Disposition: A | Discharge status: Home / Self Care | Payer: Medicare HMO | Attending: Emergency Medicine | Admitting: Emergency Medicine

## 2022-02-18 DIAGNOSIS — K3189 Other diseases of stomach and duodenum: Secondary | ICD-10-CM | POA: Diagnosis not present

## 2022-02-18 DIAGNOSIS — L905 Scar conditions and fibrosis of skin: Secondary | ICD-10-CM | POA: Diagnosis not present

## 2022-02-18 DIAGNOSIS — Z85048 Personal history of other malignant neoplasm of rectum, rectosigmoid junction, and anus: Secondary | ICD-10-CM | POA: Insufficient documentation

## 2022-02-18 DIAGNOSIS — R110 Nausea: Secondary | ICD-10-CM | POA: Insufficient documentation

## 2022-02-18 DIAGNOSIS — R7309 Other abnormal glucose: Secondary | ICD-10-CM | POA: Diagnosis not present

## 2022-02-18 DIAGNOSIS — K6389 Other specified diseases of intestine: Secondary | ICD-10-CM | POA: Diagnosis not present

## 2022-02-18 DIAGNOSIS — R1030 Lower abdominal pain, unspecified: Secondary | ICD-10-CM

## 2022-02-18 DIAGNOSIS — K55069 Acute infarction of intestine, part and extent unspecified: Secondary | ICD-10-CM | POA: Diagnosis not present

## 2022-02-18 DIAGNOSIS — R1032 Left lower quadrant pain: Secondary | ICD-10-CM | POA: Diagnosis not present

## 2022-02-18 LAB — URINALYSIS, ROUTINE W REFLEX MICROSCOPIC
Bacteria, UA: NONE SEEN
Bilirubin Urine: NEGATIVE
Glucose, UA: >=500 mg/dL — AB
Hgb urine dipstick: NEGATIVE
Ketones, ur: NEGATIVE mg/dL
Leukocytes,Ua: NEGATIVE
Nitrite: NEGATIVE
Protein, ur: 30 mg/dL — AB
Specific Gravity, Urine: 1.024 (ref 1.005–1.030)
pH: 6 (ref 5.0–8.0)

## 2022-02-18 LAB — COMPREHENSIVE METABOLIC PANEL
ALT: 17 U/L (ref 0–44)
AST: 22 U/L (ref 15–41)
Albumin: 3.3 g/dL — ABNORMAL LOW (ref 3.5–5.0)
Alkaline Phosphatase: 47 U/L (ref 38–126)
Anion gap: 9 (ref 5–15)
BUN: 13 mg/dL (ref 8–23)
CO2: 24 mmol/L (ref 22–32)
Calcium: 8.7 mg/dL — ABNORMAL LOW (ref 8.9–10.3)
Chloride: 103 mmol/L (ref 98–111)
Creatinine, Ser: 0.89 mg/dL (ref 0.61–1.24)
GFR, Estimated: >60 mL/min (ref >60)
Glucose, Bld: 170 mg/dL — ABNORMAL HIGH (ref 70–99)
Potassium: 3.6 mmol/L (ref 3.5–5.1)
Sodium: 136 mmol/L (ref 135–145)
Total Bilirubin: 0.4 mg/dL (ref 0.3–1.2)
Total Protein: 6.6 g/dL (ref 6.5–8.1)

## 2022-02-18 LAB — CBC WITH DIFFERENTIAL/PLATELET
Abs Immature Granulocytes: 0.03 10*3/uL (ref 0.00–0.07)
Basophils Absolute: 0 10*3/uL (ref 0.0–0.1)
Basophils Relative: 1 %
Eosinophils Absolute: 0.2 10*3/uL (ref 0.0–0.5)
Eosinophils Relative: 2 %
HCT: 45.8 % (ref 39.0–52.0)
Hemoglobin: 14.9 g/dL (ref 13.0–17.0)
Immature Granulocytes: 0 %
Lymphocytes Relative: 36 %
Lymphs Abs: 3.1 10*3/uL (ref 0.7–4.0)
MCH: 25.7 pg — ABNORMAL LOW (ref 26.0–34.0)
MCHC: 32.5 g/dL (ref 30.0–36.0)
MCV: 79.1 fL — ABNORMAL LOW (ref 80.0–100.0)
Monocytes Absolute: 0.6 10*3/uL (ref 0.1–1.0)
Monocytes Relative: 7 %
Neutro Abs: 4.7 10*3/uL (ref 1.7–7.7)
Neutrophils Relative %: 54 %
Platelets: 264 10*3/uL (ref 150–400)
RBC: 5.79 MIL/uL (ref 4.22–5.81)
RDW: 16.3 % — ABNORMAL HIGH (ref 11.5–15.5)
WBC: 8.6 10*3/uL (ref 4.0–10.5)
nRBC: 0 % (ref 0.0–0.2)

## 2022-02-18 LAB — LIPASE, BLOOD: Lipase: 34 U/L (ref 11–51)

## 2022-02-18 MED ORDER — IOHEXOL 300 MG/ML  SOLN
100.0000 mL | Freq: Once | INTRAMUSCULAR | Status: AC | PRN
Start: 2022-02-18 — End: 2022-02-18
  Administered 2022-02-18: 100 mL via INTRAVENOUS

## 2022-02-18 NOTE — Discharge Instructions (Signed)
Follow up with surgeon Back to ostomy clinic with pouching questions or concerns

## 2022-02-18 NOTE — Discharge Instructions (Addendum)
You were seen for 2 days of lower abdominal pain, decreased stool output.  You had lab work that was unremarkable.  Your CT did not show any definite abscess.  Some dilated small bowel loops that may be an ileus or partial small bowel obstruction.  Your symptoms were improving so I think it is reasonable that you can follow-up outpatient with your treatment team.  I have also given you contact information for our surgical team in [place].  Please return if any high fevers or worsening symptoms.

## 2022-02-18 NOTE — ED Triage Notes (Signed)
Pt. Stated, Im a cancer survivor from colon cancer and doing fine with a colostomy bag . Yesterday I started having some abdominal pain and did not have any stool in my bag and then it finally came through. I have some stomach pain with nausea. My Dr. In [place] told me to come to the hospital

## 2022-02-18 NOTE — ED Provider Notes (Signed)
[facility] EMERGENCY DEPARTMENT Provider Note   CSN: [id number] Arrival date & time: 02/18/22  1325     History  Chief Complaint  Patient presents with   Abdominal Pain   Nausea    Phillip Tate is a 66 y.o. male.  He had a history of rectal cancer that required excision and colostomy.  He has had what sounds like a reanastomosis and has required multiple surgeries.  He continues with a J-pouch fistula.  He was recently placed on antibiotics after a CAT scan for possible infection.  He said starting 2 days ago he had lower abdominal pain associated with nausea and distention.  He talked to his surgeon who recommended he come to get a CAT scan.  No fevers.  Was initially not passing stool but now is passing stool and mucus again.  No bleeding.  The history is provided by the patient.  Abdominal Pain Pain location:  LLQ Pain quality: aching   Pain severity:  Severe Onset quality:  Gradual Duration:  2 days Timing:  Constant Progression:  Improving Context: not sick contacts and not trauma   Relieved by:  Bowel activity Worsened by:  Nothing Ineffective treatments:  None tried Associated symptoms: nausea   Associated symptoms: no chest pain, no cough, no fever and no vomiting   Risk factors: multiple surgeries       Home Medications Prior to Admission medications   Medication Sig Start Date End Date Taking? Authorizing Provider  AMBULATORY NON FORMULARY MEDICATION Medication Name: Nitroglycerin 0.125% gel. Apply pea size amount to the rectum three times a day for 6-8 weeks 11/28/20   Thornton Park, MD  amLODipine (NORVASC) 10 MG tablet Take 10 mg by mouth every morning.     [provider]  Cholecalciferol (VITAMIN D3) 50 MCG (2000 UT) capsule 1 capsule    [provider]  colestipol (COLESTID) 1 g tablet Take 1 tablet (1 g total) by mouth 2 (two) times daily. 03/12/21   Thornton Park, MD  dicyclomine (BENTYL) 20 MG tablet  Take 20 mg by mouth every 6 (six) hours. As needed    [provider]  empagliflozin (JARDIANCE) 25 MG TABS tablet Take 25 mg by mouth daily.    [provider]  Ferrous Sulfate (SLOW FE) 142 (45 Fe) MG TBCR 1 tablet    [provider]  fish oil-omega-3 fatty acids 1000 MG capsule Take 1,500 mg by mouth daily.    [provider]  fluocinonide cream (LIDEX) 0.05 % See admin instructions. 03/23/17   [provider]  gabapentin (NEURONTIN) 300 MG capsule Take 300 mg by mouth 3 (three) times daily.  08/02/16   [provider]  ibuprofen (ADVIL,MOTRIN) 200 MG tablet Take 200 mg by mouth every 6 (six) hours as needed for fever or moderate pain.    [provider]  insulin glargine (LANTUS) 100 UNIT/ML injection Inject 40-42 Units into the skin at bedtime.     [provider]  insulin lispro (HUMALOG) 100 UNIT/ML injection Inject 16-40 Units into the skin 3 (three) times daily before meals. Sliding scan  16units am, 25-30units lunch. 35-40units supper    [provider]  INVOKANA 300 MG TABS tablet Take 300 mg by mouth daily before breakfast. 07/10/16   [provider]  ipratropium (ATROVENT) 0.06 % nasal spray 2 sprays in each nostril    [provider]  labetalol (NORMODYNE) 300 MG tablet Take 300 mg by mouth 2 (two)  times daily.    [provider]  lactulose (CEPHULAC) 10 g packet Take 10 g by mouth every other day.    [provider]  lisinopril (PRINIVIL,ZESTRIL) 40 MG tablet Take 40 mg by mouth every evening.     [provider]  mesalamine (LIALDA) 1.2 g EC tablet Take 2 tablets (2.4 g total) by mouth daily with breakfast. 09/24/21   Thornton Park, MD  metFORMIN (GLUCOPHAGE) 1000 MG tablet Take 1,000 mg by mouth 2 (two) times daily with a meal.    [provider]  Multiple Vitamin (MULTIVITAMIN) tablet Take 1 tablet by mouth daily.    [provider]   NOVOFINE 30G X 8 MM MISC Inject 1 packet into the skin as needed. BLOOD SUGARS 07/10/16   [provider]  NOVOLOG FLEXPEN 100 UNIT/ML FlexPen Inject 90 Units into the skin 3 (three) times daily with meals.  07/18/16   [provider]  pantoprazole (PROTONIX) 40 MG tablet Take 1 tablet (40 mg total) by mouth 2 (two) times daily. 07/04/21   Thornton Park, MD  polyvinyl alcohol (LIQUIFILM TEARS) 1.4 % ophthalmic solution Place 1 drop into both eyes as needed for dry eyes.    [provider]  rosuvastatin (CRESTOR) 10 MG tablet Take 1 tablet (10 mg total) by mouth daily. 09/18/16   Jettie Booze, MD  SYNJARDY XR 25-1000 MG TB24  04/10/21   [provider]  VALACYCLOVIR HCL PO Take 1 mg by mouth daily. 10/05/15   [provider]  zolpidem (AMBIEN) 10 MG tablet Take 10 mg by mouth at bedtime. 10/01/15   [provider]      Allergies    Cardizem cd [diltiazem hcl er beads], Toprol xl [metoprolol], and Tricor [fenofibrate]    Review of Systems   Review of Systems  Constitutional:  Negative for fever.  Respiratory:  Negative for cough.   Cardiovascular:  Negative for chest pain.  Gastrointestinal:  Positive for abdominal pain and nausea. Negative for vomiting.   Physical Exam Updated Vital Signs BP 126/77    Pulse 75    Temp 99 F (37.2 C) (Oral)    Resp 14    Ht 5\' 10"  (1.778 m)    Wt 68 kg    SpO2 96%    BMI 21.52 kg/m  Physical Exam Vitals and nursing note reviewed.  Constitutional:      General: He is not in acute distress.    Appearance: He is well-developed.  HENT:     Head: Normocephalic and atraumatic.  Eyes:     Conjunctiva/sclera: Conjunctivae normal.  Cardiovascular:     Rate and Rhythm: Normal rate and regular rhythm.     Heart sounds: No murmur heard. Pulmonary:     Effort: Pulmonary effort is normal. No respiratory distress.     Breath sounds: Normal breath sounds.  Abdominal:     General: A surgical scar is  present.     Palpations: Abdomen is soft.     Tenderness: There is no abdominal tenderness. There is no guarding or rebound.     Comments: He has multiple surgical scars.  He has a colostomy in his left lower quadrant with a pink well-appearing stoma.  Musculoskeletal:        General: No swelling.     Cervical back: Neck supple.  Skin:    General: Skin is warm and dry.     Capillary Refill: Capillary refill takes less than 2 seconds.  Neurological:  General: No focal deficit present.     Mental Status: He is alert.    ED Results / Procedures / Treatments   Labs (all labs ordered are listed, but only abnormal results are displayed) Labs Reviewed  CBC WITH DIFFERENTIAL/PLATELET - Abnormal; Notable for the following components:      Result Value   MCV 79.1 (*)    MCH 25.7 (*)    RDW 16.3 (*)    All other components within normal limits  COMPREHENSIVE METABOLIC PANEL - Abnormal; Notable for the following components:   Glucose, Bld 170 (*)    Calcium 8.7 (*)    Albumin 3.3 (*)    All other components within normal limits  URINALYSIS, ROUTINE W REFLEX MICROSCOPIC - Abnormal; Notable for the following components:   Glucose, UA >=500 (*)    Protein, ur 30 (*)    All other components within normal limits  LIPASE, BLOOD    EKG None  Radiology CT Abdomen Pelvis W Contrast  Result Date: 02/18/2022 CLINICAL DATA:  Colostomy site is oozing poss. Evaluate for bowel obstruction. History of rectal cancer. EXAM: CT ABDOMEN AND PELVIS WITH CONTRAST TECHNIQUE: Multidetector CT imaging of the abdomen and pelvis was performed using the standard protocol following bolus administration of intravenous contrast. RADIATION DOSE REDUCTION: This exam was performed according to the departmental dose-optimization program which includes automated exposure control, adjustment of the mA and/or kV according to patient size and/or use of iterative reconstruction technique. CONTRAST:  139mL OMNIPAQUE  IOHEXOL 300 MG/ML  SOLN COMPARISON:  02/12/2022, 07/12/2010 FINDINGS: Lower chest: No acute abnormality. Hepatobiliary: No focal liver abnormality is seen. No gallstones, gallbladder wall thickening, or biliary dilatation. Pancreas: Unremarkable. No pancreatic ductal dilatation or surrounding inflammatory changes. Spleen: Normal in size without focal abnormality. Adrenals/Urinary Tract: Adrenal glands are unremarkable. Kidneys are normal, without renal calculi, focal lesion, or hydronephrosis. Bladder is unremarkable. Stomach/Bowel: Stomach is normal. Focal area of inflammatory changes in the left upper omentum similar to the prior examination of 02/12/2022 likely reflecting an omental infarct. Left mid abdominal colostomy. No drainable fluid collection in the region of the colostomy to suggest an abscess. Multiple distended fluid-filled loops of small bowel in the lower mid abdomen measuring up to 3 cm which may reflect an ileus versus partial small bowel obstruction. No pneumatosis, pneumoperitoneum or portal venous gas. Vascular/Lymphatic: Normal caliber abdominal aorta with mild atherosclerosis. No lymphadenopathy. Reproductive: Prostate is unremarkable.  Penile implant noted. Other: No abdominal wall hernia or abnormality. No abdominopelvic ascites. Musculoskeletal: No acute osseous abnormality. No aggressive osseous lesion. Small focal central disc osteophyte complex at L1-2. bilateral facet arthropathy at L4-5. IMPRESSION: 1. Left mid abdominal colostomy. No drainable fluid collection in the region of the colostomy to suggest an abscess. 2. Multiple distended fluid-filled loops of small bowel in the lower mid abdomen measuring up to 3 cm which may reflect an ileus versus partial small bowel obstruction. 3. 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 4. Aortic Atherosclerosis (ICD10-I70.0).  Electronically Signed   By: Kathreen Devoid M.D.   On: 02/18/2022 16:35    Procedures Procedures    Medications Ordered in ED Medications - No data to display  ED Course/ Medical Decision Making/ A&P Clinical Course as of 02/18/22 1613  Tue Feb 18, 2022  1613 Hand-assisted laparoscopic creation of a divided loop colostomy was performed by Dr. Marge Duncans on 12/03/2021. [MB]  Clinical Course User Index [MB] Hayden Rasmussen, MD                           Medical Decision Making This patient complains of lower abdominal pain nausea decreased ostomy output; this involves an extensive number of treatment Options and is a complaint that carries with it a high risk of complications and morbidity. The differential includes obstruction, adhesions, abscess, ileus, enteritis  I ordered, reviewed and interpreted labs, which included CBC with normal white count normal hemoglobin, chemistries normal other than elevated glucose, urinalysis without clear signs of infection  I ordered imaging studies which included CT abdomen and pelvis and I independently    visualized and interpreted imaging which showed no abscess identified.  Does have some dilated small bowel loops, ileus versus PSBO Previous records obtained and reviewed in epic including prior notes from [facility] and [facility].  Complex surgical history Cardiac monitoring reviewed, patient in normal sinus rhythm Social determinants considered, no significant barriers Critical Interventions: None  After the interventions stated above, I reevaluated the patient and found patient to be feeling better with stable vital signs. Admission and further testing considered, no indications for admission or further work-up at this time.  Recommended close follow-up with his treating providers.  Disc of CT images provided to patient at his request.  Also given contact information for general surgery locally as patient plans on following up locally.  Return  instructions discussed          Final Clinical Impression(s) / ED Diagnoses Final diagnoses:  Lower abdominal pain  Nausea    Rx / DC Orders ED Discharge Orders     None         Hayden Rasmussen, MD 02/19/22 1259

## 2022-02-18 NOTE — ED Provider Triage Note (Signed)
He statesEmergency Medicine Provider Triage Evaluation Note  Phillip Tate , a 66 y.o. male  was evaluated in triage.  Pt complains of abdominal pain.  Patient states that he had a colostomy put in about 2 weeks ago and had a history of colon cancer.  He states that about 3 weeks ago he started noticing some purulent drainage from the colostomy tube at around the stoma.  He had a CT scan done with a surgeon in [place] and did not find anything to explain the purulent drainage, however he was placed on antibiotics.  The purulent drainage has improved somewhat but is still present and he has finished his course of antibiotics.  Patient states that yesterday he started noticing some generalized abdominal pain and nausea.  He states that he did not have any stool come out of his colostomy bag for 2 days which is abnormal for him.  Since his surgeon is in [place], he advised him to come to the emergency department to have a repeat CT scan done.  Review of Systems  Positive:  Negative:   Physical Exam  BP 138/90 (BP Location: Left Arm)    Pulse 84    Temp 99 F (37.2 C) (Oral)    Resp 18    SpO2 96%  Gen:   Awake, no distress   Resp:  Normal effort  MSK:   Moves extremities without difficulty  Other:  Abdomen distended. Generalized abdominal tenderness noted. Colostomy bag empty with no obvious purulent drainage.   Medical Decision Making  Medically screening exam initiated at 1:51 PM.  Appropriate orders placed.  Phillip Tate was informed that the remainder of the evaluation will be completed by another provider, this initial triage assessment does not replace that evaluation, and the importance of remaining in the ED until their evaluation is complete.     Adolphus Birchwood, [place] 02/18/22 1355

## 2022-02-18 NOTE — ED Notes (Signed)
Pt reports puss around his stoma sight, abdominal pain and nausea. Pt was on keflex and flagyl for the same and just finished his abx yesterday. He still notes puss around site, called his GI doctor who instructed him to come to the ED.

## 2022-02-19 DIAGNOSIS — E291 Testicular hypofunction: Secondary | ICD-10-CM | POA: Diagnosis not present

## 2022-02-19 DIAGNOSIS — Z7984 Long term (current) use of oral hypoglycemic drugs: Secondary | ICD-10-CM | POA: Diagnosis not present

## 2022-02-19 DIAGNOSIS — E1122 Type 2 diabetes mellitus with diabetic chronic kidney disease: Secondary | ICD-10-CM | POA: Diagnosis not present

## 2022-02-25 DIAGNOSIS — E291 Testicular hypofunction: Secondary | ICD-10-CM | POA: Diagnosis not present

## 2022-02-25 DIAGNOSIS — E1122 Type 2 diabetes mellitus with diabetic chronic kidney disease: Secondary | ICD-10-CM | POA: Diagnosis not present

## 2022-02-25 DIAGNOSIS — Z794 Long term (current) use of insulin: Secondary | ICD-10-CM | POA: Diagnosis not present

## 2022-02-25 DIAGNOSIS — N181 Chronic kidney disease, stage 1: Secondary | ICD-10-CM | POA: Diagnosis not present

## 2022-02-26 NOTE — Progress Notes (Signed)
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 Marge Duncans on 12/03/2021. Subsequent pus from around stoma site after surgery, culture ecoli by PCP, given augmentin  CT 02/21 without abscess/intraAB infection, no fever, chills.  This has resolved but now has pus from rectum Will need culture of rectum with surgeon, follow up with surgeon Will get stool culture Will start on flagyl after completes augmentin Follow up with Dr. Tarri Glenn.   Partial small bowel obstruction Increase miralax to BID, follow up surgery ER precautions discussed.    History of Present Illness:  66 y.o. male  with a past medical history of stage IIIA rectal adenocarcinoma 2006 s/p neoadjuvant chemoradio therapy, s/p intersphincteric proctectomy with colonic J-pouch and inverting loop ileostomy 10/2005 s/p loop ileostomy takedown 05/2006 hemorrhoidectomy 10/2018,  repair of prolapsed J pouch 11/2018 and others listed below 09/07/2021 MRI pelvis 11/28/2020 Colonoscopy without neoplasms known to Dr. Tarri Glenn returns to clinic today for evaluation of mucus in his stool.   Patient was last seen in the office 09/24/2021 by Dr. Tarri Glenn for chronic rectal mucosa. Given Cipro 500 mg twice daily for 14 days for chronic pouchitis, added Lialda 2.4 g daily. Considered VSL 3 probiotics if no improvement.  Discussed FODMAP diet. Encourage follow-up with IBD clinic at [facility]. For patient's fecal incontinence pelvic floor was not helpful, anorectal manometry  showed very weak internal and external sphincter muscles, continued fiber, could not tolerate Colesytramine or colestipol.  Cystoscopy and placement of bilateral ureteral catheters was performed by Rufina Falco, MD on 12/03/2021. Hand-assisted laparoscopic creation of a divided loop colostomy was performed by Dr. Marge Duncans on 12/03/2021.  Patient has been having AB pain,  pus around stoma,, nausea and distention, went to the ER 02/12/2022 and 02/18/2022  02/12/2022 Ct AB and pelvis showed no obstruction, no penumoperitoneium. Fatty liver, Small radiolucencies in spinous process of multiple lumbar vertebrae suggestive of inflammatory or neoplastic process consider bone scan.  02/18/2022 CT showed distended fluid filled loops up to 3 cm reflecting ileus versus pSBO, focal area of inflammation left upper omentum reflecting subacute or chronic omental infract.   He states after the surgery he started to have pus from outer rim of stoma. Has had worsening pus around colostomy bag. Had culture from PCP that showed E coli, beta hemolytic strep group B, moderate growth, resistant to cipro/bactrim, sensitive to augmentin.  Has been on Augmentin x 02/21 has 4 days left, the pus from the colostomy has decreased. He has some mucus and pus from rectum.  Surgeon is requesting culture prior to seeing them.  He is on fiber, stool softener, miralax 17 grams once a day x 10 days, senna in the AM just added. Stool is softer but still hard.  No fever, chills this entire time.  No Ab pain.   Patient has appointment scheduled 03/18/2022 with Dr. Morton Stall general surgery. He had 02/18/22 CT due to decreased BM for 2 days, normally has 2-3 x a day and he was not passing gas, had nausea without vomiting. Was taking MOM.    Previous GI history:  CT Abdomen Pelvis W Contrast  Result Date: 02/18/2022  CLINICAL DATA:  Colostomy site is oozing poss. Evaluate for bowel obstruction. History of rectal cancer. EXAM: CT  ABDOMEN AND PELVIS WITH CONTRAST TECHNIQUE: Multidetector CT imaging of the abdomen and pelvis was performed using the standard protocol following bolus administration of intravenous contrast. RADIATION DOSE REDUCTION: This exam was performed according to the departmental dose-optimization program which includes automated exposure control, adjustment of the mA and/or kV according to patient size and/or use of iterative reconstruction technique. CONTRAST:  196mL OMNIPAQUE IOHEXOL 300 MG/ML  SOLN COMPARISON:  02/12/2022, 2010-07-21 FINDINGS: Lower chest: No acute abnormality. Hepatobiliary: No focal liver abnormality is seen. No gallstones, gallbladder wall thickening, or biliary dilatation. Pancreas: Unremarkable. No pancreatic ductal dilatation or surrounding inflammatory changes. Spleen: Normal in size without focal abnormality. Adrenals/Urinary Tract: Adrenal glands are unremarkable. Kidneys are normal, without renal calculi, focal lesion, or hydronephrosis. Bladder is unremarkable. Stomach/Bowel: Stomach is normal. Focal area of inflammatory changes in the left upper omentum similar to the prior examination of 02/12/2022 likely reflecting an omental infarct. Left mid abdominal colostomy. No drainable fluid collection in the region of the colostomy to suggest an abscess. Multiple distended fluid-filled loops of small bowel in the lower mid abdomen measuring up to 3 cm which may reflect an ileus versus partial small bowel obstruction. No pneumatosis, pneumoperitoneum or portal venous gas. Vascular/Lymphatic: Normal caliber abdominal aorta with mild atherosclerosis. No lymphadenopathy. Reproductive: Prostate is unremarkable.  Penile implant noted. Other: No abdominal wall hernia or abnormality. No abdominopelvic ascites. Musculoskeletal: No acute osseous abnormality. No aggressive osseous lesion. Small focal central disc osteophyte complex at L1-2. bilateral facet arthropathy at L4-5. IMPRESSION: 1. Left mid  abdominal colostomy. No drainable fluid collection in the region of the colostomy to suggest an abscess. 2. Multiple distended fluid-filled loops of small bowel in the lower mid abdomen measuring up to 3 cm which may reflect an ileus versus partial small bowel obstruction. 3. 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 4. Aortic Atherosclerosis (ICD10-I70.0). Electronically Signed   By: Kathreen Devoid M.D.   On: 02/18/2022 16:35   CT ABDOMEN PELVIS W CONTRAST  Result Date: 02/12/2022 CLINICAL DATA:  Abdominal pain, history of rectal carcinoma EXAM: CT ABDOMEN AND PELVIS WITH CONTRAST TECHNIQUE: Multidetector CT imaging of the abdomen and pelvis was performed using the standard protocol following bolus administration of intravenous contrast. RADIATION DOSE REDUCTION: This exam was performed according to the departmental dose-optimization program which includes automated exposure control, adjustment of the mA and/or kV according to patient size and/or use of iterative reconstruction technique. CONTRAST:  168mL ISOVUE-300 IOPAMIDOL (ISOVUE-300) INJECTION 61% COMPARISON:  Previous studies including the CT done on 2010/07/21 FINDINGS: Lower chest: Coronary artery calcifications are seen. Hepatobiliary: There is fatty infiltration in the liver. Gallbladder is unremarkable. Pancreas: No focal abnormality is seen. Spleen: Unremarkable. Adrenals/Urinary Tract: Adrenals are unremarkable. There are no renal ureteral stones no focal abnormality is seen in the urinary bladder. Stomach/Bowel: Stomach is not distended. Small bowel loops are not dilated. Appendix is difficult to visualize. As far as seen, there is no dilated appendix in the pericecal region. There is no pericecal inflammation. Colostomy is seen in the left mid abdomen. Surgical staples are seen in rectum. There is no significant wall thickening  in colon. Vascular/Lymphatic: There are scattered atherosclerotic plaques and calcifications in the aorta and its major branches. No new significant lymphadenopathy seen. Reproductive: Prostate is not enlarged. There  is a fluid containing structure along the right lateral margin of the urinary bladder, most likely device used for management of erectile dysfunction. Other: There is no ascites or pneumoperitoneum. Umbilical and paraumbilical hernias containing fat are noted. Musculoskeletal: Degenerative changes are noted in the lumbar spine at L1-L2 level with disc space narrowing and posterior bony spurs. There are small faint radiolucencies in the spinous processes of L1, L2, L3 and L5 vertebrae. Possibility of skeletal metastatic disease is not excluded. If clinically warranted, follow-up radionuclide bone scan or MRI may be considered. IMPRESSION: There is no evidence of intestinal obstruction or pneumoperitoneum. There is no hydronephrosis. There are few small radiolucencies in the spinous process of multiple lumbar vertebrae. This may suggest inflammatory or neoplastic process. Radionuclide bone scan as clinically warranted may be considered. Fatty liver.  Coronary artery disease. Other findings as described in the body of the report. Electronically Signed   By: Elmer Picker M.D.   On: 02/12/2022 16:20    Current Medications:   Current Outpatient Medications (Endocrine & Metabolic):    empagliflozin (JARDIANCE) 25 MG TABS tablet, Take 25 mg by mouth daily.   insulin glargine (LANTUS) 100 UNIT/ML injection, Inject 40-42 Units into the skin at bedtime.    insulin lispro (HUMALOG) 100 UNIT/ML injection, Inject 16-40 Units into the skin 3 (three) times daily before meals. Sliding scan  16units am, 25-30units lunch. 35-40units supper   INVOKANA 300 MG TABS tablet, Take 300 mg by mouth daily before breakfast.   metFORMIN (GLUCOPHAGE) 1000 MG tablet, Take 1,000 mg by mouth 2 (two) times daily with a  meal.   NOVOLOG FLEXPEN 100 UNIT/ML FlexPen, Inject 90 Units into the skin 3 (three) times daily with meals.    SYNJARDY XR 25-1000 MG TB24,   Current Outpatient Medications (Cardiovascular):    amLODipine (NORVASC) 10 MG tablet, Take 10 mg by mouth every morning.    colestipol (COLESTID) 1 g tablet, Take 1 tablet (1 g total) by mouth 2 (two) times daily.   labetalol (NORMODYNE) 300 MG tablet, Take 300 mg by mouth 2 (two) times daily.   lisinopril (PRINIVIL,ZESTRIL) 40 MG tablet, Take 40 mg by mouth every evening.    rosuvastatin (CRESTOR) 10 MG tablet, Take 1 tablet (10 mg total) by mouth daily.  Current Outpatient Medications (Respiratory):    ipratropium (ATROVENT) 0.06 % nasal spray, 2 sprays in each nostril  Current Outpatient Medications (Analgesics):    ibuprofen (ADVIL,MOTRIN) 200 MG tablet, Take 200 mg by mouth every 6 (six) hours as needed for fever or moderate pain.  Current Outpatient Medications (Hematological):    Ferrous Sulfate (SLOW FE) 142 (45 Fe) MG TBCR, 1 tablet  Current Outpatient Medications (Other):    AMBULATORY NON FORMULARY MEDICATION, Medication Name: Nitroglycerin 0.125% gel. Apply pea size amount to the rectum three times a day for 6-8 weeks   amoxicillin-clavulanate (AUGMENTIN) 875-125 MG tablet, Take 1 tablet by mouth 2 (two) times daily.   Cholecalciferol (VITAMIN D3) 50 MCG (2000 UT) capsule, 1 capsule   dicyclomine (BENTYL) 20 MG tablet, Take 20 mg by mouth every 6 (six) hours. As needed   fish oil-omega-3 fatty acids 1000 MG capsule, Take 1,500 mg by mouth daily.   fluocinonide cream (LIDEX) 0.05 %, See admin instructions.   gabapentin (NEURONTIN) 300 MG capsule, Take 300 mg by mouth 3 (three) times daily.    lactulose (CEPHULAC) 10 g packet, Take 10 g by mouth every other day.   mesalamine (LIALDA) 1.2 g EC tablet,  Take 2 tablets (2.4 g total) by mouth daily with breakfast.   Multiple Vitamin (MULTIVITAMIN) tablet, Take 1 tablet by mouth daily.    NOVOFINE 30G X 8 MM MISC, Inject 1 packet into the skin as needed. BLOOD SUGARS   pantoprazole (PROTONIX) 40 MG tablet, Take 1 tablet (40 mg total) by mouth 2 (two) times daily.   polyvinyl alcohol (LIQUIFILM TEARS) 1.4 % ophthalmic solution, Place 1 drop into both eyes as needed for dry eyes.   VALACYCLOVIR HCL PO, Take 1 mg by mouth daily.   zolpidem (AMBIEN) 10 MG tablet, Take 10 mg by mouth at bedtime.  Surgical History:  He  has a past surgical history that includes Penile prosthesis placement (10-29-2009); PORT A CATH PLACEMENT (07-23-2005); ANTERIOR COLON RESECTION W/ COLOSTOMY (11-04-2006); Colostomy takedown (june 2007); Colonoscopy with esophagogastroduodenoscopy (egd) (last one 03-07-2010); Cataract extraction w/ intraocular lens implant (Right, sept 2015  &  feb 2016); Examination under anesthesia (N/A, 04/23/2015); Colonoscopy with propofol (N/A, 10/23/2015); prolapsed hemorrhoid; and Anal Rectal manometry (N/A, 09/04/2021). Family History:  His family history includes Diabetes in his mother; Hypertension in his father. Social History:   reports that he has never smoked. He has never used smokeless tobacco. He reports current alcohol use. He reports that he does not use drugs.  Current Medications, Allergies, Past Medical History, Past Surgical History, Family History and Social History were reviewed in [employer] record.  Physical Exam: BP 110/70    Pulse 70    Ht 5\' 10"  (1.778 m)    Wt 217 lb 2 oz (98.5 kg)    SpO2 96%    BMI 31.15 kg/m  General:   Pleasant, well developed male in no acute distress Eyes: sclerae anicteric,conjunctive pink  Heart:  regular rate and rhythm Pulm: Clear anteriorly; no wheezing Abdomen:  Soft, Obese AB, skin exam scars- vertical well healed scars and ostomy present- LLQ, stoma not evaluate, has brown formed stool in bag, Normal bowel sounds. mild tenderness in the LLQ. Without guarding and Without rebound, without  hepatomegaly. Extremities:  Without edema. Peripheral pulses intact.  Neurologic:  Alert and  oriented x4;  grossly normal neurologically. Skin:   Dry and intact without significant lesions or rashes. Psychiatric: Demonstrates good judgement and reason without abnormal affect or behaviors.  Phillip Crofts, PA-C 02/27/22

## 2022-02-27 ENCOUNTER — Ambulatory Visit: Payer: Medicare HMO | Admitting: Physician Assistant

## 2022-02-27 ENCOUNTER — Encounter: Payer: Self-pay | Admitting: Physician Assistant

## 2022-02-27 VITALS — BP 110/70 | HR 70 | Ht 70.0 in | Wt 217.1 lb

## 2022-02-27 DIAGNOSIS — K91850 Pouchitis: Secondary | ICD-10-CM

## 2022-02-27 DIAGNOSIS — Z85048 Personal history of other malignant neoplasm of rectum, rectosigmoid junction, and anus: Secondary | ICD-10-CM | POA: Diagnosis not present

## 2022-02-27 DIAGNOSIS — K6289 Other specified diseases of anus and rectum: Secondary | ICD-10-CM | POA: Diagnosis not present

## 2022-02-27 MED ORDER — METRONIDAZOLE 500 MG PO TABS: 500.0000 mg | ORAL_TABLET | Freq: Two times a day (BID) | ORAL | 0 refills | Status: AC

## 2022-02-27 NOTE — Patient Instructions (Addendum)
Get back on lialda ?Increase miralax to twice a day ?Finish Augmentin ?Will get stool sample ?Will discuss with Dr. Tarri Glenn further ?Please follow up with surgeon.  ? ?Pending samples can consider antibiotics or further evaluation.  ? ?Your provider has requested that you go to the basement level for lab work before leaving today. Press "B" on the elevator. The lab is located at the first door on the left as you exit the elevator. ? ?Due to recent changes in healthcare laws, you may see the results of your imaging and laboratory studies on MyChart before your provider has had a chance to review them.  We understand that in some cases there may be results that are confusing or concerning to you. Not all laboratory results come back in the same time frame and the provider may be waiting for multiple results in order to interpret others.  Please give [place] 48 hours in order for your provider to thoroughly review all the results before contacting the office for clarification of your results.  ? ?I appreciate the opportunity to care for you. ?Vicie Mutters, PA-C ? ?

## 2022-02-28 ENCOUNTER — Other Ambulatory Visit: Payer: Medicare HMO

## 2022-02-28 DIAGNOSIS — K6289 Other specified diseases of anus and rectum: Secondary | ICD-10-CM | POA: Diagnosis not present

## 2022-02-28 NOTE — Progress Notes (Signed)
Reviewed and agree with management plans. ? ?Ameyah Bangura L. Dhruv Christina, MD, MPH  ?

## 2022-03-04 LAB — STOOL CULTURE: E coli, Shiga toxin Assay: NEGATIVE

## 2022-03-12 DIAGNOSIS — I10 Essential (primary) hypertension: Secondary | ICD-10-CM | POA: Diagnosis not present

## 2022-03-12 DIAGNOSIS — E1165 Type 2 diabetes mellitus with hyperglycemia: Secondary | ICD-10-CM | POA: Diagnosis not present

## 2022-03-12 DIAGNOSIS — E782 Mixed hyperlipidemia: Secondary | ICD-10-CM | POA: Diagnosis not present

## 2022-03-14 ENCOUNTER — Ambulatory Visit: Payer: Medicare HMO | Admitting: Gastroenterology

## 2022-03-18 DIAGNOSIS — Z85048 Personal history of other malignant neoplasm of rectum, rectosigmoid junction, and anus: Secondary | ICD-10-CM | POA: Diagnosis not present

## 2022-03-21 DIAGNOSIS — H8110 Benign paroxysmal vertigo, unspecified ear: Secondary | ICD-10-CM | POA: Diagnosis not present

## 2022-03-21 DIAGNOSIS — Z933 Colostomy status: Secondary | ICD-10-CM | POA: Diagnosis not present

## 2022-03-28 DIAGNOSIS — K623 Rectal prolapse: Secondary | ICD-10-CM | POA: Diagnosis not present

## 2022-03-28 DIAGNOSIS — K625 Hemorrhage of anus and rectum: Secondary | ICD-10-CM | POA: Diagnosis not present

## 2022-04-01 ENCOUNTER — Encounter: Payer: Self-pay | Admitting: Gastroenterology

## 2022-04-01 ENCOUNTER — Ambulatory Visit: Payer: Medicare HMO | Admitting: Gastroenterology

## 2022-04-01 VITALS — BP 138/66 | HR 47 | Ht 70.0 in | Wt 222.0 lb

## 2022-04-01 DIAGNOSIS — K91850 Pouchitis: Secondary | ICD-10-CM | POA: Diagnosis not present

## 2022-04-01 DIAGNOSIS — K6289 Other specified diseases of anus and rectum: Secondary | ICD-10-CM | POA: Diagnosis not present

## 2022-04-01 DIAGNOSIS — R159 Full incontinence of feces: Secondary | ICD-10-CM | POA: Diagnosis not present

## 2022-04-01 NOTE — Patient Instructions (Signed)
If you are age 67 or older, your body mass index should be between 23-30. Your Body mass index is 31.85 kg/m?Marland Kitchen If this is out of the aforementioned range listed, please consider follow up with your Primary Care Provider. ? ?If you are age 16 or younger, your body mass index should be between 19-25. Your Body mass index is 31.85 kg/m?Marland Kitchen If this is out of the aformentioned range listed, please consider follow up with your Primary Care Provider.  ? ?________________________________________________________ ? ?The [facility] providers would like to encourage you to use [facility] to communicate with providers for non-urgent requests or questions.  Due to long hold times on the telephone, sending your provider a message by [facility] may be a faster and more efficient way to get a response.  Please allow 48 business hours for a response.  Please remember that this is for non-urgent requests.  ?_______________________________________________________ ? ?It was a pleasure to see you today! ? ?Thank you for trusting me with your gastrointestinal care!   ? ? ?

## 2022-04-01 NOTE — Progress Notes (Signed)
? ?Referring Provider: Wenda Low, MD ?Primary Care Physician:  Wenda Low, MD ? ?Chief complaint:  Rectal pain and bleeding ? ? ?IMPRESSION:  ?Chronic rectal mucous with intermittent streaking  ?   - previously attributed to internal hemorrhoids and prolapsed J pouch ?Fecal incontinence with very weak internal and external anal sphincters ?History of colon polyps  ?   - tubular adenoma on colonoscopy 2016 ?   - 2 tubular adenomas on colonoscopy 2021 ?   - surveillance recommended in 5 years given the history of rectal cancer ?History of rectal adenocarcinoma s/p chemoradiation and J pouch 2006 ?History of partial SBO ?Microcytosis with normal hemoglobin ?   - iron 59, % sat 13 ? 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 Add Lialda with discussion today about the indications and use. May increase the dose if this is not adequate. Avoid NSAIDs. Consider VSL3 probiotics if symptoms not improving, as well as lowFODMAP diet. Encourage follow-up at [facility] in the IBD clinic when he follows up with his surgeons.   ? 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  Anorectal manometry ordered by Dr. Lysle Rubens shows very weak internal and external anal sphincters. Continue with stool bulking agent. He has not tolerated cholestyramine or colestipol.  ? ?PLAN: ?- Avoid NSAIDs ?- Consider low FOMAP diet ?- Obtain records from Dr. Olena Leatherwood MD in [place], [place]  ?- Follow-up in 6-8 weeks, earlier if needed ? ? ?HPI: Phillip Tate is a 66 y.o. male who returns in follow-up. ?  ?He was diagnosed with stage IIIA rectal adenocarcinoma 2006 s/p neoadjuvant chemoradio therapy, s/p intersphincteric proctectomy with colonic J-pouch and inverting loop ileostomy 10/2005, s/p loop ileostomy takedown 05/2006, hemorrhoidectomy 10/2018, and  repair of prolapsed J pouch 11/2018. Has been seen by Dr. Hester Mates at [facility] multiple times for mucous stools with blood streaking and leakage. His notes: "I think this is a functional issue with his J pouch. I have nothing to offer from a surgical standpoint." ? ?At the time of his initial consultation 11/27/20 he reported fecal soiling that occurs with walking. Wears adult pull-ups. Started cholestyramine powder 2 weeks ago (insurance did not cover colestipol), as well as dicyclomine 20 mg PRN, pantroprazole 20 mg, lactulose.  Did not find the cholestryamine helpful. ? ?Colonoscopy 11/28/20 showed decreased sphincter tone, anastomosis, and 4 small polyps, two of which were tubular adenomas and two were lymphoid aggregates.  ? ?Pelvic PT with Earlie Counts x 2 did not help.  ? ?Anorectal manometry 09/04/21 show very weak internal and external anal sphincters. ? ?Has had chronic pouchitis with rectal mucous requiring intermittent antibiotics.  Colestipol did not help. Dicyclomine PRN for some relief of the liquid stools but it has been inconsistent and he believes that it caused problems with his sinuses.  He is also using Imodium PRN. Trial of Lialda provided no relief.  ? ?Seen by a rectal surgeon in [place]. Awaiting surgery later this month. I have yet to receive records from the surgeon in [place].  ? ? ?Endoscopic history:  ?- Prior endoscopy in 2011 with Dr. Earle Gell with normal small bowel biopsies. Tubular adenoma with Dr. Colonoscopy 2016.  ?- Dr. Clinton Gallant with colonoscopy 2019 with a 47m sigmoid polyp removed from the sigmoid. Colonic mucosa appeared normal.  Surveillance recommended in 5 years.  ?- Colonoscopy 11/28/20 showed decreased sphincter tone, anastomosis, and 4  small polyps, two of which were tubular adenomas and two were lymphoid aggregates.  ? ?Anorectal manometry 09/04/21 show very weak internal and external anal sphincters. ? ? ?Past Medical History:  ?Diagnosis Date  ? Coronary artery disease    ? DDD (degenerative disc disease), lumbar   ? Diabetes mellitus without complication ([place])   ? Dyslipidemia   ? History of rectal cancer   ? dx 2006 --  Stage III, T3  N1  M0,  s/p  chemoradiation (07-10-2005 to 09-05-2005) and anterior colon resection 11-04-2006  ? History of shingles   ? Hyperlipidemia   ? Hypertension   ? Iron deficiency anemia   ? intolerent PO iron--  gets IV iron,  last one 03-12-2015 ([place])  ? Organic impotence   ? Rectal cancer ([place])   ? adenocarcinoma stage lll  ? ? ?Past Surgical History:  ?Procedure Laterality Date  ? ANAL RECTAL MANOMETRY N/A 09/04/2021  ? Procedure: ANO RECTAL MANOMETRY;  Surgeon: Mauri Pole, MD;  Location: [facility];  Service: Endoscopy;  Laterality: N/A;  ? ANTERIOR COLON RESECTION W/ COLOSTOMY  11-04-2006  ? CATARACT EXTRACTION W/ INTRAOCULAR LENS IMPLANT Right sept 2015  &  feb 2016  ? COLONOSCOPY WITH ESOPHAGOGASTRODUODENOSCOPY (EGD)  last one 03-07-2010  ? COLONOSCOPY WITH PROPOFOL N/A 10/23/2015  ? Procedure: COLONOSCOPY WITH PROPOFOL;  Surgeon: Garlan Fair, MD;  Location: [facility];  Service: Endoscopy;  Laterality: N/A;  ? COLOSTOMY TAKEDOWN  june 2007  ? EXAMINATION UNDER ANESTHESIA N/A 04/23/2015  ? Procedure: EXAM UNDER ANESTHESIA, MANIPULATION OF PENILE PROSTHESIS PUMP;  Surgeon: Kathie Rhodes, MD;  Location: [facility];  Service: Urology;  Laterality: N/A;  ? PENILE PROSTHESIS PLACEMENT  10-29-2009  ? PORT A CATH PLACEMENT  07-23-2005  ? prolapsed hemorrhoid    ? ? ?Current Outpatient Medications  ?Medication Sig Dispense Refill  ? AMBULATORY NON FORMULARY MEDICATION Medication Name: Nitroglycerin 0.125% gel. Apply pea size amount to the rectum three times a day for 6-8 weeks 30 g 0  ? amLODipine (NORVASC) 10 MG tablet Take 10 mg by mouth every morning.     ? amoxicillin-clavulanate (AUGMENTIN) 875-125 MG tablet Take 1 tablet by mouth 2 (two) times daily.    ? Cholecalciferol (VITAMIN D3) 50 MCG (2000 UT) capsule 1 capsule     ? colestipol (COLESTID) 1 g tablet Take 1 tablet (1 g total) by mouth 2 (two) times daily. 180 tablet 3  ? dicyclomine (BENTYL) 20 MG tablet Take 20 mg by mouth every 6 (six) hours. As needed    ? empagliflozin (JARDIANCE) 25 MG TABS tablet Take 25 mg by mouth daily.    ? Ferrous Sulfate (SLOW FE) 142 (45 Fe) MG TBCR 1 tablet    ? fish oil-omega-3 fatty acids 1000 MG capsule Take 1,500 mg by mouth daily.    ? fluocinonide cream (LIDEX) 0.05 % See admin instructions.    ? gabapentin (NEURONTIN) 300 MG capsule Take 300 mg by mouth 3 (three) times daily.   0  ? ibuprofen (ADVIL,MOTRIN) 200 MG tablet Take 200 mg by mouth every 6 (six) hours as needed for fever or moderate pain.    ? insulin glargine (LANTUS) 100 UNIT/ML injection Inject 40-42 Units into the skin at bedtime.     ? insulin lispro (HUMALOG) 100 UNIT/ML injection Inject 16-40 Units into the skin 3 (three) times daily before meals. Sliding scan  16units am, 25-30units lunch. 35-40units supper    ? INVOKANA 300 MG TABS  tablet Take 300 mg by mouth daily before breakfast.  2  ? ipratropium (ATROVENT) 0.06 % nasal spray 2 sprays in each nostril    ? labetalol (NORMODYNE) 300 MG tablet Take 300 mg by mouth 2 (two) times daily.    ? lactulose (CEPHULAC) 10 g packet Take 10 g by mouth every other day.    ? lisinopril (PRINIVIL,ZESTRIL) 40 MG tablet Take 40 mg by mouth every evening.     ? mesalamine (LIALDA) 1.2 g EC tablet Take 2 tablets (2.4 g total) by mouth daily with breakfast. 60 tablet 1  ? metFORMIN (GLUCOPHAGE) 1000 MG tablet Take 1,000 mg by mouth 2 (two) times daily with a meal.    ? Multiple Vitamin (MULTIVITAMIN) tablet Take 1 tablet by mouth daily.    ? NOVOFINE 30G X 8 MM MISC Inject 1 packet into the skin as needed. BLOOD SUGARS  2  ? NOVOLOG FLEXPEN 100 UNIT/ML FlexPen Inject 90 Units into the skin 3 (three) times daily with meals.   2  ? pantoprazole (PROTONIX) 40 MG tablet Take 1 tablet (40 mg total) by mouth 2 (two) times daily. 180 tablet 3   ? polyvinyl alcohol (LIQUIFILM TEARS) 1.4 % ophthalmic solution Place 1 drop into both eyes as needed for dry eyes.    ? rosuvastatin (CRESTOR) 10 MG tablet Take 1 tablet (10 mg total) by mouth daily.

## 2022-04-03 NOTE — Unmapped (Signed)
Formatting of this note might be different from the original.  Preop phone call completed.  Preop instructions and preop medications reveiwed with patient.  Pt instructed to Purchase 2  4 oz bottles of CHG soap and instructed in use.      [facility]ERR[facility] FOR FAMILY  Electronically signed by Thayer Jew, RN at 04/03/2022 10:23 AM EDT

## 2022-04-03 NOTE — Interval H&P Note (Signed)
Preop phone call completed.  Preop instructions and preop medications reveiwed with patient.  Pt instructed to Purchase 2  4 oz bottles of CHG soap and instructed in use.      [facility] REFERRAL MADE FOR FAMILY

## 2022-04-07 ENCOUNTER — Encounter: Payer: Self-pay | Admitting: Gastroenterology

## 2022-04-08 ENCOUNTER — Inpatient Hospital Stay: Payer: MEDICARE

## 2022-04-08 DIAGNOSIS — I129 Hypertensive chronic kidney disease with stage 1 through stage 4 chronic kidney disease, or unspecified chronic kidney disease: Secondary | ICD-10-CM | POA: Diagnosis not present

## 2022-04-08 DIAGNOSIS — K633 Ulcer of intestine: Secondary | ICD-10-CM | POA: Diagnosis not present

## 2022-04-08 DIAGNOSIS — E785 Hyperlipidemia, unspecified: Secondary | ICD-10-CM | POA: Diagnosis not present

## 2022-04-08 DIAGNOSIS — N181 Chronic kidney disease, stage 1: Secondary | ICD-10-CM | POA: Diagnosis not present

## 2022-04-08 DIAGNOSIS — E1122 Type 2 diabetes mellitus with diabetic chronic kidney disease: Secondary | ICD-10-CM | POA: Diagnosis not present

## 2022-04-08 DIAGNOSIS — K55049 Acute infarction of large intestine, extent unspecified: Secondary | ICD-10-CM | POA: Diagnosis not present

## 2022-04-08 DIAGNOSIS — K623 Rectal prolapse: Secondary | ICD-10-CM | POA: Diagnosis not present

## 2022-04-08 DIAGNOSIS — Z87891 Personal history of nicotine dependence: Secondary | ICD-10-CM | POA: Diagnosis not present

## 2022-04-08 DIAGNOSIS — K6389 Other specified diseases of intestine: Secondary | ICD-10-CM | POA: Diagnosis not present

## 2022-04-08 DIAGNOSIS — Z7984 Long term (current) use of oral hypoglycemic drugs: Secondary | ICD-10-CM | POA: Diagnosis not present

## 2022-04-08 DIAGNOSIS — E119 Type 2 diabetes mellitus without complications: Secondary | ICD-10-CM | POA: Diagnosis not present

## 2022-04-08 DIAGNOSIS — I10 Essential (primary) hypertension: Secondary | ICD-10-CM | POA: Diagnosis not present

## 2022-04-08 DIAGNOSIS — K219 Gastro-esophageal reflux disease without esophagitis: Secondary | ICD-10-CM | POA: Diagnosis not present

## 2022-04-08 DIAGNOSIS — K625 Hemorrhage of anus and rectum: Secondary | ICD-10-CM | POA: Diagnosis not present

## 2022-04-08 DIAGNOSIS — Z794 Long term (current) use of insulin: Secondary | ICD-10-CM | POA: Diagnosis not present

## 2022-04-08 LAB — GLUCOSE, POC
Glucose (POC): 95 mg/dL (ref 65–117)
Glucose (POC): 138 mg/dL — ABNORMAL HIGH (ref 65–117)
Glucose (POC): 56 mg/dL — ABNORMAL LOW (ref 65–117)
Glucose (POC): 120 mg/dL — ABNORMAL HIGH (ref 65–117)

## 2022-04-08 LAB — POCT GLUCOSE
POC Glucose: 95 mg/dL (ref 65–117)
POC Glucose: 138 mg/dL — ABNORMAL HIGH (ref 65–117)
POC Glucose: 56 mg/dL — ABNORMAL LOW (ref 65–117)
POC Glucose: 120 mg/dL — ABNORMAL HIGH (ref 65–117)

## 2022-04-08 MED ORDER — DEXTROSE 10% BOLUS IV (NEONATE)
10 % | INTRAVENOUS | Status: AC | PRN
Start: 2022-04-08 — End: 2022-04-09

## 2022-04-08 MED ORDER — ACETAMINOPHEN 500 MG TAB
500 mg | Freq: Four times a day (QID) | ORAL | Status: DC
Start: 2022-04-08 — End: 2022-04-09
  Administered 2022-04-08 – 2022-04-09 (×4): via ORAL

## 2022-04-08 MED ORDER — EPINEPHRINE (PF) 1 MG/ML INJECTION
1 mg/mL ( mL) | INTRAMUSCULAR | Status: AC
Start: 2022-04-08 — End: ?

## 2022-04-08 MED ORDER — ONDANSETRON (PF) 4 MG/2 ML INJECTION
42 mg/2 mL | Freq: Four times a day (QID) | INTRAMUSCULAR | Status: DC | PRN
Start: 2022-04-08 — End: 2022-04-09

## 2022-04-08 MED ORDER — ONDANSETRON (PF) 4 MG/2 ML INJECTION
4 mg/2 mL | INTRAMUSCULAR | Status: DC | PRN
Start: 2022-04-08 — End: 2022-04-08
  Administered 2022-04-08: 20:00:00 via INTRAVENOUS

## 2022-04-08 MED ORDER — BUPIVACAINE-EPINEPHRINE (PF) 0.25 %-1:200,000 IJ SOLN
0.25 %-1:200,000 | Freq: Once | INTRAMUSCULAR | Status: AC
Start: 2022-04-08 — End: 2022-04-08
  Administered 2022-04-08: 20:00:00 via SUBCUTANEOUS

## 2022-04-08 MED ORDER — EMPAGLIFLOZIN 25 MG TABLET
25 mg | Freq: Every day | ORAL | Status: DC
Start: 2022-04-08 — End: 2022-04-08

## 2022-04-08 MED ORDER — MIDAZOLAM 1 MG/ML IJ SOLN
1 mg/mL | INTRAMUSCULAR | Status: DC | PRN
Start: 2022-04-08 — End: 2022-04-08

## 2022-04-08 MED ORDER — SODIUM CHLORIDE 0.9 % INJECTION
INTRAMUSCULAR | Status: AC
Start: 2022-04-08 — End: ?

## 2022-04-08 MED ORDER — SODIUM CHLORIDE 0.9 % IJ SYRG
Freq: Three times a day (TID) | INTRAMUSCULAR | Status: DC
Start: 2022-04-08 — End: 2022-04-08

## 2022-04-08 MED ORDER — FENTANYL CITRATE (PF) 50 MCG/ML IJ SOLN
50 mcg/mL | INTRAMUSCULAR | Status: AC
Start: 2022-04-08 — End: ?

## 2022-04-08 MED ORDER — .PHARMACY TO SUBSTITUTE PER PROTOCOL
Status: DC | PRN
Start: 2022-04-08 — End: 2022-04-08

## 2022-04-08 MED ORDER — .PHARMACY TO SUBSTITUTE PER PROTOCOL
Status: DC | PRN
Start: 2022-04-08 — End: 2022-04-09

## 2022-04-08 MED ORDER — ROPIVACAINE (PF) 5 MG/ML (0.5 %) INJECTION
5 mg/mL (0. %) | INTRAMUSCULAR | Status: DC | PRN
Start: 2022-04-08 — End: 2022-04-08

## 2022-04-08 MED ORDER — INSULIN LISPRO 100 UNIT/ML INJECTION
100 unit/mL | Freq: Four times a day (QID) | SUBCUTANEOUS | Status: AC
Start: 2022-04-08 — End: 2022-04-09
  Administered 2022-04-09 (×2): via SUBCUTANEOUS

## 2022-04-08 MED ORDER — MECLIZINE 12.5 MG TAB
12.5 mg | Freq: Four times a day (QID) | ORAL | Status: AC | PRN
Start: 2022-04-08 — End: 2022-04-09

## 2022-04-08 MED ORDER — DIPHENHYDRAMINE HCL 50 MG/ML IJ SOLN
50 mg/mL | INTRAMUSCULAR | Status: DC | PRN
Start: 2022-04-08 — End: 2022-04-08

## 2022-04-08 MED ORDER — CEFOXITIN 2 GRAM IV SOLUTION
2 gram | Freq: Once | INTRAVENOUS | Status: AC
Start: 2022-04-08 — End: 2022-04-08
  Administered 2022-04-08: 18:00:00 via INTRAVENOUS

## 2022-04-08 MED ORDER — IPRATROPIUM BROMIDE 0.06 % NASAL SPRAY AEROSOL
420.06 mcg (0.06 %) | Freq: Two times a day (BID) | NASAL | Status: DC
Start: 2022-04-08 — End: 2022-04-09
  Administered 2022-04-09: 02:00:00 via NASAL

## 2022-04-08 MED ORDER — ONDANSETRON (PF) 4 MG/2 ML INJECTION
4 mg/2 mL | INTRAMUSCULAR | Status: DC | PRN
Start: 2022-04-08 — End: 2022-04-08

## 2022-04-08 MED ORDER — INSULIN GLARGINE 100 UNIT/ML INJECTION
100 unit/mL | Freq: Every evening | SUBCUTANEOUS | Status: DC
Start: 2022-04-08 — End: 2022-04-09
  Administered 2022-04-09: 02:00:00 via SUBCUTANEOUS

## 2022-04-08 MED ORDER — GLUCAGON 1 MG INJECTION
1 mg | INTRAMUSCULAR | Status: DC | PRN
Start: 2022-04-08 — End: 2022-04-09

## 2022-04-08 MED ORDER — SODIUM CHLORIDE 0.9 % IJ SYRG
INTRAMUSCULAR | Status: DC | PRN
Start: 2022-04-08 — End: 2022-04-08

## 2022-04-08 MED ORDER — FENTANYL CITRATE (PF) 50 MCG/ML IJ SOLN
50 mcg/mL | INTRAMUSCULAR | Status: AC | PRN
Start: 2022-04-08 — End: 2022-04-08
  Administered 2022-04-08 (×4): via INTRAVENOUS

## 2022-04-08 MED ORDER — GLUCOSE 4 GRAM CHEWABLE TAB
4 gram | ORAL | Status: AC | PRN
Start: 2022-04-08 — End: 2022-04-09

## 2022-04-08 MED ORDER — OXYCODONE 5 MG TAB
5 mg | ORAL | Status: AC | PRN
Start: 2022-04-08 — End: 2022-04-09
  Administered 2022-04-09 (×4): via ORAL

## 2022-04-08 MED ORDER — MORPHINE 2 MG/ML INJECTION
2 mg/mL | INTRAMUSCULAR | Status: DC | PRN
Start: 2022-04-08 — End: 2022-04-08

## 2022-04-08 MED ORDER — LIDOCAINE (PF) 10 MG/ML (1 %) IJ SOLN
10 mg/mL (1 %) | INTRAMUSCULAR | Status: DC | PRN
Start: 2022-04-08 — End: 2022-04-08

## 2022-04-08 MED ORDER — LACTATED RINGERS IV
INTRAVENOUS | Status: DC
Start: 2022-04-08 — End: 2022-04-08
  Administered 2022-04-08: 16:00:00 via INTRAVENOUS

## 2022-04-08 MED ORDER — OXYCODONE 5 MG TAB
5 mg | ORAL | Status: DC | PRN
Start: 2022-04-08 — End: 2022-04-08

## 2022-04-08 MED ORDER — ONDANSETRON 4 MG TAB, RAPID DISSOLVE
4 mg | Freq: Three times a day (TID) | ORAL | Status: AC | PRN
Start: 2022-04-08 — End: 2022-04-09
  Administered 2022-04-09: 07:00:00 via ORAL

## 2022-04-08 MED ORDER — MEPERIDINE (PF) 25 MG/ML INJ SOLUTION
25 mg/ml | Freq: Once | INTRAMUSCULAR | Status: DC
Start: 2022-04-08 — End: 2022-04-08

## 2022-04-08 MED ORDER — METFORMIN 850 MG TAB
850 mg | Freq: Two times a day (BID) | ORAL | Status: DC
Start: 2022-04-08 — End: 2022-04-09

## 2022-04-08 MED ORDER — PROPOFOL 10 MG/ML IV EMUL
10 mg/mL | INTRAVENOUS | Status: DC | PRN
Start: 2022-04-08 — End: 2022-04-08
  Administered 2022-04-08: 18:00:00 via INTRAVENOUS

## 2022-04-08 MED ORDER — FENTANYL CITRATE (PF) 50 MCG/ML IJ SOLN
50 mcg/mL | INTRAMUSCULAR | Status: DC | PRN
Start: 2022-04-08 — End: 2022-04-08
  Administered 2022-04-08: 18:00:00 via INTRAVENOUS

## 2022-04-08 MED ORDER — LACTATED RINGERS IV
INTRAVENOUS | Status: DC
Start: 2022-04-08 — End: 2022-04-08

## 2022-04-08 MED ORDER — ACETAMINOPHEN 325 MG TABLET
325 mg | Freq: Once | ORAL | Status: AC
Start: 2022-04-08 — End: 2022-04-08
  Administered 2022-04-08: 16:00:00 via ORAL

## 2022-04-08 MED ORDER — SODIUM CHLORIDE 0.9 % IV
11 mg/mL ( mL) | INTRAVENOUS | Status: DC | PRN
Start: 2022-04-08 — End: 2022-04-08
  Administered 2022-04-08: 19:00:00 via SUBCUTANEOUS

## 2022-04-08 MED ORDER — NEOSTIGMINE METHYLSULFATE 3 MG/3 ML (1 MG/ML) IV SYRINGE
3 mg/ mL (1 mg/mL) | INTRAVENOUS | Status: AC
Start: 2022-04-08 — End: ?

## 2022-04-08 MED ORDER — GLYCOPYRROLATE(PF) 0.4 MG/2 ML (0.2 MG/ML) IN STERILE WATER IV SYRINGE
0.4 mg/2 mL (0.2 mg/mL) | INTRAVENOUS | Status: AC
Start: 2022-04-08 — End: ?

## 2022-04-08 MED ORDER — ZOLPIDEM 5 MG TAB
5 mg | Freq: Every evening | ORAL | Status: DC | PRN
Start: 2022-04-08 — End: 2022-04-09

## 2022-04-08 MED ORDER — DEXAMETHASONE SODIUM PHOSPHATE 4 MG/ML IJ SOLN
4 mg/mL | INTRAMUSCULAR | Status: DC | PRN
Start: 2022-04-08 — End: 2022-04-08
  Administered 2022-04-08: 18:00:00 via INTRAVENOUS

## 2022-04-08 MED ORDER — SODIUM CHLORIDE 0.9 % IJ SYRG
INTRAMUSCULAR | Status: DC | PRN
Start: 2022-04-08 — End: 2022-04-09

## 2022-04-08 MED ORDER — VALACYCLOVIR 500 MG TAB
500 mg | Freq: Every day | ORAL | Status: DC
Start: 2022-04-08 — End: 2022-04-09

## 2022-04-08 MED ORDER — DEXTROSE 5%-LACTATED RINGERS IV
INTRAVENOUS | Status: DC
Start: 2022-04-08 — End: 2022-04-08
  Administered 2022-04-08: 18:00:00 via INTRAVENOUS

## 2022-04-08 MED ORDER — AMLODIPINE 5 MG TAB
5 mg | Freq: Every evening | ORAL | Status: AC
Start: 2022-04-08 — End: 2022-04-09
  Administered 2022-04-08: 23:00:00 via ORAL

## 2022-04-08 MED ORDER — EMPAGLIFLOZIN 10 MG TABLET
10 mg | Freq: Every day | ORAL | Status: AC
Start: 2022-04-08 — End: 2022-04-09
  Administered 2022-04-09: 12:00:00 via ORAL

## 2022-04-08 MED ORDER — PANTOPRAZOLE 40 MG TAB, DELAYED RELEASE
40 mg | Freq: Every day | ORAL | Status: AC
Start: 2022-04-08 — End: 2022-04-09
  Administered 2022-04-09: 12:00:00 via ORAL

## 2022-04-08 MED ORDER — GABAPENTIN 300 MG CAP
300 mg | Freq: Two times a day (BID) | ORAL | Status: DC
Start: 2022-04-08 — End: 2022-04-09
  Administered 2022-04-08 – 2022-04-09 (×2): via ORAL

## 2022-04-08 MED ORDER — FENTANYL CITRATE (PF) 50 MCG/ML IJ SOLN
50 mcg/mL | INTRAMUSCULAR | Status: DC | PRN
Start: 2022-04-08 — End: 2022-04-08

## 2022-04-08 MED ORDER — LISINOPRIL 20 MG TAB
20 mg | Freq: Every day | ORAL | Status: AC
Start: 2022-04-08 — End: 2022-04-09
  Administered 2022-04-09: 12:00:00 via ORAL

## 2022-04-08 MED ORDER — SUCCINYLCHOLINE CHLORIDE 20 MG/ML INJECTION
20 mg/mL | INTRAMUSCULAR | Status: DC | PRN
Start: 2022-04-08 — End: 2022-04-08
  Administered 2022-04-08: 18:00:00 via INTRAVENOUS

## 2022-04-08 MED ORDER — ROCURONIUM 10 MG/ML IV
10 mg/mL | INTRAVENOUS | Status: DC | PRN
Start: 2022-04-08 — End: 2022-04-08
  Administered 2022-04-08 (×2): via INTRAVENOUS

## 2022-04-08 MED ORDER — DEXMEDETOMIDINE 100 MCG/ML IV SOLN
100 mcg/mL | INTRAVENOUS | Status: DC | PRN
Start: 2022-04-08 — End: 2022-04-08
  Administered 2022-04-08 (×2): via INTRAVENOUS

## 2022-04-08 MED ORDER — LIDOCAINE (PF) 20 MG/ML (2 %) IJ SOLN
20 mg/mL (2 %) | INTRAMUSCULAR | Status: DC | PRN
Start: 2022-04-08 — End: 2022-04-08
  Administered 2022-04-08: 18:00:00 via INTRAVENOUS

## 2022-04-08 MED ORDER — MIDAZOLAM 1 MG/ML IJ SOLN
1 mg/mL | INTRAMUSCULAR | Status: DC | PRN
Start: 2022-04-08 — End: 2022-04-08
  Administered 2022-04-08: 18:00:00 via INTRAVENOUS

## 2022-04-08 MED ORDER — LABETALOL 100 MG TAB
100 mg | Freq: Two times a day (BID) | ORAL | Status: AC
Start: 2022-04-08 — End: 2022-04-09
  Administered 2022-04-08 – 2022-04-09 (×2): via ORAL

## 2022-04-08 MED ORDER — SODIUM CHLORIDE 0.9 % IJ SYRG
Freq: Three times a day (TID) | INTRAMUSCULAR | Status: DC
Start: 2022-04-08 — End: 2022-04-09
  Administered 2022-04-09 (×2): via INTRAVENOUS

## 2022-04-08 MED ORDER — LACTULOSE 10 GRAM/15 ML ORAL SOLN
1015 gram/15 mL | Freq: Every day | ORAL | Status: DC | PRN
Start: 2022-04-08 — End: 2022-04-09

## 2022-04-08 MED ORDER — GELATIN ADSORBABLE 12 MM-7 MM TOPICAL SPONGE
12-7 mm | CUTANEOUS | Status: DC | PRN
Start: 2022-04-08 — End: 2022-04-08

## 2022-04-08 MED ORDER — MIDAZOLAM 1 MG/ML IJ SOLN
1 mg/mL | INTRAMUSCULAR | Status: AC
Start: 2022-04-08 — End: ?

## 2022-04-08 MED FILL — MIDAZOLAM 1 MG/ML IJ SOLN: 1 mg/mL | INTRAMUSCULAR | Qty: 2

## 2022-04-08 MED FILL — NORMAL SALINE FLUSH 0.9 % INJECTION SYRINGE: INTRAMUSCULAR | Qty: 40

## 2022-04-08 MED FILL — FENTANYL CITRATE (PF) 50 MCG/ML IJ SOLN: 50 mcg/mL | INTRAMUSCULAR | Qty: 2

## 2022-04-08 MED FILL — DEXTROSE 5%-LACTATED RINGERS IV: INTRAVENOUS | Qty: 1000

## 2022-04-08 MED FILL — LACTATED RINGERS IV: INTRAVENOUS | Qty: 1000

## 2022-04-08 MED FILL — AMLODIPINE 5 MG TAB: 5 mg | ORAL | Qty: 2

## 2022-04-08 MED FILL — IPRATROPIUM BROMIDE 0.06 % NASAL SPRAY AEROSOL: 42 mcg (0.06 %) | NASAL | Qty: 15

## 2022-04-08 MED FILL — EPINEPHRINE (PF) 1 MG/ML INJECTION: 1 mg/mL ( mL) | INTRAMUSCULAR | Qty: 2

## 2022-04-08 MED FILL — SODIUM CHLORIDE 0.9 % INJECTION: INTRAMUSCULAR | Qty: 20

## 2022-04-08 MED FILL — GLYCOPYRROLATE(PF) 0.4 MG/2 ML (0.2 MG/ML) IN STERILE WATER IV SYRINGE: 0.4 mg/2 mL (0.2 mg/mL) | INTRAVENOUS | Qty: 2

## 2022-04-08 MED FILL — GABAPENTIN 300 MG CAP: 300 mg | ORAL | Qty: 1

## 2022-04-08 MED FILL — ACETAMINOPHEN 500 MG TAB: 500 mg | ORAL | Qty: 2

## 2022-04-08 MED FILL — METFORMIN 850 MG TAB: 850 mg | ORAL | Qty: 1

## 2022-04-08 MED FILL — NEOSTIGMINE METHYLSULFATE 3 MG/3 ML (1 MG/ML) IV SYRINGE: 3 mg/ mL (1 mg/mL) | INTRAVENOUS | Qty: 3

## 2022-04-08 MED FILL — LACTULOSE 10 GRAM/15 ML ORAL SOLN: 10 gram/15 mL | ORAL | Qty: 15

## 2022-04-08 MED FILL — LABETALOL 100 MG TAB: 100 mg | ORAL | Qty: 3

## 2022-04-08 MED FILL — CEFOXITIN 2 GRAM IV SOLUTION: 2 gram | INTRAVENOUS | Qty: 2

## 2022-04-08 MED FILL — ACETAMINOPHEN 325 MG TABLET: 325 mg | ORAL | Qty: 2

## 2022-04-08 MED FILL — GELFOAM SPONGE SIZE 12-7MM 12 MM-7 MM: 12-7 mm | CUTANEOUS | Qty: 3

## 2022-04-08 NOTE — Progress Notes (Signed)
Formatting of this note might be different from the original.  Patient arrived to unit. Patient is alert and resting comfortably. Vital signs completed. Call bell within reach. All immediate needs addressed.  Electronically signed by Lenore Manner, RN at 04/08/2022  7:41 PM EDT

## 2022-04-08 NOTE — Op Note (Signed)
To be dictated.

## 2022-04-08 NOTE — Unmapped (Signed)
Formatting of this note is different from the original.  Patient: Asir Bingley MRN: [id number]  SSN: [id number]   Date of Birth: 14-Feb-1956  Age: 66 y.o.  Sex: male     Patient is status post Procedure(s):  PROCTOPLASTY.    Surgeon(s) and Role:     * Theresia Majors, MD - Primary    Local/Dose/Irrigation:  see [facility]      Peripheral IV 04/08/22 Left Hand (Active)     Airway - Endotracheal Tube 04/08/22 Oral (Active)       Dressing/Packing:  Incision 04/08/22 Anus-Dressing/Treatment: Xeroform;Gauze dressing/dressing sponge;ABD pad;Tape/Soft cloth adhesive tape (04/08/22 1550)    Splint/Cast:  ]    Other:        Electronically signed by Riki Altes at 04/08/2022  3:51 PM EDT

## 2022-04-08 NOTE — Unmapped (Signed)
Formatting of this note is different from the original.    Brief Postoperative Note    Patient: Evan Shepherd  Date of Birth: 1956-03-06  MRN: [id number]    Date of Procedure:  04/08/2022     Pre-Op Diagnosis:  Coloanal pouch prolapse  Post-Op Diagnosis:  Coloanal pouch prolapse    Procedure:  Proctoplasty  Surgeon:  Theresia Majors, MD  Surgical Assistant:  Merri Ray, RN  Anesthesia: General endotracheal  Specimens:   ID Type Source Tests Collected by Time Destination   Coloanal j-pouch wall Fresh Anal  Theresia Majors, MD 04/08/2022 1439 Pathology   Drains:  None   Estimated Blood Loss:  < 30 mL  Crystalloid:  400 mL  Complications:  None apparent  Implants:  None  Findings:  Patulous anus.  Asymmetrically prolapsing coloanal J-pouch with the greatest redundancy and mobility on the right side.  No evidence of neoplasia.    Electronically Signed by Theresia Majors, MD      Electronically signed by Theresia Majors, MD at 04/08/2022  4:01 PM EDT

## 2022-04-08 NOTE — Unmapped (Signed)
Formatting of this note might be different from the original.  Patient stating, "I feel shaking, like my blood sugar is low".  Patient's blood sugar is 56.  Dr. Cain Saupe informed of patient's blood sugar of 56.  April Ware, CRNA at bedside.  Dr. Cain Saupe gave orders to April Ware, CRNA.  April Ware, CRNA will recheck blood sugar in operating room.    Electronically signed by Edythe Clarity, RN at 04/08/2022  1:48 PM EDT

## 2022-04-08 NOTE — Op Note (Signed)
Brief Postoperative Note    Patient: Evan Shepherd  Date of Birth: Nov 09, 1956  MRN: [id number]    Date of Procedure:  04/08/2022     Pre-Op Diagnosis:  Coloanal pouch prolapse  Post-Op Diagnosis:  Coloanal pouch prolapse    Procedure:  Proctoplasty  Surgeon:  Theresia Majors, MD  Surgical Assistant:  Merri Ray, RN  Anesthesia: General endotracheal  Specimens:   ID Type Source Tests Collected by Time Destination   Coloanal j-pouch wall Fresh Anal  Theresia Majors, MD 04/08/2022 1439 Pathology   Drains:  None   Estimated Blood Loss:  < 30 mL  Crystalloid:  400 mL  Complications:  None apparent  Implants:  None  Findings:  Patulous anus.  Asymmetrically prolapsing coloanal J-pouch with the greatest redundancy and mobility on the right side.  No evidence of neoplasia.      Electronically Signed by Theresia Majors, MD

## 2022-04-08 NOTE — Interval H&P Note (Signed)
TRANSFER - OUT REPORT:    Verbal report given to [employer] (name) on [employer]  being transferred to 5 east(unit) for routine post - op       Report consisted of patient's Situation, Background, Assessment and   Recommendations(SBAR).     Time Pre op antibiotic given:1400  Anesthesia Stop time: 1610    Information from the following report(s) SBAR, Procedure Summary, Intake/Output, and MAR was reviewed with the receiving nurse.    Opportunity for questions and clarification was provided.     Is the patient on 02? NO      Is the patient on a monitor? NO    Is the nurse transporting with the patient? NO    Surgical Waiting Area notified of patient's transfer from PACU? YES      The following personal items collected during your admission accompanied patient upon transfer:   Dental Appliance: Dental Appliances: None  Vision:    Hearing Aid:    Jewelry: Jewelry: None  Clothing: Clothing: At bedside (clothes and shoes returned to pt in pacu)  Other Valuables: Other Valuables: None  Valuables sent to safe:

## 2022-04-08 NOTE — Op Note (Signed)
Op Notes signed by Theresia Majors, MD at 04/17/22 0119                 Author: Theresia Majors, MD  Service: Colon and Rectal Surgery  Author Type: Physician       Filed: 04/17/22 0119  Date of Service: 04/09/22 1321  Status: Signed          Editor: Theresia Majors, MD (Physician)               Sondra Barges [facility]   OPERATIVE REPORT      Name:  Evan Shepherd, Evan Shepherd   MR#:  [id number]   DOB:  [birth year]   ACCOUNT #:  [id number]      DATE OF SERVICE:  04/08/2022      PREOPERATIVE DIAGNOSIS:  Coloanal pouch prolapse      POSTOPERATIVE DIAGNOSIS:  Coloanal pouch prolapse      PROCEDURE PERFORMED:  Proctoplasty      SURGEON:  Theresia Majors, MD      ASSISTANT:  Merri Ray, RN      ANESTHESIA:  General endotracheal      SPECIMENS REMOVED:  Coloanal J-pouch wall      TUBES AND DRAINS:  None      ESTIMATED BLOOD LOSS:  Less than 30 mL      FLUIDS:  Crystalloid 400 mL      COMPLICATIONS:  None apparent      IMPLANTS:  None      INDICATIONS:  The patient is a 66 year old male who underwent an intersphincteric proctectomy with colonic J-pouch to anal anastomosis and diverting loop ileostomy in 2006 for treatment  of rectal cancer.  The ileostomy was closed in 2007, and he had good function for the first one or two years after the operation.  Gradually, he experienced increasing problems with fecal incontinence.  The incontinence eventually became intractable and,  on 12/03/2021, he underwent a hand-assisted laparoscopic creation of a divided loop colostomy.  The creation of the colostomy solved the fecal incontinence problem; however, the patient has continued to struggle with bleeding and wetness in the anal area  secondary to prolapse of the mucosa of the colonic J-pouch.  These symptoms have severely decreased his quality of life.  Examination in the office on 03/28/2022, which included a rigid lower endoscopy, revealed decreased resting sphincter tone and squeezing  strength  and the inflamed, redundant, prolapsing tissue.  Surgery was indicated to try to correct this problem.  The planned procedure is being called a proctoplasty, but technically that is a misnomer since there is no rectum.  In any case, it would  consist of transanal excision of the redundant prolapsing tissue with placement of sutures to close the resulting defects.  The risks of the operation were discussed in detail and the patient agreed to proceed.  [place] , the  plan also included overnight observation.      FINDINGS:  There was asymmetrically prolapsing coloanal J-pouch tissue with the greatest redundancy and mobility being on the right side.  There was no evidence of neoplasia.      PROCEDURE:  After informed consent was obtained, the patient was taken to the operating room where standard monitoring devices were attached and adequate general endotracheal anesthesia  was induced.  He was then placed in the prone jackknife position on the operating table with all pressure points padded appropriately and the lower extremities fitted with pneumatic compression stockings.  Special care was taken to avoid injuring his  colostomy.  Two grams of cefoxitin were administered parenterally.  The buttocks were retracted bilaterally using Mastisol and tape, and the perineum was prepped and draped in the usual sterile fashion.      Visual inspection and digital rectal examination were performed.  Exposure within the anal canal was initially provided by using the large Hill-Ferguson retractor.  Once the anatomy had been fully studied, the [facility] retractor was put in place and  used to evert the anus.  A 1:200,000 epinephrine and sterile injectable saline solution was prepared, and it would be infiltrated submucosally to attempt to elevate mucosa and submucosa from the muscular portion of the colonic J-pouch wall.  The solution  was injected using a 10 mL syringe and 20-gauge spinal needle.  The  tissue on the right side was addressed first as it was the most significant and the most mobile.  This tissue was placed on traction using Babcock clamps and then, after the infiltration  with the epinephrine solution, the proximal limit of what would be the dissection was tagged with a 3-0 Vicryl suture that was also used to apply some traction.  The tissue was then incised with a scalpel, and scissors were also used to begin the dissection.   The LigaSure small jaw was used as well.  This portion of the excision was full thickness and the pericolic fat was exposed.  There was no gross contamination.  The resulting defect was closed with running and interrupted 2-0 Vicryl sutures.  The same  technique was then employed in multiple areas, but not all of these resulted in the creation of full-thickness defects.  The areas addressed were in the anterior and posterior midline and in the left lateral position.  All wounds were closed with the  2-0 Vicryl sutures except for one that was very superficial at which a 3-0 Vicryl suture was used.  In the right anterior quadrant, there was also some redundant skin that was in continuity with some of the prolapsing tissue from the anal canal.  Here,  the skin redundancy was also decreased by sharp excision with scalpel and scissors.  The resulting wound was closed using 3-0 Vicryl sutures.  Final inspection of the wounds revealed good hemostasis.  The rigid proctosigmoidoscope was brought onto the  field and lubricated and inserted transanally into the colonic J-pouch which was then examined and determined to be apparently intact.  0.25% bupivacaine with epinephrine was infiltrated circumanally to provide some post-operative analgesia.  No packing  was inserted.  An external dressing consisting of Xeroform, 4x4s, and an ABD was put in place.      There were no apparent complications, and the patient appeared to have tolerated the procedure well.  At its conclusion, he was  extubated and transported in stable condition to the recovery room.  As noted above, from there, he would be admitted to the  hospital for overnight observation to monitor for urinary retention and to manage pain.            Theresia Majors, MD         [id number]   D:  04/10/2022 1:51   T:  04/10/2022 6:16   JOB #:  [id number]   CC:  Theresia Majors, MD

## 2022-04-08 NOTE — Anesthesia Post-Procedure Evaluation (Signed)
Procedure(s):  PROCTOPLASTY.    general    Anesthesia Post Evaluation        Patient location during evaluation: PACU  Patient participation: complete - patient participated  Level of consciousness: awake and alert  Pain management: adequate  Airway patency: patent  Anesthetic complications: no  Cardiovascular status: acceptable  Respiratory status: acceptable  Hydration status: acceptable  Comments: I have seen and evaluated the patient and is ready for discharge. Chyrel Masson, MD    Post anesthesia nausea and vomiting:  none      INITIAL Post-op Vital signs:   Vitals Value Taken Time   BP 107/75 04/08/22 1615   Temp 36.1 C (97 F) 04/08/22 1609   Pulse 65 04/08/22 1617   Resp 20 04/08/22 1617   SpO2 98 % 04/08/22 1617   Vitals shown include unvalidated device data.

## 2022-04-08 NOTE — H&P (Signed)
Formatting of this note is different from the original.  History and Physical (outpatient)    Patient: Evan Shepherd 66 y.o. male     Chief Complaint:  Mucosal prolapse    History of Present Illness:  The patient is a 66 year old male who underwent 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  The ileostomy was closed in 2007, and he had good function for the first one or two years after the operation. Gradually, he experienced increasing problems with fecal incontinence. That incontinence eventually became intractable, and on 12/03/2021 he underwent the hand assisted laparoscopic creation of a divided loop colostomy. 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  These symptoms have severely decreased his quality of life.  Examination in the office on 03/28/22, which included rigid lower endoscopy, revealed decreased resting sphincter tone and squeezing strength and the inflamed, redundant, prolapsing tissue.    History:  Past Medical History:   Diagnosis Date    Chronic kidney disease, stage I     Dyslipidemia     Erectile dysfunction     Fecal incontinence     GERD (gastroesophageal reflux disease)     History of colonic polyps     History of rectal cancer 2006    Treated with neoadjuvant chemoradiation followed by surgery and then, presumably, adjuvant chemotherapy. The patient appears to have been cured.    Hypertension     Type II diabetes mellitus [facility])      Past Surgical History:   Procedure Laterality Date    HX COLONOSCOPY  11/28/2020    Tressia Danas, M.D. (in [facility]) - Four polypoid lesions were excised. Two proved to have been tubular adenomas and two were lymphoid aggregates. A five-year follow-up interval was recommended.     HX COLONOSCOPY  2006    HX ENDOSCOPY  04/18/2020    EGD; Tressia Danas, MD.    HX HEENT Right 2018    CATARACT SURGERY    HX OTHER SURGICAL  10/29/2005    Intersphincteric proctectomy with colonic J-pouch to anal anastomosis and diverting loop ileostomy Aleatha Borer, M.D., [facility])    HX OTHER SURGICAL  06/22/2006    Closure of loop ileostomy and removal of subcutaneous infusion port Aleatha Borer, M.D., [facility])    HX OTHER SURGICAL  11/01/2018    ?Hemorrhoidectomy? Rosalyn Charters, M.D. in [facility]) - This operation was really the excision of prolapsing colonic J pouch mucosa using the Ligasure and closure of the defects with 3-0 Vicryl sutures.    HX OTHER SURGICAL  12/03/2021    Hand-assisted laparoscopic creation of divided loop colostomy; Dr. Nanda Quinton.    HX UROLOGICAL  10/29/2009    Insertion of Coloplast three-piece inflatable penile prosthesis (Mark C. Vernie Ammons, M.D.)    HX UROLOGICAL  12/03/2021    Cystoscopy and placement of bilateral ureteral catheters; Dennard Nip, MD.    IR INSERT TUNL CVC W PORT OVER 5 YEARS  2006    PR UNLISTED PROCEDURE ABDOMEN PERITONEUM & OMENTUM  2008    HERNIA REPAIR     Family History   Problem Relation Age of Onset    Diabetes Mother     Hypertension Father     Anesth Problems Neg Hx      Social  History     Socioeconomic History    Marital status: MARRIED     Spouse name: Not on file    Number of children: Not on file    Years of education: Not on file    Highest education level: Not on file   Occupational History    Not on file   Tobacco Use    Smoking status: Former     Types: Cigars    Smokeless tobacco: Never   Vaping Use    Vaping Use: Never used   Substance and Sexual Activity    Alcohol use: Not Currently     Comment: OCASSIONALLY    Drug use: Never    Sexual activity: Not on file   Other Topics Concern    Not on file   Social History Narrative    Not on file     Social Determinants of Health     Financial Resource Strain: Not on file   Food  Insecurity: Not on file   Transportation Needs: Not on file   Physical Activity: Not on file   Stress: Not on file   Social Connections: Not on file   Intimate Partner Violence: Not on file   Housing Stability: Not on file     Allergies: No Known Allergies    Current Medications:  Cannot display prior to admission medications because the patient has not been admitted in this contact.     No current facility-administered medications for this encounter.     Current Outpatient Medications   Medication Sig    meclizine (ANTIVERT) 25 mg tablet Take 1 Tablet by mouth four (4) times daily as needed (vertigo).    acetaminophen (TYLENOL) 500 mg tablet Take 2 Tablets by mouth every six (6) hours.    FENUGREEK SEED PO Take  by mouth daily.    gabapentin (NEURONTIN) 300 mg capsule Take 1 Capsule by mouth two (2) times a day.    ipratropium (ATROVENT) 42 mcg (0.06 %) nasal spray 2 Sprays by Both Nostrils route two (2) times a day.    cartilage/collagen/bor/hyalur (JOINT HEALTH PO) Take  by mouth daily.    labetaloL (NORMODYNE) 300 mg tablet Take 1 Tablet by mouth two (2) times a day.    lactulose (CHRONULAC) 10 gram/15 mL solution Take 15 mL by mouth as needed.    mesalamine (LIALDA) 1.2 gram delayed release tablet Take  by mouth Daily (before breakfast).    pantoprazole (PROTONIX) 40 mg tablet Take 1 Tablet by mouth daily.    Lactobac no.41/Bifidobact no.7 (PROBIOTIC-10 PO) Take  by mouth daily.    rosuvastatin (CRESTOR) 10 mg tablet Take 1 Tablet by mouth nightly.    TURMERIC PO Take  by mouth daily.    valACYclovir (VALTREX) 1 gram tablet Take 1 Tablet by mouth daily.    cholecalciferol, vitamin D3, 50 mcg (2,000 unit) tab Take  by mouth daily.    zolpidem (AMBIEN) 10 mg tablet Take  by mouth nightly as needed for Sleep.    metFORMIN (GLUCOPHAGE) 850 mg tablet Take 1 Tablet by mouth two (2) times daily (with meals).    empagliflozin (JARDIANCE) 25 mg tablet Take 1 Tablet by mouth daily.    lisinopriL (PRINIVIL, ZESTRIL) 40 mg  tablet Take 1 Tablet by mouth daily.    insulin glargine (LANTUS,BASAGLAR) 100 unit/mL (3 mL) inpn 58 Units by SubCUTAneous route nightly.    insulin aspart U-100 (NOVOLOG) 100 unit/mL (3 mL) inpn by SubCUTAneous route Before breakfast, lunch,  and dinner. 16 UNITS AT BREAKFAST, 25 UNITS AT LUNCH, 30 UNITS WITH SUPPER    SLIDING SCALE    amLODIPine (NORVASC) 10 mg tablet Take 1 Tablet by mouth every evening.    ferrous sulfate (SLOW FE PO) Take  by mouth daily. (Patient not taking: Reported on 04/03/2022)       Physical Exam:  Height 5\' 11"  (1.803 m), weight 99.3 kg (219 lb).  GENERAL:  No apparent distress.  LUNGS:  Clear to auscultation bilaterally.  HEART:  Regular rate and rhythm with no murmurs, gallops, or rubs.  NEUROLOGIC: Alert and oriented.  No gross deficits.    Alerts:    Hospital Problems  Date Reviewed: 12/03/2021   None    Laboratory:    No results for input(s): HGB, HCT, WBC, PLT, INR, BUN, CREA, K, CRCLT, HGBEXT, HCTEXT, PLTEXT, INREXT in the last 72 hours.    No lab exists for component: PTT, PT    Assessment and Plan:  The prolapse of the colonic mucosa and possibly part of the wall of the colonic J-pouch is symptomatic, and surgery is indicated to try to correct the problem. The planned procedure is being called a proctoplasty, but technically that is a misnomer since there is no rectum. In any case, it will most likely consist of the transanal excision of the redundant prolapsing tissue with placement of sutures to close the resulting defects. Depending upon the findings, a Delorme-type mucosectomy and plication might be performed.  The risks of the operation have been discussed in detail, and the patient has agreed to proceed. Because he lives in 14/05/2021, I plan to keep him for overnight observation afterwards.    Electronically signed by West Reedy, MD at 04/08/2022  1:48 AM EDT

## 2022-04-08 NOTE — Anesthesia Pre-Procedure Evaluation (Signed)
Formatting of this note is different from the original.  Relevant Problems   No relevant active problems     Anesthetic History   No history of anesthetic complications        Review of Systems / Medical History  Patient summary reviewed, nursing notes reviewed and pertinent labs reviewed    Pulmonary  Within defined limits     Neuro/Psych   Within defined limits     Cardiovascular  Within defined limits  Hypertension    Exercise tolerance: >4 METS    GI/Hepatic/Renal  Within defined limits   GERD     Endo/Other  Within defined limits  Diabetes    Cancer     Other Findings       Physical Exam    Airway  Mallampati: II  TM Distance: > 6 cm  Neck ROM: normal range of motion   Mouth opening: Normal     Cardiovascular  Regular rate and rhythm,  S1 and S2 normal,  no murmur, click, rub, or gallop     Dental  No notable dental hx      Pulmonary  Breath sounds clear to auscultation     Abdominal  GI exam deferred     Other Findings       Anesthetic Plan    ASA: 3  Anesthesia type: general    Induction: Intravenous  Anesthetic plan and risks discussed with: Patient        Electronically signed by Chyrel Masson, MD at 04/08/2022  2:29 PM EDT

## 2022-04-08 NOTE — H&P (Signed)
History and Physical (outpatient)    Patient: Evan Shepherd 66 y.o. male     Chief Complaint:  Mucosal prolapse    History of Present Illness:  The patient is a 66 year old male who underwent 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  The ileostomy was closed in 2007, and he had good function for the first one or two years after the operation. Gradually, he experienced increasing problems with fecal incontinence. That incontinence eventually became intractable, and on 12/03/2021 he underwent the hand assisted laparoscopic creation of a divided loop colostomy. 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  These symptoms have severely decreased his quality of life.  Examination in the office on 03/28/22, which included rigid lower endoscopy, revealed decreased resting sphincter tone and squeezing strength and the inflamed, redundant, prolapsing tissue.      History:  Past Medical History:   Diagnosis Date    Chronic kidney disease, stage I     Dyslipidemia     Erectile dysfunction     Fecal incontinence     GERD (gastroesophageal reflux disease)     History of colonic polyps     History of rectal cancer 2006    Treated with neoadjuvant chemoradiation followed by surgery and then, presumably, adjuvant chemotherapy. The patient appears to have been cured.    Hypertension     Type II diabetes mellitus [facility])        Past Surgical History:   Procedure Laterality Date    HX COLONOSCOPY  11/28/2020    Tressia Danas, M.D. (in [facility]) - Four polypoid lesions were excised. Two proved to have been tubular adenomas and two were lymphoid aggregates. A five-year follow-up interval was recommended.    HX COLONOSCOPY  2006    HX ENDOSCOPY  04/18/2020     EGD; Tressia Danas, MD.    HX HEENT Right 2018    CATARACT SURGERY    HX OTHER SURGICAL  10/29/2005    Intersphincteric proctectomy with colonic J-pouch to anal anastomosis and diverting loop ileostomy Aleatha Borer, M.D., [facility])    HX OTHER SURGICAL  06/22/2006    Closure of loop ileostomy and removal of subcutaneous infusion port Aleatha Borer, M.D., [facility])    HX OTHER SURGICAL  11/01/2018    "Hemorrhoidectomy" Rosalyn Charters, M.D. in [facility]) - This operation was really the excision of prolapsing colonic J pouch mucosa using the Ligasure and closure of the defects with 3-0 Vicryl sutures.    HX OTHER SURGICAL  12/03/2021    Hand-assisted laparoscopic creation of divided loop colostomy; Dr. Nanda Quinton.    HX UROLOGICAL  10/29/2009    Insertion of Coloplast three-piece inflatable penile prosthesis (Mark C. Vernie Ammons, M.D.)    HX UROLOGICAL  12/03/2021    Cystoscopy and placement of bilateral ureteral catheters; Dennard Nip, MD.    IR INSERT TUNL CVC W PORT OVER 5 YEARS  2006    PR UNLISTED PROCEDURE ABDOMEN PERITONEUM & OMENTUM  2008    HERNIA REPAIR       Family History   Problem Relation Age of Onset    Diabetes Mother     Hypertension Father     Anesth Problems Neg Hx      Social History  Socioeconomic History    Marital status: MARRIED     Spouse name: Not on file    Number of children: Not on file    Years of education: Not on file    Highest education level: Not on file   Occupational History    Not on file   Tobacco Use    Smoking status: Former     Types: Cigars    Smokeless tobacco: Never   Vaping Use    Vaping Use: Never used   Substance and Sexual Activity    Alcohol use: Not Currently     Comment: OCASSIONALLY    Drug use: Never    Sexual activity: Not on file   Other Topics Concern    Not on file   Social History Narrative    Not on file     Social Determinants of Health     Financial Resource Strain: Not on file   Food Insecurity: Not on file   Transportation Needs: Not  on file   Physical Activity: Not on file   Stress: Not on file   Social Connections: Not on file   Intimate Partner Violence: Not on file   Housing Stability: Not on file       Allergies: No Known Allergies    Current Medications:  Cannot display prior to admission medications because the patient has not been admitted in this contact.       No current facility-administered medications for this encounter.     Current Outpatient Medications   Medication Sig    meclizine (ANTIVERT) 25 mg tablet Take 1 Tablet by mouth four (4) times daily as needed (vertigo).    acetaminophen (TYLENOL) 500 mg tablet Take 2 Tablets by mouth every six (6) hours.    FENUGREEK SEED PO Take  by mouth daily.    gabapentin (NEURONTIN) 300 mg capsule Take 1 Capsule by mouth two (2) times a day.    ipratropium (ATROVENT) 42 mcg (0.06 %) nasal spray 2 Sprays by Both Nostrils route two (2) times a day.    cartilage/collagen/bor/hyalur (JOINT HEALTH PO) Take  by mouth daily.    labetaloL (NORMODYNE) 300 mg tablet Take 1 Tablet by mouth two (2) times a day.    lactulose (CHRONULAC) 10 gram/15 mL solution Take 15 mL by mouth as needed.    mesalamine (LIALDA) 1.2 gram delayed release tablet Take  by mouth Daily (before breakfast).    pantoprazole (PROTONIX) 40 mg tablet Take 1 Tablet by mouth daily.    Lactobac no.41/Bifidobact no.7 (PROBIOTIC-10 PO) Take  by mouth daily.    rosuvastatin (CRESTOR) 10 mg tablet Take 1 Tablet by mouth nightly.    TURMERIC PO Take  by mouth daily.    valACYclovir (VALTREX) 1 gram tablet Take 1 Tablet by mouth daily.    cholecalciferol, vitamin D3, 50 mcg (2,000 unit) tab Take  by mouth daily.    zolpidem (AMBIEN) 10 mg tablet Take  by mouth nightly as needed for Sleep.    metFORMIN (GLUCOPHAGE) 850 mg tablet Take 1 Tablet by mouth two (2) times daily (with meals).    empagliflozin (JARDIANCE) 25 mg tablet Take 1 Tablet by mouth daily.    lisinopriL (PRINIVIL, ZESTRIL) 40 mg tablet Take 1 Tablet by mouth daily.    insulin  glargine (LANTUS,BASAGLAR) 100 unit/mL (3 mL) inpn 58 Units by SubCUTAneous route nightly.    insulin aspart U-100 (NOVOLOG) 100 unit/mL (3 mL) inpn by SubCUTAneous route Before breakfast, lunch, and  dinner. 16 UNITS AT BREAKFAST, 25 UNITS AT LUNCH, 30 UNITS WITH SUPPER    SLIDING SCALE    amLODIPine (NORVASC) 10 mg tablet Take 1 Tablet by mouth every evening.    ferrous sulfate (SLOW FE PO) Take  by mouth daily. (Patient not taking: Reported on 04/03/2022)            Physical Exam:  Height 5\' 11"  (1.803 m), weight 99.3 kg (219 lb).  GENERAL:  No apparent distress.  LUNGS:  Clear to auscultation bilaterally.  HEART:  Regular rate and rhythm with no murmurs, gallops, or rubs.  NEUROLOGIC: Alert and oriented.  No gross deficits.      Alerts:    Hospital Problems  Date Reviewed: 12/03/2021   None      Laboratory:    No results for input(s): HGB, HCT, WBC, PLT, INR, BUN, CREA, K, CRCLT, HGBEXT, HCTEXT, PLTEXT, INREXT in the last 72 hours.    No lab exists for component: PTT, PT    Assessment and Plan:  The prolapse of the colonic mucosa and possibly part of the wall of the colonic J-pouch is symptomatic, and surgery is indicated to try to correct the problem. The planned procedure is being called a proctoplasty, but technically that is a misnomer since there is no rectum. In any case, it will most likely consist of the transanal excision of the redundant prolapsing tissue with placement of sutures to close the resulting defects. Depending upon the findings, a Delorme-type mucosectomy and plication might be performed.  The risks of the operation have been discussed in detail, and the patient has agreed to proceed. Because he lives in 14/05/2021, I plan to keep him for overnight observation afterwards.

## 2022-04-08 NOTE — Interval H&P Note (Signed)
Patient stating, "I feel shaking, like my blood sugar is low".  Patient's blood sugar is 56.  Dr. Cain Saupe informed of patient's blood sugar of 56.  April Ware, CRNA at bedside.  Dr. Cain Saupe gave orders to April Ware, CRNA.  April Ware, CRNA will recheck blood sugar in operating room.

## 2022-04-08 NOTE — Unmapped (Signed)
Formatting of this note might be different from the original.  To be dictated.  Electronically signed by Theresia Majors, MD at 04/08/2022  3:54 PM EDT

## 2022-04-08 NOTE — Unmapped (Signed)
Formatting of this note might be different from the original.  Family updated at 2:23 PM by Riki Altes.  Epimenio Foot, [phone]  Electronically signed by Riki Altes at 04/08/2022  2:24 PM EDT

## 2022-04-08 NOTE — Unmapped (Signed)
Formatting of this note is different from the original.  TRANSFER - OUT REPORT:    Verbal report given to [employer] (name) on [employer]  being transferred to 5 east(unit) for routine post - op       Report consisted of patient?s Situation, Background, Assessment and   Recommendations(SBAR).     Time Pre op antibiotic given:1400  Anesthesia Stop time: 1610    Information from the following report(s) SBAR, Procedure Summary, Intake/Output, and MAR was reviewed with the receiving nurse.    Opportunity for questions and clarification was provided.     Is the patient on 02? NO    Is the patient on a monitor? NO    Is the nurse transporting with the patient? NO    Surgical Waiting Area notified of patient's transfer from PACU? YES    The following personal items collected during your admission accompanied patient upon transfer:   Dental Appliance: Dental Appliances: None  Vision:    Hearing Aid:    Jewelry: Jewelry: None  Clothing: Clothing: At bedside (clothes and shoes returned to pt in pacu)  Other Valuables: Other Valuables: None  Valuables sent to safe:       Electronically signed by Quintella Reichert, RN at 04/08/2022  6:29 PM EDT

## 2022-04-08 NOTE — Anesthesia Post-Procedure Evaluation (Signed)
Formatting of this note is different from the original.    Procedure(s):  PROCTOPLASTY.    general    Anesthesia Post Evaluation    Patient location during evaluation: PACU  Patient participation: complete - patient participated  Level of consciousness: awake and alert  Pain management: adequate  Airway patency: patent  Anesthetic complications: no  Cardiovascular status: acceptable  Respiratory status: acceptable  Hydration status: acceptable  Comments: I have seen and evaluated the patient and is ready for discharge. Chyrel Masson, MD    Post anesthesia nausea and vomiting:  none    INITIAL Post-op Vital signs:   Vitals Value Taken Time   BP 107/75 04/08/22 1615   Temp 36.1 C (97 F) 04/08/22 1609   Pulse 65 04/08/22 1617   Resp 20 04/08/22 1617   SpO2 98 % 04/08/22 1617   Vitals shown include unvalidated device data.  Electronically signed by Chyrel Masson, MD at 04/08/2022  4:18 PM EDT

## 2022-04-08 NOTE — Anesthesia Pre-Procedure Evaluation (Signed)
Relevant Problems   No relevant active problems       Anesthetic History   No history of anesthetic complications            Review of Systems / Medical History  Patient summary reviewed, nursing notes reviewed and pertinent labs reviewed    Pulmonary  Within defined limits                 Neuro/Psych   Within defined limits           Cardiovascular  Within defined limits  Hypertension              Exercise tolerance: >4 METS     GI/Hepatic/Renal  Within defined limits   GERD           Endo/Other  Within defined limits  Diabetes    Cancer     Other Findings              Physical Exam    Airway  Mallampati: II  TM Distance: > 6 cm  Neck ROM: normal range of motion   Mouth opening: Normal     Cardiovascular  Regular rate and rhythm,  S1 and S2 normal,  no murmur, click, rub, or gallop             Dental  No notable dental hx       Pulmonary  Breath sounds clear to auscultation               Abdominal  GI exam deferred       Other Findings            Anesthetic Plan    ASA: 3  Anesthesia type: general          Induction: Intravenous  Anesthetic plan and risks discussed with: Patient

## 2022-04-08 NOTE — Progress Notes (Signed)
Patient arrived to unit. Patient is alert and resting comfortably. Vital signs completed. Call bell within reach. All immediate needs addressed.

## 2022-04-08 NOTE — Interval H&P Note (Signed)
Family updated at 2:23 PM by Riki Altes.  Epimenio Foot, [phone]

## 2022-04-09 DIAGNOSIS — Z794 Long term (current) use of insulin: Secondary | ICD-10-CM | POA: Diagnosis not present

## 2022-04-09 DIAGNOSIS — E785 Hyperlipidemia, unspecified: Secondary | ICD-10-CM | POA: Diagnosis not present

## 2022-04-09 DIAGNOSIS — K219 Gastro-esophageal reflux disease without esophagitis: Secondary | ICD-10-CM | POA: Diagnosis not present

## 2022-04-09 DIAGNOSIS — I129 Hypertensive chronic kidney disease with stage 1 through stage 4 chronic kidney disease, or unspecified chronic kidney disease: Secondary | ICD-10-CM | POA: Diagnosis not present

## 2022-04-09 DIAGNOSIS — E1122 Type 2 diabetes mellitus with diabetic chronic kidney disease: Secondary | ICD-10-CM | POA: Diagnosis not present

## 2022-04-09 DIAGNOSIS — Z7984 Long term (current) use of oral hypoglycemic drugs: Secondary | ICD-10-CM | POA: Diagnosis not present

## 2022-04-09 DIAGNOSIS — K623 Rectal prolapse: Secondary | ICD-10-CM | POA: Diagnosis not present

## 2022-04-09 DIAGNOSIS — Z87891 Personal history of nicotine dependence: Secondary | ICD-10-CM | POA: Diagnosis not present

## 2022-04-09 DIAGNOSIS — N181 Chronic kidney disease, stage 1: Secondary | ICD-10-CM | POA: Diagnosis not present

## 2022-04-09 LAB — METABOLIC PANEL, BASIC
BUN/Creatinine ratio: 14 (ref 12–20)
BUN: 17 MG/DL (ref 6–20)
CO2: 24 mmol/L (ref 21–32)
Calcium: 8.8 MG/DL (ref 8.5–10.1)
Chloride: 107 mmol/L (ref 97–108)
Creatinine: 1.18 MG/DL (ref 0.70–1.30)
Glucose: 246 mg/dL — ABNORMAL HIGH (ref 65–100)
Potassium: 4.7 mmol/L (ref 3.5–5.1)
Sodium: 137 mmol/L (ref 136–145)
eGFR: >60 mL/min/{1.73_m2} (ref >60)

## 2022-04-09 LAB — GLUCOSE, POC
Glucose (POC): 258 mg/dL — ABNORMAL HIGH (ref 65–117)
Glucose (POC): 249 mg/dL — ABNORMAL HIGH (ref 65–117)
Glucose (POC): 171 mg/dL — ABNORMAL HIGH (ref 65–117)

## 2022-04-09 LAB — CBC W/O DIFF
ABSOLUTE NRBC: 0 10*3/uL (ref 0.00–0.01)
HCT: 47.1 % (ref 36.6–50.3)
HGB: 14.2 g/dL (ref 12.1–17.0)
MCH: 25 PG — ABNORMAL LOW (ref 26.0–34.0)
MCHC: 30.1 g/dL (ref 30.0–36.5)
MCV: 82.9 FL (ref 80.0–99.0)
MPV: 10.9 FL (ref 8.9–12.9)
NRBC: 0 PER 100 WBC
PLATELET: 246 10*3/uL (ref 150–400)
RBC: 5.68 M/uL (ref 4.10–5.70)
RDW: 14.9 % — ABNORMAL HIGH (ref 11.5–14.5)
WBC: 8.8 10*3/uL (ref 4.1–11.1)

## 2022-04-09 LAB — CBC
Hematocrit: 47.1 % (ref 36.6–50.3)
Hemoglobin: 14.2 g/dL (ref 12.1–17.0)
MCH: 25 PG — ABNORMAL LOW (ref 26.0–34.0)
MCHC: 30.1 g/dL (ref 30.0–36.5)
MCV: 82.9 FL (ref 80.0–99.0)
MPV: 10.9 FL (ref 8.9–12.9)
NRBC Absolute: 0 10*3/uL (ref 0.00–0.01)
Nucleated RBCs: 0 PER 100 WBC
Platelets: 246 10*3/uL (ref 150–400)
RBC: 5.68 M/uL (ref 4.10–5.70)
RDW: 14.9 % — ABNORMAL HIGH (ref 11.5–14.5)
WBC: 8.8 10*3/uL (ref 4.1–11.1)

## 2022-04-09 LAB — POCT GLUCOSE
POC Glucose: 258 mg/dL — ABNORMAL HIGH (ref 65–117)
POC Glucose: 249 mg/dL — ABNORMAL HIGH (ref 65–117)
POC Glucose: 171 mg/dL — ABNORMAL HIGH (ref 65–117)

## 2022-04-09 LAB — BASIC METABOLIC PANEL
Anion Gap: 6 mmol/L (ref 5–15)
BUN/Creatinine Ratio: 14 (ref 12–20)
BUN: 17 MG/DL (ref 6–20)
CO2: 24 mmol/L (ref 21–32)
Calcium: 8.8 MG/DL (ref 8.5–10.1)
Chloride: 107 mmol/L (ref 97–108)
Creatinine: 1.18 MG/DL (ref 0.70–1.30)
Est, Glom Filt Rate: >60 mL/min/{1.73_m2} (ref >60)
Glucose: 246 mg/dL — ABNORMAL HIGH (ref 65–100)
Potassium: 4.7 mmol/L (ref 3.5–5.1)
Sodium: 137 mmol/L (ref 136–145)

## 2022-04-09 MED ORDER — ACETAMINOPHEN 500 MG TAB
500 mg | ORAL_TABLET | Freq: Four times a day (QID) | ORAL | 0 refills | Status: AC | PRN
Start: 2022-04-09 — End: ?

## 2022-04-09 MED ORDER — OXYCODONE 5 MG TAB
5 mg | ORAL_TABLET | ORAL | 0 refills | Status: AC | PRN
Start: 2022-04-09 — End: 2022-04-16

## 2022-04-09 MED FILL — OXYCODONE 5 MG TAB: 5 mg | ORAL | Qty: 1

## 2022-04-09 MED FILL — JARDIANCE 10 MG TABLET: 10 mg | ORAL | Qty: 1

## 2022-04-09 MED FILL — OXYCODONE 5 MG TAB: 5 mg | ORAL | Qty: 2

## 2022-04-09 MED FILL — LISINOPRIL 20 MG TAB: 20 mg | ORAL | Qty: 2

## 2022-04-09 MED FILL — LABETALOL 100 MG TAB: 100 mg | ORAL | Qty: 3

## 2022-04-09 MED FILL — INSULIN LISPRO 100 UNIT/ML INJECTION: 100 unit/mL | SUBCUTANEOUS | Qty: 1

## 2022-04-09 MED FILL — ACETAMINOPHEN 500 MG TAB: 500 mg | ORAL | Qty: 2

## 2022-04-09 MED FILL — ONDANSETRON 4 MG TAB, RAPID DISSOLVE: 4 mg | ORAL | Qty: 1

## 2022-04-09 MED FILL — INSULIN GLARGINE 100 UNIT/ML INJECTION: 100 unit/mL | SUBCUTANEOUS | Qty: 1

## 2022-04-09 MED FILL — VALTREX 500 MG TABLET: 500 mg | ORAL | Qty: 2

## 2022-04-09 MED FILL — GABAPENTIN 300 MG CAP: 300 mg | ORAL | Qty: 1

## 2022-04-09 MED FILL — PANTOPRAZOLE 40 MG TAB, DELAYED RELEASE: 40 mg | ORAL | Qty: 1

## 2022-04-09 NOTE — Progress Notes (Signed)
RUR: Observation     TOC: Anticipated discharge home. Patient's sister will provide transport home once medically stable. Follow-up with PCP/specialist.      Primary Contact: Wife, Evan Shepherd, [phone]    Care Management Interventions  PCP Verified by CM: Yes  Mode of Transport at Discharge: Other (see comment) (Wife)  Transition of Care Consult (CM Consult): Discharge Planning  Physical Therapy Consult: No  Occupational Therapy Consult: No  Speech Therapy Consult: No  Support Systems: Spouse/Significant Other, Other Family Member(s), Child(ren)  Confirm Follow Up Transport: Self  The Plan for Transition of Care is Related to the Following Treatment Goals : Home  Discharge Location  Patient Expects to be Discharged to:: Home with family assistance    Reason for Admission:  P/O state                   RUR Score:     Observation                 Plan for utilizing home health:    None      PCP: First and Last name:  Other, Phys, MD     Name of Practice:    Are you a current patient: Yes/No:    Approximate date of last visit:    Can you participate in a virtual visit with your PCP: Yes                    Current Advanced Directive/Advance Care Plan: Full Code      Healthcare Decision Maker:   Click here to complete [name] including selection of the Healthcare Decision Maker Relationship (ie "Primary")             Primary Decision Maker: Evan Shepherd,Evan Shepherd - Spouse - [phone]                  Transition of Care Plan:     Home     CM met with patient and wife at bedside to introduce self and explain role. Patient lives with his wife in a 2 story home with 4 steps to enter and 14 steps to enter the 2nd floor. Patient has a Sports administrator bed/bath set-up. Patient was independent at baseline with ADL's, IADL's and ambulation. Patient does not own any DME. CM verified patient's PCP, demographics, pharmacy and insurance. No HH needs indicated. Patient's wife will provide transport home once  medically stable.     Miquel Dunn, MSW   [phone]

## 2022-04-09 NOTE — Progress Notes (Signed)
Formatting of this note is different from the original.  General Daily Progress Note    Admission Date: 04/08/2022  Hospital Day 2  Post-Op Day 1  Subjective:     Objective:   Patient Vitals for the past 24 hrs:   BP Temp Pulse Resp SpO2   04/09/22 0750 129/73 99 F (37.2 C) 71 16 95 %   04/09/22 0351 121/75 97.4 F (36.3 C) 70 16 96 %   04/08/22 2013 (!) 170/67 97.6 F (36.4 C) 69 16 97 %   04/08/22 1844 (!) 159/80 97.6 F (36.4 C) 67 14 96 %   04/08/22 1815 (!) 141/89 -- 74 14 100 %   04/08/22 1800 129/80 -- 67 11 100 %   04/08/22 1745 130/77 -- 67 16 97 %   04/08/22 1730 128/79 -- 69 18 98 %   04/08/22 1715 131/67 -- 68 19 99 %   04/08/22 1700 132/85 -- 65 16 97 %   04/08/22 1645 (!) 142/81 -- 68 18 99 %   04/08/22 1642 (!) 142/81 97.6 F (36.4 C) 67 10 99 %   04/08/22 1630 116/77 -- 67 17 100 %   04/08/22 1625 123/79 -- 66 14 99 %   04/08/22 1620 114/76 -- 65 18 97 %   04/08/22 1615 107/75 -- 63 17 97 %   04/08/22 1610 102/65 -- 62 19 97 %   04/08/22 1609 107/64 97 F (36.1 C) 63 12 90 %   04/08/22 1605 107/64 -- 62 19 91 %   04/08/22 1604 107/64 97.4 F (36.3 C) 62 20 92 %   04/08/22 1200 (!) 137/93 98.2 F (36.8 C) 71 -- 98 %     No intake/output data recorded.  04/10 1901 - 04/12 0700  In: 800 [P.O.:200; I.V.:600]  Out: 910 [Urine:900]      Physical Examination:  General Appearance - No distress.  Abdomen - Soft.  Non-tender.  Non-distended.  Perineum - Small amount of blood on the dressing.  External wound intact and without erythema or ecchymosis.  Internal examination not performed.       Data Review   Recent Results (from the past 24 hour(s))   GLUCOSE, POC    Collection Time: 04/08/22 12:12 PM   Result Value Ref Range    Glucose (POC) 95 65 - 117 mg/dL    Performed by [facility] April  RN    GLUCOSE, POC    Collection Time: 04/08/22  1:42 PM   Result Value Ref Range    Glucose (POC) 56 (L) 65 - 117 mg/dL    Performed by Lorrene Reid    GLUCOSE, POC    Collection Time: 04/08/22  2:24 PM   Result  Value Ref Range    Glucose (POC) 138 (H) 65 - 117 mg/dL    Performed by Bonne Dolores    GLUCOSE, POC    Collection Time: 04/08/22  4:05 PM   Result Value Ref Range    Glucose (POC) 120 (H) 65 - 117 mg/dL    Performed by Loree Fee  RN    GLUCOSE, POC    Collection Time: 04/08/22  9:10 PM   Result Value Ref Range    Glucose (POC) 258 (H) 65 - 117 mg/dL    Performed by CROUCH Eris   PCT    GLUCOSE, POC    Collection Time: 04/08/22  9:54 PM   Result Value Ref Range    Glucose (POC) 249 (H) 65 -  117 mg/dL    Performed by CROUCH Eris   PCT    METABOLIC PANEL, BASIC    Collection Time: 04/09/22  2:50 AM   Result Value Ref Range    Sodium 137 136 - 145 mmol/L    Potassium 4.7 3.5 - 5.1 mmol/L    Chloride 107 97 - 108 mmol/L    CO2 24 21 - 32 mmol/L    Anion gap 6 5 - 15 mmol/L    Glucose 246 (H) 65 - 100 mg/dL    BUN 17 6 - 20 MG/DL    Creatinine 1.18 0.70 - 1.30 MG/DL    BUN/Creatinine ratio 14 12 - 20      eGFR >60 >60 ml/min/1.44m    Calcium 8.8 8.5 - 10.1 MG/DL   CBC W/O DIFF    Collection Time: 04/09/22  2:50 AM   Result Value Ref Range    WBC 8.8 4.1 - 11.1 K/uL    RBC 5.68 4.10 - 5.70 M/uL    HGB 14.2 12.1 - 17.0 g/dL    HCT 47.1 36.6 - 50.3 %    MCV 82.9 80.0 - 99.0 FL    MCH 25.0 (L) 26.0 - 34.0 PG    MCHC 30.1 30.0 - 36.5 g/dL    RDW 14.9 (H) 11.5 - 14.5 %    PLATELET 246 150 - 400 K/uL    MPV 10.9 8.9 - 12.9 FL    NRBC 0.0 0 PER 100 WBC    ABSOLUTE NRBC 0.00 0.00 - 0.01 K/uL   GLUCOSE, POC    Collection Time: 04/09/22  6:22 AM   Result Value Ref Range    Glucose (POC) 171 (H) 65 - 117 mg/dL    Performed by CROUCH Eris   PCT        Assessment:     Active Problems:    Post-operative state (04/08/2022)    1 Day Post-Op s/p proctoplasty.    Hemodynamically stable.  Pain control adequate.  Voiding well.  Laboratory results are acceptable.    The patient appears to be fit for discharge to home.    Plan:   Discharge to home.  Follow up with me in approximately 2  weeks.                                    Electronically signed by GOlena Leatherwood MD at 04/09/2022 11:58 AM EDT

## 2022-04-09 NOTE — Progress Notes (Signed)
Formatting of this note might be different from the original.  Medicare Outpatient Observation Notice (MOON)/ provided to patient/representative with verbal explanation of the notice. Time allotted for questions regarding the notice.  Patient /representative provided a completed copy of the MOON/ notice.  Copy placed on bedside chart.    Electronically signed by Patria Mane at 04/09/2022  2:13 PM EDT

## 2022-04-09 NOTE — Progress Notes (Signed)
Formatting of this note might be different from the original.  RUR: Observation     TOC: Anticipated discharge home. Patient's sister will provide transport home once medically stable. Follow-up with PCP/specialist.     Primary Contact: Wife, Nikholas Geffre, [phone]    Care Management Interventions  PCP Verified by CM: Yes  Mode of Transport at Discharge: Other (see comment) (Wife)  Transition of Care Consult (CM Consult): Discharge Planning  Physical Therapy Consult: No  Occupational Therapy Consult: No  Speech Therapy Consult: No  Support Systems: Spouse/Significant Other, Other Family Member(s), Child(ren)  Confirm Follow Up Transport: Self  The Plan for Transition of Care is Related to the Following Treatment Goals : Home  Discharge Location  Patient Expects to be Discharged to:: Home with family assistance    Reason for Admission:  P/O state    RUR Score:     Observation          Plan for utilizing home health:    None      PCP: First and Last name:  Other, Phys, MD     Name of Practice:    Are you a current patient: Yes/No:    Approximate date of last visit:    Can you participate in a virtual visit with your PCP: Yes    Current Advanced Directive/Advance Care Plan: Full Code    Healthcare Decision Maker:   Click here to complete [place] including selection of the Healthcare Decision Maker Relationship (ie "Primary")      Primary Decision Maker: Birenbaum,karima - Spouse - [phone]    Transition of Care Plan:     Home    CM met with patient and wife at bedside to introduce self and explain role. Patient lives with his wife in a 2 story home with 4 steps to enter and 14 steps to enter the 2nd floor. Patient has a Sports administrator bed/bath set-up. Patient was independent at baseline with ADL's, IADL's and ambulation. Patient does not own any DME. CM verified patient's PCP, demographics, pharmacy and insurance. No HH needs indicated. Patient's wife will provide transport home once  medically stable.     Miquel Dunn, MSW   [phone]  Electronically signed by Miquel Dunn at 04/09/2022 11:57 AM EDT

## 2022-04-09 NOTE — Unmapped (Signed)
Formatting of this note might be different from the original.  [facility]  OPERATIVE REPORT    Name:  Evan Shepherd, Evan Shepherd  MR#:  [id number]  DOB:  Apr 05, 1956  ACCOUNT #:  [id number]    DATE OF SERVICE:  04/08/2022    PREOPERATIVE DIAGNOSIS:  Coloanal pouch prolapse    POSTOPERATIVE DIAGNOSIS:  Coloanal pouch prolapse    PROCEDURE PERFORMED:  Proctoplasty    SURGEON:  Theresia Majors, MD    ASSISTANT:  Merri Ray, RN    ANESTHESIA:  General endotracheal    SPECIMENS REMOVED:  Coloanal J-pouch wall    TUBES AND DRAINS:  None    ESTIMATED BLOOD LOSS:  Less than 30 mL    FLUIDS:  Crystalloid 400 mL    COMPLICATIONS:  None apparent    IMPLANTS:  None    INDICATIONS:  The patient is a 66 year old male who underwent 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  The ileostomy was closed in 2007, and he had good function for the first one or two years after the operation.  Gradually, he experienced increasing problems with fecal incontinence.  The incontinence eventually became intractable and, on 12/03/2021, he underwent a hand-assisted laparoscopic creation of a divided loop colostomy.  The creation of the colostomy solved the fecal incontinence problem; however, the patient has continued to struggle with bleeding and wetness in the anal area secondary to prolapse of the mucosa of the colonic J-pouch.  These symptoms have severely decreased his quality of life.  Examination in the office on 03/28/2022, which included a rigid lower endoscopy, revealed decreased resting sphincter tone and squeezing strength and the inflamed, redundant, prolapsing tissue.  Surgery was indicated to try to correct this problem.  The planned procedure is being called a proctoplasty, but technically that is a misnomer since there is no rectum.  In any case, it would consist of transanal excision of the redundant  prolapsing tissue with placement of sutures to close the resulting defects.  The risks of the operation were discussed in detail and the patient agreed to proceed.  Because he lives in [place], the plan also included overnight observation.    FINDINGS:  There was asymmetrically prolapsing coloanal J-pouch tissue with the greatest redundancy and mobility being on the right side.  There was no evidence of neoplasia.    PROCEDURE:  After informed consent was obtained, the patient was taken to the operating room where standard monitoring devices were attached and adequate general endotracheal anesthesia was induced.  He was then placed in the prone jackknife position on the operating table with all pressure points padded appropriately and the lower extremities fitted with pneumatic compression stockings.  Special care was taken to avoid injuring his colostomy.  Two grams of cefoxitin were administered parenterally.  The buttocks were retracted bilaterally using Mastisol and tape, and the perineum was prepped and draped in the usual sterile fashion.    Visual inspection and digital rectal examination were performed.  Exposure within the anal canal was initially provided by using the large Hill-Ferguson retractor.  Once the anatomy had been fully studied, the [facility] retractor was put in place and used to evert the anus.  A 1:200,000 epinephrine and sterile injectable saline solution was prepared, and it would be infiltrated submucosally to attempt to elevate mucosa and submucosa from the muscular portion of the colonic J-pouch wall.  The solution  was injected using a 10 mL syringe and 20-gauge spinal needle.  The tissue on the right side was addressed first as it was the most significant and the most mobile.  This tissue was placed on traction using Babcock clamps and then, after the infiltration with the epinephrine solution, the proximal limit of what would be the dissection was tagged with a 3-0 Vicryl suture  that was also used to apply some traction.  The tissue was then incised with a scalpel, and scissors were also used to begin the dissection.  The LigaSure small jaw was used as well.  This portion of the excision was full thickness and the pericolic fat was exposed.  There was no gross contamination.  The resulting defect was closed with running and interrupted 2-0 Vicryl sutures.  The same technique was then employed in multiple areas, but not all of these resulted in the creation of full-thickness defects.  The areas addressed were in the anterior and posterior midline and in the left lateral position.  All wounds were closed with the 2-0 Vicryl sutures except for one that was very superficial at which a 3-0 Vicryl suture was used.  In the right anterior quadrant, there was also some redundant skin that was in continuity with some of the prolapsing tissue from the anal canal.  Here, the skin redundancy was also decreased by sharp excision with scalpel and scissors.  The resulting wound was closed using 3-0 Vicryl sutures.  Final inspection of the wounds revealed good hemostasis.  The rigid proctosigmoidoscope was brought onto the field and lubricated and inserted transanally into the colonic J-pouch which was then examined and determined to be apparently intact.  0.25% bupivacaine with epinephrine was infiltrated circumanally to provide some post-operative analgesia.  No packing was inserted.  An external dressing consisting of Xeroform, 4x4s, and an ABD was put in place.    There were no apparent complications, and the patient appeared to have tolerated the procedure well.  At its conclusion, he was extubated and transported in stable condition to the recovery room.  As noted above, from there, he would be admitted to the hospital for overnight observation to monitor for urinary retention and to manage pain.    Theresia Majors, MD    [id number]  D:  04/10/2022 1:51  T:  04/10/2022 6:16  JOB #:   [id number]  CC:  Theresia Majors, MD  Electronically signed by Theresia Majors, MD at 04/17/2022  1:19 AM EDT

## 2022-04-09 NOTE — Progress Notes (Signed)
Medicare Outpatient Observation Notice (MOON)/ provided to patient/representative with verbal explanation of the notice. Time allotted for questions regarding the notice.  Patient /representative provided a completed copy of the MOON/ notice.  Copy placed on bedside chart.

## 2022-04-09 NOTE — Progress Notes (Signed)
General Daily Progress Note    Admission Date: 04/08/2022  Hospital Day 2  Post-Op Day 1  Subjective:             Objective:   Patient Vitals for the past 24 hrs:   BP Temp Pulse Resp SpO2   04/09/22 0750 129/73 99 F (37.2 C) 71 16 95 %   04/09/22 0351 121/75 97.4 F (36.3 C) 70 16 96 %   04/08/22 2013 (!) 170/67 97.6 F (36.4 C) 69 16 97 %   04/08/22 1844 (!) 159/80 97.6 F (36.4 C) 67 14 96 %   04/08/22 1815 (!) 141/89 -- 74 14 100 %   04/08/22 1800 129/80 -- 67 11 100 %   04/08/22 1745 130/77 -- 67 16 97 %   04/08/22 1730 128/79 -- 69 18 98 %   04/08/22 1715 131/67 -- 68 19 99 %   04/08/22 1700 132/85 -- 65 16 97 %   04/08/22 1645 (!) 142/81 -- 68 18 99 %   04/08/22 1642 (!) 142/81 97.6 F (36.4 C) 67 10 99 %   04/08/22 1630 116/77 -- 67 17 100 %   04/08/22 1625 123/79 -- 66 14 99 %   04/08/22 1620 114/76 -- 65 18 97 %   04/08/22 1615 107/75 -- 63 17 97 %   04/08/22 1610 102/65 -- 62 19 97 %   04/08/22 1609 107/64 97 F (36.1 C) 63 12 90 %   04/08/22 1605 107/64 -- 62 19 91 %   04/08/22 1604 107/64 97.4 F (36.3 C) 62 20 92 %   04/08/22 1200 (!) 137/93 98.2 F (36.8 C) 71 -- 98 %     No intake/output data recorded.  04/10 1901 - 04/12 0700  In: 800 [P.O.:200; I.V.:600]  Out: 910 [Urine:900]       Physical Examination:  General Appearance - No distress.  Abdomen - Soft.  Non-tender.  Non-distended.  Perineum - Small amount of blood on the dressing.  External wound intact and without erythema or ecchymosis.  Internal examination not performed.             Data Review   Recent Results (from the past 24 hour(s))   GLUCOSE, POC    Collection Time: 04/08/22 12:12 PM   Result Value Ref Range    Glucose (POC) 95 65 - 117 mg/dL    Performed by [facility] April  RN    GLUCOSE, POC    Collection Time: 04/08/22  1:42 PM   Result Value Ref Range    Glucose (POC) 56 (L) 65 - 117 mg/dL    Performed by Lorrene Reid    GLUCOSE, POC    Collection Time: 04/08/22  2:24 PM   Result Value Ref Range    Glucose (POC) 138 (H) 65  - 117 mg/dL    Performed by Bonne Dolores    GLUCOSE, POC    Collection Time: 04/08/22  4:05 PM   Result Value Ref Range    Glucose (POC) 120 (H) 65 - 117 mg/dL    Performed by Loree Fee  RN    GLUCOSE, POC    Collection Time: 04/08/22  9:10 PM   Result Value Ref Range    Glucose (POC) 258 (H) 65 - 117 mg/dL    Performed by CROUCH Eris   PCT    GLUCOSE, POC    Collection Time: 04/08/22  9:54 PM   Result Value Ref Range  Glucose (POC) 249 (H) 65 - 117 mg/dL    Performed by CROUCH Eris   PCT    METABOLIC PANEL, BASIC    Collection Time: 04/09/22  2:50 AM   Result Value Ref Range    Sodium 137 136 - 145 mmol/L    Potassium 4.7 3.5 - 5.1 mmol/L    Chloride 107 97 - 108 mmol/L    CO2 24 21 - 32 mmol/L    Anion gap 6 5 - 15 mmol/L    Glucose 246 (H) 65 - 100 mg/dL    BUN 17 6 - 20 MG/DL    Creatinine 1.18 0.70 - 1.30 MG/DL    BUN/Creatinine ratio 14 12 - 20      eGFR >60 >60 ml/min/1.17m    Calcium 8.8 8.5 - 10.1 MG/DL   CBC W/O DIFF    Collection Time: 04/09/22  2:50 AM   Result Value Ref Range    WBC 8.8 4.1 - 11.1 K/uL    RBC 5.68 4.10 - 5.70 M/uL    HGB 14.2 12.1 - 17.0 g/dL    HCT 47.1 36.6 - 50.3 %    MCV 82.9 80.0 - 99.0 FL    MCH 25.0 (L) 26.0 - 34.0 PG    MCHC 30.1 30.0 - 36.5 g/dL    RDW 14.9 (H) 11.5 - 14.5 %    PLATELET 246 150 - 400 K/uL    MPV 10.9 8.9 - 12.9 FL    NRBC 0.0 0 PER 100 WBC    ABSOLUTE NRBC 0.00 0.00 - 0.01 K/uL   GLUCOSE, POC    Collection Time: 04/09/22  6:22 AM   Result Value Ref Range    Glucose (POC) 171 (H) 65 - 117 mg/dL    Performed by CROUCH Eris   PCT            Assessment:     Active Problems:    Post-operative state (04/08/2022)        1 Day Post-Op s/p proctoplasty.    Hemodynamically stable.  Pain control adequate.  Voiding well.  Laboratory results are acceptable.    The patient appears to be fit for discharge to home.    Plan:   Discharge to home.  Follow up with me in approximately 2 weeks.

## 2022-04-15 ENCOUNTER — Encounter: Payer: Self-pay | Admitting: Gastroenterology

## 2022-04-22 DIAGNOSIS — Z933 Colostomy status: Secondary | ICD-10-CM | POA: Diagnosis not present

## 2022-04-22 DIAGNOSIS — Z9889 Other specified postprocedural states: Secondary | ICD-10-CM | POA: Diagnosis not present

## 2022-04-24 DIAGNOSIS — M25532 Pain in left wrist: Secondary | ICD-10-CM | POA: Diagnosis not present

## 2022-04-24 DIAGNOSIS — M19032 Primary osteoarthritis, left wrist: Secondary | ICD-10-CM | POA: Diagnosis not present

## 2022-04-29 DIAGNOSIS — E1165 Type 2 diabetes mellitus with hyperglycemia: Secondary | ICD-10-CM | POA: Diagnosis not present

## 2022-04-30 DIAGNOSIS — I499 Cardiac arrhythmia, unspecified: Secondary | ICD-10-CM | POA: Diagnosis not present

## 2022-04-30 DIAGNOSIS — I498 Other specified cardiac arrhythmias: Secondary | ICD-10-CM | POA: Diagnosis not present

## 2022-05-05 DIAGNOSIS — R079 Chest pain, unspecified: Secondary | ICD-10-CM | POA: Diagnosis not present

## 2022-05-05 DIAGNOSIS — Z79899 Other long term (current) drug therapy: Secondary | ICD-10-CM | POA: Diagnosis not present

## 2022-05-05 DIAGNOSIS — I119 Hypertensive heart disease without heart failure: Secondary | ICD-10-CM | POA: Diagnosis not present

## 2022-05-05 DIAGNOSIS — I209 Angina pectoris, unspecified: Secondary | ICD-10-CM | POA: Diagnosis not present

## 2022-05-05 DIAGNOSIS — Z85048 Personal history of other malignant neoplasm of rectum, rectosigmoid junction, and anus: Secondary | ICD-10-CM | POA: Diagnosis not present

## 2022-05-05 DIAGNOSIS — I493 Ventricular premature depolarization: Secondary | ICD-10-CM | POA: Diagnosis not present

## 2022-05-05 DIAGNOSIS — I371 Nonrheumatic pulmonary valve insufficiency: Secondary | ICD-10-CM | POA: Diagnosis not present

## 2022-05-05 DIAGNOSIS — E119 Type 2 diabetes mellitus without complications: Secondary | ICD-10-CM | POA: Diagnosis not present

## 2022-05-05 DIAGNOSIS — R0609 Other forms of dyspnea: Secondary | ICD-10-CM | POA: Diagnosis not present

## 2022-05-07 ENCOUNTER — Inpatient Hospital Stay (HOSPITAL_COMMUNITY)
Admission: EM | Admit: 2022-05-07 | Discharge: 2022-05-09 | DRG: 390 | Disposition: A | Discharge status: Home / Self Care | Payer: Medicare HMO | Attending: Internal Medicine | Admitting: Internal Medicine

## 2022-05-07 ENCOUNTER — Inpatient Hospital Stay (HOSPITAL_COMMUNITY): Payer: Medicare HMO

## 2022-05-07 ENCOUNTER — Other Ambulatory Visit: Payer: Self-pay

## 2022-05-07 ENCOUNTER — Emergency Department (HOSPITAL_COMMUNITY): Payer: Medicare HMO

## 2022-05-07 ENCOUNTER — Encounter (HOSPITAL_COMMUNITY): Payer: Self-pay | Admitting: *Deleted

## 2022-05-07 DIAGNOSIS — E113299 Type 2 diabetes mellitus with mild nonproliferative diabetic retinopathy without macular edema, unspecified eye: Secondary | ICD-10-CM

## 2022-05-07 DIAGNOSIS — Z6831 Body mass index (BMI) 31.0-31.9, adult: Secondary | ICD-10-CM

## 2022-05-07 DIAGNOSIS — Z933 Colostomy status: Secondary | ICD-10-CM

## 2022-05-07 DIAGNOSIS — R079 Chest pain, unspecified: Secondary | ICD-10-CM | POA: Diagnosis not present

## 2022-05-07 DIAGNOSIS — E119 Type 2 diabetes mellitus without complications: Secondary | ICD-10-CM | POA: Diagnosis present

## 2022-05-07 DIAGNOSIS — K56609 Unspecified intestinal obstruction, unspecified as to partial versus complete obstruction: Secondary | ICD-10-CM | POA: Diagnosis not present

## 2022-05-07 DIAGNOSIS — E669 Obesity, unspecified: Secondary | ICD-10-CM | POA: Diagnosis not present

## 2022-05-07 DIAGNOSIS — Z85048 Personal history of other malignant neoplasm of rectum, rectosigmoid junction, and anus: Secondary | ICD-10-CM

## 2022-05-07 DIAGNOSIS — I2510 Atherosclerotic heart disease of native coronary artery without angina pectoris: Secondary | ICD-10-CM | POA: Diagnosis not present

## 2022-05-07 DIAGNOSIS — Z923 Personal history of irradiation: Secondary | ICD-10-CM

## 2022-05-07 DIAGNOSIS — K2100 Gastro-esophageal reflux disease with esophagitis, without bleeding: Secondary | ICD-10-CM | POA: Diagnosis not present

## 2022-05-07 DIAGNOSIS — R0602 Shortness of breath: Secondary | ICD-10-CM | POA: Diagnosis not present

## 2022-05-07 DIAGNOSIS — Z7984 Long term (current) use of oral hypoglycemic drugs: Secondary | ICD-10-CM | POA: Diagnosis not present

## 2022-05-07 DIAGNOSIS — K219 Gastro-esophageal reflux disease without esophagitis: Secondary | ICD-10-CM | POA: Diagnosis not present

## 2022-05-07 DIAGNOSIS — M2578 Osteophyte, vertebrae: Secondary | ICD-10-CM | POA: Diagnosis not present

## 2022-05-07 DIAGNOSIS — I493 Ventricular premature depolarization: Secondary | ICD-10-CM | POA: Diagnosis not present

## 2022-05-07 DIAGNOSIS — R109 Unspecified abdominal pain: Secondary | ICD-10-CM | POA: Diagnosis not present

## 2022-05-07 DIAGNOSIS — K6389 Other specified diseases of intestine: Secondary | ICD-10-CM | POA: Diagnosis not present

## 2022-05-07 DIAGNOSIS — Z9221 Personal history of antineoplastic chemotherapy: Secondary | ICD-10-CM | POA: Diagnosis not present

## 2022-05-07 DIAGNOSIS — Z79899 Other long term (current) drug therapy: Secondary | ICD-10-CM | POA: Diagnosis not present

## 2022-05-07 DIAGNOSIS — R1084 Generalized abdominal pain: Secondary | ICD-10-CM | POA: Diagnosis not present

## 2022-05-07 DIAGNOSIS — I10 Essential (primary) hypertension: Secondary | ICD-10-CM | POA: Diagnosis present

## 2022-05-07 DIAGNOSIS — E785 Hyperlipidemia, unspecified: Secondary | ICD-10-CM | POA: Diagnosis present

## 2022-05-07 DIAGNOSIS — Z794 Long term (current) use of insulin: Secondary | ICD-10-CM

## 2022-05-07 DIAGNOSIS — K56600 Partial intestinal obstruction, unspecified as to cause: Principal | ICD-10-CM | POA: Diagnosis present

## 2022-05-07 DIAGNOSIS — Z8249 Family history of ischemic heart disease and other diseases of the circulatory system: Secondary | ICD-10-CM | POA: Diagnosis not present

## 2022-05-07 DIAGNOSIS — Z833 Family history of diabetes mellitus: Secondary | ICD-10-CM

## 2022-05-07 DIAGNOSIS — E860 Dehydration: Secondary | ICD-10-CM | POA: Diagnosis not present

## 2022-05-07 DIAGNOSIS — K5669 Other partial intestinal obstruction: Secondary | ICD-10-CM | POA: Diagnosis not present

## 2022-05-07 DIAGNOSIS — C20 Malignant neoplasm of rectum: Secondary | ICD-10-CM | POA: Diagnosis present

## 2022-05-07 DIAGNOSIS — Z4682 Encounter for fitting and adjustment of non-vascular catheter: Secondary | ICD-10-CM | POA: Diagnosis not present

## 2022-05-07 LAB — COMPREHENSIVE METABOLIC PANEL
ALT: 23 U/L (ref 0–44)
AST: 25 U/L (ref 15–41)
Albumin: 3.8 g/dL (ref 3.5–5.0)
Alkaline Phosphatase: 78 U/L (ref 38–126)
Anion gap: 9 (ref 5–15)
BUN: 13 mg/dL (ref 8–23)
CO2: 23 mmol/L (ref 22–32)
Calcium: 9.6 mg/dL (ref 8.9–10.3)
Chloride: 102 mmol/L (ref 98–111)
Creatinine, Ser: 0.85 mg/dL (ref 0.61–1.24)
GFR, Estimated: >60 mL/min (ref >60)
Glucose, Bld: 205 mg/dL — ABNORMAL HIGH (ref 70–99)
Potassium: 5.3 mmol/L — ABNORMAL HIGH (ref 3.5–5.1)
Sodium: 134 mmol/L — ABNORMAL LOW (ref 135–145)
Total Bilirubin: 0.7 mg/dL (ref 0.3–1.2)
Total Protein: 7.3 g/dL (ref 6.5–8.1)

## 2022-05-07 LAB — LIPASE, BLOOD: Lipase: 26 U/L (ref 11–51)

## 2022-05-07 LAB — HEMOGLOBIN A1C
Hgb A1c MFr Bld: 6.6 % — ABNORMAL HIGH (ref 4.8–5.6)
Mean Plasma Glucose: 142.72 mg/dL

## 2022-05-07 LAB — CBC WITH DIFFERENTIAL/PLATELET
Abs Immature Granulocytes: 0.03 10*3/uL (ref 0.00–0.07)
Basophils Absolute: 0.1 10*3/uL (ref 0.0–0.1)
Basophils Relative: 1 %
Eosinophils Absolute: 0.1 10*3/uL (ref 0.0–0.5)
Eosinophils Relative: 1 %
HCT: 52.5 % — ABNORMAL HIGH (ref 39.0–52.0)
Hemoglobin: 17.3 g/dL — ABNORMAL HIGH (ref 13.0–17.0)
Immature Granulocytes: 0 %
Lymphocytes Relative: 18 %
Lymphs Abs: 2 10*3/uL (ref 0.7–4.0)
MCH: 26 pg (ref 26.0–34.0)
MCHC: 33 g/dL (ref 30.0–36.0)
MCV: 78.9 fL — ABNORMAL LOW (ref 80.0–100.0)
Monocytes Absolute: 0.4 10*3/uL (ref 0.1–1.0)
Monocytes Relative: 4 %
Neutro Abs: 8.4 10*3/uL — ABNORMAL HIGH (ref 1.7–7.7)
Neutrophils Relative %: 76 %
Platelets: 247 10*3/uL (ref 150–400)
RBC: 6.65 MIL/uL — ABNORMAL HIGH (ref 4.22–5.81)
RDW: 17.3 % — ABNORMAL HIGH (ref 11.5–15.5)
WBC: 10.9 10*3/uL — ABNORMAL HIGH (ref 4.0–10.5)
nRBC: 0 % (ref 0.0–0.2)

## 2022-05-07 LAB — URINALYSIS, ROUTINE W REFLEX MICROSCOPIC
Bacteria, UA: NONE SEEN
Bilirubin Urine: NEGATIVE
Glucose, UA: >=500 mg/dL — AB
Hgb urine dipstick: NEGATIVE
Ketones, ur: 5 mg/dL — AB
Leukocytes,Ua: NEGATIVE
Nitrite: NEGATIVE
Protein, ur: 100 mg/dL — AB
Specific Gravity, Urine: 1.024 (ref 1.005–1.030)
pH: 8 (ref 5.0–8.0)

## 2022-05-07 LAB — TROPONIN I (HIGH SENSITIVITY)
Troponin I (High Sensitivity): 4 ng/L
Troponin I (High Sensitivity): 3 ng/L (ref <18)

## 2022-05-07 LAB — HIV ANTIBODY (ROUTINE TESTING W REFLEX): HIV Screen 4th Generation wRfx: NONREACTIVE

## 2022-05-07 LAB — CBG MONITORING, ED: Glucose-Capillary: 184 mg/dL — ABNORMAL HIGH (ref 70–99)

## 2022-05-07 LAB — LACTIC ACID, PLASMA
Lactic Acid, Venous: 1.9 mmol/L (ref 0.5–1.9)
Lactic Acid, Venous: 3 mmol/L (ref 0.5–1.9)
Lactic Acid, Venous: 2.2 mmol/L (ref 0.5–1.9)
Lactic Acid, Venous: 1.9 mmol/L (ref 0.5–1.9)

## 2022-05-07 MED ORDER — HYDROMORPHONE HCL 1 MG/ML IJ SOLN
0.5000 mg | INTRAMUSCULAR | Status: DC | PRN
  Administered 2022-05-07 (×2): 0.5 mg via INTRAVENOUS
  Filled 2022-05-07 (×2): qty 1

## 2022-05-07 MED ORDER — INSULIN GLARGINE-YFGN 100 UNIT/ML ~~LOC~~ SOLN
30.0000 [IU] | Freq: Every day | SUBCUTANEOUS | Status: DC
Start: 2022-05-07 — End: 2022-05-09
  Administered 2022-05-07 – 2022-05-08 (×2): 30 [IU] via SUBCUTANEOUS
  Filled 2022-05-07 (×3): qty 0.3

## 2022-05-07 MED ORDER — INSULIN ASPART 100 UNIT/ML IJ SOLN
0.0000 [IU] | Freq: Three times a day (TID) | INTRAMUSCULAR | Status: DC
  Administered 2022-05-08: 1 [IU] via SUBCUTANEOUS
  Administered 2022-05-08 – 2022-05-09 (×3): 2 [IU] via SUBCUTANEOUS

## 2022-05-07 MED ORDER — METRONIDAZOLE 500 MG/100ML IV SOLN
500.0000 mg | Freq: Three times a day (TID) | INTRAVENOUS | Status: DC
  Administered 2022-05-07: 500 mg via INTRAVENOUS
  Filled 2022-05-07: qty 100

## 2022-05-07 MED ORDER — LACTATED RINGERS IV BOLUS
1000.0000 mL | Freq: Once | INTRAVENOUS | Status: AC
  Administered 2022-05-07: 1000 mL via INTRAVENOUS

## 2022-05-07 MED ORDER — ONDANSETRON HCL 4 MG/2ML IJ SOLN
4.0000 mg | Freq: Four times a day (QID) | INTRAMUSCULAR | Status: DC | PRN
  Administered 2022-05-07 (×2): 4 mg via INTRAVENOUS
  Filled 2022-05-07 (×3): qty 2

## 2022-05-07 MED ORDER — IOHEXOL 300 MG/ML  SOLN
100.0000 mL | Freq: Once | INTRAMUSCULAR | Status: AC | PRN
  Administered 2022-05-07: 100 mL via INTRAVENOUS

## 2022-05-07 MED ORDER — SODIUM CHLORIDE 0.9 % IV BOLUS
1000.0000 mL | Freq: Once | INTRAVENOUS | Status: AC
  Administered 2022-05-07: 1000 mL via INTRAVENOUS

## 2022-05-07 MED ORDER — HYDRALAZINE HCL 20 MG/ML IJ SOLN: 10.0000 mg | Freq: Three times a day (TID) | INTRAMUSCULAR | Status: DC | PRN

## 2022-05-07 MED ORDER — ONDANSETRON HCL 4 MG/2ML IJ SOLN
4.0000 mg | Freq: Once | INTRAMUSCULAR | Status: AC
  Administered 2022-05-07: 4 mg via INTRAVENOUS
  Filled 2022-05-07: qty 2

## 2022-05-07 MED ORDER — METRONIDAZOLE 500 MG/100ML IV SOLN
500.0000 mg | Freq: Two times a day (BID) | INTRAVENOUS | Status: DC
  Administered 2022-05-08 – 2022-05-09 (×3): 500 mg via INTRAVENOUS
  Filled 2022-05-07 (×3): qty 100

## 2022-05-07 MED ORDER — MORPHINE SULFATE (PF) 2 MG/ML IV SOLN
2.0000 mg | INTRAVENOUS | Status: DC | PRN
  Administered 2022-05-07 – 2022-05-08 (×3): 2 mg via INTRAVENOUS
  Filled 2022-05-07 (×3): qty 1

## 2022-05-07 MED ORDER — DIATRIZOATE MEGLUMINE & SODIUM 66-10 % PO SOLN
90.0000 mL | Freq: Once | ORAL | Status: AC
  Administered 2022-05-07: 90 mL via NASOGASTRIC
  Filled 2022-05-07: qty 90

## 2022-05-07 MED ORDER — LACTATED RINGERS IV SOLN: INTRAVENOUS | Status: AC

## 2022-05-07 MED ORDER — PANTOPRAZOLE SODIUM 40 MG IV SOLR
40.0000 mg | INTRAVENOUS | Status: DC
  Administered 2022-05-07 – 2022-05-08 (×2): 40 mg via INTRAVENOUS
  Filled 2022-05-07 (×2): qty 10

## 2022-05-07 MED ORDER — MORPHINE SULFATE (PF) 4 MG/ML IV SOLN
4.0000 mg | Freq: Once | INTRAVENOUS | Status: AC
  Administered 2022-05-07: 4 mg via INTRAVENOUS
  Filled 2022-05-07: qty 1

## 2022-05-07 MED ORDER — MORPHINE SULFATE (PF) 2 MG/ML IV SOLN: 2.0000 mg | INTRAVENOUS | Status: DC | PRN

## 2022-05-07 NOTE — Assessment & Plan Note (Signed)
Change oral PPI to IV  ?

## 2022-05-07 NOTE — ED Provider Notes (Signed)
?Phillip Tate ?Provider Note ? ? ?CSN: [id number] ?Arrival date & time: 05/07/22  1304 ? ?  ? ?History ? ?Chief Complaint  ?Patient presents with  ? Abdominal Pain  ? Nausea  ? ? ?Phillip Tate is a 66 y.o. male. ? ?The history is provided by the patient, medical records and a relative. No language interpreter was used.  ?Abdominal Pain ?Pain location:  Generalized ?Pain quality: aching and cramping   ?Pain radiates to:  Does not radiate ?Pain severity:  Severe ?Onset quality:  Gradual ?Duration:  2 days ?Timing:  Constant ?Progression:  Worsening ?Chronicity:  Recurrent ?Context: not trauma   ?Relieved by:  Nothing ?Worsened by:  Nothing ?Ineffective treatments:  None tried ?Associated symptoms: chills, constipation, fatigue, nausea and vomiting   ?Associated symptoms: no chest pain, no cough, no diarrhea, no dysuria, no fever, no flatus and no shortness of breath   ?Associated symptoms comment:  Decreased urine ? ?  ? ?Home Medications ?Prior to Admission medications   ?Medication Sig Start Date End Date Taking? Authorizing Provider  ?AMBULATORY NON FORMULARY MEDICATION Medication Name: Nitroglycerin 0.125% gel. Apply pea size amount to the rectum three times a day for 6-8 weeks 11/28/20   Thornton Park, MD  ?amLODipine (NORVASC) 10 MG tablet Take 10 mg by mouth every morning.     [provider]  ?amoxicillin-clavulanate (AUGMENTIN) 875-125 MG tablet Take 1 tablet by mouth 2 (two) times daily.    [provider]  ?Cholecalciferol (VITAMIN D3) 50 MCG (2000 UT) capsule 1 capsule    [provider]  ?colestipol (COLESTID) 1 g tablet Take 1 tablet (1 g total) by mouth 2 (two) times daily. 03/12/21   Thornton Park, MD  ?dicyclomine (BENTYL) 20 MG tablet Take 20 mg by mouth every 6 (six) hours. As needed    [provider]  ?empagliflozin (JARDIANCE) 25 MG TABS tablet Take 25 mg by mouth daily.    [provider]  ?Ferrous  Sulfate (SLOW FE) 142 (45 Fe) MG TBCR 1 tablet    [provider]  ?fish oil-omega-3 fatty acids 1000 MG capsule Take 1,500 mg by mouth daily.    [provider]  ?fluocinonide cream (LIDEX) 0.05 % See admin instructions. 03/23/17   [provider]  ?gabapentin (NEURONTIN) 300 MG capsule Take 300 mg by mouth 3 (three) times daily.  08/02/16   [provider]  ?ibuprofen (ADVIL,MOTRIN) 200 MG tablet Take 200 mg by mouth every 6 (six) hours as needed for fever or moderate pain.    [provider]  ?insulin glargine (LANTUS) 100 UNIT/ML injection Inject 40-42 Units into the skin at bedtime.     [provider]  ?insulin lispro (HUMALOG) 100 UNIT/ML injection Inject 16-40 Units into the skin 3 (three) times daily before meals. Sliding scan  16units am, 25-30units lunch. 35-40units supper    [provider]  ?INVOKANA 300 MG TABS tablet Take 300 mg by mouth daily before breakfast. 07/10/16   [provider]  ?ipratropium (ATROVENT) 0.06 % nasal spray 2 sprays in each nostril    [provider]  ?labetalol (NORMODYNE) 300 MG tablet Take 300 mg by mouth 2 (two) times daily.    [provider]  ?lactulose (CEPHULAC) 10 g packet Take 10 g by mouth every other day.    [provider]  ?lisinopril (PRINIVIL,ZESTRIL) 40 MG tablet Take 40 mg by mouth every evening.     [provider]  ?  mesalamine (LIALDA) 1.2 g EC tablet Take 2 tablets (2.4 g total) by mouth daily with breakfast. 09/24/21   Thornton Park, MD  ?metFORMIN (GLUCOPHAGE) 1000 MG tablet Take 1,000 mg by mouth 2 (two) times daily with a meal.    [provider]  ?Multiple Vitamin (MULTIVITAMIN) tablet Take 1 tablet by mouth daily.    [provider]  ?NOVOFINE 30G X 8 MM MISC Inject 1 packet into the skin as needed. BLOOD SUGARS 07/10/16   [provider]  ?NOVOLOG FLEXPEN 100 UNIT/ML FlexPen Inject 90 Units into the skin 3 (three) times  daily with meals.  07/18/16   [provider]  ?pantoprazole (PROTONIX) 40 MG tablet Take 1 tablet (40 mg total) by mouth 2 (two) times daily. 07/04/21   Thornton Park, MD  ?polyvinyl alcohol (LIQUIFILM TEARS) 1.4 % ophthalmic solution Place 1 drop into both eyes as needed for dry eyes.    [provider]  ?rosuvastatin (CRESTOR) 10 MG tablet Take 1 tablet (10 mg total) by mouth daily. 09/18/16   Jettie Booze, MD  ?SYNJARDY XR 25-1000 MG TB24  04/10/21   [provider]  ?VALACYCLOVIR HCL PO Take 1 mg by mouth daily. 10/05/15   [provider]  ?zolpidem (AMBIEN) 10 MG tablet Take 10 mg by mouth at bedtime. 10/01/15   [provider]  ?   ? ?Allergies    ?Cardizem cd [diltiazem hcl er beads], Toprol xl [metoprolol], and Tricor [fenofibrate]   ? ?Review of Systems   ?Review of Systems  ?Constitutional:  Positive for chills, diaphoresis and fatigue. Negative for fever.  ?HENT:  Negative for congestion.   ?Respiratory:  Negative for cough, chest tightness, shortness of breath and wheezing.   ?Cardiovascular:  Negative for chest pain and palpitations.  ?Gastrointestinal:  Positive for abdominal distention, abdominal pain, constipation, nausea and vomiting. Negative for diarrhea and flatus.  ?Genitourinary:  Positive for decreased urine volume. Negative for dysuria.  ?Musculoskeletal:  Positive for back pain. Negative for neck pain and neck stiffness.  ?Skin:  Negative for rash.  ?Neurological:  Negative for headaches.  ?Psychiatric/Behavioral:  Negative for agitation and confusion.   ? ?Physical Exam ?Updated Vital Signs ?BP (!) 141/102   Pulse 88   Temp 97.7 ?F (36.5 ?C) (Oral)   Resp 16   SpO2 98%  ?Physical Exam ?Vitals and nursing note reviewed.  ?Constitutional:   ?   General: He is not in acute distress. ?   Appearance: He is well-developed. He is not ill-appearing, toxic-appearing or diaphoretic.  ?HENT:  ?   Head: Normocephalic and atraumatic.  ?    Mouth/Throat:  ?   Mouth: Mucous membranes are moist.  ?Eyes:  ?   Extraocular Movements: Extraocular movements intact.  ?   Conjunctiva/sclera: Conjunctivae normal.  ?Cardiovascular:  ?   Rate and Rhythm: Normal rate and regular rhythm.  ?   Heart sounds: No murmur heard. ?Pulmonary:  ?   Effort: Pulmonary effort is normal. No respiratory distress.  ?   Breath sounds: Normal breath sounds. No wheezing, rhonchi or rales.  ?Chest:  ?   Chest wall: No tenderness.  ?Abdominal:  ?   General: Bowel sounds are decreased. There is distension.  ?   Palpations: Abdomen is soft.  ?   Tenderness: There is abdominal tenderness. There is no right CVA tenderness, left CVA tenderness, guarding or rebound.  ?Musculoskeletal:     ?   General: No swelling.  ?   Cervical  back: Neck supple.  ?Skin: ?   General: Skin is warm and dry.  ?   Capillary Refill: Capillary refill takes less than 2 seconds.  ?Neurological:  ?   Mental Status: He is alert.  ?Psychiatric:     ?   Mood and Affect: Mood normal.  ? ? ?ED Results / Procedures / Treatments   ?Labs ?(all labs ordered are listed, but only abnormal results are displayed) ?Labs Reviewed  ?CBC WITH DIFFERENTIAL/PLATELET - Abnormal; Notable for the following components:  ?    Result Value  ? WBC 10.9 (*)   ? RBC 6.65 (*)   ? Hemoglobin 17.3 (*)   ? HCT 52.5 (*)   ? MCV 78.9 (*)   ? RDW 17.3 (*)   ? Neutro Abs 8.4 (*)   ? All other components within normal limits  ?COMPREHENSIVE METABOLIC PANEL - Abnormal; Notable for the following components:  ? Sodium 134 (*)   ? Potassium 5.3 (*)   ? Glucose, Bld 205 (*)   ? All other components within normal limits  ?URINALYSIS, ROUTINE W REFLEX MICROSCOPIC - Abnormal; Notable for the following components:  ? Glucose, UA >=500 (*)   ? Ketones, ur 5 (*)   ? Protein, ur 100 (*)   ? All other components within normal limits  ?LIPASE, BLOOD  ?LACTIC ACID, PLASMA  ?LACTIC ACID, PLASMA  ?TROPONIN I (HIGH SENSITIVITY)  ?TROPONIN I (HIGH SENSITIVITY)   ? ? ?EKG ?EKG Interpretation ? ?Date/Time:  Wednesday May 07 2022 13:12:25 EDT ?Ventricular Rate:  87 ?PR Interval:  144 ?QRS Duration: 96 ?QT Interval:  400 ?QTC Calculation: 481 ?R Axis:   114 ?Text Interpretation: Sinus rh

## 2022-05-07 NOTE — H&P (Signed)
?History and Physical  ? ? ?Patient: Phillip Tate CLE:[id number] DOB: September 04, 1956 ?DOA: 05/07/2022 ?DOS: the patient was seen and examined on 05/07/2022 ?PCP: Wenda Low, MD  ?Patient coming from: Home - lives with his wife  ? ? ?Chief Complaint: abdominal pain and nausea  ? ?HPI: Phillip Tate is a 66 y.o. male with medical history significant of CAD, T2DM, HLD, hx of rectal cancer in 2006 s/p chemo and colon resection, HTN who presented with severe abdominal pain that started last night around 8pm with associated nausea. Pain rated as a 11/10 and was generalized pain. Radiated around to his lower back an chest. Pain described as cramping in nature. Pain would come and go. Nothing made it better or worse. He tried pepto bismol, lactose. He had nausea, no vomiting. His last BM was yesterday afternoon and it was very soft. No blood. He has had no output in his ostomy since yesterday afternoon. He has had no gas. No fever/chills. He has not had anything to eat or drink since last night.  ? ?Hx ventral hernia surgery in 2007.  ? ?History of rectal cancer, presumed stage 3, diagnosed in 2006. S/p intersphincteric prostatectomy with colonic J pouch to anal anastomosis and diverting ileostomy s/p reversal in 2007. S/p chemo and radiation. He states in 11/2021 he had laparoscopic creation of divided loop colostomy. He had colostomy due to pain with BM and accidents.  ? ?In 03/2022 he had removal of excess rectal lining via anus hemorrhoid.  ? ?He is currently on flagyl '500mg'$  TID for rectal infection from recent surgery.  ? ? ? Denies any fever/chills, vision changes/headaches, chest pain or palpitations, shortness of breath or cough, dysuria or leg swelling.  ? ? ?ER Course:  vitals: afebrile, bp: 141/102, HR: 41, RR: 16, oxygen: 94%RA ?Pertinent labs: wbc: 10.9, hgb: 17.3 ?CXR: stable, no acute finding  ?CT abdomen/pelvis: partial SBO pattern. SBO etiology suspected at enteric anastomosis in the right  upper quadrant. Left lower quadrant colostomy without complication.  ?In ED: general surgery consulted. 2L IVF bolus given, morphine and zofran.  ? ? ?Review of Systems: As mentioned in the history of present illness. All other systems reviewed and are negative. ?Past Medical History:  ?Diagnosis Date  ? Coronary artery disease   ? DDD (degenerative disc disease), lumbar   ? Diabetes mellitus without complication ([place])   ? Dyslipidemia   ? History of rectal cancer   ? dx 2006 --  Stage III, T3  N1  M0,  s/p  chemoradiation (07-10-2005 to 09-05-2005) and anterior colon resection 11-04-2006  ? History of shingles   ? Hyperlipidemia   ? Hypertension   ? Iron deficiency anemia   ? intolerent PO iron--  gets IV iron,  last one 03-12-2015 ([facility])  ? Organic impotence   ? Rectal cancer ([place])   ? adenocarcinoma stage lll  ? ?Past Surgical History:  ?Procedure Laterality Date  ? ANAL RECTAL MANOMETRY N/A 09/04/2021  ? Procedure: ANO RECTAL MANOMETRY;  Surgeon: Mauri Pole, MD;  Location: [facility];  Service: Endoscopy;  Laterality: N/A;  ? ANTERIOR COLON RESECTION W/ COLOSTOMY  11-04-2006  ? CATARACT EXTRACTION W/ INTRAOCULAR LENS IMPLANT Right sept 2015  &  feb 2016  ? COLONOSCOPY WITH ESOPHAGOGASTRODUODENOSCOPY (EGD)  last one 03-07-2010  ? COLONOSCOPY WITH PROPOFOL N/A 10/23/2015  ? Procedure: COLONOSCOPY WITH PROPOFOL;  Surgeon: Garlan Fair, MD;  Location: [facility];  Service: Endoscopy;  Laterality: N/A;  ? COLOSTOMY TAKEDOWN  june 2007  ? EXAMINATION UNDER ANESTHESIA N/A 04/23/2015  ? Procedure: EXAM UNDER ANESTHESIA, MANIPULATION OF PENILE PROSTHESIS PUMP;  Surgeon: Kathie Rhodes, MD;  Location: [facility];  Service: Urology;  Laterality: N/A;  ? PENILE PROSTHESIS PLACEMENT  10-29-2009  ? PORT A CATH PLACEMENT  07-23-2005  ? prolapsed hemorrhoid    ? ?Social History:  reports that he has never smoked. He has never used smokeless tobacco. He reports current alcohol use. He reports that he  does not use drugs. ? ?Allergies  ?Allergen Reactions  ? Cardizem Cd [Diltiazem Hcl Er Beads] Swelling  ? Toprol Xl [Metoprolol] Other (See Comments)  ?  headache  ? Tricor [Fenofibrate] Other (See Comments)  ?  Constipation ?  ? ? ?Family History  ?Problem Relation Age of Onset  ? Hypertension Father   ? Diabetes Mother   ? Heart attack Neg Hx   ? Colon cancer Neg Hx   ? Esophageal cancer Neg Hx   ? Rectal cancer Neg Hx   ? Stomach cancer Neg Hx   ? ? ?Prior to Admission medications   ?Medication Sig Start Date End Date Taking? Authorizing Provider  ?AMBULATORY NON FORMULARY MEDICATION Medication Name: Nitroglycerin 0.125% gel. Apply pea size amount to the rectum three times a day for 6-8 weeks 11/28/20   Thornton Park, MD  ?amLODipine (NORVASC) 10 MG tablet Take 10 mg by mouth every morning.     [provider]  ?amoxicillin-clavulanate (AUGMENTIN) 875-125 MG tablet Take 1 tablet by mouth 2 (two) times daily.    [provider]  ?Cholecalciferol (VITAMIN D3) 50 MCG (2000 UT) capsule 1 capsule    [provider]  ?colestipol (COLESTID) 1 g tablet Take 1 tablet (1 g total) by mouth 2 (two) times daily. 03/12/21   Thornton Park, MD  ?dicyclomine (BENTYL) 20 MG tablet Take 20 mg by mouth every 6 (six) hours. As needed    [provider]  ?empagliflozin (JARDIANCE) 25 MG TABS tablet Take 25 mg by mouth daily.    [provider]  ?Ferrous Sulfate (SLOW FE) 142 (45 Fe) MG TBCR 1 tablet    [provider]  ?fish oil-omega-3 fatty acids 1000 MG capsule Take 1,500 mg by mouth daily.    [provider]  ?fluocinonide cream (LIDEX) 0.05 % See admin instructions. 03/23/17   [provider]  ?gabapentin (NEURONTIN) 300 MG capsule Take 300 mg by mouth 3 (three) times daily.  08/02/16   [provider]  ?ibuprofen (ADVIL,MOTRIN) 200 MG tablet Take 200 mg by mouth every 6 (six) hours as needed for fever or moderate pain.    [provider]   ?insulin glargine (LANTUS) 100 UNIT/ML injection Inject 40-42 Units into the skin at bedtime.     [provider]  ?insulin lispro (HUMALOG) 100 UNIT/ML injection Inject 16-40 Units into the skin 3 (three) times daily before meals. Sliding scan  16units am, 25-30units lunch. 35-40units supper    [provider]  ?INVOKANA 300 MG TABS tablet Take 300 mg by mouth daily before breakfast. 07/10/16   [provider]  ?ipratropium (ATROVENT) 0.06 % nasal spray 2 sprays in each nostril    [provider]  ?labetalol (NORMODYNE) 300 MG tablet Take 300 mg by mouth 2 (two) times daily.    [provider]  ?lactulose (CEPHULAC) 10 g packet Take 10 g by mouth every other day.    [provider]  ?lisinopril (PRINIVIL,ZESTRIL) 40 MG tablet  Take 40 mg by mouth every evening.     [provider]  ?mesalamine (LIALDA) 1.2 g EC tablet Take 2 tablets (2.4 g total) by mouth daily with breakfast. 09/24/21   Thornton Park, MD  ?metFORMIN (GLUCOPHAGE) 1000 MG tablet Take 1,000 mg by mouth 2 (two) times daily with a meal.    [provider]  ?Multiple Vitamin (MULTIVITAMIN) tablet Take 1 tablet by mouth daily.    [provider]  ?NOVOFINE 30G X 8 MM MISC Inject 1 packet into the skin as needed. BLOOD SUGARS 07/10/16   [provider]  ?NOVOLOG FLEXPEN 100 UNIT/ML FlexPen Inject 90 Units into the skin 3 (three) times daily with meals.  07/18/16   [provider]  ?pantoprazole (PROTONIX) 40 MG tablet Take 1 tablet (40 mg total) by mouth 2 (two) times daily. 07/04/21   Thornton Park, MD  ?polyvinyl alcohol (LIQUIFILM TEARS) 1.4 % ophthalmic solution Place 1 drop into both eyes as needed for dry eyes.    [provider]  ?rosuvastatin (CRESTOR) 10 MG tablet Take 1 tablet (10 mg total) by mouth daily. 09/18/16   Jettie Booze, MD  ?SYNJARDY XR 25-1000 MG TB24  04/10/21   [provider]  ?VALACYCLOVIR HCL PO Take 1 mg  by mouth daily. 10/05/15   [provider]  ?zolpidem (AMBIEN) 10 MG tablet Take 10 mg by mouth at bedtime. 10/01/15   [provider]  ? ? ?Physical Exam: ?Vitals:  ? 05/07/22 1622 05/

## 2022-05-07 NOTE — Assessment & Plan Note (Signed)
Hold oral anti hypertensive, prn IV hydralazine.  ?

## 2022-05-07 NOTE — Consult Note (Signed)
Reason for Consult:bowel obstruction ?Referring Physician: Dr Alvino Chapel ? ?Phillip Tate is an 66 y.o. male.  ?HPI: 84 yom with history of LAR and coloanal j pouch in early 2000s for rectal cancer (remains NED), he had diverting loop ileostomy reversed. He then had issues with incontinence and mucous soilage.  He has had a couple bowel obstructions in past.  In 2022 he states about 5 months ago he did undergo hand assist lap descending colostomy in [place], also more recently has had mucosectomy at [facility].  He comes in today with ab pain since last night not getting better assocaited with nausea, no emesis.  No output in bag.  He really doesn't have pain right now since arriving here. He had ct scan which shows likely sbo, no free fluid.  Wbc up a little as is hb. Lactate initially normal but now 3- I think mostly related to dehydration as he has normal vitals.    ?He is scheduled to have cardiac cath at [facility] on Friday for some atypical cp, had exercise test that was negative recently. ? ?Past Medical History:  ?Diagnosis Date  ? Coronary artery disease   ? DDD (degenerative disc disease), lumbar   ? Diabetes mellitus without complication ([place])   ? Dyslipidemia   ? History of rectal cancer   ? dx 2006 --  Stage III, T3  N1  M0,  s/p  chemoradiation (07-10-2005 to 09-05-2005) and anterior colon resection 11-04-2006  ? History of shingles   ? Hyperlipidemia   ? Hypertension   ? Iron deficiency anemia   ? intolerent PO iron--  gets IV iron,  last one 03-12-2015 ([facility])  ? Organic impotence   ? Rectal cancer ([place])   ? adenocarcinoma stage lll  ? ? ?Past Surgical History:  ?Procedure Laterality Date  ? ANAL RECTAL MANOMETRY N/A 09/04/2021  ? Procedure: ANO RECTAL MANOMETRY;  Surgeon: Mauri Pole, MD;  [facility];  Service: Endoscopy;  Laterality: N/A;  ? ANTERIOR COLON RESECTION W/ COLOSTOMY  11-04-2006  ? CATARACT EXTRACTION W/ INTRAOCULAR LENS IMPLANT Right sept 2015  &  feb 2016  ? COLONOSCOPY  WITH ESOPHAGOGASTRODUODENOSCOPY (EGD)  last one 03-07-2010  ? COLONOSCOPY WITH PROPOFOL N/A 10/23/2015  ? Procedure: COLONOSCOPY WITH PROPOFOL;  Surgeon: Garlan Fair, MD;  [facility];  Service: Endoscopy;  Laterality: N/A;  ? COLOSTOMY TAKEDOWN  june 2007  ? EXAMINATION UNDER ANESTHESIA N/A 04/23/2015  ? Procedure: EXAM UNDER ANESTHESIA, MANIPULATION OF PENILE PROSTHESIS PUMP;  Surgeon: Kathie Rhodes, MD;  Location: [facility];  Service: Urology;  Laterality: N/A;  ? PENILE PROSTHESIS PLACEMENT  10-29-2009  ? PORT A CATH PLACEMENT  07-23-2005  ? prolapsed hemorrhoid    ? ? ?Family History  ?Problem Relation Age of Onset  ? Hypertension Father   ? Diabetes Mother   ? Heart attack Neg Hx   ? Colon cancer Neg Hx   ? Esophageal cancer Neg Hx   ? Rectal cancer Neg Hx   ? Stomach cancer Neg Hx   ? ? ?Social History:  reports that he has never smoked. He has never used smokeless tobacco. He reports current alcohol use. He reports that he does not use drugs. ? ?Allergies:  ?Allergies  ?Allergen Reactions  ? Cardizem Cd [Diltiazem Hcl Er Beads] Swelling  ? Toprol Xl [Metoprolol] Other (See Comments)  ?  headache  ? Tricor [Fenofibrate] Other (See Comments)  ?  Constipation ?  ? ? ?Medications: I have  reviewed the patient's current medications. ?No current facility-administered medications on file prior to encounter.  ? ?Current Outpatient Medications on File Prior to Encounter  ?Medication Sig Dispense Refill  ? amLODipine (NORVASC) 10 MG tablet Take 10 mg by mouth daily.    ? empagliflozin (JARDIANCE) 25 MG TABS tablet Take 25 mg by mouth daily.    ? Ferrous Sulfate (IRON PO) Take 1 tablet by mouth daily.    ? gabapentin (NEURONTIN) 300 MG capsule Take 300 mg by mouth 3 (three) times daily.   0  ? insulin glargine (LANTUS) 100 UNIT/ML injection Inject 44 Units into the skin at bedtime.    ? labetalol (NORMODYNE) 300 MG tablet Take 300 mg by mouth 2 (two) times daily.    ? lisinopril  (PRINIVIL,ZESTRIL) 40 MG tablet Take 40 mg by mouth every evening.     ? metFORMIN (GLUCOPHAGE) 850 MG tablet Take 850 mg by mouth 2 (two) times daily with a meal.    ? metronidazole (FLAGYL) 375 MG capsule Take 375 mg by mouth in the morning, at noon, and at bedtime.    ? NOVOLOG FLEXPEN 100 UNIT/ML FlexPen Inject 17-45 Units into the skin See admin instructions. Take 17 to 18 units at breakfast, 25 to 30 units at lunch, and 40 to 45 units at evening  2  ? pantoprazole (PROTONIX) 40 MG tablet Take 1 tablet (40 mg total) by mouth 2 (two) times daily. 180 tablet 3  ? Polyethyl Glycol-Propyl Glycol (SYSTANE FREE OP) Place 1 drop into both eyes in the morning and at bedtime.    ? rosuvastatin (CRESTOR) 10 MG tablet Take 1 tablet (10 mg total) by mouth daily. (Patient taking differently: Take 5 mg by mouth See admin instructions. Take 5 tablets per day per patient. Patient states he splits in half) 90 tablet 3  ? zolpidem (AMBIEN) 10 MG tablet Take 10 mg by mouth at bedtime.    ? AMBULATORY NON FORMULARY MEDICATION Medication Name: Nitroglycerin 0.125% gel. Apply pea size amount to the rectum three times a day for 6-8 weeks 30 g 0  ? amoxicillin-clavulanate (AUGMENTIN) 875-125 MG tablet Take 1 tablet by mouth 2 (two) times daily.    ? colestipol (COLESTID) 1 g tablet Take 1 tablet (1 g total) by mouth 2 (two) times daily. 180 tablet 3  ? dicyclomine (BENTYL) 20 MG tablet Take 20 mg by mouth every 6 (six) hours. As needed    ? Ferrous Sulfate (SLOW FE) 142 (45 Fe) MG TBCR 1 tablet    ? fish oil-omega-3 fatty acids 1000 MG capsule Take 1,500 mg by mouth daily.    ? fluocinonide cream (LIDEX) 0.05 % See admin instructions.    ? ibuprofen (ADVIL,MOTRIN) 200 MG tablet Take 200 mg by mouth every 6 (six) hours as needed for fever or moderate pain.    ? insulin lispro (HUMALOG) 100 UNIT/ML injection Inject 16-40 Units into the skin 3 (three) times daily before meals. Sliding scan  16units am, 25-30units lunch. 35-40units supper     ? INVOKANA 300 MG TABS tablet Take 300 mg by mouth daily before breakfast.  2  ? ipratropium (ATROVENT) 0.06 % nasal spray 2 sprays in each nostril    ? lactulose (CEPHULAC) 10 g packet Take 10 g by mouth every other day.    ? mesalamine (LIALDA) 1.2 g EC tablet Take 2 tablets (2.4 g total) by mouth daily with breakfast. 60 tablet 1  ? Multiple Vitamin (MULTIVITAMIN) tablet Take 1 tablet  by mouth daily.    ? NOVOFINE 30G X 8 MM MISC Inject 1 packet into the skin as needed. BLOOD SUGARS  2  ? polyvinyl alcohol (LIQUIFILM TEARS) 1.4 % ophthalmic solution Place 1 drop into both eyes as needed for dry eyes.    ? SYNJARDY XR 25-1000 MG TB24 Take 1 tablet by mouth daily.    ? VALACYCLOVIR HCL PO Take 1 mg by mouth daily.    ? ? ?Results for orders placed or performed during the hospital encounter of 05/07/22 (from the past 48 hour(s))  ?Urinalysis, Routine w reflex microscopic Urine, Clean Catch     Status: Abnormal  ? Collection Time: 05/07/22  1:44 PM  ?Result Value Ref Range  ? Color, Urine YELLOW YELLOW  ? APPearance CLEAR CLEAR  ? Specific Gravity, Urine 1.024 1.005 - 1.030  ? pH 8.0 5.0 - 8.0  ? Glucose, UA >=500 (A) NEGATIVE mg/dL  ? Hgb urine dipstick NEGATIVE NEGATIVE  ? Bilirubin Urine NEGATIVE NEGATIVE  ? Ketones, ur 5 (A) NEGATIVE mg/dL  ? Protein, ur 100 (A) NEGATIVE mg/dL  ? Nitrite NEGATIVE NEGATIVE  ? Leukocytes,Ua NEGATIVE NEGATIVE  ? RBC / HPF 0-5 0 - 5 RBC/hpf  ? WBC, UA 0-5 0 - 5 WBC/hpf  ? Bacteria, UA NONE SEEN NONE SEEN  ? Squamous Epithelial / LPF 0-5 0 - 5  ? Mucus PRESENT   ?  Comment: Performed at [facility], [place] [address] [place], [place] [address]  ?CBC with Differential     Status: Abnormal  ? Collection Time: 05/07/22  1:49 PM  ?Result Value Ref Range  ? WBC 10.9 (H) 4.0 - 10.5 K/uL  ? RBC 6.65 (H) 4.22 - 5.81 MIL/uL  ? Hemoglobin 17.3 (H) 13.0 - 17.0 g/dL  ? HCT 52.5 (H) 39.0 - 52.0 %  ? MCV 78.9 (L) 80.0 - 100.0 fL  ? MCH 26.0 26.0 - 34.0 pg  ? MCHC 33.0 30.0 - 36.0 g/dL  ?  RDW 17.3 (H) 11.5 - 15.5 %  ? Platelets 247 150 - 400 K/uL  ? nRBC 0.0 0.0 - 0.2 %  ? Neutrophils Relative % 76 %  ? Neutro Abs 8.4 (H) 1.7 - 7.7 K/uL  ? Lymphocytes Relative 18 %  ? Lymphs Abs 2.0 0.7 - 4.0 K/

## 2022-05-07 NOTE — Assessment & Plan Note (Addendum)
No A1C in records, will check today ?Continue home lantus at decreased dose of 30 units while NPO. Hold pre meal insulin while NPO and add on SSI.  ?Hold metformin-jardiance and invokana  ?

## 2022-05-07 NOTE — Assessment & Plan Note (Addendum)
Plans for LHC this Friday in [place] for the frequent PVCs and concern with angina/DOE (discussed likely will have to reschedule)  ?Recent echo wnl.  ?Keep on telemetry  ?Troponin wnl x2  ?

## 2022-05-07 NOTE — Assessment & Plan Note (Addendum)
-   Rectal CA - dx 2006. S/p intersphincteric prostatectomy with colonic J pouch to anal anastomosis and diverting ileostomy s/p reversal in 2007 ?-S/p chemo and radiation ?He states in 11/2021 he had laparoscopic creation of divided loop colostomy. He had colostomy due to pain with BM and accidents.  ?- C/b rectal prolapse requiring surgeries - most recently 04/08/22 - proctoplasty ?Had some puss from surgical site and is currently on flagyl for 10 more days, continue this while inpatient>IV ?-consult for ostomy care  ?

## 2022-05-07 NOTE — ED Triage Notes (Signed)
Pt has colostomy bag. Reports severe abd and back pain since yesterday with nausea, denies emesis. Reports no drainage in bag since yesterday, minimal urine output.  ?

## 2022-05-07 NOTE — ED Provider Triage Note (Signed)
Emergency Medicine Provider Triage Evaluation Note ? ?Mechele Collin , a 66 y.o. male  was evaluated in triage.  Pt complains of abdominal pain the past 2 days.  No output in his colostomy since yesterday.  Denies fever, chills.  Endorses chest pressure earlier today.  States abdominal pain radiates to his back. ? ?Review of Systems  ?Positive: As above ?Negative: As above ? ?Physical Exam  ?BP (!) 141/102   Pulse 88   Temp 97.7 ?F (36.5 ?C) (Oral)   Resp 16   SpO2 98%  ?Gen:   Awake, no distress   ?Resp:  Normal effort  ?MSK:   Moves extremities without difficulty  ?Other:  Colostomy present in the left lower quadrant.  Abdomen distended with diffuse tenderness.  ? ?Medical Decision Making  ?Medically screening exam initiated at 1:44 PM.  Appropriate orders placed.  Tyjon MAMADOU BREON was informed that the remainder of the evaluation will be completed by another provider, this initial triage assessment does not replace that evaluation, and the importance of remaining in the ED until their evaluation is complete. ? ? ?  ?Evlyn Courier, PA-C ?05/07/22 1345 ? ?

## 2022-05-07 NOTE — Assessment & Plan Note (Signed)
Continue crestor when no longer NPO  ?

## 2022-05-07 NOTE — Assessment & Plan Note (Addendum)
67 year old with recurrent SBO presenting with 1 day history of severe abdominal pain and and nausea found to have partial SBO ?-Admit to Med-tele, NPO  ?-no clinical findings for emergent surgery at this time. ?-general surgery consulted and will see ?-he has no vomiting and nausea improved, NG tube per surgery  ?-Gentle IV fluid hydration ?-Pain medication ?-anti emetics with zofran  ?-encourage ambulation ?-Monitor electrolytes ?-initial lactic acid 1.9>3.0. He has no clinical signs of a surgical abdomen and is pain free. I do think more from hypovolemia. Maintenance IVF and will trend lactic acid.  ?-currently on flagyl s/p proctoplasty on 04/20/22. Continue flagyl, trend lactic acid. No fever/complaints of pain from surgery  ? ?

## 2022-05-07 NOTE — ED Provider Notes (Signed)
?  Physical Exam  ?BP (!) 146/85   Pulse 78   Temp 97.7 ?F (36.5 ?C) (Oral)   Resp 18   SpO2 100%  ? ?Physical Exam ? ?Procedures  ?Procedures ? ?ED Course / MDM  ?  ?Medical Decision Making ?Amount and/or Complexity of Data Reviewed ?Labs: ordered. ? ?Risk ?Prescription drug management. ? ? ?Past Medical History:  ?Diagnosis Date  ? Coronary artery disease   ? DDD (degenerative disc disease), lumbar   ? Diabetes mellitus without complication ([place])   ? Dyslipidemia   ? History of rectal cancer   ? dx 2006 --  Stage III, T3  N1  M0,  s/p  chemoradiation (07-10-2005 to 09-05-2005) and anterior colon resection 11-04-2006  ? History of shingles   ? Hyperlipidemia   ? Hypertension   ? Iron deficiency anemia   ? intolerent PO iron--  gets IV iron,  last one 03-12-2015 ([facility])  ? Organic impotence   ? Rectal cancer ([place])   ? adenocarcinoma stage lll  ? ?Past Surgical History:  ?Procedure Laterality Date  ? ANAL RECTAL MANOMETRY N/A 09/04/2021  ? Procedure: ANO RECTAL MANOMETRY;  Surgeon: Mauri Pole, MD;  Location: [facility];  Service: Endoscopy;  Laterality: N/A;  ? ANTERIOR COLON RESECTION W/ COLOSTOMY  11-04-2006  ? CATARACT EXTRACTION W/ INTRAOCULAR LENS IMPLANT Right sept 2015  &  feb 2016  ? COLONOSCOPY WITH ESOPHAGOGASTRODUODENOSCOPY (EGD)  last one 03-07-2010  ? COLONOSCOPY WITH PROPOFOL N/A 10/23/2015  ? Procedure: COLONOSCOPY WITH PROPOFOL;  Surgeon: Garlan Fair, MD;  Location: [facility];  Service: Endoscopy;  Laterality: N/A;  ? COLOSTOMY TAKEDOWN  june 2007  ? EXAMINATION UNDER ANESTHESIA N/A 04/23/2015  ? Procedure: EXAM UNDER ANESTHESIA, MANIPULATION OF PENILE PROSTHESIS PUMP;  Surgeon: Kathie Rhodes, MD;  Location: [facility];  Service: Urology;  Laterality: N/A;  ? PENILE PROSTHESIS PLACEMENT  10-29-2009  ? PORT A CATH PLACEMENT  07-23-2005  ? prolapsed hemorrhoid    ? ?Received patient in signout.  Abdominal pain nausea vomiting.  History of bowel obstructions.  Previous  rectal cancer.  Also had surgeries with colostomy and ileostomy.  Cancer surgery done at [facility].  More of the abdominal surgeries done in [place].  CT scan shows partial bowel obstruction.  Lab work shows dehydration.  Will discuss with general surgery and internal medicine for admission ? ? ? ? ?  ?Davonna Belling, MD ?05/07/22 1645 ? ?

## 2022-05-08 ENCOUNTER — Inpatient Hospital Stay (HOSPITAL_COMMUNITY): Payer: Medicare HMO

## 2022-05-08 DIAGNOSIS — E119 Type 2 diabetes mellitus without complications: Secondary | ICD-10-CM

## 2022-05-08 DIAGNOSIS — I10 Essential (primary) hypertension: Secondary | ICD-10-CM

## 2022-05-08 DIAGNOSIS — K2100 Gastro-esophageal reflux disease with esophagitis, without bleeding: Secondary | ICD-10-CM

## 2022-05-08 DIAGNOSIS — K56609 Unspecified intestinal obstruction, unspecified as to partial versus complete obstruction: Secondary | ICD-10-CM | POA: Diagnosis not present

## 2022-05-08 LAB — BASIC METABOLIC PANEL
Anion gap: 6 (ref 5–15)
BUN: 12 mg/dL (ref 8–23)
CO2: 26 mmol/L (ref 22–32)
Calcium: 8.7 mg/dL — ABNORMAL LOW (ref 8.9–10.3)
Chloride: 106 mmol/L (ref 98–111)
Creatinine, Ser: 0.81 mg/dL (ref 0.61–1.24)
GFR, Estimated: >60 mL/min (ref >60)
Glucose, Bld: 127 mg/dL — ABNORMAL HIGH (ref 70–99)
Potassium: 4.4 mmol/L (ref 3.5–5.1)
Sodium: 138 mmol/L (ref 135–145)

## 2022-05-08 LAB — CBC
HCT: 47.8 % (ref 39.0–52.0)
Hemoglobin: 15.6 g/dL (ref 13.0–17.0)
MCH: 25.6 pg — ABNORMAL LOW (ref 26.0–34.0)
MCHC: 32.6 g/dL (ref 30.0–36.0)
MCV: 78.5 fL — ABNORMAL LOW (ref 80.0–100.0)
Platelets: 243 10*3/uL (ref 150–400)
RBC: 6.09 MIL/uL — ABNORMAL HIGH (ref 4.22–5.81)
RDW: 16.6 % — ABNORMAL HIGH (ref 11.5–15.5)
WBC: 8.2 10*3/uL (ref 4.0–10.5)
nRBC: 0 % (ref 0.0–0.2)

## 2022-05-08 LAB — GLUCOSE, CAPILLARY
Glucose-Capillary: 132 mg/dL — ABNORMAL HIGH (ref 70–99)
Glucose-Capillary: 175 mg/dL — ABNORMAL HIGH (ref 70–99)
Glucose-Capillary: 175 mg/dL — ABNORMAL HIGH (ref 70–99)
Glucose-Capillary: 192 mg/dL — ABNORMAL HIGH (ref 70–99)

## 2022-05-08 LAB — LACTIC ACID, PLASMA: Lactic Acid, Venous: 1.8 mmol/L (ref 0.5–1.9)

## 2022-05-08 MED ORDER — ROSUVASTATIN CALCIUM 5 MG PO TABS
10.0000 mg | ORAL_TABLET | Freq: Every day | ORAL | Status: DC
Start: 2022-05-08 — End: 2022-05-09
  Administered 2022-05-08 – 2022-05-09 (×2): 10 mg via ORAL
  Filled 2022-05-08 (×2): qty 2

## 2022-05-08 MED ORDER — LABETALOL HCL 100 MG PO TABS
300.0000 mg | ORAL_TABLET | Freq: Two times a day (BID) | ORAL | Status: DC
Start: 2022-05-08 — End: 2022-05-09
  Administered 2022-05-08 – 2022-05-09 (×3): 300 mg via ORAL
  Filled 2022-05-08 (×3): qty 3

## 2022-05-08 MED ORDER — GABAPENTIN 300 MG PO CAPS
300.0000 mg | ORAL_CAPSULE | Freq: Three times a day (TID) | ORAL | Status: DC
Start: 2022-05-08 — End: 2022-05-09
  Administered 2022-05-08 – 2022-05-09 (×4): 300 mg via ORAL
  Filled 2022-05-08 (×4): qty 1

## 2022-05-08 MED ORDER — ENOXAPARIN SODIUM 40 MG/0.4ML IJ SOSY
40.0000 mg | PREFILLED_SYRINGE | INTRAMUSCULAR | Status: DC
Start: 2022-05-08 — End: 2022-05-09
  Administered 2022-05-08 – 2022-05-09 (×2): 40 mg via SUBCUTANEOUS
  Filled 2022-05-08 (×2): qty 0.4

## 2022-05-08 NOTE — Consult Note (Addendum)
[facility] Nurse ostomy consult note ?Patient receiving care in [place] ?Patient is independent with ostomy care ?Stoma type/location:  LLQ colostomy and mucus fistula ?Stomal assessment/size:  1 3/8" at last ostomy clinic visit with Santiago Glad.  ?Peristomal assessment: Not assessed ?Treatment options for stomal/peristomal skin:  ?Output: mushy dark brown ?Ostomy pouching: 1pc. Convex. Patient states Eldridge Abrahams does not work, he has leakage issues. Changed to Covatec at recommendation of Wetzel Bjornstad, FNP CWON in the ostomy clinic.   ?Education provided: None ?I will consult with Santiago Glad to see if she has any Covatec that we can take to him. He states that the last pouch change was Tues and he generally changes his pouch every 4 days. He emptied this morning around 5 am and has a good amount of stool in his pouch now. Seems to be putting out very well.  ? ?Thank you for the consult. [facility] nurse will not follow at this time.   ?Please re-consult the [facility] team if needed. ? ?Phillip Tate. Phillip Julian, MSN, RN, CMSRN, AGCNS, WTA ?Wound Treatment Associate ?Pager [phone]   ?

## 2022-05-08 NOTE — Progress Notes (Signed)
Mobility Specialist Progress Note: ? ? 05/08/22 1015  ?Mobility  ?Activity Ambulated with assistance in hallway  ?Level of Assistance Independent after set-up  ?Assistive Device None  ?Distance Ambulated (ft) 570 ft  ?Activity Response Tolerated well  ?$Mobility charge 1 Mobility  ? ?Pt received in bed willing to participate in mobility. No complaints of pain. Left in room with call bell in reach, all needs met and wife present.  ? ?Phillip Tate ?Mobility Specialist ?Primary Phone [phone] ? ?

## 2022-05-08 NOTE — Plan of Care (Signed)
Patient arrived from ED on stretcher. Ambulated to bed without assistance steadily. Patient settled into room. Admission assessment completed. Will continue to monitor according to orders and plan of care. ?

## 2022-05-08 NOTE — Progress Notes (Signed)
? ? ?   ?Subjective: ?Doing great this morning.  Feeling much better.  Has already had to empty his colostomy once and is full again.  Abdominal pain much better ? ?ROS: See above, otherwise other systems negative ? ?Objective: ?Vital signs in last 24 hours: ?Temp:  [97.7 ?F (36.5 ?C)-98.3 ?F (36.8 ?C)] 98.1 ?F (36.7 ?C) (05/11 0820) ?Pulse Rate:  [40-88] 70 (05/11 0820) ?Resp:  [14-20] 18 (05/11 0820) ?BP: (126-164)/(63-102) 126/67 (05/11 0820) ?SpO2:  [94 %-100 %] 96 % (05/11 0820) ?Weight:  [101.1 kg] 101.1 kg (05/11 0159) ?Last BM Date : 05/06/22 ? ?Intake/Output from previous day: ?05/10 0701 - 05/11 0700 ?In: 1093 [I.V.:750; IV Piggyback:1000] ?Out: 500 [Emesis/NG output:500] ?Intake/Output this shift: ?No intake/output data recorded. ? ?PE: ?Abd: soft, NT, ND, +BS, colostomy is full of output.  NGT with minimal watered down output ? ?Lab Results:  ?Recent Labs  ?  05/07/22 ?1349 05/08/22 ?0352  ?WBC 10.9* 8.2  ?HGB 17.3* 15.6  ?HCT 52.5* 47.8  ?PLT 247 243  ? ?BMET ?Recent Labs  ?  05/07/22 ?1349 05/08/22 ?0352  ?NA 134* 138  ?K 5.3* 4.4  ?CL 102 106  ?CO2 23 26  ?GLUCOSE 205* 127*  ?BUN 13 12  ?CREATININE 0.85 0.81  ?CALCIUM 9.6 8.7*  ? ?PT/INR ?No results for input(s): LABPROT, INR in the last 72 hours. ?CMP  ?   ?Component Value Date/Time  ? NA 138 05/08/2022 0352  ? K 4.4 05/08/2022 0352  ? CL 106 05/08/2022 0352  ? CO2 26 05/08/2022 0352  ? GLUCOSE 127 (H) 05/08/2022 0352  ? BUN 12 05/08/2022 0352  ? CREATININE 0.81 05/08/2022 0352  ? CALCIUM 8.7 (L) 05/08/2022 0352  ? PROT 7.3 05/07/2022 1349  ? ALBUMIN 3.8 05/07/2022 1349  ? AST 25 05/07/2022 1349  ? ALT 23 05/07/2022 1349  ? ALKPHOS 78 05/07/2022 1349  ? BILITOT 0.7 05/07/2022 1349  ? GFRNONAA >60 05/08/2022 0352  ? GFRAA  03/03/2010 1455  ?  >60        ?The eGFR has been calculated ?using the MDRD equation. ?This calculation has not been ?validated in all clinical ?situations. ?eGFR's persistently ?<60 mL/min signify ?possible Chronic Kidney  Disease.  ? ?Lipase  ?   ?Component Value Date/Time  ? LIPASE 26 05/07/2022 1349  ? ? ? ? ? ?Studies/Results: ?DG Chest 2 View ? ?Result Date: 05/07/2022 ?CLINICAL DATA:  Shortness of breath with generalized chest and abdominal pain, diaphoresis, weakness and vomiting. History of rectal cancer and diabetes. EXAM: CHEST - 2 VIEW COMPARISON:  Radiographs 02/04/2021.  CT 07/12/2010. FINDINGS: The heart size and mediastinal contours are normal. The lungs are clear. There is no pleural effusion or pneumothorax. No acute osseous findings are identified. Stable degenerative changes in the spine. IMPRESSION: Stable chest.  No evidence of active cardiopulmonary process. Electronically Signed   By: Richardean Sale M.D.   On: 05/07/2022 14:54  ? ?CT ABDOMEN PELVIS W CONTRAST ? ?Result Date: 05/07/2022 ?CLINICAL DATA:  Acute abdominal pain. History of rectal carcinoma and colostomy. Multiple surgeries and colonoscopies. EXAM: CT ABDOMEN AND PELVIS WITH CONTRAST TECHNIQUE: Multidetector CT imaging of the abdomen and pelvis was performed using the standard protocol following bolus administration of intravenous contrast. RADIATION DOSE REDUCTION: This exam was performed according to the departmental dose-optimization program which includes automated exposure control, adjustment of the mA and/or kV according to patient size and/or use of iterative reconstruction technique. CONTRAST:  118m OMNIPAQUE IOHEXOL 300 MG/ML  SOLN  COMPARISON:  CT 02/18/2021 FINDINGS: Lower chest: Lung bases are clear. Hepatobiliary: No focal hepatic lesion.  Gallbladder normal. Pancreas: Pancreas is normal. No ductal dilatation. No pancreatic inflammation. Spleen: Normal spleen Adrenals/urinary tract: Adrenal glands and kidneys are normal. The ureters and bladder normal. Stomach/Bowel: Stomach is normal. Small amount fluid the stomach. The gastric antral region is thickened and collapsed (image 72/6 and image 28/3). No gastric outlet obstruction. Second and  third portion duodenum normal. Proximal small bowel is relatively collapsed. Mid small bowel is dilated and fluid-filled with small air-fluid levels. Small bowel measures up to 3.9 cm (image 791/3). There small bowel small bowel anastomosis in the RIGHT upper quadrant with associated caliber change (image 37/3). There is fecalization of internal contents proximal this caliber change consistent with stasis of enteric contents. Proximal to the transition the small bowel measures 5 cm and distal small bowel measures 1.5 cm. Small bowel is collapsed leading up terminal ileum. Ascending and transverse colon normal. Transverse colon exits the the LEFT abdominal wall colostomy without complication. Distal collapsed descending colon low rectal anastomosis noted. Vascular/Lymphatic: Abdominal aorta is normal caliber. No periportal or retroperitoneal adenopathy. No pelvic adenopathy. Reproductive: Unremarkable penile prosthetic noted Other: No intraperitoneal free air. No pneumatosis or portal venous gas. Musculoskeletal: No aggressive osseous lesion. IMPRESSION: 1. Partial small bowel obstructive pattern which is increased in obstructive characteristics compared to CT 02/18/2022. Small bowel obstruction etiology is suspected at enteric enteric anastomosis in the RIGHT upper quadrant. 2. LEFT lower quadrant colostomy without complication. Electronically Signed   By: Suzy Bouchard M.D.   On: 05/07/2022 16:24  ? ?DG Chest Portable 1 View ? ?Result Date: 05/07/2022 ?CLINICAL DATA:  Nasogastric tube placement EXAM: PORTABLE CHEST 1 VIEW COMPARISON:  05/07/2022 FINDINGS: Nasogastric tube extends into the upper abdomen beyond the margin of the examination. Lungs are clear. No pneumothorax or pleural effusion. Cardiac size within normal limits. Pulmonary vascularity is normal. IMPRESSION: Nasogastric tube extends into the upper abdomen, beyond the margin of the examination. Electronically Signed   By: Fidela Salisbury M.D.   On:  05/07/2022 22:22  ? ?DG Abd Portable 1V-Small Bowel Protocol-Position Verification ? ?Result Date: 05/07/2022 ?CLINICAL DATA:  NG tube placement EXAM: PORTABLE ABDOMEN - 1 VIEW COMPARISON:  05/07/2022 FINDINGS: Esophageal tube tip overlies proximal stomach. Side-port probably just beyond GE junction. IMPRESSION: Esophageal tube tip overlies proximal stomach Electronically Signed   By: Donavan Foil M.D.   On: 05/07/2022 22:21  ? ?DG Abd Portable 1V-Small Bowel Obstruction Protocol-initial, 8 hr delay ? ?Result Date: 05/08/2022 ?CLINICAL DATA:  Small bowel obstruction. EXAM: PORTABLE ABDOMEN - 1 VIEW COMPARISON:  Upper abdominal radiograph 05/07/2022; CT abdomen and pelvis 05/07/2022 FINDINGS: There are dilated loops of small bowel measuring up to 4.3 cm. Enteric tube tip overlies the stomach and left upper quadrant. Oral contrast is seen within the ascending and transverse colon. No portal venous gas or pneumatosis is seen within the portion of the liver that is imaged. Evaluation for pneumoperitoneum is limited by portable supine technique, however none is identified. Mild L1-2 disc space narrowing and peripheral endplate osteophytes. IMPRESSION:: IMPRESSION: 1. There are again dilated loops of small bowel consistent with the partial small bowel obstruction at enteric enteric anastomosis seen on yesterday's CT. 2. Nasogastric tube in appropriate position. Electronically Signed   By: Yvonne Kendall M.D.   On: 05/08/2022 08:11   ? ?Anti-infectives: ?Anti-infectives (From admission, onward)  ? ? Start     Dose/Rate Route Frequency Ordered Stop  ?  05/08/22 0800  metroNIDAZOLE (FLAGYL) IVPB 500 mg       ? 500 mg ?100 mL/hr over 60 Minutes Intravenous Every 12 hours 05/07/22 2023    ? 05/07/22 1830  metroNIDAZOLE (FLAGYL) IVPB 500 mg  Status:  Discontinued       ? 500 mg ?100 mL/hr over 60 Minutes Intravenous Every 8 hours 05/07/22 1818 05/07/22 2023  ? ?  ? ? ? ?Assessment/Plan ?SBO ?-history of multiple abdominal  surgeries ?-xray with contrast in his colon and clinically resolving as well.  X-ray still has some dilated SB loops, but suspect some of this is lag ?-will DC NGT and give CLD today  ? ?FEN - CLD/IVFs per medicine ?VT

## 2022-05-08 NOTE — Progress Notes (Signed)
?      ?                 PROGRESS NOTE ? ?      ?PATIENT DETAILS ?Name: Phillip Tate ?Age: 66 y.o. ?Sex: male ?Date of Birth: 1956/03/17 ?Admit Date: 05/07/2022 ?Admitting Physician Orma Flaming, MD ?[id number], Denton Ar, MD ? ?Brief Summary: ?Patient is a 66 y.o.  male with history of DM-2, HTN, HLD, history of rectal cancer-s/p chemo/radiation/colon resection-ostomy in place-presented with abdominal pain-found to have SBO. ? ?Significant events: ?5/10>> admit for SBO. ? ?Significant studies: ?5/10>>CT abdomen/pelvis: Partial SBO ? ?Significant microbiology data: ? ? ?Procedures: ? ? ?Consults: ?CCS ? ?Subjective: ?Lying comfortably in bed-denies any chest pain or shortness of breath.  Abdominal pain is resolved.  Soft stool in ostomy ? ?Objective: ?Vitals: ?Blood pressure 126/67, pulse 70, temperature 98.1 ?F (36.7 ?C), temperature source Oral, resp. rate 18, height '5\' 10"'$  (1.778 m), weight 101.1 kg, SpO2 96 %.  ? ?Exam: ?Gen Exam:Alert awake-not in any distress ?HEENT:atraumatic, normocephalic ?Chest: B/L clear to auscultation anteriorly ?CVS:S1S2 regular ?Abdomen:soft non tender, non distended ?Extremities:no edema ?Neurology: Non focal ?Skin: no rash ? ?Pertinent Labs/Radiology: ? ?  Latest Ref Rng & Units 05/08/2022  ?  3:52 AM 05/07/2022  ?  1:49 PM 02/18/2022  ?  1:55 PM  ?CBC  ?WBC 4.0 - 10.5 K/uL 8.2   10.9   8.6    ?Hemoglobin 13.0 - 17.0 g/dL 15.6   17.3   14.9    ?Hematocrit 39.0 - 52.0 % 47.8   52.5   45.8    ?Platelets 150 - 400 K/uL 243   247   264    ?  ?Lab Results  ?Component Value Date  ? NA 138 05/08/2022  ? K 4.4 05/08/2022  ? CL 106 05/08/2022  ? CO2 26 05/08/2022  ?  ? ? ?Assessment/Plan: ?Small bowel obstruction: Improved-abdominal pain has resolved-soft stool in ostomy-General surgery planning clear liquid diet and discontinuing NG tube today.  Defer further to CCS.  ? ?HTN: BP slowly creeping up-resume labetalol today-if clinical improvement continues-suspect could resume lisinopril  prior to discharge. ? ?HLD: Resume Crestor.  Patient ? ?DM-2 (A1c 6.6 on 5/10): CBG stable-continue Semglee 30 units daily and SSI. ? ?Recent Labs  ?  05/07/22 ?2140 05/08/22 ?0834  ?GLUCAP 184* 132*  ? ?History of exertional dyspnea: Recently evaluated by [facility] cardiology-he apparently was scheduled for a LHC later this week-but he has called cardiology and has this postponed.  He currently has no anginal symptoms. ? ?GERD: Continue PPI ? ?History of rectal cancer-s/p intersphincteric proctectomy-ostomy in place. ? ?Obesity: ?Estimated body mass index is 31.98 kg/m? as calculated from the following: ?  Height as of this encounter: '5\' 10"'$  (1.778 m). ?  Weight as of this encounter: 101.1 kg.  ? ?Code status: ?  Code Status: Full Code  ? ?DVT Prophylaxis: ?SCDs Start: 05/07/22 1835 ?  ?Family Communication: None at bedside ? ? ?Disposition Plan: ?Status is: Inpatient ?Remains inpatient appropriate because: Resolving SBO-diet being advanced-not yet stable for discharge. ?  ?Planned Discharge Destination:Home ? ? ?Diet: ?Diet Order   ? ?       ?  Diet clear liquid Room service appropriate? Yes; Fluid consistency: Thin  Diet effective now       ?  ? ?  ?  ? ?  ?  ? ? ?Antimicrobial agents: ?Anti-infectives (From admission, onward)  ? ? Start     Dose/Rate Route  Frequency Ordered Stop  ? 05/08/22 0800  metroNIDAZOLE (FLAGYL) IVPB 500 mg       ? 500 mg ?100 mL/hr over 60 Minutes Intravenous Every 12 hours 05/07/22 2023    ? 05/07/22 1830  metroNIDAZOLE (FLAGYL) IVPB 500 mg  Status:  Discontinued       ? 500 mg ?100 mL/hr over 60 Minutes Intravenous Every 8 hours 05/07/22 1818 05/07/22 2023  ? ?  ? ? ? ?MEDICATIONS: ?Scheduled Meds: ? insulin aspart  0-9 Units Subcutaneous TID WC  ? insulin glargine-yfgn  30 Units Subcutaneous QHS  ? pantoprazole (PROTONIX) IV  40 mg Intravenous Q24H  ? ?Continuous Infusions: ? lactated ringers 75 mL/hr at 05/07/22 1914  ? metronidazole 500 mg (05/08/22 0825)  ? ?PRN Meds:.hydrALAZINE,  HYDROmorphone (DILAUDID) injection, morphine injection, ondansetron (ZOFRAN) IV ? ? ?I have personally reviewed following labs and imaging studies ? ?LABORATORY DATA: ?CBC: ?Recent Labs  ?Lab 05/07/22 ?1349 05/08/22 ?0352  ?WBC 10.9* 8.2  ?NEUTROABS 8.4*  --   ?HGB 17.3* 15.6  ?HCT 52.5* 47.8  ?MCV 78.9* 78.5*  ?PLT 247 243  ? ? ?Basic Metabolic Panel: ?Recent Labs  ?Lab 05/07/22 ?1349 05/08/22 ?0352  ?NA 134* 138  ?K 5.3* 4.4  ?CL 102 106  ?CO2 23 26  ?GLUCOSE 205* 127*  ?BUN 13 12  ?CREATININE 0.85 0.81  ?CALCIUM 9.6 8.7*  ? ? ?GFR: ?Estimated Creatinine Clearance: 106.8 mL/min (by C-G formula based on SCr of 0.81 mg/dL). ? ?Liver Function Tests: ?Recent Labs  ?Lab 05/07/22 ?1349  ?AST 25  ?ALT 23  ?ALKPHOS 78  ?BILITOT 0.7  ?PROT 7.3  ?ALBUMIN 3.8  ? ?Recent Labs  ?Lab 05/07/22 ?1349  ?LIPASE 26  ? ?No results for input(s): AMMONIA in the last 168 hours. ? ?Coagulation Profile: ?No results for input(s): INR, PROTIME in the last 168 hours. ? ?Cardiac Enzymes: ?No results for input(s): CKTOTAL, CKMB, CKMBINDEX, TROPONINI in the last 168 hours. ? ?BNP (last 3 results) ?No results for input(s): PROBNP in the last 8760 hours. ? ?Lipid Profile: ?No results for input(s): CHOL, HDL, LDLCALC, TRIG, CHOLHDL, LDLDIRECT in the last 72 hours. ? ?Thyroid Function Tests: ?No results for input(s): TSH, T4TOTAL, FREET4, T3FREE, THYROIDAB in the last 72 hours. ? ?Anemia Panel: ?No results for input(s): VITAMINB12, FOLATE, FERRITIN, TIBC, IRON, RETICCTPCT in the last 72 hours. ? ?Urine analysis: ?   ?Component Value Date/Time  ? COLORURINE YELLOW 05/07/2022 1344  ? APPEARANCEUR CLEAR 05/07/2022 1344  ? LABSPEC 1.024 05/07/2022 1344  ? PHURINE 8.0 05/07/2022 1344  ? GLUCOSEU >=500 (A) 05/07/2022 1344  ? HGBUR NEGATIVE 05/07/2022 1344  ? BILIRUBINUR NEGATIVE 05/07/2022 1344  ? KETONESUR 5 (A) 05/07/2022 1344  ? PROTEINUR 100 (A) 05/07/2022 1344  ? UROBILINOGEN 0.2 10/18/2007 1926  ? NITRITE NEGATIVE 05/07/2022 1344  ? LEUKOCYTESUR  NEGATIVE 05/07/2022 1344  ? ? ?Sepsis Labs: ?Lactic Acid, Venous ?   ?Component Value Date/Time  ? LATICACIDVEN 1.8 05/08/2022 0352  ? ? ?MICROBIOLOGY: ?No results found for this or any previous visit (from the past 240 hour(s)). ? ?RADIOLOGY STUDIES/RESULTS: ?DG Chest 2 View ? ?Result Date: 05/07/2022 ?CLINICAL DATA:  Shortness of breath with generalized chest and abdominal pain, diaphoresis, weakness and vomiting. History of rectal cancer and diabetes. EXAM: CHEST - 2 VIEW COMPARISON:  Radiographs 02/04/2021.  CT 07/12/2010. FINDINGS: The heart size and mediastinal contours are normal. The lungs are clear. There is no pleural effusion or pneumothorax. No acute osseous findings are identified. Stable degenerative  changes in the spine. IMPRESSION: Stable chest.  No evidence of active cardiopulmonary process. Electronically Signed   By: Richardean Sale M.D.   On: 05/07/2022 14:54  ? ?CT ABDOMEN PELVIS W CONTRAST ? ?Result Date: 05/07/2022 ?CLINICAL DATA:  Acute abdominal pain. History of rectal carcinoma and colostomy. Multiple surgeries and colonoscopies. EXAM: CT ABDOMEN AND PELVIS WITH CONTRAST TECHNIQUE: Multidetector CT imaging of the abdomen and pelvis was performed using the standard protocol following bolus administration of intravenous contrast. RADIATION DOSE REDUCTION: This exam was performed according to the departmental dose-optimization program which includes automated exposure control, adjustment of the mA and/or kV according to patient size and/or use of iterative reconstruction technique. CONTRAST:  182m OMNIPAQUE IOHEXOL 300 MG/ML  SOLN COMPARISON:  CT 02/18/2021 FINDINGS: Lower chest: Lung bases are clear. Hepatobiliary: No focal hepatic lesion.  Gallbladder normal. Pancreas: Pancreas is normal. No ductal dilatation. No pancreatic inflammation. Spleen: Normal spleen Adrenals/urinary tract: Adrenal glands and kidneys are normal. The ureters and bladder normal. Stomach/Bowel: Stomach is normal. Small  amount fluid the stomach. The gastric antral region is thickened and collapsed (image 72/6 and image 28/3). No gastric outlet obstruction. Second and third portion duodenum normal. Proximal small bowel is rel

## 2022-05-09 DIAGNOSIS — I10 Essential (primary) hypertension: Secondary | ICD-10-CM | POA: Diagnosis not present

## 2022-05-09 DIAGNOSIS — K56609 Unspecified intestinal obstruction, unspecified as to partial versus complete obstruction: Secondary | ICD-10-CM | POA: Diagnosis not present

## 2022-05-09 DIAGNOSIS — E119 Type 2 diabetes mellitus without complications: Secondary | ICD-10-CM | POA: Diagnosis not present

## 2022-05-09 DIAGNOSIS — K2100 Gastro-esophageal reflux disease with esophagitis, without bleeding: Secondary | ICD-10-CM | POA: Diagnosis not present

## 2022-05-09 LAB — GLUCOSE, CAPILLARY
Glucose-Capillary: 112 mg/dL — ABNORMAL HIGH (ref 70–99)
Glucose-Capillary: 186 mg/dL — ABNORMAL HIGH (ref 70–99)

## 2022-05-09 MED ORDER — ZOLPIDEM TARTRATE 5 MG PO TABS
5.0000 mg | ORAL_TABLET | Freq: Once | ORAL | Status: AC
  Administered 2022-05-09: 5 mg via ORAL
  Filled 2022-05-09: qty 1

## 2022-05-09 NOTE — Progress Notes (Signed)
Mobility Specialist Progress Note  ? ? 05/09/22 0851  ?Mobility  ?Activity Ambulated independently in hallway  ?Level of Assistance Independent  ?Assistive Device None  ?Distance Ambulated (ft) 570 ft  ?Activity Response Tolerated well  ?$Mobility charge 1 Mobility  ? ?Pt received in chair and agreeable. No complaints on walk. Returned to chair with call bell in reach.   ? ?Phillip Tate ?Mobility Specialist  ?Primary: 5N M.S. Phone: [phone] ?Secondary: 6N M.S. Phone: [phone] ?  ?

## 2022-05-09 NOTE — Care Management Important Message (Signed)
Important Message ? ?Patient Details  ?Name: Phillip Tate ?MRN: [id number] ?Date of Birth: [birth year] ? ? ?Medicare Important Message Given:  Yes ? ? ? ? ?[name] ?05/09/2022, 3:35 PM ?

## 2022-05-09 NOTE — Progress Notes (Signed)
? ? ?   ?Subjective: ?Tolerating soft diet with no issues.  Ostomy still working well. ? ?ROS: See above, otherwise other systems negative ? ?Objective: ?Vital signs in last 24 hours: ?Temp:  [97.8 ?F (36.6 ?C)-98.5 ?F (36.9 ?C)] 98.5 ?F (36.9 ?C) (05/12 0820) ?Pulse Rate:  [63-71] 69 (05/12 0820) ?Resp:  [17-22] 17 (05/12 0820) ?BP: (112-140)/(59-73) 128/59 (05/12 0820) ?SpO2:  [94 %-98 %] 96 % (05/12 0820) ?Last BM Date : 05/09/22 ? ?Intake/Output from previous day: ?No intake/output data recorded. ?Intake/Output this shift: ?No intake/output data recorded. ? ?PE: ?Abd: soft, NT, ND, +BS, colostomy with output.   ? ?Lab Results:  ?Recent Labs  ?  05/07/22 ?1349 05/08/22 ?0352  ?WBC 10.9* 8.2  ?HGB 17.3* 15.6  ?HCT 52.5* 47.8  ?PLT 247 243  ? ?BMET ?Recent Labs  ?  05/07/22 ?1349 05/08/22 ?0352  ?NA 134* 138  ?K 5.3* 4.4  ?CL 102 106  ?CO2 23 26  ?GLUCOSE 205* 127*  ?BUN 13 12  ?CREATININE 0.85 0.81  ?CALCIUM 9.6 8.7*  ? ?PT/INR ?No results for input(s): LABPROT, INR in the last 72 hours. ?CMP  ?   ?Component Value Date/Time  ? NA 138 05/08/2022 0352  ? K 4.4 05/08/2022 0352  ? CL 106 05/08/2022 0352  ? CO2 26 05/08/2022 0352  ? GLUCOSE 127 (H) 05/08/2022 0352  ? BUN 12 05/08/2022 0352  ? CREATININE 0.81 05/08/2022 0352  ? CALCIUM 8.7 (L) 05/08/2022 0352  ? PROT 7.3 05/07/2022 1349  ? ALBUMIN 3.8 05/07/2022 1349  ? AST 25 05/07/2022 1349  ? ALT 23 05/07/2022 1349  ? ALKPHOS 78 05/07/2022 1349  ? BILITOT 0.7 05/07/2022 1349  ? GFRNONAA >60 05/08/2022 0352  ? GFRAA  03/03/2010 1455  ?  >60        ?The eGFR has been calculated ?using the MDRD equation. ?This calculation has not been ?validated in all clinical ?situations. ?eGFR's persistently ?<60 mL/min signify ?possible Chronic Kidney Disease.  ? ?Lipase  ?   ?Component Value Date/Time  ? LIPASE 26 05/07/2022 1349  ? ? ? ? ? ?Studies/Results: ?DG Chest 2 View ? ?Result Date: 05/07/2022 ?CLINICAL DATA:  Shortness of breath with generalized chest and abdominal pain,  diaphoresis, weakness and vomiting. History of rectal cancer and diabetes. EXAM: CHEST - 2 VIEW COMPARISON:  Radiographs 02/04/2021.  CT 07/12/2010. FINDINGS: The heart size and mediastinal contours are normal. The lungs are clear. There is no pleural effusion or pneumothorax. No acute osseous findings are identified. Stable degenerative changes in the spine. IMPRESSION: Stable chest.  No evidence of active cardiopulmonary process. Electronically Signed   By: Richardean Sale M.D.   On: 05/07/2022 14:54  ? ?CT ABDOMEN PELVIS W CONTRAST ? ?Result Date: 05/07/2022 ?CLINICAL DATA:  Acute abdominal pain. History of rectal carcinoma and colostomy. Multiple surgeries and colonoscopies. EXAM: CT ABDOMEN AND PELVIS WITH CONTRAST TECHNIQUE: Multidetector CT imaging of the abdomen and pelvis was performed using the standard protocol following bolus administration of intravenous contrast. RADIATION DOSE REDUCTION: This exam was performed according to the departmental dose-optimization program which includes automated exposure control, adjustment of the mA and/or kV according to patient size and/or use of iterative reconstruction technique. CONTRAST:  127m OMNIPAQUE IOHEXOL 300 MG/ML  SOLN COMPARISON:  CT 02/18/2021 FINDINGS: Lower chest: Lung bases are clear. Hepatobiliary: No focal hepatic lesion.  Gallbladder normal. Pancreas: Pancreas is normal. No ductal dilatation. No pancreatic inflammation. Spleen: Normal spleen Adrenals/urinary tract: Adrenal glands and kidneys are normal.  The ureters and bladder normal. Stomach/Bowel: Stomach is normal. Small amount fluid the stomach. The gastric antral region is thickened and collapsed (image 72/6 and image 28/3). No gastric outlet obstruction. Second and third portion duodenum normal. Proximal small bowel is relatively collapsed. Mid small bowel is dilated and fluid-filled with small air-fluid levels. Small bowel measures up to 3.9 cm (image 791/3). There small bowel small bowel  anastomosis in the RIGHT upper quadrant with associated caliber change (image 37/3). There is fecalization of internal contents proximal this caliber change consistent with stasis of enteric contents. Proximal to the transition the small bowel measures 5 cm and distal small bowel measures 1.5 cm. Small bowel is collapsed leading up terminal ileum. Ascending and transverse colon normal. Transverse colon exits the the LEFT abdominal wall colostomy without complication. Distal collapsed descending colon low rectal anastomosis noted. Vascular/Lymphatic: Abdominal aorta is normal caliber. No periportal or retroperitoneal adenopathy. No pelvic adenopathy. Reproductive: Unremarkable penile prosthetic noted Other: No intraperitoneal free air. No pneumatosis or portal venous gas. Musculoskeletal: No aggressive osseous lesion. IMPRESSION: 1. Partial small bowel obstructive pattern which is increased in obstructive characteristics compared to CT 02/18/2022. Small bowel obstruction etiology is suspected at enteric enteric anastomosis in the RIGHT upper quadrant. 2. LEFT lower quadrant colostomy without complication. Electronically Signed   By: Suzy Bouchard M.D.   On: 05/07/2022 16:24  ? ?DG Chest Portable 1 View ? ?Result Date: 05/07/2022 ?CLINICAL DATA:  Nasogastric tube placement EXAM: PORTABLE CHEST 1 VIEW COMPARISON:  05/07/2022 FINDINGS: Nasogastric tube extends into the upper abdomen beyond the margin of the examination. Lungs are clear. No pneumothorax or pleural effusion. Cardiac size within normal limits. Pulmonary vascularity is normal. IMPRESSION: Nasogastric tube extends into the upper abdomen, beyond the margin of the examination. Electronically Signed   By: Fidela Salisbury M.D.   On: 05/07/2022 22:22  ? ?DG Abd Portable 1V-Small Bowel Protocol-Position Verification ? ?Result Date: 05/07/2022 ?CLINICAL DATA:  NG tube placement EXAM: PORTABLE ABDOMEN - 1 VIEW COMPARISON:  05/07/2022 FINDINGS: Esophageal tube tip  overlies proximal stomach. Side-port probably just beyond GE junction. IMPRESSION: Esophageal tube tip overlies proximal stomach Electronically Signed   By: Donavan Foil M.D.   On: 05/07/2022 22:21  ? ?DG Abd Portable 1V-Small Bowel Obstruction Protocol-initial, 8 hr delay ? ?Result Date: 05/08/2022 ?CLINICAL DATA:  Small bowel obstruction. EXAM: PORTABLE ABDOMEN - 1 VIEW COMPARISON:  Upper abdominal radiograph 05/07/2022; CT abdomen and pelvis 05/07/2022 FINDINGS: There are dilated loops of small bowel measuring up to 4.3 cm. Enteric tube tip overlies the stomach and left upper quadrant. Oral contrast is seen within the ascending and transverse colon. No portal venous gas or pneumatosis is seen within the portion of the liver that is imaged. Evaluation for pneumoperitoneum is limited by portable supine technique, however none is identified. Mild L1-2 disc space narrowing and peripheral endplate osteophytes. IMPRESSION:: IMPRESSION: 1. There are again dilated loops of small bowel consistent with the partial small bowel obstruction at enteric enteric anastomosis seen on yesterday's CT. 2. Nasogastric tube in appropriate position. Electronically Signed   By: Yvonne Kendall M.D.   On: 05/08/2022 08:11   ? ?Anti-infectives: ?Anti-infectives (From admission, onward)  ? ? Start     Dose/Rate Route Frequency Ordered Stop  ? 05/08/22 0800  metroNIDAZOLE (FLAGYL) IVPB 500 mg       ? 500 mg ?100 mL/hr over 60 Minutes Intravenous Every 12 hours 05/07/22 2023    ? 05/07/22 1830  metroNIDAZOLE (FLAGYL) IVPB 500  mg  Status:  Discontinued       ? 500 mg ?100 mL/hr over 60 Minutes Intravenous Every 8 hours 05/07/22 1818 05/07/22 2023  ? ?  ? ? ? ?Assessment/Plan ?SBO ?-history of multiple abdominal surgeries ?-tolerating soft diet ?-soft diet for 1-2 weeks then can resume his normal diet.  Discussed eating a big bolus of fiber at once and trying to avoid this and drinking more water ?-I have given him our information so he can  establish with one of our colorectal surgeons for monitoring and maintenance as needed.  ?-stable for DC home ? ?FEN - soft ?VTE - ok for DVT prophylaxis from our standpoint ?ID - flagyl ? ?I reviewed hospitalist

## 2022-05-09 NOTE — Progress Notes (Signed)
Mechele Collin to be D/C'd  per MD order.  Discussed with the patient and all questions fully answered. ? ?VSS, Skin clean, dry and intact without evidence of skin break down, no evidence of skin tears noted. ? ?IV catheter discontinued intact. Site without signs and symptoms of complications. Dressing and pressure applied. ? ?An After Visit Summary was printed and given to the patient. ? ?D/c education completed with patient/family including follow up instructions, medication list, d/c activities limitations if indicated, with other d/c instructions as indicated by MD - patient able to verbalize understanding, all questions fully answered.  ? ?Patient instructed to return to ED, call 911, or call MD for any changes in condition.  ? ?Patient to be escorted via [place], and D/C home via private auto.  ?

## 2022-05-09 NOTE — Discharge Instructions (Signed)
Low fiber diet for 1-2 weeks, then may resume your normal diet ?

## 2022-05-09 NOTE — Discharge Summary (Signed)
? ?PATIENT DETAILS ?Name: Phillip Tate ?Age: 66 y.o. ?Sex: male ?Date of Birth: 24-Nov-1956 ?MRN: [id number]. ?Admitting Physician: Orma Flaming, MD ?[id number], [place] Ar, MD ? ?Admit Date: 05/07/2022 ?Discharge date: 05/09/2022 ? ?Recommendations for Outpatient Follow-up:  ?Follow up with PCP in 1-2 weeks ?Please obtain CMP/CBC in one week ?Please ensure follow-up with general surgery. ? ?Admitted From:  ?Home ? ?Disposition: ?Home ?  ?Discharge Condition: ?fair ? ?CODE STATUS: ?  Code Status: Full Code  ? ?Diet recommendation:  ?Diet Order   ? ?       ?  Diet - low sodium heart healthy       ?  ?  Diet Carb Modified       ?  ?  DIET SOFT Room service appropriate? Yes; Fluid consistency: Thin  Diet effective now       ?  ? ?  ?  ? ?  ?  ? ?Brief Summary: ?Patient is a 66 y.o.  male with history of DM-2, HTN, HLD, history of rectal cancer-s/p chemo/radiation/colon resection-ostomy in place-presented with abdominal pain-found to have SBO. ?  ?Significant events: ?5/10>> admit for SBO. ?  ?Significant studies: ?5/10>>CT abdomen/pelvis: Partial SBO ?  ?Significant microbiology data: ?  ?  ?Procedures: ?  ?  ?Consults: ?CCS ?  ? ?Brief Hospital Course: ?Small bowel obstruction: Resolved-managed with supportive care-by day of discharge tolerating soft diet-has output in ostomy bag.  No further abdominal pain.  ?  ?HTN: BP stable-continue labetalol and lisinopril.   ? ?HLD: Resume Crestor.   ?  ?DM-2 (A1c 6.6 on 5/10): CBG stable-resume outpatient insulin regimen. ? ?History of exertional dyspnea: Recently evaluated by [facility] cardiology-he apparently was scheduled for a LHC later this week-but he has called cardiology and has this postponed.  He currently has no anginal symptoms. ?  ?GERD: Continue PPI ?  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  He apparently had a proctoplasty on 4/24-and  is supposed to be on Flagyl-this is to be continued per previous plan. ?  ?Obesity: ?Estimated body mass index is 31.98 kg/m? as calculated from the following: ?  Height as of this encounter: '5\' 10"'$  (1.778 m). ?  Weight as of this encounter: 101.1 kg.  ?  ?Code status: ?Full code ? ?Discharge Diagnoses:  ?Principal Problem: ?  Small bowel obstruction ([place]) ?Active Problems: ?  History of rectal cancer with ostomy  ?  Frequent PVCs ?  Diabetes mellitus without complication ([place]) ?  Essential hypertension ?  Gastroesophageal reflux disease ?  Hyperlipidemia ? ? ?Discharge Instructions: ? ?Activity:  ?As tolerated  ? ?Discharge Instructions   ? ? Call MD for:  persistant nausea and vomiting   Complete by: As directed ?  ? Call MD for:  severe uncontrolled pain   Complete by: As directed ?  ? Diet - low sodium heart healthy   Complete by: As directed ?  ? Diet Carb Modified   Complete by: As directed ?  ? Discharge instructions   Complete by: As directed ?  ? Follow with Primary MD  Wenda Low, MD in 1-2 weeks ? ?Follow-up with [facility] surgery-office will call you with a follow-up appointment. ? ?Please get a complete blood count and chemistry panel checked by your Primary MD at your next visit, and again as instructed by your Primary MD. ? ?Get Medicines reviewed and adjusted: ?Please take all your  medications with you for your next visit with your Primary MD ? ?Laboratory/radiological data: ?Please request your Primary MD to go over all hospital tests and procedure/radiological results at the follow up, please ask your Primary MD to get all Hospital records sent to his/her office. ? ?In some cases, they will be blood work, cultures and biopsy results pending at the time of your discharge. Please request that your primary care M.D. follows up on these results. ? ?Also Note the following: ?If you experience worsening of your admission symptoms, develop shortness of breath, life threatening emergency,  suicidal or homicidal thoughts you must seek medical attention immediately by calling 911 or calling your MD immediately  if symptoms less severe. ? ?You must read complete instructions/literature along with all the possible adverse reactions/side effects for all the Medicines you take and that have been prescribed to you. Take any new Medicines after you have completely understood and accpet all the possible adverse reactions/side effects.  ? ?Do not drive when taking Pain medications or sleeping medications (Benzodaizepines) ? ?Do not take more than prescribed Pain, Sleep and Anxiety Medications. It is not advisable to combine anxiety,sleep and pain medications without talking with your primary care practitioner ? ?Special Instructions: If you have smoked or chewed Tobacco  in the last 2 yrs please stop smoking, stop any regular Alcohol  and or any Recreational drug use. ? ?Wear Seat belts while driving. ? ?Please note: ?You were cared for by a hospitalist during your hospital stay. Once you are discharged, your primary care physician will handle any further medical issues. Please note that NO REFILLS for any discharge medications will be authorized once you are discharged, as it is imperative that you return to your primary care physician (or establish a relationship with a primary care physician if you do not have one) for your post hospital discharge needs so that they can reassess your need for medications and monitor your lab values.  ? Increase activity slowly   Complete by: As directed ?  ? ?  ? ?Allergies as of 05/09/2022   ? ?   Reactions  ? Cardizem Cd [diltiazem Hcl Er Beads] Swelling  ? Toprol Xl [metoprolol] Other (See Comments)  ? headache  ? Tricor [fenofibrate] Other (See Comments)  ? Constipation  ? ?  ? ?  ?Medication List  ?  ? ?STOP taking these medications   ? ?AMBULATORY NON FORMULARY MEDICATION ?  ?colestipol 1 g tablet ?Commonly known as: COLESTID ?  ? ?  ? ?TAKE these medications    ? ?amLODipine 10 MG tablet ?Commonly known as: NORVASC ?Take 10 mg by mouth daily. ?  ?empagliflozin 25 MG Tabs tablet ?Commonly known as: JARDIANCE ?Take 25 mg by mouth daily. ?  ?gabapentin 300 MG capsule ?Commonly known as: NEURONTIN ?Take 300 mg by mouth 3 (three) times daily. ?  ?insulin glargine 100 UNIT/ML injection ?Commonly known as: LANTUS ?Inject 44 Units into the skin at bedtime. ?  ?IRON PO ?Take 1 tablet by mouth daily. ?  ?labetalol 300 MG tablet ?Commonly known as: NORMODYNE ?Take 300 mg by mouth 2 (two) times daily. ?  ?lactulose 10 g packet ?Commonly known as: [place] ?Take 10 g by mouth every other day. ?  ?lisinopril 40 MG tablet ?Commonly known as: ZESTRIL ?Take 40 mg by mouth every evening. ?  ?mesalamine 1.2 g EC tablet ?Commonly known as: LIALDA ?Take 2 tablets (2.4 g total) by mouth daily with breakfast. ?  ?metFORMIN 850 MG  tablet ?Commonly known as: GLUCOPHAGE ?Take 850 mg by mouth 2 (two) times daily with a meal. ?  ?metronidazole 375 MG capsule ?Commonly known as: FLAGYL ?Take 375 mg by mouth in the morning, at noon, and at bedtime. ?  ?NovoLOG FlexPen 100 UNIT/ML FlexPen ?Generic drug: insulin aspart ?Inject 17-45 Units into the skin See admin instructions. Take 17 to 18 units at breakfast, 25 to 30 units at lunch, and 40 to 45 units at evening ?  ?pantoprazole 40 MG tablet ?Commonly known as: PROTONIX ?Take 1 tablet (40 mg total) by mouth 2 (two) times daily. ?  ?rosuvastatin 10 MG tablet ?Commonly known as: CRESTOR ?Take 1 tablet (10 mg total) by mouth daily. ?What changed:  ?how much to take ?when to take this ?additional instructions ?  ?SYSTANE FREE OP ?Place 1 drop into both eyes in the morning and at bedtime. ?  ?zolpidem 10 MG tablet ?Commonly known as: AMBIEN ?Take 10 mg by mouth at bedtime. ?  ? ?  ? ? Follow-up Information   ? ? Wenda Low, MD. Schedule an appointment as soon as possible for a visit in 1 week(s).   ?Specialty: Internal Medicine ?Contact  information: ?[address], [address] ?[place] [address] ?[phone] ? ? ?  ?  ? ? [facility] Follow up.   ?Why: Office will call with date/time ?Contact information: ?[address] ?[address]

## 2022-05-12 DIAGNOSIS — I25118 Atherosclerotic heart disease of native coronary artery with other forms of angina pectoris: Secondary | ICD-10-CM | POA: Diagnosis not present

## 2022-05-12 DIAGNOSIS — I209 Angina pectoris, unspecified: Secondary | ICD-10-CM | POA: Diagnosis not present

## 2022-05-12 DIAGNOSIS — E119 Type 2 diabetes mellitus without complications: Secondary | ICD-10-CM | POA: Diagnosis not present

## 2022-05-12 DIAGNOSIS — I10 Essential (primary) hypertension: Secondary | ICD-10-CM | POA: Diagnosis not present

## 2022-05-12 DIAGNOSIS — R9431 Abnormal electrocardiogram [ECG] [EKG]: Secondary | ICD-10-CM | POA: Diagnosis not present

## 2022-05-12 DIAGNOSIS — R0609 Other forms of dyspnea: Secondary | ICD-10-CM | POA: Diagnosis not present

## 2022-05-12 DIAGNOSIS — I493 Ventricular premature depolarization: Secondary | ICD-10-CM | POA: Diagnosis not present

## 2022-05-12 DIAGNOSIS — I517 Cardiomegaly: Secondary | ICD-10-CM | POA: Diagnosis not present

## 2022-05-13 DIAGNOSIS — E1142 Type 2 diabetes mellitus with diabetic polyneuropathy: Secondary | ICD-10-CM | POA: Diagnosis not present

## 2022-05-13 DIAGNOSIS — I10 Essential (primary) hypertension: Secondary | ICD-10-CM | POA: Diagnosis not present

## 2022-05-13 DIAGNOSIS — E785 Hyperlipidemia, unspecified: Secondary | ICD-10-CM | POA: Diagnosis not present

## 2022-05-14 DIAGNOSIS — Z933 Colostomy status: Secondary | ICD-10-CM | POA: Diagnosis not present

## 2022-05-14 DIAGNOSIS — K56609 Unspecified intestinal obstruction, unspecified as to partial versus complete obstruction: Secondary | ICD-10-CM | POA: Diagnosis not present

## 2022-05-14 DIAGNOSIS — I2510 Atherosclerotic heart disease of native coronary artery without angina pectoris: Secondary | ICD-10-CM | POA: Diagnosis not present

## 2022-05-22 DIAGNOSIS — M1812 Unilateral primary osteoarthritis of first carpometacarpal joint, left hand: Secondary | ICD-10-CM | POA: Diagnosis not present

## 2022-05-27 DIAGNOSIS — E291 Testicular hypofunction: Secondary | ICD-10-CM | POA: Diagnosis not present

## 2022-05-29 DIAGNOSIS — H40013 Open angle with borderline findings, low risk, bilateral: Secondary | ICD-10-CM | POA: Diagnosis not present

## 2022-05-29 DIAGNOSIS — H04123 Dry eye syndrome of bilateral lacrimal glands: Secondary | ICD-10-CM | POA: Diagnosis not present

## 2022-05-30 ENCOUNTER — Other Ambulatory Visit: Payer: Self-pay | Admitting: Gastroenterology

## 2022-05-30 DIAGNOSIS — R1013 Epigastric pain: Secondary | ICD-10-CM

## 2022-05-30 DIAGNOSIS — R110 Nausea: Secondary | ICD-10-CM

## 2022-06-04 ENCOUNTER — Telehealth: Payer: Self-pay

## 2022-06-04 NOTE — Telephone Encounter (Signed)
NOTES SCANNED TO REFERRAL 

## 2022-06-05 DIAGNOSIS — R002 Palpitations: Secondary | ICD-10-CM | POA: Diagnosis not present

## 2022-06-10 ENCOUNTER — Ambulatory Visit: Payer: Medicare HMO | Admitting: Gastroenterology

## 2022-06-12 DIAGNOSIS — E1165 Type 2 diabetes mellitus with hyperglycemia: Secondary | ICD-10-CM | POA: Diagnosis not present

## 2022-06-13 DIAGNOSIS — R002 Palpitations: Secondary | ICD-10-CM | POA: Diagnosis not present

## 2022-06-17 DIAGNOSIS — Z85048 Personal history of other malignant neoplasm of rectum, rectosigmoid junction, and anus: Secondary | ICD-10-CM | POA: Diagnosis not present

## 2022-06-17 NOTE — Progress Notes (Signed)
Formatting of this note might be different from the original.  66 year old gentleman with extensive history documented in his initial note having undergone proctectomy with coloanal J-pouch in 2006 for rectal cancer from which she appears to be cured.  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  I tried to help him understand completion proctectomy would be a massive undertaking for presumably persistent but benign process.  I strongly discouraged him from pushing for that operation.    Since that time, armed without understanding he thinks he is doing somewhat better.  He largely came back because he wanted to have an established relationship with a surgeon in case or other problems in the future.    I told him I would be happy to see him if he has surgical problems in the future    Electronically signed by: Cathie Beams, MD  06/24/22 2204    Electronically signed by Cathie Beams, MD at 06/24/2022 10:04 PM EDT

## 2022-06-18 DIAGNOSIS — I10 Essential (primary) hypertension: Secondary | ICD-10-CM | POA: Diagnosis not present

## 2022-06-18 DIAGNOSIS — E1122 Type 2 diabetes mellitus with diabetic chronic kidney disease: Secondary | ICD-10-CM | POA: Diagnosis not present

## 2022-06-18 DIAGNOSIS — E785 Hyperlipidemia, unspecified: Secondary | ICD-10-CM | POA: Diagnosis not present

## 2022-06-18 DIAGNOSIS — D509 Iron deficiency anemia, unspecified: Secondary | ICD-10-CM | POA: Diagnosis not present

## 2022-06-18 DIAGNOSIS — E1142 Type 2 diabetes mellitus with diabetic polyneuropathy: Secondary | ICD-10-CM | POA: Diagnosis not present

## 2022-06-18 DIAGNOSIS — Z933 Colostomy status: Secondary | ICD-10-CM | POA: Diagnosis not present

## 2022-06-18 DIAGNOSIS — Z85048 Personal history of other malignant neoplasm of rectum, rectosigmoid junction, and anus: Secondary | ICD-10-CM | POA: Diagnosis not present

## 2022-06-18 DIAGNOSIS — J309 Allergic rhinitis, unspecified: Secondary | ICD-10-CM | POA: Diagnosis not present

## 2022-06-18 DIAGNOSIS — Z0000 Encounter for general adult medical examination without abnormal findings: Secondary | ICD-10-CM | POA: Diagnosis not present

## 2022-06-22 DIAGNOSIS — I25118 Atherosclerotic heart disease of native coronary artery with other forms of angina pectoris: Secondary | ICD-10-CM | POA: Insufficient documentation

## 2022-06-22 DIAGNOSIS — I2510 Atherosclerotic heart disease of native coronary artery without angina pectoris: Secondary | ICD-10-CM | POA: Insufficient documentation

## 2022-06-23 DIAGNOSIS — I209 Angina pectoris, unspecified: Secondary | ICD-10-CM | POA: Diagnosis not present

## 2022-06-23 DIAGNOSIS — I25118 Atherosclerotic heart disease of native coronary artery with other forms of angina pectoris: Secondary | ICD-10-CM | POA: Diagnosis not present

## 2022-06-23 DIAGNOSIS — I493 Ventricular premature depolarization: Secondary | ICD-10-CM | POA: Diagnosis not present

## 2022-06-24 DIAGNOSIS — E1122 Type 2 diabetes mellitus with diabetic chronic kidney disease: Secondary | ICD-10-CM | POA: Diagnosis not present

## 2022-06-24 DIAGNOSIS — E782 Mixed hyperlipidemia: Secondary | ICD-10-CM | POA: Diagnosis not present

## 2022-06-24 DIAGNOSIS — I10 Essential (primary) hypertension: Secondary | ICD-10-CM | POA: Diagnosis not present

## 2022-06-24 DIAGNOSIS — Z125 Encounter for screening for malignant neoplasm of prostate: Secondary | ICD-10-CM | POA: Diagnosis not present

## 2022-06-25 DIAGNOSIS — H04123 Dry eye syndrome of bilateral lacrimal glands: Secondary | ICD-10-CM | POA: Diagnosis not present

## 2022-07-02 ENCOUNTER — Ambulatory Visit: Payer: Medicare HMO | Admitting: Interventional Cardiology

## 2022-07-16 DIAGNOSIS — K219 Gastro-esophageal reflux disease without esophagitis: Secondary | ICD-10-CM | POA: Diagnosis not present

## 2022-07-16 DIAGNOSIS — E1142 Type 2 diabetes mellitus with diabetic polyneuropathy: Secondary | ICD-10-CM | POA: Diagnosis not present

## 2022-07-16 DIAGNOSIS — I10 Essential (primary) hypertension: Secondary | ICD-10-CM | POA: Diagnosis not present

## 2022-07-16 DIAGNOSIS — E785 Hyperlipidemia, unspecified: Secondary | ICD-10-CM | POA: Diagnosis not present

## 2022-07-21 DIAGNOSIS — Z933 Colostomy status: Secondary | ICD-10-CM | POA: Diagnosis not present

## 2022-08-07 DIAGNOSIS — I493 Ventricular premature depolarization: Secondary | ICD-10-CM | POA: Diagnosis not present

## 2022-08-12 ENCOUNTER — Ambulatory Visit: Payer: Medicare HMO | Admitting: Interventional Cardiology

## 2022-08-19 DIAGNOSIS — E1142 Type 2 diabetes mellitus with diabetic polyneuropathy: Secondary | ICD-10-CM | POA: Diagnosis not present

## 2022-08-19 DIAGNOSIS — Z933 Colostomy status: Secondary | ICD-10-CM | POA: Diagnosis not present

## 2022-08-19 DIAGNOSIS — F411 Generalized anxiety disorder: Secondary | ICD-10-CM | POA: Diagnosis not present

## 2022-08-19 DIAGNOSIS — M5136 Other intervertebral disc degeneration, lumbar region: Secondary | ICD-10-CM | POA: Diagnosis not present

## 2022-08-19 DIAGNOSIS — I10 Essential (primary) hypertension: Secondary | ICD-10-CM | POA: Diagnosis not present

## 2022-08-19 DIAGNOSIS — E785 Hyperlipidemia, unspecified: Secondary | ICD-10-CM | POA: Diagnosis not present

## 2022-08-19 DIAGNOSIS — I493 Ventricular premature depolarization: Secondary | ICD-10-CM | POA: Diagnosis not present

## 2022-08-21 DIAGNOSIS — R202 Paresthesia of skin: Secondary | ICD-10-CM | POA: Diagnosis not present

## 2022-08-21 DIAGNOSIS — M79605 Pain in left leg: Secondary | ICD-10-CM | POA: Diagnosis not present

## 2022-08-21 DIAGNOSIS — R531 Weakness: Secondary | ICD-10-CM | POA: Diagnosis not present

## 2022-08-25 ENCOUNTER — Ambulatory Visit (HOSPITAL_COMMUNITY): Payer: Medicare HMO | Admitting: Nurse Practitioner

## 2022-08-26 DIAGNOSIS — R202 Paresthesia of skin: Secondary | ICD-10-CM | POA: Diagnosis not present

## 2022-08-26 DIAGNOSIS — E291 Testicular hypofunction: Secondary | ICD-10-CM | POA: Diagnosis not present

## 2022-08-26 DIAGNOSIS — Z794 Long term (current) use of insulin: Secondary | ICD-10-CM | POA: Diagnosis not present

## 2022-08-26 DIAGNOSIS — E1122 Type 2 diabetes mellitus with diabetic chronic kidney disease: Secondary | ICD-10-CM | POA: Diagnosis not present

## 2022-08-26 DIAGNOSIS — R531 Weakness: Secondary | ICD-10-CM | POA: Diagnosis not present

## 2022-08-26 DIAGNOSIS — N181 Chronic kidney disease, stage 1: Secondary | ICD-10-CM | POA: Diagnosis not present

## 2022-08-26 DIAGNOSIS — M79605 Pain in left leg: Secondary | ICD-10-CM | POA: Diagnosis not present

## 2022-08-28 DIAGNOSIS — R531 Weakness: Secondary | ICD-10-CM | POA: Diagnosis not present

## 2022-08-28 DIAGNOSIS — M79605 Pain in left leg: Secondary | ICD-10-CM | POA: Diagnosis not present

## 2022-08-28 DIAGNOSIS — R202 Paresthesia of skin: Secondary | ICD-10-CM | POA: Diagnosis not present

## 2022-08-29 ENCOUNTER — Ambulatory Visit (HOSPITAL_COMMUNITY)
Admission: RE | Admit: 2022-08-29 | Discharge: 2022-08-29 | Disposition: A | Discharge status: Home / Self Care | Payer: Medicare HMO | Source: Ambulatory Visit | Attending: Nurse Practitioner | Admitting: Nurse Practitioner

## 2022-08-29 DIAGNOSIS — K9400 Colostomy complication, unspecified: Secondary | ICD-10-CM

## 2022-08-29 DIAGNOSIS — K435 Parastomal hernia without obstruction or  gangrene: Secondary | ICD-10-CM | POA: Diagnosis not present

## 2022-08-29 DIAGNOSIS — L24B3 Irritant contact dermatitis related to fecal or urinary stoma or fistula: Secondary | ICD-10-CM | POA: Diagnosis not present

## 2022-08-29 DIAGNOSIS — Z933 Colostomy status: Secondary | ICD-10-CM | POA: Insufficient documentation

## 2022-08-29 DIAGNOSIS — K432 Incisional hernia without obstruction or gangrene: Secondary | ICD-10-CM | POA: Diagnosis not present

## 2022-08-29 NOTE — Progress Notes (Signed)
[facility]   Reason for visit:  LLQ loop colostomy with flush stoma on mucus fistula side. Experiencing leaks HPI:  Fecal incontinence with resulting loop colostomy ROS  Review of Systems  Gastrointestinal:        LLQ colostomy with parastomal hernia Recessed stoma at site of mucus fistula  Skin:  Positive for color change.       Peristomal skin darkened  All other systems reviewed and are negative.  Vital signs:  BP 110/60 (BP Location: Right Arm)   Pulse (!) 59   Temp 98.7 F (37.1 C) (Oral)   Resp 18   SpO2 96%  Exam:  Physical Exam Vitals reviewed.  Constitutional:      Appearance: Normal appearance.  Abdominal:     Hernia: A hernia is present.  Skin:    Findings: Erythema present.     Comments: Darkening around stoma  Neurological:     Mental Status: He is alert and oriented to person, place, and time.  Psychiatric:        Mood and Affect: Mood normal.        Behavior: Behavior normal.     Stoma type/location:  LLQ loop colostomy Stomal assessment/size:   Peristomal assessment:  1 3/8" pink and moist stoma, mucus fistula is recessed and patient experiencing leaks.  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  Treatment options for stomal/peristomal skin: Add 1/2 barrier strip to recessed side.  Continue 1 piece convex pouch. Add ostomy/hernia belt.  Output: soft brown stool Ostomy pouching: 1pc.convex coloplast Education provided:  Applied belt. Educated that it provides support and should decrease/prevent leaks.  Discussed hernia and that there would likely be no intervention as long as stoma is producing stool.     Impression/dx  Contact dermatitis Parastomal hernia Colostomy Discussion  Add belt Plan  See back as needed.     Visit time: 45 minutes.   Domenic Moras FNP-BC

## 2022-08-31 DIAGNOSIS — I493 Ventricular premature depolarization: Secondary | ICD-10-CM | POA: Diagnosis not present

## 2022-09-02 DIAGNOSIS — R109 Unspecified abdominal pain: Secondary | ICD-10-CM | POA: Diagnosis not present

## 2022-09-02 DIAGNOSIS — K9400 Colostomy complication, unspecified: Secondary | ICD-10-CM | POA: Insufficient documentation

## 2022-09-02 DIAGNOSIS — I493 Ventricular premature depolarization: Secondary | ICD-10-CM | POA: Diagnosis not present

## 2022-09-02 DIAGNOSIS — K439 Ventral hernia without obstruction or gangrene: Secondary | ICD-10-CM | POA: Diagnosis not present

## 2022-09-02 DIAGNOSIS — L24B3 Irritant contact dermatitis related to fecal or urinary stoma or fistula: Secondary | ICD-10-CM | POA: Insufficient documentation

## 2022-09-02 DIAGNOSIS — K432 Incisional hernia without obstruction or gangrene: Secondary | ICD-10-CM | POA: Insufficient documentation

## 2022-09-02 NOTE — Discharge Instructions (Signed)
Follow up as needed

## 2022-09-03 ENCOUNTER — Other Ambulatory Visit: Payer: Self-pay | Admitting: Internal Medicine

## 2022-09-03 DIAGNOSIS — K9400 Colostomy complication, unspecified: Secondary | ICD-10-CM

## 2022-09-04 ENCOUNTER — Ambulatory Visit
Admission: RE | Admit: 2022-09-04 | Discharge: 2022-09-04 | Disposition: A | Discharge status: Home / Self Care | Payer: Medicare HMO | Source: Ambulatory Visit | Attending: Internal Medicine | Admitting: Internal Medicine

## 2022-09-04 DIAGNOSIS — K6389 Other specified diseases of intestine: Secondary | ICD-10-CM | POA: Diagnosis not present

## 2022-09-04 DIAGNOSIS — Z933 Colostomy status: Secondary | ICD-10-CM | POA: Diagnosis not present

## 2022-09-04 DIAGNOSIS — E1165 Type 2 diabetes mellitus with hyperglycemia: Secondary | ICD-10-CM | POA: Diagnosis not present

## 2022-09-04 DIAGNOSIS — K435 Parastomal hernia without obstruction or  gangrene: Secondary | ICD-10-CM | POA: Diagnosis not present

## 2022-09-04 DIAGNOSIS — K9400 Colostomy complication, unspecified: Secondary | ICD-10-CM

## 2022-09-04 DIAGNOSIS — M2578 Osteophyte, vertebrae: Secondary | ICD-10-CM | POA: Diagnosis not present

## 2022-09-04 MED ORDER — IOPAMIDOL (ISOVUE-300) INJECTION 61%
100.0000 mL | Freq: Once | INTRAVENOUS | Status: AC | PRN
  Administered 2022-09-04: 100 mL via INTRAVENOUS

## 2022-09-06 DIAGNOSIS — Z933 Colostomy status: Secondary | ICD-10-CM | POA: Diagnosis not present

## 2022-09-08 DIAGNOSIS — K435 Parastomal hernia without obstruction or  gangrene: Secondary | ICD-10-CM | POA: Diagnosis not present

## 2022-09-10 DIAGNOSIS — H40013 Open angle with borderline findings, low risk, bilateral: Secondary | ICD-10-CM | POA: Diagnosis not present

## 2022-09-10 DIAGNOSIS — Z961 Presence of intraocular lens: Secondary | ICD-10-CM | POA: Diagnosis not present

## 2022-09-10 DIAGNOSIS — E113291 Type 2 diabetes mellitus with mild nonproliferative diabetic retinopathy without macular edema, right eye: Secondary | ICD-10-CM | POA: Diagnosis not present

## 2022-09-10 DIAGNOSIS — H04123 Dry eye syndrome of bilateral lacrimal glands: Secondary | ICD-10-CM | POA: Diagnosis not present

## 2022-09-10 DIAGNOSIS — H25812 Combined forms of age-related cataract, left eye: Secondary | ICD-10-CM | POA: Diagnosis not present

## 2022-09-17 ENCOUNTER — Encounter: Payer: Self-pay | Admitting: Interventional Cardiology

## 2022-09-19 DIAGNOSIS — Z85048 Personal history of other malignant neoplasm of rectum, rectosigmoid junction, and anus: Secondary | ICD-10-CM | POA: Diagnosis not present

## 2022-09-19 DIAGNOSIS — Z933 Colostomy status: Secondary | ICD-10-CM | POA: Diagnosis not present

## 2022-09-19 DIAGNOSIS — E1165 Type 2 diabetes mellitus with hyperglycemia: Secondary | ICD-10-CM | POA: Diagnosis not present

## 2022-09-19 DIAGNOSIS — I209 Angina pectoris, unspecified: Secondary | ICD-10-CM | POA: Diagnosis not present

## 2022-09-19 DIAGNOSIS — Z794 Long term (current) use of insulin: Secondary | ICD-10-CM | POA: Diagnosis not present

## 2022-09-19 DIAGNOSIS — I10 Essential (primary) hypertension: Secondary | ICD-10-CM | POA: Diagnosis not present

## 2022-09-19 DIAGNOSIS — I25118 Atherosclerotic heart disease of native coronary artery with other forms of angina pectoris: Secondary | ICD-10-CM | POA: Diagnosis not present

## 2022-09-19 DIAGNOSIS — I493 Ventricular premature depolarization: Secondary | ICD-10-CM | POA: Diagnosis not present

## 2022-09-19 DIAGNOSIS — Z01818 Encounter for other preprocedural examination: Secondary | ICD-10-CM | POA: Diagnosis not present

## 2022-09-22 ENCOUNTER — Ambulatory Visit: Payer: Medicare HMO | Admitting: Interventional Cardiology

## 2022-09-26 DIAGNOSIS — I209 Angina pectoris, unspecified: Secondary | ICD-10-CM | POA: Diagnosis not present

## 2022-09-26 DIAGNOSIS — I2510 Atherosclerotic heart disease of native coronary artery without angina pectoris: Secondary | ICD-10-CM | POA: Diagnosis not present

## 2022-09-26 DIAGNOSIS — K432 Incisional hernia without obstruction or gangrene: Secondary | ICD-10-CM | POA: Diagnosis not present

## 2022-09-26 DIAGNOSIS — Z79899 Other long term (current) drug therapy: Secondary | ICD-10-CM | POA: Diagnosis not present

## 2022-09-26 DIAGNOSIS — I10 Essential (primary) hypertension: Secondary | ICD-10-CM | POA: Diagnosis not present

## 2022-09-26 DIAGNOSIS — Z794 Long term (current) use of insulin: Secondary | ICD-10-CM | POA: Diagnosis not present

## 2022-09-26 DIAGNOSIS — I25119 Atherosclerotic heart disease of native coronary artery with unspecified angina pectoris: Secondary | ICD-10-CM | POA: Diagnosis not present

## 2022-09-26 DIAGNOSIS — E1165 Type 2 diabetes mellitus with hyperglycemia: Secondary | ICD-10-CM | POA: Diagnosis not present

## 2022-09-26 DIAGNOSIS — K219 Gastro-esophageal reflux disease without esophagitis: Secondary | ICD-10-CM | POA: Diagnosis not present

## 2022-09-26 DIAGNOSIS — M5090 Cervical disc disorder, unspecified, unspecified cervical region: Secondary | ICD-10-CM | POA: Diagnosis not present

## 2022-09-26 DIAGNOSIS — I493 Ventricular premature depolarization: Secondary | ICD-10-CM | POA: Diagnosis not present

## 2022-09-26 DIAGNOSIS — I25118 Atherosclerotic heart disease of native coronary artery with other forms of angina pectoris: Secondary | ICD-10-CM | POA: Diagnosis not present

## 2022-09-26 DIAGNOSIS — I739 Peripheral vascular disease, unspecified: Secondary | ICD-10-CM | POA: Diagnosis not present

## 2022-09-29 NOTE — Telephone Encounter (Signed)
Formatting of this note might be different from the original.  Spoke with Mr. Abrahamsen as he was same day discharged s/p PVC ablation on 09/26/22. He reports overall doing great. Reviewed post ablation and procedure site care instructions with him. Provided phone numbers to contact with concerns. Addressed question, voiced understanding.   Electronically signed by Isidor Holts, RN at 09/29/2022 12:27 PM EDT

## 2022-10-07 NOTE — Other (Signed)
Called surgeon's office and they stated pat was not needed because patient has had so many surgeries that it wouldn't add any value

## 2022-10-09 DIAGNOSIS — Z933 Colostomy status: Secondary | ICD-10-CM | POA: Diagnosis not present

## 2022-10-14 NOTE — Other (Signed)
PATIENT STATES HE PICKED UP HIS PREOP PRESCRIPTIONS FROM DR. GHAEMMAGHAMI  ALREADY.

## 2022-10-14 NOTE — Other (Addendum)
Evan Shepherd PHONECALL COMPLETED , PATIENT LIVES IN [place], [place], PATIENT HAS GOTTEN CARDIAC CLEARANCE FROM HIS CARDIOLOGIST FROM [facility], PATIENT STATES DR. Nanda Quinton HAS ALL HIS PREOP LAB WORK AND EKG RESULTS. PATIENT ORDERED HIS PRE SURGERY DRINKS OFF [employer] AND HAS CHG SOAP AND KNOWS HOW TO USE BEFORE SURGERY. PATIENT STATED HE KNOWS WHERE TO CHECK IN AT [facility] AND HAS BEEN TOLD A TIME TO BE THERE ON DAY OF SURGERY.                 [facility] PRE[facility]ATIVE INSTRUCTIONS  ERAS    Surgery Date:   10/16/22     Your surgeon's office or [facility] staff will call you between 4 PM- 8 PM the day before surgery with your arrival time. If your surgery is on a Monday, you will receive a call the preceding Friday. If your surgeon's office has given you, your arrival time, then go by that time.    Please report to [facility] Patient Access/Admitting on the 1st floor.  Bring your insurance card, photo identification, and any copayment ( if applicable).   If you are going home the same day of your surgery, you must have a responsible adult to drive you home. You need to have a responsible adult to stay with you the first 24 hours after surgery and you should not drive a car for 24 hours following your surgery.  If you are being admitted to the hospital, please leave personal belongings/luggage in your car until you have an assigned hospital room number.  Please refer to ERAS book for Pre-operative Nutrition Plan.  Do NOT drink alcohol or smoke 24 hours before surgery. STOP smoking for 14 days prior as it helps with breathing and healing after surgery.  Please wear comfortable clothes. Wear your glasses instead of contacts. We ask that all money, jewelry and valuables be left at home. Wear no make up, particularly mascara, the day of surgery.    All body piercings, rings, and jewelry need to be removed and left at home. Do not wear any nail polish except or clear. Please wear your hair loose or down, no  pony-tails, buns, or any metal hair accessories. You may wear deodorant.  Do not shave any body area within 24 hours of your surgery.  Please follow all instructions to avoid any potential surgical cancellation.  Should your physical condition change, (i.e. fever, cold, flu, etc.) please notify your surgeon as soon as possible.  It is important to be on time. If a situation occurs where you may be delayed, please call:  [phone] / [phone] on the day of surgery.  The Preadmission Testing staff can be reached at [phone].  Special instructions: FOLLOW ALL INSTRUCTIONS FROM DR. [facility] OFFICE       Eating and Drinking Before Surgery    You may eat a regular dinner at the usual time on the day before your surgery. If your surgery requires a bowel prep, follow prep instructions given to you by your surgeon.  Do NOT eat any solid foods after midnight.  You may drink clear liquids only from 12 midnight until 1 hour prior to your arrival time at the hospital on the day of your surgery. Do NOT drink alcohol.  Clear liquids include:  Water  Fruit juices without pulp( i.e. apple juice)  Carbonated beverages  Black coffee (no cream/milk)  Tea (no cream/milk)  Gatorade  You may drink up to 12-16 ounces  at one time every 4 hours between the hours of midnight and 1 hour before your arrival time at the hospital. Example- if your arrival time at the hospital is 6am, you may drink 12-16 ounces of clear liquids no later than 5am.  If you have any questions, please contact your surgeon's office.     No current facility-administered medications for this encounter.     Current Outpatient Medications   Medication Sig    carvedilol (COREG) 6.25 MG tablet Take 18.25 mg by mouth 2 times daily (with meals)    TURMERIC PO Take by mouth daily    acetaminophen (TYLENOL) 500 MG tablet Take 2 tablets by mouth in the morning and 2 tablets at noon and 2 tablets in the evening and 2 tablets before bedtime.    amLODIPine  (NORVASC) 10 MG tablet Take 1 tablet by mouth every evening    Cholecalciferol 50 MCG (2000 UT) TABS Take 2.5 tablets by mouth daily    empagliflozin (JARDIANCE) 25 MG tablet Take 1 tablet by mouth daily    gabapentin (NEURONTIN) 300 MG capsule Take 1 capsule by mouth 2 times daily.    insulin aspart (NOVOLOG) 100 UNIT/ML injection pen Inject into the skin 3 times daily (before meals)    insulin glargine (LANTUS SOLOSTAR) 100 UNIT/ML injection pen Inject 52 Units into the skin nightly    ipratropium (ATROVENT) 0.06 % nasal spray 2 sprays by Nasal route 2 times daily    lisinopril (PRINIVIL;ZESTRIL) 40 MG tablet Take 1 tablet by mouth daily    metFORMIN (GLUCOPHAGE) 850 MG tablet Take 1 tablet by mouth 2 times daily (with meals)    pantoprazole (PROTONIX) 40 MG tablet Take 1 tablet by mouth 2 times daily    rosuvastatin (CRESTOR) 10 MG tablet Take 1 tablet by mouth nightly    valACYclovir (VALTREX) 1 g tablet Take 1 tablet by mouth daily (Patient not taking: Reported on 10/14/2022)    zolpidem (AMBIEN) 10 MG tablet Take by mouth.         YOU MUST ONLY TAKE THESE MEDICATIONS THE MORNING OF SURGERY: CARVIDELOL (COREG), AMLODIPINE (NORVASC), GABAPENTIN (NEURONTIN), PANTOPRAZOLE (PROTONIX)  MEDICATIONS TO TAKE THE MORNING OF SURGERY ONLY IF NEEDED: TYLENOL  HOLD these prescription medications BEFORE surgery: SEE DR G'S INSTRUCTIONS AS WELL AS SKIP JARDIANCE AND METFORMIN THE MORNING OF SURGERY ONLY, SKIP LISINOPRIL MORNING OF SURGERY ONLY. FOLLOW YOUR PRESCRIBING DOCTOR OR SURGEONS INSTRUCTIONS REGARDING YOUR LANTUS INSULIN THE NIGHT BEFORE SURGERY.   Ask your surgeon/prescribing physician about when/if to STOP taking these medications: AND ANY YOU HAVE QUESTIONS ON.  (If you are currently taking Aspirin, Plavix, Coumadin or any other blood-thinning/anticoagulant medication contact your prescribing physician for instructions).  UNLESS OTHERWISE INSTRUCTED BY SURGEONS OFFICE, PER ANESTHESIAStop all vitamins, herbal  medicines and Aspirin containing products 7 days prior to surgery. Stop any non-steroidal anti-inflammatory drugs (i.e. Ibuprofen, Naproxen, Advil, Aleve) 7 days before surgery. You may take Tylenol.              Incentive Spirometer          Using the incentive spirometer helps expand the small air sacs of your lungs, helps you breathe deeply, and helps improve your lung function.  Use your incentive spirometer twice a day (10 breaths each time) prior to surgery.      How to Use Your Incentive Spirometer:  Hold the incentive spirometer in an upright position.   Breathe out as usual.   Place the mouthpiece in your  mouth and seal your lips tightly around it.   Take a deep breath.  Breathe in slowly and as deeply as possible. Keep the blue flow rate guide between the arrows.   Hold your breath as long as possible. Then exhale slowly and allow the piston to fall to the bottom of the column.   Rest for a few seconds and repeat steps one through five at least 10 times.     Opportunity given to ask and answer questions.      Preventing Infections Before and After - Your Surgery    IMPORTANT INSTRUCTIONS    You play an important role in your health and preparation for surgery. To reduce the germs on your skin you will need to shower with CHG soap (Chorhexidine gluconate 4%) two times before surgery.    CHG soap (Hibiclens, Hex-A-Clens or store brand)    If you do not have a Evan Shepherd appointment before surgery, you may arrange to pick up CHG soap from our office or purchase CHG soap at a pharmacy, grocery or department store.  You need to purchase TWO 4 ounce bottles to use for your 2 showers.    Steps to follow:  Wash your hair with your normal shampoo and your body with regular soap and rinse well to remove shampoo and soap from your skin.  Wet a clean washcloth and turn off the shower.  Put CHG soap on washcloth and apply to your entire body from the neck down. Do not use on your head, face or private parts(genitals). Do not  use CHG soap on open sores, wounds or areas of skin irritation.  Wash you body gently for 5 minutes. Do not wash your skin too hard. This soap does not create lather. Pay special attention to your underarms and from your belly button to your feet.  Turn the shower back on and rinse well to get CHG soap off your body.  Evan Shepherd your skin dry with a clean, dry towel. Do not apply lotions or moisturizer.  Put on clean clothes and sleep on fresh bed sheets and do not allow pets to sleep with you.    Shower with CHG soap 2 times before your surgery  The evening before your surgery  The morning of your surgery      Tips to help prevent infections after your surgery:  Protect your surgical wound from germs:  Hand washing is the most important thing you and your caregivers can do to prevent infections.  Keep your bandage clean and dry!  Do not touch your surgical wound.  Use clean, freshly washed towels and washcloths every time you shower; do not share bath linens with others.  Until your surgical wound is healed, wear clothing and sleep on bed linens each day that are clean and freshly washed.  Do not allow pets to sleep in your bed with you or touch your surgical wound.  Do not smoke - smoking delays wound healing. This may be a good time to stop smoking.  If you have diabetes, it is important for you to manage your blood sugar levels properly before your surgery as well as after your surgery. Poorly managed blood sugar levels slow down wound healing and prevent you from healing completely.        Day of Procedure    Please park in the parking deck or any designated visitor parking lot.  Enter the facility through the Main Entrance of the hospital.  On the day  of surgery, please provide the cell phone number of the person who will be waiting for you to the Patient Access representative at the time of registration.  Masks are highly recommended in the hospital, but not required.  Once your procedure and the immediate recovery  period is completed, a nurse in the recovery area will contact your designated visitor to inform them of your room number or to otherwise review other pertinent information regarding your care.    Social distancing practices are strongly encouraged in waiting areas and the cafeteria.       The patient was contacted via telephone.   He verbalized understanding of all instructions does not need reinforcement.

## 2022-10-16 ENCOUNTER — Inpatient Hospital Stay
Admit: 2022-10-16 | Discharge: 2022-10-23 | Disposition: A | Discharge status: Home / Self Care | Payer: MEDICARE | Attending: Colon & Rectal Surgery | Admitting: Colon & Rectal Surgery

## 2022-10-16 DIAGNOSIS — K9403 Colostomy malfunction: Principal | ICD-10-CM

## 2022-10-16 DIAGNOSIS — G8918 Other acute postprocedural pain: Secondary | ICD-10-CM

## 2022-10-16 DIAGNOSIS — R918 Other nonspecific abnormal finding of lung field: Secondary | ICD-10-CM | POA: Diagnosis not present

## 2022-10-16 DIAGNOSIS — N181 Chronic kidney disease, stage 1: Secondary | ICD-10-CM | POA: Diagnosis not present

## 2022-10-16 DIAGNOSIS — K469 Unspecified abdominal hernia without obstruction or gangrene: Secondary | ICD-10-CM | POA: Diagnosis not present

## 2022-10-16 DIAGNOSIS — B370 Candidal stomatitis: Secondary | ICD-10-CM | POA: Diagnosis not present

## 2022-10-16 DIAGNOSIS — R059 Cough, unspecified: Secondary | ICD-10-CM | POA: Diagnosis not present

## 2022-10-16 DIAGNOSIS — E1122 Type 2 diabetes mellitus with diabetic chronic kidney disease: Secondary | ICD-10-CM | POA: Diagnosis not present

## 2022-10-16 DIAGNOSIS — K6289 Other specified diseases of anus and rectum: Secondary | ICD-10-CM | POA: Diagnosis not present

## 2022-10-16 DIAGNOSIS — K660 Peritoneal adhesions (postprocedural) (postinfection): Secondary | ICD-10-CM | POA: Diagnosis not present

## 2022-10-16 DIAGNOSIS — I129 Hypertensive chronic kidney disease with stage 1 through stage 4 chronic kidney disease, or unspecified chronic kidney disease: Secondary | ICD-10-CM | POA: Diagnosis not present

## 2022-10-16 DIAGNOSIS — Z8719 Personal history of other diseases of the digestive system: Secondary | ICD-10-CM | POA: Diagnosis not present

## 2022-10-16 DIAGNOSIS — K435 Parastomal hernia without obstruction or  gangrene: Secondary | ICD-10-CM | POA: Diagnosis not present

## 2022-10-16 DIAGNOSIS — Z933 Colostomy status: Secondary | ICD-10-CM | POA: Diagnosis not present

## 2022-10-16 DIAGNOSIS — K529 Noninfective gastroenteritis and colitis, unspecified: Secondary | ICD-10-CM | POA: Diagnosis not present

## 2022-10-16 LAB — CBC
Hematocrit: 38.4 % (ref 36.6–50.3)
Hemoglobin: 11.5 g/dL — ABNORMAL LOW (ref 12.1–17.0)
MCH: 21.7 PG — ABNORMAL LOW (ref 26.0–34.0)
MCHC: 29.9 g/dL — ABNORMAL LOW (ref 30.0–36.5)
MCV: 72.5 FL — ABNORMAL LOW (ref 80.0–99.0)
MPV: 10.7 FL (ref 8.9–12.9)
Nucleated RBCs: 0 PER 100 WBC
Platelets: 256 10*3/uL (ref 150–400)
RBC: 5.3 M/uL (ref 4.10–5.70)
RDW: 16.6 % — ABNORMAL HIGH (ref 11.5–14.5)
WBC: 5.7 10*3/uL (ref 4.1–11.1)
nRBC: 0 10*3/uL (ref 0.00–0.01)

## 2022-10-16 LAB — POCT GLUCOSE: POC Glucose: 252 mg/dL — ABNORMAL HIGH (ref 65–117)

## 2022-10-16 LAB — BASIC METABOLIC PANEL
Anion Gap: 7 mmol/L (ref 5–15)
BUN: 14 MG/DL (ref 6–20)
Bun/Cre Ratio: 16 (ref 12–20)
CO2: 25 mmol/L (ref 21–32)
Calcium: 8.6 MG/DL (ref 8.5–10.1)
Chloride: 105 mmol/L (ref 97–108)
Creatinine: 0.88 MG/DL (ref 0.70–1.30)
Est, Glom Filt Rate: >60 mL/min/{1.73_m2} (ref >60)
Glucose: 259 mg/dL — ABNORMAL HIGH (ref 65–100)
Potassium: 4.2 mmol/L (ref 3.5–5.1)
Sodium: 137 mmol/L (ref 136–145)

## 2022-10-16 MED ORDER — FENTANYL CITRATE (PF) 100 MCG/2ML IJ SOLN
100 MCG/2ML | INTRAMUSCULAR | Status: AC
Start: 2022-10-16 — End: ?

## 2022-10-16 MED ORDER — ROCURONIUM BROMIDE 50 MG/5ML IV SOLN
50 MG/5ML | INTRAVENOUS | Status: AC
Start: 2022-10-16 — End: ?

## 2022-10-16 MED ORDER — MIDAZOLAM HCL 2 MG/2ML IJ SOLN
2 MG/ML | INTRAMUSCULAR | Status: DC | PRN
Start: 2022-10-16 — End: 2022-10-16
  Administered 2022-10-16: 17:00:00 2 via INTRAVENOUS

## 2022-10-16 MED ORDER — CEFAZOLIN SODIUM 1 G IJ SOLR
1 g | INTRAMUSCULAR | Status: DC | PRN
Start: 2022-10-16 — End: 2022-10-16

## 2022-10-16 MED ORDER — PHENYLEPHRINE HCL (PRESSORS) 0.4 MG/10ML IV SOSY
0.4 MG/10ML | INTRAVENOUS | Status: DC | PRN
Start: 2022-10-16 — End: 2022-10-16
  Administered 2022-10-16: 17:00:00 80 via INTRAVENOUS
  Administered 2022-10-16: 17:00:00 120 via INTRAVENOUS
  Administered 2022-10-16: 17:00:00 80 via INTRAVENOUS
  Administered 2022-10-16: 17:00:00 120 via INTRAVENOUS

## 2022-10-16 MED ORDER — MAGNESIUM SULFATE 2 GM/50ML IV SOLN
2 GM/50ML | INTRAVENOUS | Status: DC | PRN
Start: 2022-10-16 — End: 2022-10-16
  Administered 2022-10-16: 18:00:00 2000 via INTRAVENOUS

## 2022-10-16 MED ORDER — PROPOFOL 100 MG/10ML IV EMUL
100 MG/10ML | INTRAVENOUS | Status: DC | PRN
Start: 2022-10-16 — End: 2022-10-16
  Administered 2022-10-16: 17:00:00 200 via INTRAVENOUS

## 2022-10-16 MED ORDER — GLYCOPYRROLATE PF 0.4 MG/2ML IJ SOSY
0.4 MG/2ML | INTRAMUSCULAR | Status: AC
Start: 2022-10-16 — End: ?

## 2022-10-16 MED ORDER — SUCCINYLCHOLINE CHLORIDE 200 MG/10ML IV SOSY
200 MG/10ML | INTRAVENOUS | Status: DC | PRN
Start: 2022-10-16 — End: 2022-10-16
  Administered 2022-10-16: 17:00:00 160 via INTRAVENOUS

## 2022-10-16 MED ORDER — LIDOCAINE HCL (PF) 2 % IJ SOLN
2 % | INTRAMUSCULAR | Status: DC | PRN
Start: 2022-10-16 — End: 2022-10-16
  Administered 2022-10-16: 17:00:00 80 via INTRAVENOUS

## 2022-10-16 MED ORDER — NORMAL SALINE FLUSH 0.9 % IV SOLN
0.9 % | Freq: Two times a day (BID) | INTRAVENOUS | Status: DC
Start: 2022-10-16 — End: 2022-10-16

## 2022-10-16 MED ORDER — EPHEDRINE SULFATE (PRESSORS) 50 MG/ML IV SOLN
50 MG/ML | INTRAVENOUS | Status: DC | PRN
Start: 2022-10-16 — End: 2022-10-16
  Administered 2022-10-16 (×2): 10 via INTRAVENOUS

## 2022-10-16 MED ORDER — CELECOXIB 100 MG PO CAPS
100 MG | Freq: Once | ORAL | Status: AC
Start: 2022-10-16 — End: 2022-10-16
  Administered 2022-10-16: 16:00:00 100 mg via ORAL

## 2022-10-16 MED ORDER — ROCURONIUM BROMIDE 50 MG/5ML IV SOLN
50 MG/5ML | INTRAVENOUS | Status: DC | PRN
Start: 2022-10-16 — End: 2022-10-16
  Administered 2022-10-16: 18:00:00 20 via INTRAVENOUS
  Administered 2022-10-16: 18:00:00 25 via INTRAVENOUS
  Administered 2022-10-16: 20:00:00 20 via INTRAVENOUS
  Administered 2022-10-16: 17:00:00 50 via INTRAVENOUS
  Administered 2022-10-16: 22:00:00 10 via INTRAVENOUS
  Administered 2022-10-16 (×3): 20 via INTRAVENOUS

## 2022-10-16 MED ORDER — ONDANSETRON HCL 4 MG/2ML IJ SOLN
4 MG/2ML | INTRAMUSCULAR | Status: DC | PRN
Start: 2022-10-16 — End: 2022-10-16
  Administered 2022-10-16 (×2): 4 via INTRAVENOUS

## 2022-10-16 MED ORDER — METRONIDAZOLE 500 MG/100ML IV SOLN
500 MG/100ML | INTRAVENOUS | Status: AC
Start: 2022-10-16 — End: 2022-10-16
  Administered 2022-10-16: 17:00:00 500 mg via INTRAVENOUS

## 2022-10-16 MED ORDER — ACETAMINOPHEN 500 MG PO TABS
500 MG | Freq: Once | ORAL | Status: AC
Start: 2022-10-16 — End: 2022-10-16
  Administered 2022-10-16: 16:00:00 1000 mg via ORAL

## 2022-10-16 MED ORDER — BUPIVACAINE HCL (PF) 0.25 % IJ SOLN
0.25 % | INTRAMUSCULAR | Status: AC
Start: 2022-10-16 — End: ?

## 2022-10-16 MED ORDER — HYDROMORPHONE (PF) 10 MCG/ML-BUPIVACAINE 0.0625% NS EPIDURAL
Status: DC
Start: 2022-10-16 — End: 2022-10-19
  Administered 2022-10-17: 02:00:00 via EPIDURAL
  Administered 2022-10-18: 8 via EPIDURAL

## 2022-10-16 MED ORDER — ONDANSETRON HCL 4 MG/2ML IJ SOLN
42 MG/2ML | Freq: Four times a day (QID) | INTRAMUSCULAR | Status: AC | PRN
Start: 2022-10-16 — End: 2022-10-19
  Administered 2022-10-17 – 2022-10-19 (×2): 4 mg via INTRAVENOUS

## 2022-10-16 MED ORDER — FENTANYL CITRATE (PF) 100 MCG/2ML IJ SOLN
100 MCG/2ML | Freq: Once | INTRAMUSCULAR | Status: AC | PRN
Start: 2022-10-16 — End: 2022-10-16
  Administered 2022-10-16: 17:00:00 100 ug via INTRAVENOUS

## 2022-10-16 MED ORDER — MIDAZOLAM HCL (PF) 2 MG/2ML IJ SOLN
2 MG/ML | Freq: Once | INTRAMUSCULAR | Status: AC | PRN
Start: 2022-10-16 — End: 2022-10-16
  Administered 2022-10-16: 17:00:00 2 mg via INTRAVENOUS

## 2022-10-16 MED ORDER — NORMAL SALINE FLUSH 0.9 % IV SOLN
0.9 % | INTRAVENOUS | Status: DC | PRN
Start: 2022-10-16 — End: 2022-10-16

## 2022-10-16 MED ORDER — ALBUMIN HUMAN 5 % IV SOLN
5 % | INTRAVENOUS | Status: DC | PRN
Start: 2022-10-16 — End: 2022-10-16
  Administered 2022-10-16: 17:00:00 12.5 via INTRAVENOUS

## 2022-10-16 MED ORDER — LIDOCAINE HCL (PF) 1 % IJ SOLN
1 % | Freq: Once | INTRAMUSCULAR | Status: DC | PRN
Start: 2022-10-16 — End: 2022-10-16

## 2022-10-16 MED ORDER — PHENYLEPHRINE HCL 10 MG/ML SOLN (MIXTURES ONLY)
10 MG/ML | INTRAVENOUS | Status: DC | PRN
Start: 2022-10-16 — End: 2022-10-16
  Administered 2022-10-16: 17:00:00 40 via INTRAVENOUS

## 2022-10-16 MED ORDER — STERILE WATER FOR INJECTION (MIXTURES ONLY)
1 g | INTRAMUSCULAR | Status: AC
Start: 2022-10-16 — End: 2022-10-16
  Administered 2022-10-16 (×2): 2000 mg via INTRAVENOUS

## 2022-10-16 MED ORDER — CEFAZOLIN SODIUM 1 G IJ SOLR
1 g | INTRAMUSCULAR | Status: AC
Start: 2022-10-16 — End: ?

## 2022-10-16 MED ORDER — SODIUM CHLORIDE 0.9 % IV SOLN
0.9 % | INTRAVENOUS | Status: DC | PRN
Start: 2022-10-16 — End: 2022-10-16

## 2022-10-16 MED ORDER — GLYCOPYRROLATE 0.2 MG/ML IJ SOLN
0.2 MG/ML | INTRAMUSCULAR | Status: DC | PRN
Start: 2022-10-16 — End: 2022-10-16
  Administered 2022-10-16: 17:00:00 .2 via INTRAVENOUS

## 2022-10-16 MED ORDER — BUPIVACAINE HCL (PF) 0.25 % IJ SOLN
0.25 % | INTRAMUSCULAR | Status: DC | PRN
Start: 2022-10-16 — End: 2022-10-16
  Administered 2022-10-16: 23:00:00 6.25 via EPIDURAL
  Administered 2022-10-16: 17:00:00 10 via EPIDURAL
  Administered 2022-10-16 – 2022-10-17 (×6): 6.25 via EPIDURAL

## 2022-10-16 MED ORDER — EPHEDRINE SULFATE (PRESSORS) 50 MG/ML IV SOLN
50 MG/ML | INTRAVENOUS | Status: AC
Start: 2022-10-16 — End: ?

## 2022-10-16 MED ORDER — KETAMINE HCL 50 MG/5ML IJ SOSY
50 MG/5ML | INTRAMUSCULAR | Status: DC | PRN
Start: 2022-10-16 — End: 2022-10-16
  Administered 2022-10-16 (×2): 10 via INTRAVENOUS

## 2022-10-16 MED ORDER — KETAMINE HCL 50 MG/5ML IJ SOSY
50 MG/5ML | INTRAMUSCULAR | Status: AC
Start: 2022-10-16 — End: ?

## 2022-10-16 MED ORDER — FENTANYL CITRATE (PF) 100 MCG/2ML IJ SOLN
100 MCG/2ML | INTRAMUSCULAR | Status: DC | PRN
Start: 2022-10-16 — End: 2022-10-16
  Administered 2022-10-16 (×2): 50 via INTRAVENOUS

## 2022-10-16 MED ORDER — MIDAZOLAM HCL 2 MG/2ML IJ SOLN
2 MG/ML | INTRAMUSCULAR | Status: AC
Start: 2022-10-16 — End: ?

## 2022-10-16 MED ORDER — NALOXEGOL OXALATE 25 MG PO TABS
25 MG | Freq: Once | ORAL | Status: AC
Start: 2022-10-16 — End: 2022-10-16
  Administered 2022-10-16: 16:00:00 25 mg via ORAL

## 2022-10-16 MED ORDER — SODIUM CHLORIDE (PF) 0.9 % IJ SOLN
0.9 % | INTRAMUSCULAR | Status: AC
Start: 2022-10-16 — End: ?

## 2022-10-16 MED ORDER — NALOXONE HCL 0.4 MG/ML IJ SOLN
0.4 MG/ML | INTRAMUSCULAR | Status: AC | PRN
Start: 2022-10-16 — End: 2022-10-19

## 2022-10-16 MED ORDER — NEOSTIGMINE METHYLSULFATE 3 MG/3ML IV SOSY
3 MG/ML | INTRAVENOUS | Status: AC
Start: 2022-10-16 — End: ?

## 2022-10-16 MED ORDER — LACTATED RINGERS IV SOLN
INTRAVENOUS | Status: DC
Start: 2022-10-16 — End: 2022-10-16
  Administered 2022-10-16 (×4): via INTRAVENOUS

## 2022-10-16 MED ORDER — PHENYLEPHRINE HCL (PRESSORS) 10 MG/ML IV SOLN
10 MG/ML | INTRAVENOUS | Status: AC
Start: 2022-10-16 — End: ?

## 2022-10-16 MED ORDER — DEXAMETHASONE 4 MG/ML IJ SOLN (MIXTURES ONLY)
4 MG/ML | INTRAMUSCULAR | Status: DC | PRN
Start: 2022-10-16 — End: 2022-10-16
  Administered 2022-10-16: 17:00:00 8 via INTRAVENOUS

## 2022-10-16 MED FILL — SODIUM CHLORIDE (PF) 0.9 % IJ SOLN: 0.9 % | INTRAMUSCULAR | Qty: 10

## 2022-10-16 MED FILL — BUPIVACAINE HCL (PF) 0.25 % IJ SOLN: 0.25 % | INTRAMUSCULAR | Qty: 30

## 2022-10-16 MED FILL — METRONIDAZOLE 500 MG/100ML IV SOLN: 500 MG/100ML | INTRAVENOUS | Qty: 100

## 2022-10-16 MED FILL — NORMAL SALINE FLUSH 0.9 % IV SOLN: 0.9 % | INTRAVENOUS | Qty: 40

## 2022-10-16 MED FILL — MIDAZOLAM HCL 2 MG/2ML IJ SOLN: 2 MG/ML | INTRAMUSCULAR | Qty: 2

## 2022-10-16 MED FILL — CEFAZOLIN SODIUM 1 G IJ SOLR: 1 g | INTRAMUSCULAR | Qty: 2000

## 2022-10-16 MED FILL — ROCURONIUM BROMIDE 50 MG/5ML IV SOLN: 50 MG/5ML | INTRAVENOUS | Qty: 5

## 2022-10-16 MED FILL — FENTANYL CITRATE (PF) 100 MCG/2ML IJ SOLN: 100 MCG/2ML | INTRAMUSCULAR | Qty: 2

## 2022-10-16 MED FILL — KETAMINE HCL 50 MG/5ML IJ SOSY: 50 MG/5ML | INTRAMUSCULAR | Qty: 5

## 2022-10-16 MED FILL — CEFAZOLIN SODIUM 1 G IJ SOLR: 1 g | INTRAMUSCULAR | Qty: 1000

## 2022-10-16 MED FILL — MOVANTIK 25 MG PO TABS: 25 MG | ORAL | Qty: 1

## 2022-10-16 MED FILL — PHENYLEPHRINE HCL (PRESSORS) 10 MG/ML IV SOLN: 10 MG/ML | INTRAVENOUS | Qty: 1

## 2022-10-16 MED FILL — ACETAMINOPHEN EXTRA STRENGTH 500 MG PO TABS: 500 MG | ORAL | Qty: 2

## 2022-10-16 MED FILL — GLYCOPYRROLATE PF 0.4 MG/2ML IJ SOSY: 0.4 MG/2ML | INTRAMUSCULAR | Qty: 2

## 2022-10-16 MED FILL — EPHEDRINE SULFATE (PRESSORS) 50 MG/ML IV SOLN: 50 MG/ML | INTRAVENOUS | Qty: 1

## 2022-10-16 MED FILL — NEOSTIGMINE METHYLSULFATE 3 MG/3ML IV SOSY: 3 MG/ML | INTRAVENOUS | Qty: 3

## 2022-10-16 MED FILL — CELECOXIB 100 MG PO CAPS: 100 MG | ORAL | Qty: 1

## 2022-10-16 MED FILL — LACTATED RINGERS IV SOLN: INTRAVENOUS | Qty: 1000

## 2022-10-16 NOTE — Op Note (Signed)
To be dictated.

## 2022-10-16 NOTE — Other (Signed)
10/16/22 1605   Family Communication   Contact Person Relationship to Patient Spouse   Family/Significant Other Update Called   Delivery Origin Surgeon   Message Disposition Family present - message delivered   Update Given Yes   Family Communication   Family Update Message Will update in 1-2 hours

## 2022-10-16 NOTE — Other (Signed)
TRANSFER - OUT REPORT:    Verbal report given to Emma, RN(name) on Evan Shepherd  being transferred to 516(unit) for routine progression of patient care       Report consisted of patient's Situation, Background, Assessment and   Recommendations(SBAR).     Time Pre op antibiotic given:1725  Anesthesia Stop time: 2052    Information from the following report(s) OR Summary, Procedure Summary, Intake/Output, MAR, and Recent Results was reviewed with the receiving nurse.    Opportunity for questions and clarification was provided.     Is the patient on 02? Yes       L/Min 3           Is the patient on a monitor? No    Is the nurse transporting with the patient? Yes    Surgical Waiting Area notified of patient's transfer from PACU? Yes

## 2022-10-16 NOTE — Anesthesia Post-Procedure Evaluation (Signed)
Department of Anesthesiology  Postprocedure Note    Patient: Evan Shepherd  MRN: [id number]  Birthdate: April 16, 1956  Date of evaluation: 10/16/2022      Procedure Summary     Date: 10/16/22 Room / Location: [facility] MAIN OR 20 / [facility] MAIN OR    Anesthesia Start: 1250 Anesthesia Stop: 2052    Procedures:       OPEN LYSIS OF ADHESIONS, EXCISION OF COLOANAL J POUCH, COLOSTOMY REVISION WITH REPAIR OF PARASTOMAL HERNIA  WITH PARIETENE DS MESH  (GEN/EPIDURAL) (E R A S) (Abdomen)      CYSTOSCOPY WITH INSERTION BILATERAL URETERAL CATHETERS (Bilateral: Ureter) Diagnosis:       Parastomal hernia without obstruction or gangrene      Proctitis      (Parastomal hernia without obstruction or gangrene [K43.5])      (Proctitis [K62.89])    Providers: Olena Leatherwood, MD; Eschenroeder, Chriss Driver, MD Responsible Provider: Meriam Sprague, DO    Anesthesia Type: General ASA Status: 2          Anesthesia Type: General    Aldrete Phase I: Aldrete Score: 9    Aldrete Phase II:        Anesthesia Post Evaluation    Patient location during evaluation: PACU  Patient participation: complete - patient participated  Level of consciousness: awake  Pain score: 0  Airway patency: patent  Nausea & Vomiting: no nausea  Complications: no  Cardiovascular status: hemodynamically stable  Respiratory status: acceptable  Hydration status: euvolemic  Comments: BP: 134/69 Pulse: 76  Resp: 18 SpO2: 99  Temp: 98.6 F (37 C)      Pain management: adequate

## 2022-10-16 NOTE — Anesthesia Pre-Procedure Evaluation (Signed)
Department of Anesthesiology  Preprocedure Note       Name:  Evan Shepherd   Age:  66 y.o.  DOB:  July 11, 1956                                          MRN:  [id number]         Date:  10/16/2022      Surgeon: Moishe Spice):  Nanda Quinton Worthy Rancher, MD  Eschenroeder, Doy Hutching, MD    Procedure: Procedure(s):  OPEN LYSIS OF ADHESIONS, COLOSTOMY REVISION, REPAIR OF PARACOLOSTOMY HERNIA AND POSSIBLE EXCISION OF COLOANAL J POUCH (GEN/EPIDURAL) (E R A S)  CYSTOSCOPY WITH INSERTION BILATERAL URETERAL CATHETERS    Medications prior to admission:   Prior to Admission medications    Medication Sig Start Date End Date Taking? Authorizing Provider   carvedilol (COREG) 6.25 MG tablet Take 18.25 mg by mouth 2 times daily (with meals)   Yes [provider]   TURMERIC PO Take by mouth daily    Automatic Reconciliation, Ar   acetaminophen (TYLENOL) 500 MG tablet Take 2 tablets by mouth every 6 hours 12/10/21   Automatic Reconciliation, Ar   amLODIPine (NORVASC) 10 MG tablet Take 1 tablet by mouth every evening    Automatic Reconciliation, Ar   Cholecalciferol 50 MCG (2000 UT) TABS Take 2.5 tablets by mouth daily    Automatic Reconciliation, Ar   empagliflozin (JARDIANCE) 25 MG tablet Take 1 tablet by mouth daily    Automatic Reconciliation, Ar   gabapentin (NEURONTIN) 300 MG capsule Take 1 capsule by mouth 2 times daily.    Automatic Reconciliation, Ar   insulin aspart (NOVOLOG) 100 UNIT/ML injection pen Inject into the skin 3 times daily (before meals)    Automatic Reconciliation, Ar   insulin glargine (LANTUS SOLOSTAR) 100 UNIT/ML injection pen Inject 52 Units into the skin nightly    Automatic Reconciliation, Ar   ipratropium (ATROVENT) 0.06 % nasal spray 2 sprays by Nasal route 2 times daily    Automatic Reconciliation, Ar   lisinopril (PRINIVIL;ZESTRIL) 40 MG tablet Take 1 tablet by mouth daily    Automatic Reconciliation, Ar   metFORMIN (GLUCOPHAGE) 850 MG tablet Take 1 tablet by mouth 2 times daily (with meals)     Automatic Reconciliation, Ar   pantoprazole (PROTONIX) 40 MG tablet Take 1 tablet by mouth 2 times daily    Automatic Reconciliation, Ar   rosuvastatin (CRESTOR) 10 MG tablet Take 1 tablet by mouth nightly    Automatic Reconciliation, Ar   valACYclovir (VALTREX) 1 g tablet Take 1 tablet by mouth daily  Patient not taking: Reported on 10/14/2022    Automatic Reconciliation, Ar   zolpidem (AMBIEN) 10 MG tablet Take by mouth.    Automatic Reconciliation, Ar       Current medications:    Current Facility-Administered Medications   Medication Dose Route Frequency Provider Last Rate Last Admin   . lidocaine PF 1 % injection 1 mL  1 mL IntraDERmal Once PRN Teresita Madura., MD       . lactated ringers IV soln infusion   IntraVENous Continuous Teresita Madura., MD 125 mL/hr at 10/16/22 1250 NoRateChange at 10/16/22 1250   . sodium chloride flush 0.9 % injection 5-40 mL  5-40 mL IntraVENous 2 times per day Teresita Madura., MD       . sodium chloride flush  0.9 % injection 5-40 mL  5-40 mL IntraVENous PRN Teresita Madura., MD       . 0.9 % sodium chloride infusion   IntraVENous PRN Teresita Madura., MD       . midazolam PF (VERSED) injection 2 mg  2 mg IntraVENous Once PRN Teresita Madura., MD       . sodium chloride flush 0.9 % injection 5-40 mL  5-40 mL IntraVENous 2 times per day Theresia Majors, MD       . sodium chloride flush 0.9 % injection 5-40 mL  5-40 mL IntraVENous PRN Ghaemmaghami, Worthy Rancher, MD       . ceFAZolin (ANCEF) 2,000 mg in sterile water 20 mL IV syringe  2,000 mg IntraVENous On Call to OR Theresia Majors, MD        And   . metronidazole (FLAGYL) 500 mg in 0.9% NaCl 100 mL IVPB premix  500 mg IntraVENous On Call to OR Theresia Majors, MD           Allergies:    Allergies   Allergen Reactions   . Pork-Derived Products        Problem List:    Patient Active Problem List   Diagnosis Code   . Fecal incontinence R15.9   . Type II diabetes mellitus (HCC) E11.9   .  Hypertension I10   . 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 Intra-abdominal adhesions K66.0       Past Medical History:        Diagnosis Date   . Chronic kidney disease, stage I    . Dyslipidemia    . Erectile dysfunction    . Fecal incontinence    . GERD (gastroesophageal reflux disease)    . History of colonic polyps    . History of rectal cancer 2006    Treated with neoadjuvant chemoradiation followed by surgery and then, presumably, adjuvant chemotherapy. The patient appears to have been cured.   Marland Kitchen History of small bowel obstruction     Multiple episodes treated non-operatively   . Hypertension    . S/P ablation of ventricular arrhythmia 09/26/2022   . Type II diabetes mellitus (HCC)        Past Surgical History:        Procedure Laterality Date   . CARDIAC ELECTROPHYSIOLOGY MAPPING AND ABLATION  09/26/2022    [facility], [place], [place].   Marland Kitchen COLONOSCOPY  2006   . COLONOSCOPY  11/28/2020    Tressia Danas, M.D. (in [place]) ? Four polypoid lesions were excised. Two proved to have been tubular adenomas and two were lymphoid aggregates. A five-year follow-up interval was recommended.   . COLOSTOMY     . HEENT Right 2018    CATARACT SURGERY   . IR PORT PLACEMENT EQUAL OR GREATER THAN 5 YEARS  2006   . OTHER SURGICAL HISTORY  12/03/2021    Hand-assisted laparoscopic creation of divided loop colostomy; Dr. Nanda Quinton.   . OTHER SURGICAL HISTORY  10/29/2005    Intersphincteric proctectomy with colonic J-pouch to anal anastomosis and diverting loop ileostomy Aleatha Borer, M.D., [facility])   . OTHER SURGICAL HISTORY  06/22/2006    Closure of loop ileostomy and removal of subcutaneous infusion port Aleatha Borer, M.D., [facility])   . OTHER SURGICAL HISTORY  11/01/2018    ?Hemorrhoidectomy? Rosalyn Charters, M.D. in [place]) ? This operation was really the excision of prolapsing colonic J pouch mucosa using  the Ligasure and closure of the defects with 3-0 Vicryl sutures.   . OTHER SURGICAL HISTORY   04/08/2022    Proctoplasty; Dr. Nanda Quinton   . PR UNLISTED PROCEDURE ABDOMEN PERITONEUM & OMENTUM  2008    HERNIA REPAIR   . UPPER GASTROINTESTINAL ENDOSCOPY  04/18/2020    EGD; Tressia Danas, MD.   . UROLOGICAL SURGERY  12/03/2021    Cystoscopy and placement of bilateral ureteral catheters; Dennard Nip, MD.   . UROLOGICAL SURGERY  10/29/2009    Insertion of Coloplast three-piece inflatable penile prosthesis (Mark C. Vernie Ammons, M.D.)       Social History:    Social History     Tobacco Use   . Smoking status: Former     Types: Cigars   . Smokeless tobacco: Never   Substance Use Topics   . Alcohol use: Not Currently                                Counseling given: Not Answered      Vital Signs (Current):   Vitals:    10/16/22 1125 10/16/22 1129 10/16/22 1245   BP:  138/67 116/81   Pulse: 68  65   Resp: 16 16 16    Temp:  98.6 F (37 C)    TempSrc:  Oral    SpO2: 98%  96%   Weight: 98.9 kg (218 lb) 97.6 kg (215 lb 2.7 oz)    Height: 1.727 m (5\' 8" )                                                BP Readings from Last 3 Encounters:   10/16/22 116/81       NPO Status: Time of last liquid consumption: 0700                        Time of last solid consumption: 2200                        Date of last liquid consumption: 10/16/22                        Date of last solid food consumption: 10/15/22    BMI:   Wt Readings from Last 3 Encounters:   10/16/22 97.6 kg (215 lb 2.7 oz)     Body mass index is 32.72 kg/m.    CBC:   Lab Results   Component Value Date/Time    WBC 5.7 10/16/2022 11:40 AM    RBC 5.30 10/16/2022 11:40 AM    HGB 11.5 10/16/2022 11:40 AM    HCT 38.4 10/16/2022 11:40 AM    MCV 72.5 10/16/2022 11:40 AM    RDW 16.6 10/16/2022 11:40 AM    PLT 256 10/16/2022 11:40 AM       CMP:   Lab Results   Component Value Date/Time    NA 137 04/09/2022 02:50 AM    K 4.7 04/09/2022 02:50 AM    CL 107 04/09/2022 02:50 AM    CO2 24 04/09/2022 02:50 AM    BUN 17 04/09/2022 02:50 AM    CREATININE 1.18 04/09/2022 02:50 AM     AGRATIO 0.7 12/06/2021 04:43 AM    GLUCOSE 246  04/09/2022 02:50 AM    PROT 7.0 12/06/2021 04:43 AM    CALCIUM 8.8 04/09/2022 02:50 AM    BILITOT 0.6 12/06/2021 04:43 AM    ALKPHOS 56 12/06/2021 04:43 AM    AST 14 12/06/2021 04:43 AM    ALT 20 12/06/2021 04:43 AM       POC Tests:   Recent Labs     10/16/22  1156   POCGLU 252*       Coags: No results found for: "PROTIME", "INR", "APTT"    HCG (If Applicable): No results found for: "PREGTESTUR", "PREGSERUM", "HCG", "HCGQUANT"     ABGs: No results found for: "PHART", "PO2ART", "PCO2ART", "HCO3ART", "BEART", "O2SATART"     Type & Screen (If Applicable):  No results found for: "LABABO", "LABRH"    Drug/Infectious Status (If Applicable):  No results found for: "HIV", "HEPCAB"    COVID-19 Screening (If Applicable): No results found for: "COVID19"        Anesthesia Evaluation  Patient summary reviewed and Nursing notes reviewed  Airway: Mallampati: II  TM distance: >3 FB   Neck ROM: full  Mouth opening: > = 3 FB   Dental: normal exam         Pulmonary:Negative Pulmonary ROS breath sounds clear to auscultation                             Cardiovascular:Negative CV ROS  Exercise tolerance: good (>4 METS),   (+) hypertension:,         Rhythm: regular  Rate: normal                    Neuro/Psych:   Negative Neuro/Psych ROS              GI/Hepatic/Renal: Neg GI/Hepatic/Renal ROS  (+) GERD:,           Endo/Other: Negative Endo/Other ROS   (+) DiabetesType II DM, , .                 Abdominal: normal exam            Vascular: negative vascular ROS.         Other Findings:           Anesthesia Plan      general     ASA 2       Induction: intravenous.    MIPS: Postoperative opioids intended and Prophylactic antiemetics administered.  Anesthetic plan and risks discussed with patient.    Use of blood products discussed with patient whom consented to blood products.   Plan discussed with CRNA.          Post-op pain plan if not by surgeon: continuous epidural            Docia Chuck,  MD   10/16/2022

## 2022-10-16 NOTE — Brief Op Note (Signed)
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 History of adhesive small bowel obstructions [Z87.19]    Procedures:  Open lysis of adhesions, excision of coloanal J-pouch, revision of colostomy and repair of parastomal hernia with Parietene DS mesh  Surgeon:  Olena Leatherwood, MD  Assistants:  Trey Paula, RN; Clent Ridges, SA; Carolee Rota, SA  Anesthesia: General endotracheal and epidural  Specimens:   ID Type Source Tests Collected by Time Destination   1 : Distal colon including colonic J pouch Tissue Colon SURGICAL PATHOLOGY Olena Leatherwood, MD 10/16/2022 1713    2 : Colostomy Tissue Colon SURGICAL PATHOLOGY Olena Leatherwood, MD 10/16/2022 1715    Drains:  None  Estimated Blood Loss:  150 mL  Crystalloid:  2800 mL  Albumin:  250 mL  Urine:  409 mL  Complications:  None apparent  Implants:  Implant Name Type Inv. Item Serial No. Manufacturer Lot No. LRB No. Used Action   MESH HERN RND 20X15 CM COMP PARIETENE DS - SNA  MESH HERN RND 20X15 CM COMP PARIETENE DS NA [employer] COVIDIEN [place] SURGICAL-WD [id number]  1 Implanted   Findings:  There were moderately extensive adhesions involving the small intestine without evidence of obstruction.  The ileoileostomy was healthy and widely patent.  The colon, including the diverted distal segment and the J-pouch, appeared to be healthy.  There was a moderate-sized parastomal hernia containing colonic mesentery.  There was no evidence of malignancy.      This procedure was not performed to treat colon cancer through resection      Electronically signed by Carleene Mains, MD

## 2022-10-16 NOTE — Wound Image (Addendum)
Stoma Marking Note:     Type of ostomy scheduled: alternate colostomy site by Dr. Marge Duncans.    Introduced self and services to patient.  Patient able to verbalize knowledge of procedure consistent with consent.    Stoma site marked with surgical marker in Left lower quadrant (below current colostomy site) for alternate colostomy site and right mid quadrant at site of previous ostomy site, if needed.    Stoma site chosen is in the patient's visual field and within the rectus muscle while avoiding the following: umbilicus, wrinkles, dips, skin folds, rib cage, iliac crest and belt/pants/underwear line.  Site was assessed in the lying and sitting.  Abdominal topography has scarring from previous surgery.    Patient understands that the final location will be determined by the surgeon and the site is only a guideline.    Pt concerns discussed: none    Plan to follow after surgery for teaching. Will likely need home health care for further teaching after discharge.    Lauris Chroman, BSN RN [facility]  [facility]  Available on [employer]  Office [phone]

## 2022-10-16 NOTE — H&P (Signed)
Colorectal Surgery Pre-Operative History and Physical Examination Note          NAME:  Evan Shepherd   DOB:   1956/03/06   MRN:   [id number]     Operation Date: 10/16/2022    Chief Complaint:  Colostomy dysfunction     History of Present Illness:    The patient is a 66 year old male who underwent 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  The ileostomy was closed in 2007, and he had good function for the first one or two years after the operation. Gradually, he experienced increasing problems with fecal incontinence. That incontinence eventually became intractable, and on 12/03/2021 he underwent the hand-assisted laparoscopic creation of a divided loop colostomy. The creation of a colostomy solved the fecal incontinence problem; however, the patient continued to struggle with bleeding and wetness in the anal area secondary to prolapse of the mucosa of the colonic J-pouch.  These symptoms severely decreased his quality of life, so a proctoplasty was performed on 04/08/2022. That procedure helped to a certain degree, but did not completely solve the problems.  Furthermore, he began to experience significant problems with colostomy management.    On 09/08/2022, the patient presented for reevaluation secondary to multiple issues.    His most bothersome problem is that he has pain at the colostomy site that is exacerbated by certain movements. He has had increasing problems with appliance management. There is leakage from the appliance in the inferior part where the mucous fistula portion of the divided loop colostomy creates a curvature that seems to have been causing increasing problems over the last one and a half months. Additionally, a CT scan of the abdomen and pelvis performed on 09/04/2022 was interpreted as revealing parastomal herniation of mesenteric fat and vessels that is increased compared to what was seen on  a CT scan performed on 05/07/2022.    From a proctologic standpoint, he has also continued to have problems with the passage of blood and mucous from the anus. Although the amount of the fluid has decreased, he still has to use adult diapers when outside of the home.    Finally, he has also had multiple emergency room visits and hospitalizations related to intermittent partial small bowel obstruction that is believed to have been adhesive in nature.      PMH:  Past Medical History:   Diagnosis Date    Chronic kidney disease, stage I     Dyslipidemia     Erectile dysfunction     Fecal incontinence     GERD (gastroesophageal reflux disease)     History of colonic polyps     History of rectal cancer 2006    Treated with neoadjuvant chemoradiation followed by surgery and then, presumably, adjuvant chemotherapy. The patient appears to have been cured.    History of small bowel obstruction     Multiple episodes treated non-operatively    Hypertension     S/P ablation of ventricular arrhythmia 09/26/2022    Type II diabetes mellitus (HCC)        PSH:  Past Surgical History:   Procedure Laterality Date    CARDIAC ELECTROPHYSIOLOGY MAPPING AND ABLATION  09/26/2022    [facility], [place], [place].    COLONOSCOPY  2006    COLONOSCOPY  11/28/2020    Tressia Danas, M.D. (in [place]) ? Four polypoid lesions were excised. Two proved to have been tubular adenomas and  two were lymphoid aggregates. A five-year follow-up interval was recommended.    COLOSTOMY      HEENT Right 2018    CATARACT SURGERY    IR PORT PLACEMENT EQUAL OR GREATER THAN 5 YEARS  2006    OTHER SURGICAL HISTORY  12/03/2021    Hand-assisted laparoscopic creation of divided loop colostomy; Dr. Nanda Quinton.    OTHER SURGICAL HISTORY  10/29/2005    Intersphincteric proctectomy with colonic J-pouch to anal anastomosis and diverting loop ileostomy Aleatha Borer, M.D., [facility])    OTHER SURGICAL HISTORY  06/22/2006    Closure of loop ileostomy and removal of  subcutaneous infusion port Aleatha Borer, M.D., [facility])    OTHER SURGICAL HISTORY  11/01/2018    ?Hemorrhoidectomy? Rosalyn Charters, M.D. in [place]) ? This operation was really the excision of prolapsing colonic J pouch mucosa using the Ligasure and closure of the defects with 3-0 Vicryl sutures.    OTHER SURGICAL HISTORY  04/08/2022    Proctoplasty; Dr. Nanda Quinton    PR UNLISTED PROCEDURE ABDOMEN PERITONEUM & OMENTUM  2008    HERNIA REPAIR    UPPER GASTROINTESTINAL ENDOSCOPY  04/18/2020    EGD; Tressia Danas, MD.    UROLOGICAL SURGERY  12/03/2021    Cystoscopy and placement of bilateral ureteral catheters; Dennard Nip, MD.    UROLOGICAL SURGERY  10/29/2009    Insertion of Coloplast three-piece inflatable penile prosthesis (Mark C. Vernie Ammons, M.D.)       Home Medications:  Prior to Admission Medications   Prescriptions Last Dose Informant Patient Reported? Taking?   Cholecalciferol 50 MCG (2000 UT) TABS   Yes No   Sig: Take 2.5 tablets by mouth daily   TURMERIC PO   Yes No   Sig: Take by mouth daily   acetaminophen (TYLENOL) 500 MG tablet   Yes No   Sig: Take 2 tablets by mouth in the morning and 2 tablets at noon and 2 tablets in the evening and 2 tablets before bedtime.   amLODIPine (NORVASC) 10 MG tablet   Yes No   Sig: Take 1 tablet by mouth every evening   carvedilol (COREG) 6.25 MG tablet   Yes Yes   Sig: Take 18.25 mg by mouth 2 times daily (with meals)   empagliflozin (JARDIANCE) 25 MG tablet   Yes No   Sig: Take 1 tablet by mouth daily   gabapentin (NEURONTIN) 300 MG capsule   Yes No   Sig: Take 1 capsule by mouth 2 times daily.   insulin aspart (NOVOLOG) 100 UNIT/ML injection pen   Yes No   Sig: Inject into the skin 3 times daily (before meals)   insulin glargine (LANTUS SOLOSTAR) 100 UNIT/ML injection pen   Yes No   Sig: Inject 52 Units into the skin nightly   ipratropium (ATROVENT) 0.06 % nasal spray   Yes No   Sig: 2 sprays by Nasal route 2 times daily   lisinopril  (PRINIVIL;ZESTRIL) 40 MG tablet   Yes No   Sig: Take 1 tablet by mouth daily   metFORMIN (GLUCOPHAGE) 850 MG tablet   Yes No   Sig: Take 1 tablet by mouth 2 times daily (with meals)   pantoprazole (PROTONIX) 40 MG tablet   Yes No   Sig: Take 1 tablet by mouth 2 times daily   rosuvastatin (CRESTOR) 10 MG tablet   Yes No   Sig: Take 1 tablet by mouth nightly   valACYclovir (VALTREX) 1 g tablet Not Taking  Yes No  Sig: Take 1 tablet by mouth daily   Patient not taking: Reported on 10/14/2022   zolpidem (AMBIEN) 10 MG tablet   Yes No   Sig: Take by mouth.      Facility-Administered Medications: None       Hospital Medications:  No current facility-administered medications for this encounter.       Allergies:  Allergies   Allergen Reactions    Pork-Derived Products        Family History:  Family History   Problem Relation Age of Onset    Hypertension Father     Diabetes Mother     Anesth Problems Neg Hx        Social History:  Social History     Tobacco Use    Smoking status: Former     Types: Cigars    Smokeless tobacco: Never   Substance Use Topics    Alcohol use: Not Currently       Review of Systems:    Symptom Y/N Comments  Symptom Y/N Comments   Fever/Chills N   Chest Pain N    Cough N   Abdominal Pain     Sputum N   Joint Pain     SOB/DOE N   Pruritis/Rash     Nausea/vomit N   Tolerating PT/OT     Diarrhea N   Tolerating Diet Y    Constipation N   Other       Could NOT obtain due to:        Objective:   No data found.  No intake/output data recorded.  No intake/output data recorded.    PHYSICAL EXAMINATION:    There is no scleral icterus.  The lungs are clear to auscultation bilaterally.  The heart rate and rhythm are normal.  The abdomen is soft, non-tender, and non-distended.  There are multiple well-healed scars. There are no palpable masses.  The colostomy appears to be healthy, but there is a significant convexity of the abdomen around the stoma that is not reducible. There is no appreciable organomegaly or  inguinal lymphadenopathy.  The extremities are without edema.  The patient is alert and oriented, and there are no gross neurologic deficits.       Data Review                       Assessment and Plan:      The patient continues to have multiple problems.    One is the ongoing passage of blood and mucous from the anus. Although the severity of this problem has decreased, it is still having a significant adverse effect on his quality of life.    Another problem is the challenging ostomy management that is due at least in part to the increasing herniation of mesentery into the parastomal space and perhaps mostly due to the irregular shape of the stoma itself because of the mucous fistula portion of the divided loop.    The third problem is the recurrent partial small bowel obstructions that are most likely related to intra-abdominal adhesions but could also be secondary to an enteroenteric anastomotic stricture.    I believe that these problems can all be addressed at the same time by performing a laparotomy with lysis of adhesions, resection of the relatively easily accessible part of the coloanal J-pouch, revision of the colostomy so that it is a simple end stoma not a divided loop, and repair of the parastomal hernia.  We have discussed the components of the operation named above in detail. The risks of been discussed, and the patient would like to proceed. It is worth emphasizing that the resection of the segment of colon between the ostomy and anus will be performed in a way intended to minimize the risk of injury to other structures, which means that a short colonic remnant will probably be left in situ.    He understands the risks, and he has agreed to proceed.      Principal Problem:    Colostomy dysfunction ([place])  Active Problems:    Type II diabetes mellitus ([place])    Intra-abdominal adhesions  Resolved Problems:    * No resolved hospital problems. *

## 2022-10-16 NOTE — Other (Signed)
10/16/22 1511   Bowel Bundle Checklist   Wound Protector Used? Yes   Closing instruments isolated and used for closing fascia and skin?  Yes   Sterile glove change by all team members prior to close? Yes   Gown changed by all team members prior to close? Yes

## 2022-10-16 NOTE — Op Note (Signed)
Operative Report    Patient: Evan Shepherd MRN: [id number]  SSN: [id number]    Date of Birth: Jun 10, 1956  Age: 66 y.o.  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 Pump mobile in scrotum. Urethra normal. Prostate with elevated bladder neck and moderate visual obstruction. Bladder with mild trabeculations and mass effect from reservoir on R lateral wall with no mucosal lesions. Uos orthotopic bilaterally.     Procedure in Detail:   The patient was brought to the operating room. A time out was performed by the team to confirm correct patient, site, and procedure. General anesthesia was induced. The patient was then moved into lithotomy position. The patient was prepped and draped in the usual sterile fashion.    Cystourethroscopy was performed with findings as above. The right ureteral orifice was cannulated with a sensor wire. This was easily advanced until resistance was felt in the renal pelvis. A 72fr ureteral catheter was advanced over this and the wire was removed. An identical procedure was performed on the left side. The scope was removed. A 64fr foley catheter was placed and 10cc added to the balloon. The ureteral catheters and foley were secured with a silk tie the adapter to a drainage bag. Care of the patient was then turned over to the primary surgeon.       Estimated Blood Loss:  none    Implants: none           Specimens: * No specimens in log *        Drains: bilateral externalized ureteral stents, 55fr foley catheter                Complications: None    Disposition: the ureteral  catheters and foley will be removed at the discretion of the primary team      Signed By:  Thelma Comp, MD     October 16, 2022

## 2022-10-16 NOTE — Anesthesia Procedure Notes (Signed)
Epidural Block    Patient location during procedure: holding area  Start time: 10/16/2022 12:42 PM  End time: 10/16/2022 12:54 PM  Reason for block: labor epidural  Staffing  Performed: anesthesiologist   Anesthesiologist: Mariane Baumgarten., MD  Performed by: Mariane Baumgarten., MD  Authorized by: Mariane Baumgarten., MD    Epidural  Patient position: sitting  Prep: DuraPrep  Patient monitoring: cardiac monitor, continuous pulse ox and frequent blood pressure checks  Approach: midline  Location: T7-8  Injection technique: LOR air  Provider prep: mask and sterile gloves  Needle  Needle type: Tuohy   Needle gauge: 17 G  Needle length: 3.5 in  Needle insertion depth: 8 cm  Catheter type: end hole  Catheter size: 70 G  Catheter at skin depth: 14 cmCatheter Secured: tegaderm and tape  Assessment  Sensory level: T6  Events: None  Hemodynamics: stable  Attempts: 2  Outcomes: uncomplicated and patient tolerated procedure well  Preanesthetic Checklist  Completed: patient identified, IV checked, site marked, risks and benefits discussed, surgical/procedural consents, equipment checked, pre-op evaluation, timeout performed, anesthesia consent given, oxygen available, monitors applied/VS acknowledged and fire risk safety assessment completed and verbalized

## 2022-10-16 NOTE — Other (Signed)
10/16/22 1349   Family Communication   Contact Person Relationship to Patient Spouse   Contact Person Phone Number Jorrell Kuster [phone]   Family/Significant Other Update Called   Delivery Origin Nurse   Message Disposition Family present - message delivered   Update Given Yes   Family Communication   Family Update Message Procedure started

## 2022-10-17 LAB — POCT GLUCOSE
POC Glucose: 235 mg/dL — ABNORMAL HIGH (ref 65–117)
POC Glucose: 225 mg/dL — ABNORMAL HIGH (ref 65–117)
POC Glucose: 218 mg/dL — ABNORMAL HIGH (ref 65–117)
POC Glucose: 159 mg/dL — ABNORMAL HIGH (ref 65–117)
POC Glucose: 139 mg/dL — ABNORMAL HIGH (ref 65–117)

## 2022-10-17 LAB — CBC
Hematocrit: 40.2 % (ref 36.6–50.3)
Hemoglobin: 12 g/dL — ABNORMAL LOW (ref 12.1–17.0)
MCH: 21.7 PG — ABNORMAL LOW (ref 26.0–34.0)
MCHC: 29.9 g/dL — ABNORMAL LOW (ref 30.0–36.5)
MCV: 72.8 FL — ABNORMAL LOW (ref 80.0–99.0)
MPV: 10.2 FL (ref 8.9–12.9)
Nucleated RBCs: 0 PER 100 WBC
Platelets: 246 10*3/uL (ref 150–400)
RBC: 5.52 M/uL (ref 4.10–5.70)
RDW: 17.2 % — ABNORMAL HIGH (ref 11.5–14.5)
WBC: 10 10*3/uL (ref 4.1–11.1)
nRBC: 0 10*3/uL (ref 0.00–0.01)

## 2022-10-17 LAB — BASIC METABOLIC PANEL
Anion Gap: 7 mmol/L (ref 5–15)
Anion Gap: 8 mmol/L (ref 5–15)
BUN: 18 MG/DL (ref 6–20)
BUN: 18 MG/DL (ref 6–20)
Bun/Cre Ratio: 16 (ref 12–20)
Bun/Cre Ratio: 17 (ref 12–20)
CO2: 22 mmol/L (ref 21–32)
CO2: 22 mmol/L (ref 21–32)
Calcium: 8.4 MG/DL — ABNORMAL LOW (ref 8.5–10.1)
Calcium: 8.8 MG/DL (ref 8.5–10.1)
Chloride: 109 mmol/L — ABNORMAL HIGH (ref 97–108)
Chloride: 108 mmol/L (ref 97–108)
Creatinine: 1.15 MG/DL (ref 0.70–1.30)
Creatinine: 1.03 MG/DL (ref 0.70–1.30)
Est, Glom Filt Rate: >60 mL/min/{1.73_m2} (ref >60)
Est, Glom Filt Rate: >60 mL/min/{1.73_m2} (ref >60)
Glucose: 240 mg/dL — ABNORMAL HIGH (ref 65–100)
Glucose: 231 mg/dL — ABNORMAL HIGH (ref 65–100)
Potassium: 4.8 mmol/L (ref 3.5–5.1)
Potassium: 4.7 mmol/L (ref 3.5–5.1)
Sodium: 138 mmol/L (ref 136–145)
Sodium: 138 mmol/L (ref 136–145)

## 2022-10-17 LAB — CBC WITH AUTO DIFFERENTIAL
Absolute Immature Granulocyte: 0 10*3/uL
Band Neutrophils: 4 % (ref 0–6)
Basophils %: 0 % (ref 0–1)
Basophils Absolute: 0 10*3/uL (ref 0.0–0.1)
Eosinophils %: 0 % (ref 0–7)
Eosinophils Absolute: 0 10*3/uL (ref 0.0–0.4)
Hematocrit: 42.4 % (ref 36.6–50.3)
Hemoglobin: 12.6 g/dL (ref 12.1–17.0)
Immature Granulocytes: 0 %
Lymphocytes %: 12 % (ref 12–49)
Lymphocytes Absolute: 1.2 10*3/uL (ref 0.8–3.5)
MCH: 21.9 PG — ABNORMAL LOW (ref 26.0–34.0)
MCHC: 29.7 g/dL — ABNORMAL LOW (ref 30.0–36.5)
MCV: 73.7 FL — ABNORMAL LOW (ref 80.0–99.0)
MPV: 10.5 FL (ref 8.9–12.9)
Monocytes %: 4 % — ABNORMAL LOW (ref 5–13)
Monocytes Absolute: 0.4 10*3/uL (ref 0.0–1.0)
Neutrophils %: 80 % — ABNORMAL HIGH (ref 32–75)
Neutrophils Absolute: 8.4 10*3/uL — ABNORMAL HIGH (ref 1.8–8.0)
Nucleated RBCs: 0 PER 100 WBC
Platelets: 234 10*3/uL (ref 150–400)
RBC: 5.75 M/uL — ABNORMAL HIGH (ref 4.10–5.70)
RDW: 18 % — ABNORMAL HIGH (ref 11.5–14.5)
WBC: 10 10*3/uL (ref 4.1–11.1)
nRBC: 0 10*3/uL (ref 0.00–0.01)

## 2022-10-17 LAB — TYPE AND SCREEN
ABO/Rh: B POS
Antibody Screen: NEGATIVE

## 2022-10-17 LAB — PHOSPHORUS
Phosphorus: 4.3 MG/DL (ref 2.6–4.7)
Phosphorus: 4.6 MG/DL (ref 2.6–4.7)

## 2022-10-17 LAB — MAGNESIUM: Magnesium: 2 mg/dL (ref 1.6–2.4)

## 2022-10-17 MED ORDER — AMLODIPINE BESYLATE 5 MG PO TABS
5 MG | Freq: Every evening | ORAL | Status: DC
Start: 2022-10-17 — End: 2022-10-23
  Administered 2022-10-17 – 2022-10-22 (×7): 10 mg via ORAL

## 2022-10-17 MED ORDER — LISINOPRIL 20 MG PO TABS
20 MG | Freq: Every day | ORAL | Status: DC
Start: 2022-10-17 — End: 2022-10-23

## 2022-10-17 MED ORDER — OXYCODONE HCL 5 MG PO TABS
5 MG | Freq: Once | ORAL | Status: DC | PRN
Start: 2022-10-17 — End: 2022-10-16

## 2022-10-17 MED ORDER — ONDANSETRON HCL 4 MG/2ML IJ SOLN
4 MG/2ML | Freq: Once | INTRAMUSCULAR | Status: DC | PRN
Start: 2022-10-17 — End: 2022-10-16

## 2022-10-17 MED ORDER — NEOSTIGMINE METHYLSULFATE 3 MG/3ML IV SOSY
3 MG/ML | INTRAVENOUS | Status: DC | PRN
Start: 2022-10-17 — End: 2022-10-16
  Administered 2022-10-17: 3 via INTRAVENOUS

## 2022-10-17 MED ORDER — METFORMIN HCL 850 MG PO TABS
850 MG | Freq: Two times a day (BID) | ORAL | Status: AC
Start: 2022-10-17 — End: 2022-10-23
  Administered 2022-10-21 – 2022-10-22 (×4): 850 mg via ORAL

## 2022-10-17 MED ORDER — HYDRALAZINE HCL 20 MG/ML IJ SOLN
20 MG/ML | INTRAMUSCULAR | Status: DC | PRN
Start: 2022-10-17 — End: 2022-10-16

## 2022-10-17 MED ORDER — DEXTROSE 10 % IV BOLUS
INTRAVENOUS | Status: AC | PRN
Start: 2022-10-17 — End: 2022-10-23

## 2022-10-17 MED ORDER — INSULIN LISPRO 100 UNIT/ML IJ SOLN
100 UNIT/ML | Freq: Every evening | INTRAMUSCULAR | Status: AC
Start: 2022-10-17 — End: 2022-10-20

## 2022-10-17 MED ORDER — LACTATED RINGERS IV SOLN
INTRAVENOUS | Status: DC
Start: 2022-10-17 — End: 2022-10-17
  Administered 2022-10-17: 02:00:00 via INTRAVENOUS

## 2022-10-17 MED ORDER — LACTATED RINGERS IV SOLN
INTRAVENOUS | Status: AC
Start: 2022-10-17 — End: 2022-10-18
  Administered 2022-10-17 – 2022-10-18 (×3): via INTRAVENOUS

## 2022-10-17 MED ORDER — ZOLPIDEM TARTRATE 5 MG PO TABS
5 MG | Freq: Every evening | ORAL | Status: DC
Start: 2022-10-17 — End: 2022-10-21
  Administered 2022-10-18 – 2022-10-21 (×4): 5 mg via ORAL

## 2022-10-17 MED ORDER — IPRATROPIUM BROMIDE 0.06 % NA SOLN
0.06 % | Freq: Two times a day (BID) | NASAL | Status: AC
Start: 2022-10-17 — End: 2022-10-23
  Administered 2022-10-17 – 2022-10-23 (×10): 2 via NASAL

## 2022-10-17 MED ORDER — GABAPENTIN 300 MG PO CAPS
300 MG | Freq: Two times a day (BID) | ORAL | Status: AC
Start: 2022-10-17 — End: 2022-10-23
  Administered 2022-10-17 – 2022-10-23 (×14): 300 mg via ORAL

## 2022-10-17 MED ORDER — GLUCOSE 4 G PO CHEW
4 g | ORAL | Status: DC | PRN
Start: 2022-10-17 — End: 2022-10-23

## 2022-10-17 MED ORDER — NORMAL SALINE FLUSH 0.9 % IV SOLN
0.9 % | Freq: Two times a day (BID) | INTRAVENOUS | Status: DC
Start: 2022-10-17 — End: 2022-10-16

## 2022-10-17 MED ORDER — NORMAL SALINE FLUSH 0.9 % IV SOLN
0.9 % | Freq: Two times a day (BID) | INTRAVENOUS | Status: DC
Start: 2022-10-17 — End: 2022-10-23
  Administered 2022-10-17 – 2022-10-22 (×8): 10 mL via INTRAVENOUS

## 2022-10-17 MED ORDER — FENTANYL CITRATE (PF) 100 MCG/2ML IJ SOLN
100 MCG/2ML | INTRAMUSCULAR | Status: DC | PRN
Start: 2022-10-17 — End: 2022-10-16
  Administered 2022-10-17 (×2): 25 ug via INTRAVENOUS

## 2022-10-17 MED ORDER — NORMAL SALINE FLUSH 0.9 % IV SOLN
0.9 % | INTRAVENOUS | Status: AC | PRN
Start: 2022-10-17 — End: 2022-10-23

## 2022-10-17 MED ORDER — PROCHLORPERAZINE EDISYLATE 10 MG/2ML IJ SOLN
10 MG/2ML | Freq: Once | INTRAMUSCULAR | Status: AC | PRN
Start: 2022-10-17 — End: 2022-10-16
  Administered 2022-10-17: 01:00:00 5 mg via INTRAVENOUS

## 2022-10-17 MED ORDER — GLYCOPYRROLATE PF 0.4 MG/2ML IJ SOSY
0.4 MG/2ML | INTRAMUSCULAR | Status: DC | PRN
Start: 2022-10-17 — End: 2022-10-16
  Administered 2022-10-17: .4 via INTRAVENOUS

## 2022-10-17 MED ORDER — INSULIN LISPRO 100 UNIT/ML IJ SOLN
100 UNIT/ML | Freq: Three times a day (TID) | INTRAMUSCULAR | Status: AC
Start: 2022-10-17 — End: 2022-10-20
  Administered 2022-10-20: 22:00:00 1 [IU] via SUBCUTANEOUS

## 2022-10-17 MED ORDER — SODIUM CHLORIDE 0.9 % IV SOLN
0.9 % | INTRAVENOUS | Status: AC | PRN
Start: 2022-10-17 — End: 2022-10-23

## 2022-10-17 MED ORDER — NALOXEGOL OXALATE 25 MG PO TABS
25 MG | Freq: Every day | ORAL | Status: AC
Start: 2022-10-17 — End: 2022-10-24
  Administered 2022-10-17 – 2022-10-23 (×7): 25 mg via ORAL

## 2022-10-17 MED ORDER — INSULIN GLARGINE 100 UNIT/ML SC SOLN
100 UNIT/ML | Freq: Every evening | SUBCUTANEOUS | Status: AC
Start: 2022-10-17 — End: 2022-10-20
  Administered 2022-10-17 – 2022-10-20 (×4): 25 [IU] via SUBCUTANEOUS

## 2022-10-17 MED ORDER — EMPAGLIFLOZIN 10 MG PO TABS
10 MG | Freq: Every day | ORAL | Status: DC
Start: 2022-10-17 — End: 2022-10-17

## 2022-10-17 MED ORDER — PANTOPRAZOLE SODIUM 40 MG PO TBEC
40 MG | Freq: Two times a day (BID) | ORAL | Status: AC
Start: 2022-10-17 — End: 2022-10-23
  Administered 2022-10-17 – 2022-10-23 (×14): 40 mg via ORAL

## 2022-10-17 MED ORDER — GLUCAGON HCL RDNA (DIAGNOSTIC) 1 MG IJ SOLR
1 MG | INTRAMUSCULAR | Status: AC | PRN
Start: 2022-10-17 — End: 2022-10-23

## 2022-10-17 MED ORDER — DEXTROSE 10 % IV SOLN
10 % | INTRAVENOUS | Status: AC | PRN
Start: 2022-10-17 — End: 2022-10-23

## 2022-10-17 MED ORDER — CARVEDILOL 12.5 MG PO TABS
12.5 MG | Freq: Two times a day (BID) | ORAL | Status: AC
Start: 2022-10-17 — End: 2022-10-23
  Administered 2022-10-17 – 2022-10-23 (×13): 18.75 mg via ORAL

## 2022-10-17 MED ORDER — HYDROMORPHONE HCL PF 1 MG/ML IJ SOLN
1 MG/ML | INTRAMUSCULAR | Status: DC | PRN
Start: 2022-10-17 — End: 2022-10-16
  Administered 2022-10-17: 01:00:00 0.5 mg via INTRAVENOUS

## 2022-10-17 MED ORDER — NORMAL SALINE FLUSH 0.9 % IV SOLN
0.9 % | INTRAVENOUS | Status: DC | PRN
Start: 2022-10-17 — End: 2022-10-16

## 2022-10-17 MED ORDER — SODIUM CHLORIDE 0.9 % IV SOLN
0.9 % | INTRAVENOUS | Status: DC | PRN
Start: 2022-10-17 — End: 2022-10-16

## 2022-10-17 MED ORDER — ACETAMINOPHEN 500 MG PO TABS
500 MG | Freq: Four times a day (QID) | ORAL | Status: AC
Start: 2022-10-17 — End: 2022-10-23
  Administered 2022-10-17 – 2022-10-23 (×24): 1000 mg via ORAL

## 2022-10-17 MED FILL — HYDROMORPHONE HCL PF 500 MG/50ML IJ SOLN: 500 MG/50ML | INTRAMUSCULAR | Qty: 2.5

## 2022-10-17 MED FILL — ONDANSETRON HCL 4 MG/2ML IJ SOLN: 4 MG/2ML | INTRAMUSCULAR | Qty: 2

## 2022-10-17 MED FILL — ACETAMINOPHEN EXTRA STRENGTH 500 MG PO TABS: 500 MG | ORAL | Qty: 2

## 2022-10-17 MED FILL — NORMAL SALINE FLUSH 0.9 % IV SOLN: 0.9 % | INTRAVENOUS | Qty: 40

## 2022-10-17 MED FILL — DEXTROSE 10 % IV SOLN: 10 % | INTRAVENOUS | Qty: 1000

## 2022-10-17 MED FILL — HUMALOG 100 UNIT/ML IJ SOLN: 100 UNIT/ML | INTRAMUSCULAR | Qty: 1

## 2022-10-17 MED FILL — PANTOPRAZOLE SODIUM 40 MG PO TBEC: 40 MG | ORAL | Qty: 1

## 2022-10-17 MED FILL — GABAPENTIN 300 MG PO CAPS: 300 MG | ORAL | Qty: 1

## 2022-10-17 MED FILL — MOVANTIK 25 MG PO TABS: 25 MG | ORAL | Qty: 1

## 2022-10-17 MED FILL — IPRATROPIUM BROMIDE 0.06 % NA SOLN: 0.06 % | NASAL | Qty: 1.5

## 2022-10-17 MED FILL — LANTUS 100 UNIT/ML SC SOLN: 100 UNIT/ML | SUBCUTANEOUS | Qty: 1

## 2022-10-17 MED FILL — AMLODIPINE BESYLATE 5 MG PO TABS: 5 MG | ORAL | Qty: 2

## 2022-10-17 MED FILL — FENTANYL CITRATE (PF) 100 MCG/2ML IJ SOLN: 100 MCG/2ML | INTRAMUSCULAR | Qty: 2

## 2022-10-17 MED FILL — CARVEDILOL 6.25 MG PO TABS: 6.25 MG | ORAL | Qty: 1

## 2022-10-17 MED FILL — HYDROMORPHONE HCL 1 MG/ML IJ SOLN: 1 MG/ML | INTRAMUSCULAR | Qty: 1

## 2022-10-17 MED FILL — LACTATED RINGERS IV SOLN: INTRAVENOUS | Qty: 1000

## 2022-10-17 MED FILL — PROCHLORPERAZINE EDISYLATE 10 MG/2ML IJ SOLN: 10 MG/2ML | INTRAMUSCULAR | Qty: 2

## 2022-10-17 NOTE — Progress Notes (Signed)
The following SGLT2 inhibitor has been discontinued per P&T/MEC approved policy:    Empagliflozin (Jardiance) tablet 25 mg daily for indication diabetes    Please reorder upon discharge if appropriate.     Kirke Corin, PharmD  Clinical Pharmacist  [facility] [phone])

## 2022-10-17 NOTE — Progress Notes (Signed)
General Daily Progress Note    Admission Date: 10/16/2022  Hospital Day 2  Post-Op Day 1  Subjective:   Pain control is adequate.  Small volume of emesis this morning.      Objective:   Patient Vitals for the past 24 hrs:   BP Temp Temp src Pulse Resp SpO2 Height Weight   10/17/22 0826 117/75 97.9 F (36.6 C) Oral 79 18 91 % -- --   10/17/22 0552 -- -- -- 80 -- -- -- --   10/17/22 0445 120/69 98.8 F (37.1 C) Oral 80 18 92 % -- --   10/17/22 0356 -- -- -- 79 -- -- -- --   10/17/22 0200 -- -- -- 79 -- -- -- --   10/17/22 0011 (!) 119/59 97.9 F (36.6 C) Oral 76 18 92 % -- --   10/16/22 2230 132/70 98.2 F (36.8 C) Oral 75 18 -- -- --   10/16/22 2215 (!) 120/58 98.1 F (36.7 C) Oral 76 14 92 % -- --   10/16/22 2200 (!) 120/58 -- -- 75 14 95 % -- --   10/16/22 2145 (!) 146/71 -- -- 71 12 94 % -- --   10/16/22 2130 (!) 163/78 -- -- -- -- -- -- --   10/16/22 2115 (!) 160/74 -- -- 70 14 95 % -- --   10/16/22 2105 (!) 156/78 -- -- 78 12 98 % -- --   10/16/22 2100 (!) 154/70 -- -- 74 14 99 % -- --   10/16/22 2055 138/72 -- -- 75 12 99 % -- --   10/16/22 2050 134/69 98.6 F (37 C) Oral 76 18 99 % -- --   10/16/22 1245 116/81 -- -- 65 16 96 % -- --   10/16/22 1129 138/67 98.6 F (37 C) Oral -- 16 -- -- 97.6 kg (215 lb 2.7 oz)   10/16/22 1125 -- -- -- 68 16 98 % 1.727 m (5\' 8" ) 98.9 kg (218 lb)     No intake/output data recorded.  10/18 1901 - 10/20 0700  In: 4124.7 [P.O.:200; I.V.:3524.7]  Out: 2695 [Urine:2495]      Physical Examination:  General Appearance - No distress.  Abdomen - Dressing intact.  Colostomy edematous but viable.  GU  - Foley catheter draining dilute urine.  Extremities - Pneumatic compression stockings in use.             Data Review   Recent Results (from the past 24 hour(s))   Basic Metabolic Panel    Collection Time: 10/16/22 11:40 AM   Result Value Ref Range    Sodium 137 136 - 145 mmol/L    Potassium 4.2 3.5 - 5.1 mmol/L    Chloride 105 97 - 108 mmol/L    CO2 25 21 - 32 mmol/L    Anion Gap 7 5  - 15 mmol/L    Glucose 259 (H) 65 - 100 mg/dL    BUN 14 6 - 20 MG/DL    Creatinine 10/18/22 5.40 - 1.30 MG/DL    Bun/Cre Ratio 16 12 - 20      Est, Glom Filt Rate >60 >60 ml/min/1.89m2    Calcium 8.6 8.5 - 10.1 MG/DL   CBC    Collection Time: 10/16/22 11:40 AM   Result Value Ref Range    WBC 5.7 4.1 - 11.1 K/uL    RBC 5.30 4.10 - 5.70 M/uL    Hemoglobin 11.5 (L) 12.1 - 17.0 g/dL    Hematocrit 38.4  36.6 - 50.3 %    MCV 72.5 (L) 80.0 - 99.0 FL    MCH 21.7 (L) 26.0 - 34.0 PG    MCHC 29.9 (L) 30.0 - 36.5 g/dL    RDW 71.9 (H) 59.7 - 14.5 %    Platelets 256 150 - 400 K/uL    MPV 10.7 8.9 - 12.9 FL    Nucleated RBCs 0.0 0 PER 100 WBC    nRBC 0.00 0.00 - 0.01 K/uL   TYPE AND SCREEN    Collection Time: 10/16/22 11:40 AM   Result Value Ref Range    Crossmatch expiration date 10/19/2022,2359     ABO/Rh B POSITIVE     Antibody Screen NEG    POCT Glucose    Collection Time: 10/16/22 11:56 AM   Result Value Ref Range    POC Glucose 252 (H) 65 - 117 mg/dL    Performed by: Barbara Compton    CBC without Diff    Collection Time: 10/16/22  9:50 PM   Result Value Ref Range    WBC 10.0 4.1 - 11.1 K/uL    RBC 5.52 4.10 - 5.70 M/uL    Hemoglobin 12.0 (L) 12.1 - 17.0 g/dL    Hematocrit 47.1 85.5 - 50.3 %    MCV 72.8 (L) 80.0 - 99.0 FL    MCH 21.7 (L) 26.0 - 34.0 PG    MCHC 29.9 (L) 30.0 - 36.5 g/dL    RDW 01.5 (H) 86.8 - 14.5 %    Platelets 246 150 - 400 K/uL    MPV 10.2 8.9 - 12.9 FL    Nucleated RBCs 0.0 0 PER 100 WBC    nRBC 0.00 0.00 - 0.01 K/uL   Basic Metabolic Panel    Collection Time: 10/16/22  9:50 PM   Result Value Ref Range    Sodium 138 136 - 145 mmol/L    Potassium 4.8 3.5 - 5.1 mmol/L    Chloride 109 (H) 97 - 108 mmol/L    CO2 22 21 - 32 mmol/L    Anion Gap 7 5 - 15 mmol/L    Glucose 240 (H) 65 - 100 mg/dL    BUN 18 6 - 20 MG/DL    Creatinine 2.57 4.93 - 1.30 MG/DL    Bun/Cre Ratio 16 12 - 20      Est, Glom Filt Rate >60 >60 ml/min/1.57m2    Calcium 8.4 (L) 8.5 - 10.1 MG/DL   Magnesium    Collection Time: 10/16/22  9:50 PM    Result Value Ref Range    Magnesium 2.0 1.6 - 2.4 mg/dL   Phosphorus    Collection Time: 10/16/22  9:50 PM   Result Value Ref Range    Phosphorus 4.3 2.6 - 4.7 MG/DL   POCT Glucose    Collection Time: 10/16/22 10:08 PM   Result Value Ref Range    POC Glucose 235 (H) 65 - 117 mg/dL    Performed by: Jeraldine Loots    POCT Glucose    Collection Time: 10/16/22 11:05 PM   Result Value Ref Range    POC Glucose 225 (H) 65 - 117 mg/dL    Performed by: Sarita Haver    Basic Metabolic Panel    Collection Time: 10/17/22 12:04 AM   Result Value Ref Range    Sodium 138 136 - 145 mmol/L    Potassium 4.7 3.5 - 5.1 mmol/L    Chloride 108 97 - 108 mmol/L    CO2 22 21 -  32 mmol/L    Anion Gap 8 5 - 15 mmol/L    Glucose 231 (H) 65 - 100 mg/dL    BUN 18 6 - 20 MG/DL    Creatinine 1.03 0.70 - 1.30 MG/DL    Bun/Cre Ratio 17 12 - 20      Est, Glom Filt Rate >60 >60 ml/min/1.10m2    Calcium 8.8 8.5 - 10.1 MG/DL   Phosphorus    Collection Time: 10/17/22 12:04 AM   Result Value Ref Range    Phosphorus 4.6 2.6 - 4.7 MG/DL   CBC with Auto Differential    Collection Time: 10/17/22 12:04 AM   Result Value Ref Range    WBC 10.0 4.1 - 11.1 K/uL    RBC 5.75 (H) 4.10 - 5.70 M/uL    Hemoglobin 12.6 12.1 - 17.0 g/dL    Hematocrit 42.4 36.6 - 50.3 %    MCV 73.7 (L) 80.0 - 99.0 FL    MCH 21.9 (L) 26.0 - 34.0 PG    MCHC 29.7 (L) 30.0 - 36.5 g/dL    RDW 18.0 (H) 11.5 - 14.5 %    Platelets 234 150 - 400 K/uL    MPV 10.5 8.9 - 12.9 FL    Nucleated RBCs 0.0 0 PER 100 WBC    nRBC 0.00 0.00 - 0.01 K/uL    Neutrophils % 80 (H) 32 - 75 %    Band Neutrophils 4 0 - 6 %    Lymphocytes % 12 12 - 49 %    Monocytes % 4 (L) 5 - 13 %    Eosinophils % 0 0 - 7 %    Basophils % 0 0 - 1 %    Immature Granulocytes 0 %    Neutrophils Absolute 8.4 (H) 1.8 - 8.0 K/UL    Lymphocytes Absolute 1.2 0.8 - 3.5 K/UL    Monocytes Absolute 0.4 0.0 - 1.0 K/UL    Eosinophils Absolute 0.0 0.0 - 0.4 K/UL    Basophils Absolute 0.0 0.0 - 0.1 K/UL    Absolute Immature Granulocyte 0.0 K/UL     Differential Type MANUAL      RBC Comment ANISOCYTOSIS  1+        RBC Comment MICROCYTOSIS  1+       POCT Glucose    Collection Time: 10/17/22  5:22 AM   Result Value Ref Range    POC Glucose 218 (H) 65 - 117 mg/dL    Performed by: Bascom Levels            Assessment:     Principal Problem:    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 *      1 Day Post-Op s/p open lysis of adhesions, excision of coloanal J-pouch, revision of colostomy and repair of parastomal hernia with Parietene DS mesh.    Hemodynamically stable.  Hemoglobin adequate.  Good urine output.  Creatinine within normal limits.  Doesn't seem ready for solid food yet.    Plan:   Continue clear liquid diet.  Continue maintenance IV fluid.  Ambulate.  Physical therapy.  Incentive spirometer.

## 2022-10-17 NOTE — Wound Image (Signed)
WOCN Ostomy Progress Note:     First postoperative visit.  Surgery: colostomy revision  Date of Surgery: 10.19.23      Surgeon: Dr. Marge Duncans   Stoma Location: left upper quadrant    Ostomy Assessment:  Stoma type: End colostomy  Stoma appearance: red, moist and edematous  Output: serosang drainage  Current appliance: surgical pouch    Recommendations:  1. Empty appliance when 1/3 full and PRN. Encourage patient/family to notify nurse and  assist w/ pouch emptying to promote self-care. Change appliance twice weekly and PRN for leaking ASAP. DO NOT REINFORCE LEAKS to avoid skin breakdown.    Transition of Care:  Will return Monday for pouch change.    Lauris Chroman, BSN RN [facility]  [facility]  Available on Perfect Serve  Office [phone]

## 2022-10-17 NOTE — Progress Notes (Signed)
Pt reports that he has had three episodes of emesis this morning, each time after trying to drink. Stated that emesis  did not start until after ha had been up to walk. Reports drinking liquids last night with no difficulty.

## 2022-10-17 NOTE — Consults (Signed)
Nutrition Note    Chart reviewed; RD provided a 5-day supply of Ensure Surgery/Immunonutrition (10 bottles) to the bedside per ERAS protocol and discussed the importance of adequate nutrition/supplement compliance in effort to optimize wound healing and recovery from surgery. Pt agreeable and reports that he consumed the Ensure Surgery pre-op. RD anticipates  compliance.     Electronically signed by Vella Kohler, RD on 10/17/22  Contact via PerfectServe

## 2022-10-18 ENCOUNTER — Inpatient Hospital Stay: Admit: 2022-10-18 | Payer: MEDICARE

## 2022-10-18 DIAGNOSIS — R918 Other nonspecific abnormal finding of lung field: Secondary | ICD-10-CM | POA: Diagnosis not present

## 2022-10-18 LAB — CBC WITH AUTO DIFFERENTIAL
Absolute Immature Granulocyte: 0 10*3/uL (ref 0.00–0.04)
Basophils %: 0 % (ref 0–1)
Basophils Absolute: 0 10*3/uL (ref 0.0–0.1)
Eosinophils %: 0 % (ref 0–7)
Eosinophils Absolute: 0 10*3/uL (ref 0.0–0.4)
Hematocrit: 36.6 % (ref 36.6–50.3)
Hemoglobin: 10.9 g/dL — ABNORMAL LOW (ref 12.1–17.0)
Immature Granulocytes: 0 % (ref 0.0–0.5)
Lymphocytes %: 20 % (ref 12–49)
Lymphocytes Absolute: 2 10*3/uL (ref 0.8–3.5)
MCH: 21.9 PG — ABNORMAL LOW (ref 26.0–34.0)
MCHC: 29.8 g/dL — ABNORMAL LOW (ref 30.0–36.5)
MCV: 73.5 FL — ABNORMAL LOW (ref 80.0–99.0)
MPV: 10.3 FL (ref 8.9–12.9)
Monocytes %: 7 % (ref 5–13)
Monocytes Absolute: 0.6 10*3/uL (ref 0.0–1.0)
Neutrophils %: 73 % (ref 32–75)
Neutrophils Absolute: 7.1 10*3/uL (ref 1.8–8.0)
Nucleated RBCs: 0 PER 100 WBC
Platelets: 205 10*3/uL (ref 150–400)
RBC: 4.98 M/uL (ref 4.10–5.70)
RDW: 16.9 % — ABNORMAL HIGH (ref 11.5–14.5)
WBC: 9.8 10*3/uL (ref 4.1–11.1)
nRBC: 0 10*3/uL (ref 0.00–0.01)

## 2022-10-18 LAB — BASIC METABOLIC PANEL
Anion Gap: 1 mmol/L — ABNORMAL LOW (ref 5–15)
BUN: 15 MG/DL (ref 6–20)
Bun/Cre Ratio: 17 (ref 12–20)
CO2: 27 mmol/L (ref 21–32)
Calcium: 8.1 MG/DL — ABNORMAL LOW (ref 8.5–10.1)
Chloride: 105 mmol/L (ref 97–108)
Creatinine: 0.87 MG/DL (ref 0.70–1.30)
Est, Glom Filt Rate: >60 mL/min/{1.73_m2} (ref >60)
Glucose: 142 mg/dL — ABNORMAL HIGH (ref 65–100)
Potassium: 4.2 mmol/L (ref 3.5–5.1)
Sodium: 133 mmol/L — ABNORMAL LOW (ref 136–145)

## 2022-10-18 LAB — POCT GLUCOSE
POC Glucose: 148 mg/dL — ABNORMAL HIGH (ref 65–117)
POC Glucose: 104 mg/dL (ref 65–117)
POC Glucose: 128 mg/dL — ABNORMAL HIGH (ref 65–117)
POC Glucose: 146 mg/dL — ABNORMAL HIGH (ref 65–117)

## 2022-10-18 LAB — PHOSPHORUS: Phosphorus: 1.3 MG/DL — ABNORMAL LOW (ref 2.6–4.7)

## 2022-10-18 LAB — MAGNESIUM: Magnesium: 1.8 mg/dL (ref 1.6–2.4)

## 2022-10-18 MED ORDER — SODIUM CHLORIDE 0.9 % IV SOLN
0.9 % | Freq: Once | INTRAVENOUS | Status: DC
Start: 2022-10-18 — End: 2022-10-18

## 2022-10-18 MED ORDER — OXYCODONE HCL 5 MG PO TABS
5 MG | ORAL | Status: DC | PRN
Start: 2022-10-18 — End: 2022-10-23
  Administered 2022-10-19 – 2022-10-23 (×3): 5 mg via ORAL

## 2022-10-18 MED ORDER — SODIUM PHOSPHATE 3 MMOL/ML IV SOLN (MIXTURES ONLY)
3 mmol/mL | Freq: Once | INTRAVENOUS | Status: AC
Start: 2022-10-18 — End: 2022-10-18
  Administered 2022-10-18: 10:00:00 30 mmol via INTRAVENOUS

## 2022-10-18 MED FILL — LANTUS 100 UNIT/ML SC SOLN: 100 UNIT/ML | SUBCUTANEOUS | Qty: 1

## 2022-10-18 MED FILL — GABAPENTIN 300 MG PO CAPS: 300 MG | ORAL | Qty: 1

## 2022-10-18 MED FILL — HYDROMORPHONE HCL PF 500 MG/50ML IJ SOLN: 500 MG/50ML | INTRAMUSCULAR | Qty: 2.5

## 2022-10-18 MED FILL — ACETAMINOPHEN EXTRA STRENGTH 500 MG PO TABS: 500 MG | ORAL | Qty: 2

## 2022-10-18 MED FILL — CARVEDILOL 6.25 MG PO TABS: 6.25 MG | ORAL | Qty: 1

## 2022-10-18 MED FILL — PANTOPRAZOLE SODIUM 40 MG PO TBEC: 40 MG | ORAL | Qty: 1

## 2022-10-18 MED FILL — AMLODIPINE BESYLATE 5 MG PO TABS: 5 MG | ORAL | Qty: 2

## 2022-10-18 MED FILL — ZOLPIDEM TARTRATE 5 MG PO TABS: 5 MG | ORAL | Qty: 1

## 2022-10-18 MED FILL — SODIUM PHOSPHATES 45 MMOLE/15ML IV SOLN: 45 MMOLE/15ML | INTRAVENOUS | Qty: 10

## 2022-10-18 MED FILL — MOVANTIK 25 MG PO TABS: 25 MG | ORAL | Qty: 1

## 2022-10-18 NOTE — Op Note (Unsigned)
[facility]  OPERATIVE REPORT    Name:  Evan Shepherd, Evan Shepherd  MR#:  [id number]  DOB:  [birth year]  ACCOUNT #:  [id number]    DATE OF SERVICE:  10/16/2022    PREOPERATIVE DIAGNOSES:  1.  Colostomy dysfunction  2.  Parastomal hernia  3.  Chronic distal colitis  4.  History of adhesive small bowel obstructions    POSTOPERATIVE DIAGNOSES:  1.  Colostomy dysfunction  2.  Parastomal hernia  3.  Chronic distal colitis  4.  History of adhesive small bowel obstructions    PROCEDURES PERFORMED:  Open lysis of adhesions, excision of coloanal J-pouch, revision of colostomy, and repair of parastomal hernia with Parietene DS mesh    SURGEON:  Theresia Majors, MD    ASSISTANTS:  Merri Ray, RN; Brien Few, SA; Debbrah Alar, SA.    ANESTHESIA:  General endotracheal and epidural    SPECIMENS REMOVED:  1.  Distal colon including colonic J-pouch  2.  Colostomy    TUBES AND DRAINS:  None    ESTIMATED BLOOD LOSS:  150 mL    INTRAVENOUS FLUIDS:  Crystalloid 2800 mL; albumin 250 mL.    URINE OUTPUT:  550 mL    COMPLICATIONS:  None apparent    IMPLANTS:  20 x 15 cm Parietene DS mesh    INDICATIONS FOR PROCEDURE:  The patient is a 66 year old male with a complicated surgical history.  He underwent an intersphincteric proctectomy with the creation of a colonic J-pouch-to-anal anastomosis and diverting loop ileostomy in 2006 for treatment of rectal cancer.  The ileostomy was closed in 2007, and he had good function for the first one or two years after the operation.  Gradually, he experienced increasing problems with fecal incontinence.  These problems eventually became intractable and, on 12/03/2021, he underwent the hand-assisted laparoscopic creation of a divided loop colostomy.  The creation of the colostomy solved the fecal incontinence problem; however, the patient continued to struggle with bleeding and wetness in the anal area secondary to prolapse of the mucosa of the colonic J-pouch.  These symptoms  severely decreased his quality of life, so a proctoplasty was performed on 04/08/2022.  That procedure helped to a certain degree, but the improvement was insufficient.  The patient has also developed problems with managing the colostomy.  These problems had been increasing and appeared to be related to two main factors.  One was that there was distortion of the peristomal anatomy associated with the mucous fistula portion of the divided loop colostomy that seemed to create a curvature that interfered with appliance adherence.  Additionally, a CT scan of the abdomen and pelvis performed on 09/04/2022 was interpreted as revealing parastomal herniation of the mesenteric fat and vessels that was increased compared to what had been seen on the previous CT scan.  This herniation also seemed to cause some discomfort and to make ostomy appliance management more difficult.  Finally, the patient has had multiple emergency room visits and hospitalizations related to intermittent partial small-bowel obstructions believed to be adhesive in nature.  In order to address all three of these problems, what was proposed was a laparotomy with lysis of adhesions, resection of the relatively easily accessible portion of the coloanal J-pouch, revision of the colostomy to convert it to an end stoma instead of a divided loop, and repair of the parastomal hernia.  The risks of these procedures were discussed in detail, and the patient agreed to proceed.    OPERATIVE FINDINGS:  There were moderately extensive adhesions involving the small intestine without evidence of obstruction.  Some of these adhesions were elongated and band-like and could clearly enable the occurrence of internal herniation.  The ileoileostomy was healthy and widely patent.  The colon, including the diverted distal segment and the J-pouch, appeared to be healthy.  There was a moderate-sized parastomal hernia containing colonic mesentery.  There was no evidence of  malignancy.    DESCRIPTION OF PROCEDURE:  After informed consent was obtained and after an epidural catheter had been placed by the anesthesiologist, the patient was taken to the operating room where standard monitoring devices were attached and adequate general endotracheal anesthesia was induced.  He was then positioned in lithotomy with the lower extremities fitted with the pneumatic compression stockings and supported by the padded hydraulic Allen stirrups.  The perineum was prepped and draped in the usual sterile fashion then Evan Meeker, MD performed cystoscopy and the placement of bilateral ureteral catheters and a Foley catheter.  The urologic procedure had been requested in order to facilitate intraoperative identification of the ureters.  Once it was completed, digital examination of the anal canal and the distal portion of the colonic J-pouch was performed than a 3-way Foley catheter with a 30 mL balloon was introduced transanally and used to irrigate the distal colonic segment with sterile water until the effluent ran clear.  The catheter was then removed.  The patient was repositioned into a low lithotomy with a gel pad placed beneath the sacrum and both upper extremities extended on arm boards.  The patient had been marked in the pre-operative area for possible alternative colostomy sites.  The two openings of the divided loop colostomy were each closed with 2-0 silk pursestring sutures.  The abdomen and perineum were then prepped and draped in the usual sterile fashion.  500 mg of metronidazole and 2 g of cefazolin were administered parenterally.  The patient had also undergone mechanical and antibiotic bowel preparation at home on the day before the operation.    The abdomen was entered through the midline scar using a scalpel.  The dissection was then continued with scalpel and electrocautery.  A piece of mesh that had been placed years ago in the anterior midline was opened.  Adhesions to  the anterior abdominal wall were gradually taken down.  The first hour or more of the operation was essentially spent lysing adhesions to free the abdominal wall and to eliminate any suspicious or potential areas of small bowel obstruction.  The colostomy was separated from the abdominal wall by making a skin incision at the mucocutaneous junction using a scalpel, then extending that incision with the electrocautery.  The attachments to the peritoneum were divided and the stoma was delivered into the abdominal cavity.  At this point, it was possible to place a large wound protector and the Bookwalter retractor.    The next phase of the operation was the excision of the distal segment, including most of the colonic J-pouch.  The dissection was performed mostly using the articulating Enseal.  Great care was of course taken to avoid injuring either ureter, the bladder, or any of the urologic hardware associated with the patient's penile prosthesis.  The dissection was carried down deep into the pelvis, but not all the way to the levator muscles.  The colonic J-pouch was then divided distally using a single firing of the Contour stapling device with a blue cartridge.  It should have been noted that prior to performing  that dissection, the distal limb of the divided loop colostomy was separated from the proximal limb by gradually dividing the mesentery in between using the Enseal, the electrocautery, and some sharp division between clamps.  The sharply divided structures had been ligated with 2-0 silk ties and 2-0 silk sutures.  Contamination of the abdominal cavity during the mobilization of the distal segment was prevented by closing the mucous fistula opening with a firing of the GIA-75 stapling device.  It should also be noted that the dissection to mobilize the distal segment was carried out relatively close to the bowel since this resection was not for cancer and since doing so was believed to be likely to reduce  the risk of injuring pelvic nerves or other structures.    Once that specimen was removed, gowns and gloves were changed and, returning to the abdomen, the hernia defect was assessed.  The colon was prepared and brought through the abdominal wall at the original site.  The hernia defect was then reduced in size by placement of multiple 0 Vicryl sutures that included bites of peritoneum and some of the fascia.  The opening was made significantly smaller, but not so small that it would obstruct or constrict the colon.  To complete the hernia repair, a 20 cm x 15 cm Paritene DS mesh was brought onto the field.  It was positioned with the adhesion barrier side towards the viscera in the abdominal cavity and the other side towards the abdominal wall, and it was secured in a Sugarbaker fashion using one of the existing Prolene sutures (that was part of the mesh) brought out laterally using a transfascial suture passer.  A second Prolene suture was placed in the same way, but that suture broke.  We decided to use a different technique to secure the mesh, so numerous fixation tacks were placed using a combination of the Stat Tack device and the SecureStrap open tacking device, followed by a running 2-0 V-Loc suture to secure another portion of the parameter of the mesh.  Care was of course taken to avoid suturing the bowel itself which, it should have been noted, was also secured or tacked to the peritoneum with some 3-0 silk sutures prior to placement of the mesh.        With the mesh in place, the abdominal cavity was re-inspected.  The pelvis had been copiously irrigated earlier on.  Inspection now revealed good hemostasis and it was decided not to place a drain.  The small intestine was run and after some additional adhesiolysis, it was allowed to return to its domain within the abdominal cavity.   Four small sheets of the Seprafilm were then placed on the viscera.          Per protocol, gowns and gloves were changed  and the dedicated closing instrument set was opened.  The colostomy itself had already been resected using a transverse firing of the GIA-75 stapling device with a blue cartridge.  That should have been noted earlier in the description of the procedure.  What was left on the field was Babcock clamps on the new colonic staple line.  After changing gown and gloves, the patient was re-draped.  The midline fascial closure which included reapproximation of the fascia and the old mesh was performed with two running #1 PDS sutures.  The subcutaneous portion wound was irrigated with saline.  The subcutaneous adipose tissue was reapproximated using 2-0 Vicryl sutures.  Then, the skin closure was performed with  staples.  The midline wound was covered.          Maturation of the colostomy was somewhat complicated because the skin opening was now larger than the bowel itself.  Also, there was a subcutaneous portion of the cavity that contained hernia sac and that had a greater volume than the bowel that was now brought through the abdominal wall.  The hernia sac was mostly dissected out using the electrocautery.  The wound was irrigated.  2-0 Vicryl sutures were used to close some of the dead space, which was located inferior to the stoma.  This created a certain amount of distortion of the abdominal wall anatomy that then also had to be addressed.  In order to do this and to make the skin opening smaller in diameter so as to better match the diameter of the colon, a V-shaped piece of skin and subcutaneous tissue was excised from the inferior part.  This V-shaped wound was then closed with subcutaneous 2-0 Vicryl sutures and subcuticular 4-0 Monocryl sutures.  Dermabond was then applied.  Once the Dermabond had dried, the colostomy was matured.  The staple line was excised using the electrocautery and scant colonic contents were evacuated using suction.  The bowel was everted with tripartite 3-0 Vicryl sutures in four quadrants  and then numerous additional 3-0 Vicryl sutures were placed to complete the construction of the mucocutaneous junction.  Upon completion, the stoma appeared to be adequately budded and well perfused.  An ostomy appliance was obtained and cut to the appropriate size and shape and then put in place.  The midline wound was dressed.  The ureteral catheters were removed.  An orogastric tube that had been placed by the anesthetist at the beginning of the case was also removed.  There were no apparent complications, and the patient appeared to have tolerated the procedure well.  At its conclusion, he was extubated and transported in stable condition to the recovery room.  He had received re-dosing of antibiotics at the appropriate intervals.  All sponge, needle, and instrument counts were correct.        Theresia Majors, MD      [id number]  D:  10/18/2022 12:05  T:  10/18/2022 12:59  JOB #:  [id number]  CC:  Theresia Majors, MD

## 2022-10-18 NOTE — Progress Notes (Signed)
General Daily Progress Note    Admission Date: 10/16/2022  Hospital Day 3  Post-Op Day 2  Subjective:   Pain control adequate.  Slept well after receiving Ambien.  No nausea this morning.  No output from colostomy.      Objective:   Patient Vitals for the past 24 hrs:   BP Temp Temp src Pulse Resp SpO2   10/18/22 0838 (!) 87/56 98.8 F (37.1 C) Oral 76 18 94 %   10/18/22 0539 -- -- -- 92 -- --   10/18/22 0346 -- -- -- 90 -- --   10/18/22 0316 110/61 (!) 100.9 F (38.3 C) Oral 95 18 94 %   10/18/22 0138 -- -- -- 93 -- --   10/18/22 0033 111/65 (!) 100.9 F (38.3 C) Oral 95 18 91 %   10/17/22 2140 -- -- -- 93 -- --   10/17/22 2037 134/70 (!) 100.9 F (38.3 C) Oral 90 18 93 %   10/17/22 1953 -- -- -- 93 -- --   10/17/22 1806 (!) 144/72 -- -- 96 -- --   10/17/22 1752 -- -- -- 87 -- --   10/17/22 1522 125/72 99.3 F (37.4 C) Oral 83 18 92 %   10/17/22 1400 -- -- -- 81 -- --   10/17/22 1200 -- -- -- 86 -- --   10/17/22 1107 117/75 -- -- 85 -- --   10/17/22 1000 -- -- -- 85 -- --     No intake/output data recorded.  10/19 1901 - 10/21 0700  In: 1724.7 [P.O.:200; I.V.:1524.7]  Out: 3845 [Urine:3645]    Physical Examination:  General Appearance - No distress.  Lungs - Clear to auscultation.  Heart - Normal sinus rhythm.  Abdomen - Dressing intact.  Colostomy edematous but viable.  GU  - Foley catheter in place.  Extremities - Pneumatic compression stockings in use.             Data Review   Recent Results (from the past 24 hour(s))   POCT Glucose    Collection Time: 10/17/22 11:46 AM   Result Value Ref Range    POC Glucose 159 (H) 65 - 117 mg/dL    Performed by: Helyn App    POCT Glucose    Collection Time: 10/17/22  4:55 PM   Result Value Ref Range    POC Glucose 139 (H) 65 - 117 mg/dL    Performed by: Helyn App    POCT Glucose    Collection Time: 10/17/22  9:30 PM   Result Value Ref Range    POC Glucose 148 (H) 65 - 117 mg/dL    Performed by: Mohammed Kindle    CBC with Auto Differential    Collection Time:  10/18/22 12:32 AM   Result Value Ref Range    WBC 9.8 4.1 - 11.1 K/uL    RBC 4.98 4.10 - 5.70 M/uL    Hemoglobin 10.9 (L) 12.1 - 17.0 g/dL    Hematocrit 29.9 37.1 - 50.3 %    MCV 73.5 (L) 80.0 - 99.0 FL    MCH 21.9 (L) 26.0 - 34.0 PG    MCHC 29.8 (L) 30.0 - 36.5 g/dL    RDW 69.6 (H) 78.9 - 14.5 %    Platelets 205 150 - 400 K/uL    MPV 10.3 8.9 - 12.9 FL    Nucleated RBCs 0.0 0 PER 100 WBC    nRBC 0.00 0.00 - 0.01 K/uL    Neutrophils %  73 32 - 75 %    Lymphocytes % 20 12 - 49 %    Monocytes % 7 5 - 13 %    Eosinophils % 0 0 - 7 %    Basophils % 0 0 - 1 %    Immature Granulocytes 0 0.0 - 0.5 %    Neutrophils Absolute 7.1 1.8 - 8.0 K/UL    Lymphocytes Absolute 2.0 0.8 - 3.5 K/UL    Monocytes Absolute 0.6 0.0 - 1.0 K/UL    Eosinophils Absolute 0.0 0.0 - 0.4 K/UL    Basophils Absolute 0.0 0.0 - 0.1 K/UL    Absolute Immature Granulocyte 0.0 0.00 - 0.04 K/UL    Differential Type AUTOMATED     Basic Metabolic Panel    Collection Time: 10/18/22 12:32 AM   Result Value Ref Range    Sodium 133 (L) 136 - 145 mmol/L    Potassium 4.2 3.5 - 5.1 mmol/L    Chloride 105 97 - 108 mmol/L    CO2 27 21 - 32 mmol/L    Anion Gap 1 (L) 5 - 15 mmol/L    Glucose 142 (H) 65 - 100 mg/dL    BUN 15 6 - 20 MG/DL    Creatinine 0.87 0.70 - 1.30 MG/DL    Bun/Cre Ratio 17 12 - 20      Est, Glom Filt Rate >60 >60 ml/min/1.66m2    Calcium 8.1 (L) 8.5 - 10.1 MG/DL   Magnesium    Collection Time: 10/18/22 12:32 AM   Result Value Ref Range    Magnesium 1.8 1.6 - 2.4 mg/dL   Phosphorus    Collection Time: 10/18/22 12:32 AM   Result Value Ref Range    Phosphorus 1.3 (L) 2.6 - 4.7 MG/DL   POCT Glucose    Collection Time: 10/18/22  6:38 AM   Result Value Ref Range    POC Glucose 104 65 - 117 mg/dL    Performed by: Gwendlyn Deutscher            Assessment:     Principal Problem:    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     Hemodynamically stable.  Hemoglobin adequate.  Good urine output.  Creatinine within normal limits.  Hypophosphatemic.  Fevers, but no leukocytosis.    Awaiting return of GI function.    Plan:   Continue clear liquid diet.  Decrease maintenance IV rate.  Replace phosphorus.  Continue epidural analgesia.  Continue Foley catheter to prevent urinary retention.  Discontinue telemetry.  Ambulate.  Physical therapy.  Incentive spirometer.

## 2022-10-19 LAB — CBC WITH AUTO DIFFERENTIAL
Absolute Immature Granulocyte: 0 10*3/uL (ref 0.00–0.04)
Basophils %: 0 % (ref 0–1)
Basophils Absolute: 0 10*3/uL (ref 0.0–0.1)
Eosinophils %: 2 % (ref 0–7)
Eosinophils Absolute: 0.1 10*3/uL (ref 0.0–0.4)
Hematocrit: 36.3 % — ABNORMAL LOW (ref 36.6–50.3)
Hemoglobin: 10.8 g/dL — ABNORMAL LOW (ref 12.1–17.0)
Immature Granulocytes: 0 % (ref 0.0–0.5)
Lymphocytes %: 13 % (ref 12–49)
Lymphocytes Absolute: 1.1 10*3/uL (ref 0.8–3.5)
MCH: 22 PG — ABNORMAL LOW (ref 26.0–34.0)
MCHC: 29.8 g/dL — ABNORMAL LOW (ref 30.0–36.5)
MCV: 73.8 FL — ABNORMAL LOW (ref 80.0–99.0)
MPV: 10.3 FL (ref 8.9–12.9)
Monocytes %: 5 % (ref 5–13)
Monocytes Absolute: 0.5 10*3/uL (ref 0.0–1.0)
Neutrophils %: 80 % — ABNORMAL HIGH (ref 32–75)
Neutrophils Absolute: 6.9 10*3/uL (ref 1.8–8.0)
Nucleated RBCs: 0 PER 100 WBC
Platelets: 187 10*3/uL (ref 150–400)
RBC: 4.92 M/uL (ref 4.10–5.70)
RDW: 16.8 % — ABNORMAL HIGH (ref 11.5–14.5)
WBC: 8.6 10*3/uL (ref 4.1–11.1)
nRBC: 0 10*3/uL (ref 0.00–0.01)

## 2022-10-19 LAB — POCT GLUCOSE
POC Glucose: 141 mg/dL — ABNORMAL HIGH (ref 65–117)
POC Glucose: 108 mg/dL (ref 65–117)
POC Glucose: 127 mg/dL — ABNORMAL HIGH (ref 65–117)
POC Glucose: 98 mg/dL (ref 65–117)
POC Glucose: 116 mg/dL (ref 65–117)

## 2022-10-19 LAB — BASIC METABOLIC PANEL
Anion Gap: 4 mmol/L — ABNORMAL LOW (ref 5–15)
BUN: 12 MG/DL (ref 6–20)
Bun/Cre Ratio: 17 (ref 12–20)
CO2: 24 mmol/L (ref 21–32)
Calcium: 8.5 MG/DL (ref 8.5–10.1)
Chloride: 104 mmol/L (ref 97–108)
Creatinine: 0.7 MG/DL (ref 0.70–1.30)
Est, Glom Filt Rate: >60 mL/min/{1.73_m2} (ref >60)
Glucose: 99 mg/dL (ref 65–100)
Potassium: 4.1 mmol/L (ref 3.5–5.1)
Sodium: 132 mmol/L — ABNORMAL LOW (ref 136–145)

## 2022-10-19 LAB — PHOSPHORUS: Phosphorus: 2 MG/DL — ABNORMAL LOW (ref 2.6–4.7)

## 2022-10-19 MED ORDER — LACTATED RINGERS IV SOLN
INTRAVENOUS | Status: DC
Start: 2022-10-19 — End: 2022-10-20
  Administered 2022-10-19 – 2022-10-20 (×3): via INTRAVENOUS

## 2022-10-19 MED ORDER — HYDROMORPHONE HCL PF 1 MG/ML IJ SOLN
1 MG/ML | INTRAMUSCULAR | Status: DC | PRN
Start: 2022-10-19 — End: 2022-10-19

## 2022-10-19 MED ORDER — HYDROMORPHONE HCL PF 1 MG/ML IJ SOLN
1 MG/ML | INTRAMUSCULAR | Status: AC | PRN
Start: 2022-10-19 — End: 2022-10-23

## 2022-10-19 MED ORDER — ALBUTEROL SULFATE (2.5 MG/3ML) 0.083% IN NEBU
Freq: Four times a day (QID) | RESPIRATORY_TRACT | Status: DC | PRN
Start: 2022-10-19 — End: 2022-10-23

## 2022-10-19 MED ORDER — FUROSEMIDE 10 MG/ML IJ SOLN
10 MG/ML | Freq: Once | INTRAMUSCULAR | Status: AC
Start: 2022-10-19 — End: 2022-10-18
  Administered 2022-10-19: 02:00:00 20 mg via INTRAVENOUS

## 2022-10-19 MED ORDER — SODIUM CHLORIDE 0.9 % IV SOLN
0.9 % | Freq: Once | INTRAVENOUS | Status: AC
Start: 2022-10-19 — End: 2022-10-19
  Administered 2022-10-19: 16:00:00 30 mmol via INTRAVENOUS

## 2022-10-19 MED FILL — PANTOPRAZOLE SODIUM 40 MG PO TBEC: 40 MG | ORAL | Qty: 1

## 2022-10-19 MED FILL — LANTUS 100 UNIT/ML SC SOLN: 100 UNIT/ML | SUBCUTANEOUS | Qty: 1

## 2022-10-19 MED FILL — GABAPENTIN 300 MG PO CAPS: 300 MG | ORAL | Qty: 1

## 2022-10-19 MED FILL — ACETAMINOPHEN EXTRA STRENGTH 500 MG PO TABS: 500 MG | ORAL | Qty: 2

## 2022-10-19 MED FILL — OXYCODONE HCL 5 MG PO TABS: 5 MG | ORAL | Qty: 1

## 2022-10-19 MED FILL — ZOLPIDEM TARTRATE 5 MG PO TABS: 5 MG | ORAL | Qty: 1

## 2022-10-19 MED FILL — ONDANSETRON HCL 4 MG/2ML IJ SOLN: 4 MG/2ML | INTRAMUSCULAR | Qty: 2

## 2022-10-19 MED FILL — FUROSEMIDE 10 MG/ML IJ SOLN: 10 MG/ML | INTRAMUSCULAR | Qty: 2

## 2022-10-19 MED FILL — ALBUTEROL SULFATE (5 MG/ML) 0.5% IN NEBU: RESPIRATORY_TRACT | Qty: 15

## 2022-10-19 NOTE — Progress Notes (Signed)
General Daily Progress Note    Admission Date: 10/16/2022  Hospital Day 4  Post-Op Day 3  Subjective:       Large volume of emesis this morning.  Just started to pass flatus via colostomy.  Breathing is better.  Pain control has been adequate so far with the epidurla analgesia on hold since yeterday evening.      Objective:   Patient Vitals for the past 24 hrs:   BP Temp Temp src Pulse Resp SpO2   10/19/22 0852 127/75 98.6 F (37 C) -- 77 18 94 %   10/19/22 0322 120/70 98.8 F (37.1 C) -- 82 18 93 %   10/18/22 2341 128/72 98.8 F (37.1 C) Oral 81 18 94 %   10/18/22 2030 128/84 99.5 F (37.5 C) Oral 80 18 94 %   10/18/22 1915 122/69 100.2 F (37.9 C) Oral 84 18 --   10/18/22 1751 119/67 -- -- -- -- --   10/18/22 1745 119/67 -- -- 80 -- --     No intake/output data recorded.  10/20 1901 - 10/22 0700  In: -   Out: 2360 [Urine:1850]    Physical Examination:  General Appearance - No distress.  Lungs - Clear to auscultation.  Heart - Normal sinus rhythm.  Abdomen - Incision clean, dry, and intact.  Colostomy edematous but viable.  GU  - Foley catheter in place.  Extremities - Pneumatic compression stockings in use.             Data Review   Recent Results (from the past 24 hour(s))   POCT Glucose    Collection Time: 10/18/22 12:16 PM   Result Value Ref Range    POC Glucose 128 (H) 65 - 117 mg/dL    Performed by: Aldine Contes  PCT    POCT Glucose    Collection Time: 10/18/22  4:13 PM   Result Value Ref Range    POC Glucose 146 (H) 65 - 117 mg/dL    Performed by: Aldine Contes  PCT    POCT Glucose    Collection Time: 10/18/22  9:45 PM   Result Value Ref Range    POC Glucose 141 (H) 65 - 117 mg/dL    Performed by: GREEN Natural    CBC with Auto Differential    Collection Time: 10/19/22  4:23 AM   Result Value Ref Range    WBC 8.6 4.1 - 11.1 K/uL    RBC 4.92 4.10 - 5.70 M/uL    Hemoglobin 10.8 (L) 12.1 - 17.0 g/dL    Hematocrit 36.3 (L) 36.6 - 50.3 %    MCV 73.8 (L) 80.0 - 99.0 FL    MCH 22.0 (L) 26.0 - 34.0 PG    MCHC  29.8 (L) 30.0 - 36.5 g/dL    RDW 16.8 (H) 11.5 - 14.5 %    Platelets 187 150 - 400 K/uL    MPV 10.3 8.9 - 12.9 FL    Nucleated RBCs 0.0 0 PER 100 WBC    nRBC 0.00 0.00 - 0.01 K/uL    Neutrophils % 80 (H) 32 - 75 %    Lymphocytes % 13 12 - 49 %    Monocytes % 5 5 - 13 %    Eosinophils % 2 0 - 7 %    Basophils % 0 0 - 1 %    Immature Granulocytes 0 0.0 - 0.5 %    Neutrophils Absolute 6.9 1.8 - 8.0 K/UL    Lymphocytes  Absolute 1.1 0.8 - 3.5 K/UL    Monocytes Absolute 0.5 0.0 - 1.0 K/UL    Eosinophils Absolute 0.1 0.0 - 0.4 K/UL    Basophils Absolute 0.0 0.0 - 0.1 K/UL    Absolute Immature Granulocyte 0.0 0.00 - 0.04 K/UL    Differential Type AUTOMATED     Basic Metabolic Panel    Collection Time: 10/19/22  4:23 AM   Result Value Ref Range    Sodium 132 (L) 136 - 145 mmol/L    Potassium 4.1 3.5 - 5.1 mmol/L    Chloride 104 97 - 108 mmol/L    CO2 24 21 - 32 mmol/L    Anion Gap 4 (L) 5 - 15 mmol/L    Glucose 99 65 - 100 mg/dL    BUN 12 6 - 20 MG/DL    Creatinine 9.56 3.87 - 1.30 MG/DL    Bun/Cre Ratio 17 12 - 20      Est, Glom Filt Rate >60 >60 ml/min/1.35m2    Calcium 8.5 8.5 - 10.1 MG/DL   Phosphorus    Collection Time: 10/19/22  4:23 AM   Result Value Ref Range    Phosphorus 2.0 (L) 2.6 - 4.7 MG/DL   POCT Glucose    Collection Time: 10/19/22  4:43 AM   Result Value Ref Range    POC Glucose 108 65 - 117 mg/dL    Performed by: GREEN Natural    POCT Glucose    Collection Time: 10/19/22  6:43 AM   Result Value Ref Range    POC Glucose 127 (H) 65 - 117 mg/dL    Performed by: GREEN Natural      Portable CXR (10/18/2022):  Low lung volumes.      Assessment:     Principal Problem:    Colostomy dysfunction ([facility])  Active Problems:    Type II diabetes mellitus (H[facility])    Intra-abdominal adhesions  Resolved Problems:    * No resolved hospital problems. 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     Hemodynamically  stable.  Hemoglobin adequate.  Diuresed yesterday in case hypoxia was secondary to pulmonary edema.  Good urine output.  Creatinine within normal limits.  Still hypophosphatemic.  No leukocytosis.  Awaiting return of GI function.    Plan:   Continue clear liquid diet.  Increase maintenance IV rate.  Replace phosphorus.  Discontinue epidural analgesia and remove epidural catheter.  Remove Foley catheter.  Monitor for urinary retention.  Ambulate.  Physical therapy.  Incentive spirometer.

## 2022-10-20 LAB — BASIC METABOLIC PANEL
Anion Gap: 4 mmol/L — ABNORMAL LOW (ref 5–15)
BUN: 9 MG/DL (ref 6–20)
Bun/Cre Ratio: 13 (ref 12–20)
CO2: 25 mmol/L (ref 21–32)
Calcium: 8 MG/DL — ABNORMAL LOW (ref 8.5–10.1)
Chloride: 109 mmol/L — ABNORMAL HIGH (ref 97–108)
Creatinine: 0.69 MG/DL — ABNORMAL LOW (ref 0.70–1.30)
Est, Glom Filt Rate: >60 mL/min/{1.73_m2} (ref >60)
Glucose: 141 mg/dL — ABNORMAL HIGH (ref 65–100)
Sodium: 138 mmol/L (ref 136–145)

## 2022-10-20 LAB — CBC WITH AUTO DIFFERENTIAL
Absolute Immature Granulocyte: 0 10*3/uL (ref 0.00–0.04)
Basophils %: 0 % (ref 0–1)
Basophils Absolute: 0 10*3/uL (ref 0.0–0.1)
Eosinophils %: 6 % (ref 0–7)
Eosinophils Absolute: 0.4 10*3/uL (ref 0.0–0.4)
Hematocrit: 35 % — ABNORMAL LOW (ref 36.6–50.3)
Hemoglobin: 10.5 g/dL — ABNORMAL LOW (ref 12.1–17.0)
Immature Granulocytes: 1 % — ABNORMAL HIGH (ref 0.0–0.5)
Lymphocytes %: 20 % (ref 12–49)
Lymphocytes Absolute: 1.2 10*3/uL (ref 0.8–3.5)
MCH: 21.8 PG — ABNORMAL LOW (ref 26.0–34.0)
MCHC: 30 g/dL (ref 30.0–36.5)
MCV: 72.8 FL — ABNORMAL LOW (ref 80.0–99.0)
MPV: 10.2 FL (ref 8.9–12.9)
Monocytes %: 8 % (ref 5–13)
Monocytes Absolute: 0.5 10*3/uL (ref 0.0–1.0)
Neutrophils %: 65 % (ref 32–75)
Neutrophils Absolute: 3.7 10*3/uL (ref 1.8–8.0)
Nucleated RBCs: 0 PER 100 WBC
Platelets: 171 10*3/uL (ref 150–400)
RBC: 4.81 M/uL (ref 4.10–5.70)
RDW: 16.8 % — ABNORMAL HIGH (ref 11.5–14.5)
WBC: 5.7 10*3/uL (ref 4.1–11.1)
nRBC: 0 10*3/uL (ref 0.00–0.01)

## 2022-10-20 LAB — POCT GLUCOSE
POC Glucose: 142 mg/dL — ABNORMAL HIGH (ref 65–117)
POC Glucose: 101 mg/dL (ref 65–117)
POC Glucose: 150 mg/dL — ABNORMAL HIGH (ref 65–117)
POC Glucose: 233 mg/dL — ABNORMAL HIGH (ref 65–117)

## 2022-10-20 LAB — PHOSPHORUS: Phosphorus: 2.4 MG/DL — ABNORMAL LOW (ref 2.6–4.7)

## 2022-10-20 MED ORDER — INSULIN LISPRO 100 UNIT/ML IJ SOLN
100 UNIT/ML | Freq: Three times a day (TID) | INTRAMUSCULAR | Status: DC
Start: 2022-10-20 — End: 2022-10-23
  Administered 2022-10-21 – 2022-10-23 (×3): 2 [IU] via SUBCUTANEOUS

## 2022-10-20 MED ORDER — NYSTATIN 100000 UNIT/ML MT SUSP
100000 UNIT/ML | Freq: Four times a day (QID) | OROMUCOSAL | Status: DC
Start: 2022-10-20 — End: 2022-10-23
  Administered 2022-10-20 – 2022-10-23 (×11): 500000 mL via ORAL

## 2022-10-20 MED ORDER — INSULIN LISPRO 100 UNIT/ML IJ SOLN
100 UNIT/ML | Freq: Every evening | INTRAMUSCULAR | Status: DC
Start: 2022-10-20 — End: 2022-10-23

## 2022-10-20 MED ORDER — INSULIN GLARGINE 100 UNIT/ML SC SOLN
100 UNIT/ML | Freq: Every evening | SUBCUTANEOUS | Status: AC
Start: 2022-10-20 — End: 2022-10-23
  Administered 2022-10-21 – 2022-10-23 (×3): 35 [IU] via SUBCUTANEOUS

## 2022-10-20 NOTE — Progress Notes (Signed)
General Daily Progress Note    Admission Date: 10/16/2022  Hospital Day 5  Post-Op Day 4  Subjective:   Pain adequately controlled.  No emesis.      Objective:   Patient Vitals for the past 24 hrs:   BP Temp Temp src Pulse Resp SpO2   10/20/22 0844 130/76 97.9 F (36.6 C) Oral 72 18 93 %   10/20/22 0530 113/65 99.1 F (37.3 C) Oral 70 18 94 %   10/20/22 0130 108/64 98.4 F (36.9 C) Oral 75 18 94 %   10/19/22 2315 (!) 105/59 98.9 F (37.2 C) Oral 74 18 94 %   10/19/22 1815 135/73 99 F (37.2 C) Oral 77 18 --     10/23 0701 - 10/23 1900  In: -   Out: 500 [Urine:500]  10/21 1901 - 10/23 0700  In: 1823.3 [P.O.:200; I.V.:1623.3]  Out: 5260 [Urine:4250]    Physical Examination:  General Appearance - No distress.  Abdomen - Incision clean, dry, and intact.  Colostomy edematous but viable.  Extremities - Pneumatic compression stockings in use.         Data Review   Recent Results (from the past 24 hour(s))   POCT Glucose    Collection Time: 10/19/22 11:32 AM   Result Value Ref Range    POC Glucose 98 65 - 117 mg/dL    Performed by: Electa Sniff    POCT Glucose    Collection Time: 10/19/22  4:55 PM   Result Value Ref Range    POC Glucose 116 65 - 117 mg/dL    Performed by: Lawernce Ion    POCT Glucose    Collection Time: 10/19/22 11:19 PM   Result Value Ref Range    POC Glucose 142 (H) 65 - 117 mg/dL    Performed by: Bascom Levels    CBC with Auto Differential    Collection Time: 10/20/22 12:04 AM   Result Value Ref Range    WBC 5.7 4.1 - 11.1 K/uL    RBC 4.81 4.10 - 5.70 M/uL    Hemoglobin 10.5 (L) 12.1 - 17.0 g/dL    Hematocrit 35.0 (L) 36.6 - 50.3 %    MCV 72.8 (L) 80.0 - 99.0 FL    MCH 21.8 (L) 26.0 - 34.0 PG    MCHC 30.0 30.0 - 36.5 g/dL    RDW 16.8 (H) 11.5 - 14.5 %    Platelets 171 150 - 400 K/uL    MPV 10.2 8.9 - 12.9 FL    Nucleated RBCs 0.0 0 PER 100 WBC    nRBC 0.00 0.00 - 0.01 K/uL    Neutrophils % 65 32 - 75 %    Lymphocytes % 20 12 - 49 %    Monocytes % 8 5 - 13 %    Eosinophils % 6 0 - 7 %     Basophils % 0 0 - 1 %    Immature Granulocytes 1 (H) 0.0 - 0.5 %    Neutrophils Absolute 3.7 1.8 - 8.0 K/UL    Lymphocytes Absolute 1.2 0.8 - 3.5 K/UL    Monocytes Absolute 0.5 0.0 - 1.0 K/UL    Eosinophils Absolute 0.4 0.0 - 0.4 K/UL    Basophils Absolute 0.0 0.0 - 0.1 K/UL    Absolute Immature Granulocyte 0.0 0.00 - 0.04 K/UL    Differential Type AUTOMATED     Basic Metabolic Panel    Collection Time: 10/20/22 12:04 AM   Result Value Ref Range  Sodium 138 136 - 145 mmol/L    Potassium HEMOLYZED,RECOLLECT REQUESTED 3.5 - 5.1 mmol/L    Chloride 109 (H) 97 - 108 mmol/L    CO2 25 21 - 32 mmol/L    Anion Gap 4 (L) 5 - 15 mmol/L    Glucose 141 (H) 65 - 100 mg/dL    BUN 9 6 - 20 MG/DL    Creatinine 4.31 (L) 0.70 - 1.30 MG/DL    Bun/Cre Ratio 13 12 - 20      Est, Glom Filt Rate >60 >60 ml/min/1.56m2    Calcium 8.0 (L) 8.5 - 10.1 MG/DL   Phosphorus    Collection Time: 10/20/22 12:04 AM   Result Value Ref Range    Phosphorus 2.4 (L) 2.6 - 4.7 MG/DL   POCT Glucose    Collection Time: 10/20/22  5:28 AM   Result Value Ref Range    POC Glucose 101 65 - 117 mg/dL    Performed by: Sarita Haver            Assessment:     Principal Problem:    Colostomy dysfunction ([facility])  Active Problems:    Type II diabetes mellitus (H[facility])    Intra-abdominal adhesions  Resolved Problems:    * No resolved hospital problems. 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     Hemodynamically stable.  Hemoglobin adequate.  Good urine output.  Creatinine within normal limits.  Still hypophosphatemic.  No leukocytosis.  Awaiting return of GI function.    Plan:   Begin full liquid diet.  Replace phosphorus.  Ambulate.  Physical therapy.  Incentive spirometer.

## 2022-10-20 NOTE — Care Coordination-Inpatient (Signed)
Care Management Initial Assessment       RUR: 9% Low   Readmission? No  1st IM letter given? Yes -  1st [employer] letter given: NA    CM met with patient and wife at bedside to introduce self and explain role. Patient lives with his wife in a 2 story home with 4 steps to enter and 14 steps to enter the 2nd floor. Patient has a Sports administrator bed/bath set-up. Patient was independent at baseline with ADL's, IADL's and ambulation. Patient was admitted with a current ostomy. CM discussed possible HH SN need for ostomy; wife and patient report they are comfortable with tending to ostomy needs independently.  Patient's wife will provide transport home once medically stable. Patient's PCP is Dr. Wenda Low with [facility] in [employer]. (P: [phone]).     10/20/22 1434   Service Assessment   Patient Orientation Alert and Oriented;Person;Place;Self;Situation   Cognition Alert   History Provided By Patient   Primary Caregiver Self   Accompanied By/Relationship Wife   Support Systems Spouse/Significant Other   Patient's Healthcare Decision Maker is: Legal Next of [place]   PCP Verified by CM Yes  (Dr. Wenda Low [facility] in [place]))   Last Visit to PCP Within last 6 months   Prior Functional Level Independent in ADLs/IADLs   Current Functional Level Independent in ADLs/IADLs   Can patient return to prior living arrangement Yes   Ability to make needs known: Good   Family able to assist with home care needs: Yes   Would you like for me to discuss the discharge plan with any other family members/significant others, and if so, who? Yes  (Upon patient request)   Financial Resources [employer] Resources None   Social/Functional History   Lives With Spouse   Type of [place] Two level   ADL Assistance Independent   Primary school teacher Independent   Discharge Planning   Type of [place] Spouse/Significant Other   Current Services Prior To Admission Durable Medical Equipment   Current DME Prior to [place] Other (Comment)  (Ostomy)   Potential Assistance Needed N/A   DME Ordered? No   Potential Assistance Purchasing Medications No   Type of Home Care Services None   Patient expects to be discharged to: [facility], [facility]   [phone]

## 2022-10-20 NOTE — Wound Image (Signed)
WOCN Ostomy Progress Note:     Second postoperative visit.  Surgery: colostomy revision  Date of Surgery: 10.19.23      Surgeon: Dr. Marge Duncans   Stoma Location: left upper quadrant    Ostomy Assessment:  Stoma type: end colostomy  Stoma appearance: red moist and edematous  Mucocutaneous Junction: intact  Peristomal skin: intact with skin glue  Wound: midline incision with staples  Output: small brown  Current appliance: 1 piece pouch with ring    Treatments:  Pouch removed, stoma and peristomal skin cleaned and assessed, new pouch prepared and applied.        Recommendations:  1. Empty appliance when 1/3 full and PRN. Encourage patient/family to notify nurse and  assist w/ pouch emptying to promote self-care. Change appliance twice weekly and PRN for leaking ASAP. DO NOT REINFORCE LEAKS to avoid skin breakdown.    Transition of Care:  Will follow routinely.    Lauris Chroman, BSN RN [facility]  [facility] Inpatient Wound Care  Available on Perfect [employer]  Office [phone]

## 2022-10-21 LAB — CBC WITH AUTO DIFFERENTIAL
Absolute Immature Granulocyte: 0 10*3/uL (ref 0.00–0.04)
Basophils %: 0 % (ref 0–1)
Basophils Absolute: 0 10*3/uL (ref 0.0–0.1)
Eosinophils %: 6 % (ref 0–7)
Eosinophils Absolute: 0.4 10*3/uL (ref 0.0–0.4)
Hematocrit: 36.7 % (ref 36.6–50.3)
Hemoglobin: 11.1 g/dL — ABNORMAL LOW (ref 12.1–17.0)
Immature Granulocytes: 0 % (ref 0.0–0.5)
Lymphocytes %: 19 % (ref 12–49)
Lymphocytes Absolute: 1.2 10*3/uL (ref 0.8–3.5)
MCH: 21.8 PG — ABNORMAL LOW (ref 26.0–34.0)
MCHC: 30.2 g/dL (ref 30.0–36.5)
MCV: 72 FL — ABNORMAL LOW (ref 80.0–99.0)
MPV: 10.3 FL (ref 8.9–12.9)
Monocytes %: 7 % (ref 5–13)
Monocytes Absolute: 0.5 10*3/uL (ref 0.0–1.0)
Neutrophils %: 68 % (ref 32–75)
Neutrophils Absolute: 4.2 10*3/uL (ref 1.8–8.0)
Nucleated RBCs: 0 PER 100 WBC
Platelets: 195 10*3/uL (ref 150–400)
RBC: 5.1 M/uL (ref 4.10–5.70)
RDW: 16.7 % — ABNORMAL HIGH (ref 11.5–14.5)
WBC: 6.3 10*3/uL (ref 4.1–11.1)
nRBC: 0 10*3/uL (ref 0.00–0.01)

## 2022-10-21 LAB — BASIC METABOLIC PANEL
Anion Gap: 7 mmol/L (ref 5–15)
BUN: 7 MG/DL (ref 6–20)
Bun/Cre Ratio: 10 — ABNORMAL LOW (ref 12–20)
CO2: 24 mmol/L (ref 21–32)
Calcium: 8.3 MG/DL — ABNORMAL LOW (ref 8.5–10.1)
Chloride: 107 mmol/L (ref 97–108)
Creatinine: 0.7 MG/DL (ref 0.70–1.30)
Est, Glom Filt Rate: >60 mL/min/{1.73_m2} (ref >60)
Glucose: 234 mg/dL — ABNORMAL HIGH (ref 65–100)
Potassium: 3.9 mmol/L (ref 3.5–5.1)
Sodium: 138 mmol/L (ref 136–145)

## 2022-10-21 LAB — POCT GLUCOSE
POC Glucose: 248 mg/dL — ABNORMAL HIGH (ref 65–117)
POC Glucose: 171 mg/dL — ABNORMAL HIGH (ref 65–117)
POC Glucose: 230 mg/dL — ABNORMAL HIGH (ref 65–117)
POC Glucose: 142 mg/dL — ABNORMAL HIGH (ref 65–117)

## 2022-10-21 MED ORDER — ZOLPIDEM TARTRATE 5 MG PO TABS
5 MG | Freq: Every evening | ORAL | Status: AC
Start: 2022-10-21 — End: 2022-10-23
  Administered 2022-10-22 – 2022-10-23 (×2): 10 mg via ORAL

## 2022-10-21 MED FILL — LANTUS 100 UNIT/ML SC SOLN: 100 UNIT/ML | SUBCUTANEOUS | Qty: 1

## 2022-10-21 MED FILL — ZOLPIDEM TARTRATE 5 MG PO TABS: 5 MG | ORAL | Qty: 1

## 2022-10-21 MED FILL — HUMALOG 100 UNIT/ML IJ SOLN: 100 UNIT/ML | INTRAMUSCULAR | Qty: 1

## 2022-10-21 NOTE — Progress Notes (Signed)
General Daily Progress Note    Admission Date: 10/16/2022  Hospital Day 6  Post-Op Day 5  Subjective:   Tolerating small quantities of full liquid diet.  Some gas and a small amount of stool from ostomy.  Voiding well.  Pain control adequate.  Didn't sleep well.        Objective:   Patient Vitals for the past 24 hrs:   BP Temp Temp src Pulse Resp SpO2   10/21/22 0640 128/77 -- -- 74 -- --   10/20/22 2023 138/72 98.6 F (37 C) Oral 74 18 94 %   10/20/22 1715 121/66 99.3 F (37.4 C) Oral 74 18 --     No intake/output data recorded.  10/22 1901 - 10/24 0700  In: 1823.3 [P.O.:200; I.V.:1623.3]  Out: 2980 [Urine:2900]    Physical Examination:  General Appearance - No distress.  Abdomen - Incision clean, dry, intact, and without erythema.  Colostomy edematous but viable.  Extremities - Pneumatic compression stockings in use.           Data Review   Recent Results (from the past 24 hour(s))   POCT Glucose    Collection Time: 10/20/22 11:37 AM   Result Value Ref Range    POC Glucose 150 (H) 65 - 117 mg/dL    Performed by: Helyn App    POCT Glucose    Collection Time: 10/20/22  4:29 PM   Result Value Ref Range    POC Glucose 233 (H) 65 - 117 mg/dL    Performed by: Helyn App    POCT Glucose    Collection Time: 10/20/22  9:25 PM   Result Value Ref Range    POC Glucose 248 (H) 65 - 117 mg/dL    Performed by: Mohammed Kindle    CBC with Auto Differential    Collection Time: 10/21/22 12:35 AM   Result Value Ref Range    WBC 6.3 4.1 - 11.1 K/uL    RBC 5.10 4.10 - 5.70 M/uL    Hemoglobin 11.1 (L) 12.1 - 17.0 g/dL    Hematocrit 66.4 40.3 - 50.3 %    MCV 72.0 (L) 80.0 - 99.0 FL    MCH 21.8 (L) 26.0 - 34.0 PG    MCHC 30.2 30.0 - 36.5 g/dL    RDW 47.4 (H) 25.9 - 14.5 %    Platelets 195 150 - 400 K/uL    MPV 10.3 8.9 - 12.9 FL    Nucleated RBCs 0.0 0 PER 100 WBC    nRBC 0.00 0.00 - 0.01 K/uL    Neutrophils % 68 32 - 75 %    Lymphocytes % 19 12 - 49 %    Monocytes % 7 5 - 13 %    Eosinophils % 6 0 - 7 %    Basophils % 0 0 -  1 %    Immature Granulocytes 0 0.0 - 0.5 %    Neutrophils Absolute 4.2 1.8 - 8.0 K/UL    Lymphocytes Absolute 1.2 0.8 - 3.5 K/UL    Monocytes Absolute 0.5 0.0 - 1.0 K/UL    Eosinophils Absolute 0.4 0.0 - 0.4 K/UL    Basophils Absolute 0.0 0.0 - 0.1 K/UL    Absolute Immature Granulocyte 0.0 0.00 - 0.04 K/UL    Differential Type AUTOMATED     Basic Metabolic Panel    Collection Time: 10/21/22 12:35 AM   Result Value Ref Range    Sodium 138 136 - 145 mmol/L  Potassium 3.9 3.5 - 5.1 mmol/L    Chloride 107 97 - 108 mmol/L    CO2 24 21 - 32 mmol/L    Anion Gap 7 5 - 15 mmol/L    Glucose 234 (H) 65 - 100 mg/dL    BUN 7 6 - 20 MG/DL    Creatinine 0.70 0.70 - 1.30 MG/DL    Bun/Cre Ratio 10 (L) 12 - 20      Est, Glom Filt Rate >60 >60 ml/min/1.51m2    Calcium 8.3 (L) 8.5 - 10.1 MG/DL   POCT Glucose    Collection Time: 10/21/22  6:27 AM   Result Value Ref Range    POC Glucose 171 (H) 65 - 117 mg/dL    Performed by: Gwendlyn Deutscher         Portable CXR (10/18/2022):  Low lung volumes.      Assessment:     Principal Problem:    Colostomy dysfunction (HCC)  Active Problems:    Type II diabetes mellitus ([place])    Intra-abdominal adhesions  Resolved Problems:    * No resolved hospital problems. 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     Hemodynamically stable.  Hemoglobin stable.  Urine output adequate.  Creatinine within normal limits.  No leukocytosis.  GI function is returning.    Plan:   Begin low fiber diabetic diet.  Increase Ambien to home dose.  Ambulate.  Physical therapy.  Incentive spirometer.    Possible discharge tomorrow.

## 2022-10-21 NOTE — Wound Image (Signed)
WOCN Ostomy Progress Note:     Third postoperative visit.  Surgery: colostomy revision  Date of Surgery: 10.19.23      Surgeon: Dr. Marge Duncans   Stoma Location: left upper quadrant    Ostomy Assessment:  Stoma type: end colostomy  Stoma appearance: red moist and edematous  Wound: midline incision with staples  Output: small brown  Current appliance: 1 piece pouch with ring    Picture taken 10.23.23      Recommendations:  1. Empty appliance when 1/3 full and PRN. Encourage patient/family to notify nurse and  assist w/ pouch emptying to promote self-care. Change appliance twice weekly and PRN for leaking ASAP. DO NOT REINFORCE LEAKS to avoid skin breakdown.    Transition of Care:  Will follow routinely.  Provided ordering information and supplies.    Lauris Chroman, BSN RN [facility]  [facility] Inpatient Wound Care  Available on Perfect Serve  Office [phone]

## 2022-10-22 ENCOUNTER — Inpatient Hospital Stay: Admit: 2022-10-22 | Payer: MEDICARE

## 2022-10-22 DIAGNOSIS — R059 Cough, unspecified: Secondary | ICD-10-CM | POA: Diagnosis not present

## 2022-10-22 LAB — POCT GLUCOSE
POC Glucose: 185 mg/dL — ABNORMAL HIGH (ref 65–117)
POC Glucose: 122 mg/dL — ABNORMAL HIGH (ref 65–117)
POC Glucose: 209 mg/dL — ABNORMAL HIGH (ref 65–117)
POC Glucose: 186 mg/dL — ABNORMAL HIGH (ref 65–117)

## 2022-10-22 LAB — CBC
Hematocrit: 40.1 % (ref 36.6–50.3)
Hemoglobin: 12 g/dL — ABNORMAL LOW (ref 12.1–17.0)
MCH: 21.4 PG — ABNORMAL LOW (ref 26.0–34.0)
MCHC: 29.9 g/dL — ABNORMAL LOW (ref 30.0–36.5)
MCV: 71.5 FL — ABNORMAL LOW (ref 80.0–99.0)
MPV: 10.6 FL (ref 8.9–12.9)
Nucleated RBCs: 0 PER 100 WBC
Platelets: 220 10*3/uL (ref 150–400)
RBC: 5.61 M/uL (ref 4.10–5.70)
RDW: 17.2 % — ABNORMAL HIGH (ref 11.5–14.5)
WBC: 8.9 10*3/uL (ref 4.1–11.1)
nRBC: 0 10*3/uL (ref 0.00–0.01)

## 2022-10-22 LAB — EXTRA TUBES HOLD

## 2022-10-22 MED ORDER — FLUCONAZOLE 100 MG PO TABS
100 MG | Freq: Every day | ORAL | Status: AC
Start: 2022-10-22 — End: 2022-10-23
  Administered 2022-10-22 – 2022-10-23 (×2): 100 mg via ORAL

## 2022-10-22 MED ORDER — CETIRIZINE HCL 10 MG PO TABS
10 MG | Freq: Every day | ORAL | Status: DC
Start: 2022-10-22 — End: 2022-10-23
  Administered 2022-10-22 – 2022-10-23 (×2): 10 mg via ORAL

## 2022-10-22 MED FILL — FLUCONAZOLE 100 MG PO TABS: 100 MG | ORAL | Qty: 1

## 2022-10-22 MED FILL — CETIRIZINE HCL 10 MG PO TABS: 10 MG | ORAL | Qty: 1

## 2022-10-22 MED FILL — LANTUS 100 UNIT/ML SC SOLN: 100 UNIT/ML | SUBCUTANEOUS | Qty: 1

## 2022-10-22 MED FILL — ZOLPIDEM TARTRATE 5 MG PO TABS: 5 MG | ORAL | Qty: 2

## 2022-10-22 MED FILL — HUMALOG 100 UNIT/ML IJ SOLN: 100 UNIT/ML | INTRAMUSCULAR | Qty: 2

## 2022-10-22 NOTE — Progress Notes (Signed)
General Daily Progress Note    Admission Date: 10/16/2022  Hospital Day 7  Post-Op Day 6  Subjective:   One small episode of emesis last night.  Sore throat has not improved.  Mild non-productive cough.  No dyspnea.  T 100.4 yesterday evening.        Objective:   Patient Vitals for the past 24 hrs:   BP Temp Temp src Pulse Resp SpO2   10/22/22 0836 118/68 99.1 F (37.3 C) Oral 74 16 97 %   10/22/22 0647 130/79 98.2 F (36.8 C) Oral 74 -- --   10/21/22 2011 134/81 99 F (37.2 C) Oral 80 18 97 %   10/21/22 1810 (!) 144/83 98.6 F (37 C) Oral 79 16 96 %   10/21/22 1631 138/83 100.4 F (38 C) Oral 77 16 97 %   10/21/22 1601 (!) 153/79 98.8 F (37.1 C) Oral 79 16 99 %     No intake/output data recorded.  10/23 1901 - 10/25 0700  In: 120 [P.O.:120]  Out: 1055 [Urine:1000]    Physical Examination:  General Appearance - No distress.  HEENT - Coated tongue c/w thrush.  Abdomen - Incision clean, dry, intact, and without erythema.  Colostomy edematous but viable and functioning.  Extremities - Pneumatic compression stockings in use.             Data Review   Recent Results (from the past 24 hour(s))   POCT Glucose    Collection Time: 10/21/22 12:02 PM   Result Value Ref Range    POC Glucose 230 (H) 65 - 117 mg/dL    Performed by: Irven Coe Charmoine  PCT    POCT Glucose    Collection Time: 10/21/22  5:03 PM   Result Value Ref Range    POC Glucose 142 (H) 65 - 117 mg/dL    Performed by: Irven Coe Charmoine  PCT    POCT Glucose    Collection Time: 10/21/22  8:59 PM   Result Value Ref Range    POC Glucose 185 (H) 65 - 117 mg/dL    Performed by: Beatris Si    CBC    Collection Time: 10/22/22  1:40 AM   Result Value Ref Range    WBC 8.9 4.1 - 11.1 K/uL    RBC 5.61 4.10 - 5.70 M/uL    Hemoglobin 12.0 (L) 12.1 - 17.0 g/dL    Hematocrit 40.1 36.6 - 50.3 %    MCV 71.5 (L) 80.0 - 99.0 FL    MCH 21.4 (L) 26.0 - 34.0 PG    MCHC 29.9 (L) 30.0 - 36.5 g/dL    RDW 17.2 (H) 11.5 - 14.5 %    Platelets 220 150 - 400  K/uL    MPV 10.6 8.9 - 12.9 FL    Nucleated RBCs 0.0 0 PER 100 WBC    nRBC 0.00 0.00 - 0.01 K/uL   Extra Tubes Hold    Collection Time: 10/22/22  1:40 AM   Result Value Ref Range    Specimen HOld 1SST     Comment:        Add-on orders for these samples will be processed based on acceptable specimen integrity and analyte stability, which may vary by analyte.   POCT Glucose    Collection Time: 10/22/22  6:24 AM   Result Value Ref Range    POC Glucose 122 (H) 65 - 117 mg/dL    Performed by: Beatris Si      Portable  CXR (10/18/2022):  Low lung volumes.      Assessment:     Principal Problem:    Colostomy dysfunction (HCC)  Active Problems:    Type II diabetes mellitus (HCC)    Intra-abdominal adhesions  Resolved Problems:    * No resolved hospital problems. *      6 Days Post-Op s/p open lysis of adhesions, excision of coloanal J-pouch, revision of colostomy and repair of parastomal hernia with Parietene DS mesh.     Hemodynamically stable.  Hemoglobin stable.  Urine output adequate.  Creatinine within normal limits.  No leukocytosis.    Sore throat probably still from Candidiasis, but also had a low grade fever and a cough.    GI function is returning, but patient probably needs one more day to prove that he can tolerate enough oral nutrition.      Plan:   Continue low fiber diabetic diet.  Add oral Diflucan.  Add Zyrtec.  Chest radiographs.  Ambulate.  Physical therapy.  Incentive spirometer.     Probable discharge tomorrow.          Probable discharge tomorrow.

## 2022-10-22 NOTE — Wound Image (Signed)
WOCN Ostomy Progress Note:     Postoperative visit.  Surgery: colostomy revision  Date of Surgery: 10.19.23      Surgeon: Dr. Marge Duncans   Stoma Location: left upper quadrant    Ostomy Assessment:  Stoma type: end colostomy  Stoma appearance: red moist and edematous  Mucocutaneous junction intact  Peristomal skin intact with skin glue  Wound: midline incision with staples  Output: soft brown  Current appliance: 1 piece pouch with ring    Treatments:  Pouch removed, stoma and peristomal skin cleaned and assessed, new pouch prepared and applied.      Picture taken 10.23.23      Recommendations:  1. Empty appliance when 1/3 full and PRN. Encourage patient/family to notify nurse and  assist w/ pouch emptying to promote self-care. Change appliance twice weekly and PRN for leaking ASAP. DO NOT REINFORCE LEAKS to avoid skin breakdown.    Transition of Care:  Will follow routinely.  Ordering information and supplies at the bedside.    Lauris Chroman, BSN RN [facility]  [facility] Inpatient [facility]ound [facility]  Available on [employer]  Office [phone]

## 2022-10-22 NOTE — Progress Notes (Incomplete)
0730 Bedside and Verbal shift change report given to [facility] (Soil scientist) by Horald Pollen (offgoing nurse). Report included the following information Nurse Handoff Report, Index, ED Encounter Summary, ED SBAR, Adult Overview, Surgery Report, Intake/Output, and MAR.      33 Patient's sister called Network engineer to express her concern about construction outside of the patient's room, stated she had engineering to complain. This RN went and checked on patient who stated he had already spoke to the nursing supervisor at the bedside and that he was not upset, but stated he was worried about staff safety and other patients.

## 2022-10-23 LAB — POCT GLUCOSE
POC Glucose: 189 mg/dL — ABNORMAL HIGH (ref 65–117)
POC Glucose: 190 mg/dL — ABNORMAL HIGH (ref 65–117)
POC Glucose: 213 mg/dL — ABNORMAL HIGH (ref 65–117)
POC Glucose: 217 mg/dL — ABNORMAL HIGH (ref 65–117)

## 2022-10-23 LAB — BASIC METABOLIC PANEL
Anion Gap: 9 mmol/L (ref 5–15)
BUN: 12 MG/DL (ref 6–20)
Bun/Cre Ratio: 16 (ref 12–20)
CO2: 20 mmol/L — ABNORMAL LOW (ref 21–32)
Calcium: 8.8 MG/DL (ref 8.5–10.1)
Chloride: 105 mmol/L (ref 97–108)
Creatinine: 0.73 MG/DL (ref 0.70–1.30)
Est, Glom Filt Rate: >60 mL/min/{1.73_m2} (ref >60)
Glucose: 256 mg/dL — ABNORMAL HIGH (ref 65–100)
Potassium: 4.1 mmol/L (ref 3.5–5.1)
Sodium: 134 mmol/L — ABNORMAL LOW (ref 136–145)

## 2022-10-23 LAB — CBC
Hematocrit: 36.4 % — ABNORMAL LOW (ref 36.6–50.3)
Hemoglobin: 11.2 g/dL — ABNORMAL LOW (ref 12.1–17.0)
MCH: 21.7 PG — ABNORMAL LOW (ref 26.0–34.0)
MCHC: 30.8 g/dL (ref 30.0–36.5)
MCV: 70.5 FL — ABNORMAL LOW (ref 80.0–99.0)
Nucleated RBCs: 0 PER 100 WBC
Platelets: 228 10*3/uL (ref 150–400)
RBC: 5.16 M/uL (ref 4.10–5.70)
RDW: 17.1 % — ABNORMAL HIGH (ref 11.5–14.5)
WBC: 9.1 10*3/uL (ref 4.1–11.1)
nRBC: 0 10*3/uL (ref 0.00–0.01)

## 2022-10-23 LAB — PHOSPHORUS: Phosphorus: 3.2 MG/DL (ref 2.6–4.7)

## 2022-10-23 MED ORDER — NYSTATIN 100000 UNIT/ML MT SUSP
100000 UNIT/ML | Freq: Four times a day (QID) | OROMUCOSAL | 0 refills | Status: AC
Start: 2022-10-23 — End: 2022-10-30

## 2022-10-23 MED ORDER — OXYCODONE HCL 5 MG PO TABS
5 MG | ORAL_TABLET | ORAL | 0 refills | Status: AC | PRN
Start: 2022-10-23 — End: 2022-10-30

## 2022-10-23 MED ORDER — FLUCONAZOLE 100 MG PO TABS
100 MG | ORAL_TABLET | Freq: Every day | ORAL | 0 refills | Status: AC
Start: 2022-10-23 — End: 2022-10-30

## 2022-10-23 MED FILL — LANTUS 100 UNIT/ML SC SOLN: 100 UNIT/ML | SUBCUTANEOUS | Qty: 35

## 2022-10-23 MED FILL — ZOLPIDEM TARTRATE 5 MG PO TABS: 5 MG | ORAL | Qty: 2

## 2022-10-23 NOTE — Progress Notes (Signed)
General Daily Progress Note    Admission Date: 10/16/2022  Hospital Day 8  Post-Op Day 7  Subjective:   Good pain control.  Tolerating food.  Ambulating well.  Voiding well.  Throat a bit less sore.  No dyspnea.      Objective:   Patient Vitals for the past 24 hrs:   BP Temp Temp src Pulse Resp SpO2 Weight   10/23/22 0843 97/71 98.2 F (36.8 C) Oral 72 18 96 % --   10/23/22 0659 -- -- -- -- -- -- 95.8 kg (211 lb 4.8 oz)   10/23/22 0656 122/74 98.8 F (37.1 C) -- 79 18 93 % --   10/22/22 2134 -- -- -- -- 16 -- --   10/22/22 2018 119/67 99.7 F (37.6 C) Oral 77 18 98 % --   10/22/22 1635 (!) 147/87 99 F (37.2 C) Oral 78 16 97 % --     No intake/output data recorded.  10/24 1901 - 10/26 0700  In: 120 [P.O.:120]  Out: 200 [Urine:200]    Physical Examination:  General Appearance - No distress.  HEENT - Decreased coating of tongue.  Abdomen - Incision clean, dry, intact, and without erythema; staples in place.  Colostomy edematous but viable and functioning.         Data Review   Recent Results (from the past 24 hour(s))   POCT Glucose    Collection Time: 10/22/22 12:12 PM   Result Value Ref Range    POC Glucose 209 (H) 65 - 117 mg/dL    Performed by: Irven Coe Charmoine  PCT    POCT Glucose    Collection Time: 10/22/22  4:25 PM   Result Value Ref Range    POC Glucose 186 (H) 65 - 117 mg/dL    Performed by: Irven Coe Charmoine  PCT    POCT Glucose    Collection Time: 10/22/22  8:23 PM   Result Value Ref Range    POC Glucose 189 (H) 65 - 117 mg/dL    Performed by: Dagoberto Ligas    POCT Glucose    Collection Time: 10/22/22  9:11 PM   Result Value Ref Range    POC Glucose 190 (H) 65 - 117 mg/dL    Performed by: Delbert Phenix PCT    CBC    Collection Time: 10/23/22 12:12 AM   Result Value Ref Range    WBC 9.1 4.1 - 11.1 K/uL    RBC 5.16 4.10 - 5.70 M/uL    Hemoglobin 11.2 (L) 12.1 - 17.0 g/dL    Hematocrit 36.4 (L) 36.6 - 50.3 %    MCV 70.5 (L) 80.0 - 99.0 FL    MCH 21.7 (L) 26.0 - 34.0 PG    MCHC  30.8 30.0 - 36.5 g/dL    RDW 17.1 (H) 11.5 - 14.5 %    Platelets 228 150 - 400 K/uL    Nucleated RBCs 0.0 0 PER 100 WBC    nRBC 0.00 0.00 - 0.01 K/uL   Basic Metabolic Panel    Collection Time: 10/23/22 12:12 AM   Result Value Ref Range    Sodium 134 (L) 136 - 145 mmol/L    Potassium 4.1 3.5 - 5.1 mmol/L    Chloride 105 97 - 108 mmol/L    CO2 20 (L) 21 - 32 mmol/L    Anion Gap 9 5 - 15 mmol/L    Glucose 256 (H) 65 - 100 mg/dL    BUN 12 6 -  20 MG/DL    Creatinine 3.66 4.40 - 1.30 MG/DL    Bun/Cre Ratio 16 12 - 20      Est, Glom Filt Rate >60 >60 ml/min/1.79m2    Calcium 8.8 8.5 - 10.1 MG/DL   Phosphorus    Collection Time: 10/23/22 12:12 AM   Result Value Ref Range    Phosphorus 3.2 2.6 - 4.7 MG/DL   POCT Glucose    Collection Time: 10/23/22  6:53 AM   Result Value Ref Range    POC Glucose 213 (H) 65 - 117 mg/dL    Performed by: Retta Mac PCT      Portable CXR (10/18/2022):  Low lung volumes.    PA and Lateral CXR (10/22/2022):  No pneumonia.      Assessment:     Principal Problem:    Colostomy dysfunction (HCC)  Active Problems:    Type II diabetes mellitus (HCC)    Intra-abdominal adhesions  Resolved Problems:    * No resolved hospital problems. *      7 Days Post-Op  s/p open lysis of adhesions, excision of coloanal J-pouch, revision of colostomy and repair of parastomal hernia with Parietene DS mesh.     Hemodynamically stable.  Hemoglobin stable.  Urine output adequate.  Still having some low-grade fevers (Tmax 99.7), but no leukocytosis or apparent site of bacterial infection.    GI function is returning.    The patient appears to be fit for discharge to home.    Plan:   Discharge to home on oral Diflucan and Nystatin swish and swallow.    Follow-up on 10/30/2022.

## 2022-10-23 NOTE — Progress Notes (Signed)
Discharge instructions and medications reviewed with the patient, spouse, and caregiver and appropriate educational materials and side effects teaching were provided.

## 2022-10-23 NOTE — Discharge Summary (Incomplete)
Discharge Summary     Patient ID:    Evan Shepherd  [id number]  66 y.o.  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]  Colostomy dysfunction (HCC) [K94.03]    Chronic Diagnoses:    Patient Active Problem List   Diagnosis    Fecal incontinence    Type II diabetes mellitus ([place])    Hypertension    Colostomy dysfunction ([place])    Intra-abdominal adhesions       Discharge Diagnoses:      Procedure(s) Performed:  Procedure(s):  OPEN LYSIS OF ADHESIONS, EXCISION OF COLOANAL J POUCH, COLOSTOMY REVISION WITH REPAIR OF PARASTOMAL HERNIA  WITH PARIETENE DS MESH  (GEN/EPIDURAL) (E R A S)  CYSTOSCOPY WITH INSERTION BILATERAL URETERAL CATHETERS  Surgical      Discharge Medications:   Current Discharge Medication List        START taking these medications    Details   nystatin (MYCOSTATIN) 100000 UNIT/ML suspension Take 5 mLs by mouth 4 times daily for 7 days  Qty: 150 mL, Refills: 0      fluconazole (DIFLUCAN) 100 MG tablet Take 1 tablet by mouth daily for 7 days  Qty: 7 tablet, Refills: 0      oxyCODONE (ROXICODONE) 5 MG immediate release tablet Take 1 tablet by mouth every 4 hours as needed for Pain (acute post-operative pain) for up to 7 days. Max Daily Amount: 30 mg  Qty: 20 tablet, Refills: 0    Comments: Reduce doses taken as pain becomes manageable  Associated Diagnoses: Acute post-operative pain           CONTINUE these medications which have NOT CHANGED    Details   carvedilol (COREG) 6.25 MG tablet Take 18.25 mg by mouth 2 times daily (with meals)      TURMERIC PO Take by mouth daily      acetaminophen (TYLENOL) 500 MG tablet Take 2 tablets by mouth every 6 hours      amLODIPine (NORVASC) 10 MG tablet Take 1 tablet by mouth every evening      Cholecalciferol 50 MCG (2000 UT) TABS Take 2.5 tablets by mouth daily      empagliflozin (JARDIANCE) 25 MG tablet Take 1 tablet by mouth  daily      gabapentin (NEURONTIN) 300 MG capsule Take 1 capsule by mouth 2 times daily.      insulin aspart (NOVOLOG) 100 UNIT/ML injection pen Inject into the skin 3 times daily (before meals)      insulin glargine (LANTUS SOLOSTAR) 100 UNIT/ML injection pen Inject 52 Units into the skin nightly      ipratropium (ATROVENT) 0.06 % nasal spray 2 sprays by Nasal route 2 times daily      lisinopril (PRINIVIL;ZESTRIL) 40 MG tablet Take 1 tablet by mouth daily      metFORMIN (GLUCOPHAGE) 850 MG tablet Take 1 tablet by mouth 2 times daily (with meals)      pantoprazole (PROTONIX) 40 MG tablet Take 1 tablet by mouth 2 times daily      rosuvastatin (CRESTOR) 10 MG tablet Take 1 tablet by mouth nightly      valACYclovir (VALTREX) 1 g tablet Take 1 tablet by mouth daily      zolpidem (AMBIEN) 10 MG tablet Take by mouth.              Diet: {diet:18262}    Activity: {  discharge activity:18261}    Wound Care:  {wound NOMV:67209    Discharge Condition: {condition:30010686}    Disposition:      Follow-up Care:  Dr. Nanda Quinton at 12:30 PM on 10/29/2022    Significant Diagnostic Studies:   Recent Labs     10/22/22  0140 10/23/22  0012   WBC 8.9 9.1   HGB 12.0* 11.2*   HCT 40.1 36.4*   PLT 220 228     Recent Labs     10/21/22  0035 10/23/22  0012   NA 138 134*   K 3.9 4.1   CL 107 105   CO2 24 20*   BUN 7 12   PHOS  --  3.2           HOSPITAL COURSE & TREATMENT RENDERED:   1. ***            Signed:  Jake Shark, MD  10/23/2022  9:33 AM

## 2022-10-23 NOTE — Discharge Instructions (Signed)
Discharge Instructions      Do not lift any objects weighing more than 10 pounds for 4 weeks after the surgery date.    Do not do any housework including vacuuming, scrubbing or yardwork for 4 weeks after the surgery date.    Do not drive for 4 weeks after the surgery date or while taking sedating medications.    You should walk frequently and you should go up and down stairs as desired.    You may shower, but do not submerge your abdomen.  Do not take tub baths, swim, or use hot tubs for the next 4 weeks.    The skin staples will be removed in the office.    Continue the low fiber diabetic diet for the next 2 weeks. (See handbook for additional details).     Drink nutritional supplements 2 times per day until your diet and appetite are back to normal. If you are diabetic, drink one-half bottle 4 times per day.     Take maximum doses of Tylenol (1000 mg every 6 hours) until you do not need pain medication any more.  You may take other pain medication (oxycodone) as prescribed in addition to the Tylenol (but not in place of it) if you have already had as much Tylenol as you are allowed to have and your pain is not controlled well enough.  Take the antifungal medications (fluconazole and Nystatin) as prescribed.    Follow up:  Please return to Dr. Thornton Park office at 12:30 PM on Wednesday 10/29/2022.  If you need to change the appointment, please call [phone] on a weekday between 9:00 AM and noon.    Please bring your supplies to the first office visit with your surgeon.     If you experience fever (temperature greater than 100.5), chills, vomiting, redness around an incision, or infected-looking drainage from an incision, please contact Dr. Thornton Park office at [phone].    Please see the [facility] handbook for additional instructions.  If you have further questions, please call Dr. Thornton Park office at [phone].

## 2022-10-23 NOTE — Care Coordination-Inpatient (Addendum)
Transition of Care Plan:    RUR:  8% Low   Prior Level of Functioning: Independent   Disposition: Home   Follow up appointments: PCP, specialist  DME needed: No DME needs   Transportation at discharge: Wife  IM/IMM Medicare/Tricare letter given: 1st IM: 10/20 & 2nd IM: 10/26  Is patient a Veteran and connected with [place]? NA  Caregiver Contact: Wife, Blandon Offerdahl, [phone]  Discharge Caregiver contacted prior to discharge? Yes, made aware of [place] needed? No   Barriers to discharge: None    10:50AM - CM noted DC order. Patient and wife declined [facility] SN services assistance. Wife to provide transport home today, Thursday, 10/26. No further CM needs.     Miquel Dunn, MSW   [phone]

## 2022-11-03 DIAGNOSIS — R531 Weakness: Secondary | ICD-10-CM | POA: Diagnosis not present

## 2022-11-03 DIAGNOSIS — K469 Unspecified abdominal hernia without obstruction or gangrene: Secondary | ICD-10-CM | POA: Diagnosis not present

## 2022-11-03 DIAGNOSIS — R6883 Chills (without fever): Secondary | ICD-10-CM | POA: Diagnosis not present

## 2022-11-10 DIAGNOSIS — Z933 Colostomy status: Secondary | ICD-10-CM | POA: Diagnosis not present

## 2022-11-12 ENCOUNTER — Encounter (HOSPITAL_COMMUNITY): Payer: Self-pay | Admitting: Nurse Practitioner

## 2022-11-18 DIAGNOSIS — D473 Essential (hemorrhagic) thrombocythemia: Secondary | ICD-10-CM | POA: Diagnosis not present

## 2022-11-26 DIAGNOSIS — E1165 Type 2 diabetes mellitus with hyperglycemia: Secondary | ICD-10-CM | POA: Diagnosis not present

## 2022-12-01 ENCOUNTER — Ambulatory Visit (HOSPITAL_COMMUNITY)
Admission: RE | Admit: 2022-12-01 | Discharge: 2022-12-01 | Disposition: A | Discharge status: Home / Self Care | Payer: Medicare HMO | Source: Ambulatory Visit | Attending: Nurse Practitioner | Admitting: Nurse Practitioner

## 2022-12-01 DIAGNOSIS — T148XXA Other injury of unspecified body region, initial encounter: Secondary | ICD-10-CM | POA: Diagnosis not present

## 2022-12-01 DIAGNOSIS — K9400 Colostomy complication, unspecified: Secondary | ICD-10-CM | POA: Diagnosis not present

## 2022-12-01 DIAGNOSIS — Z933 Colostomy status: Secondary | ICD-10-CM | POA: Insufficient documentation

## 2022-12-01 NOTE — Discharge Instructions (Signed)
Begin aquacel to open wound around stoma. Cover with barrier ring Pouch as usual

## 2022-12-01 NOTE — Progress Notes (Signed)
[facility]   Reason for visit:  LLQ colostomy.  Recent hernia repair with stoma revision.  Nonintact lesion to peristomal skin created during surgical repair. Photo in chart.  HPI:   Past Medical History:  Diagnosis Date   Coronary artery disease    DDD (degenerative disc disease), lumbar    Diabetes mellitus without complication ([place])    Dyslipidemia    History of rectal cancer    dx 2006 --  Stage III, T3  N1  M0,  s/p  chemoradiation (07-10-2005 to 09-05-2005) and anterior colon resection 11-04-2006   History of shingles    Hyperlipidemia    Hypertension    Iron deficiency anemia    intolerent PO iron--  gets IV iron,  last one 03-12-2015 ([facility])   Organic impotence    Rectal cancer (HCC)    adenocarcinoma stage lll   Family History  Problem Relation Age of Onset   Hypertension Father    Diabetes Mother    Heart attack Neg Hx    Colon cancer Neg Hx    Esophageal cancer Neg Hx    Rectal cancer Neg Hx    Stomach cancer Neg Hx    Allergies  Allergen Reactions   Cardizem Cd [Diltiazem Hcl Er Beads] Swelling   Toprol Xl [Metoprolol] Other (See Comments)    headache   Tricor [Fenofibrate] Other (See Comments)    Constipation    Current Outpatient Medications  Medication Sig Dispense Refill Last Dose   amLODipine (NORVASC) 10 MG tablet Take 10 mg by mouth daily.      empagliflozin (JARDIANCE) 25 MG TABS tablet Take 25 mg by mouth daily.      Ferrous Sulfate (IRON PO) Take 1 tablet by mouth daily.      gabapentin (NEURONTIN) 300 MG capsule Take 300 mg by mouth 3 (three) times daily.   0    insulin glargine (LANTUS) 100 UNIT/ML injection Inject 44 Units into the skin at bedtime.      labetalol (NORMODYNE) 300 MG tablet Take 300 mg by mouth 2 (two) times daily.      lactulose (CEPHULAC) 10 g packet Take 10 g by mouth every other day.      lisinopril (PRINIVIL,ZESTRIL) 40 MG tablet Take 40 mg by mouth every evening.       mesalamine (LIALDA) 1.2 g EC tablet  Take 2 tablets (2.4 g total) by mouth daily with breakfast. (Patient not taking: Reported on 05/07/2022) 60 tablet 1    metFORMIN (GLUCOPHAGE) 850 MG tablet Take 850 mg by mouth 2 (two) times daily with a meal.      metronidazole (FLAGYL) 375 MG capsule Take 375 mg by mouth in the morning, at noon, and at bedtime.      NOVOLOG FLEXPEN 100 UNIT/ML FlexPen Inject 17-45 Units into the skin See admin instructions. Take 17 to 18 units at breakfast, 25 to 30 units at lunch, and 40 to 45 units at evening  2    pantoprazole (PROTONIX) 40 MG tablet TAKE 1 TABLET TWICE DAILY 180 tablet 3    Polyethyl Glycol-Propyl Glycol (SYSTANE FREE OP) Place 1 drop into both eyes in the morning and at bedtime.      rosuvastatin (CRESTOR) 10 MG tablet Take 1 tablet (10 mg total) by mouth daily. (Patient taking differently: Take 5 mg by mouth See admin instructions. Take 5 tablets per day per patient. Patient states he splits in half) 90 tablet 3    zolpidem (AMBIEN) 10  MG tablet Take 10 mg by mouth at bedtime.      No current facility-administered medications for this encounter.   ROS  Review of Systems  Constitutional:        Is increasing activity as tolerated  HENT:         Recent vertigo  Cardiovascular:        Upcoming ablation  Gastrointestinal:        LLQ colostomy, recent revison Nonintact lesion to peristomal skin Recent hernia repair  Skin:  Positive for wound.       Peristomal wound   All other systems reviewed and are negative.  Vital signs:  BP 116/68   Pulse 74   Temp 98.7 F (37.1 C)   Resp 17   SpO2 95%  Exam:  Physical Exam Vitals reviewed.  Constitutional:      Appearance: Normal appearance.  Cardiovascular:     Comments: Upcoming ablation Neurological:     Mental Status: He is alert.     Stoma type/location:  LLQ colostomy, recent revision and hernia repair.  Stomal assessment/size:  slightly budded, round pink and moist Peristomal assessment:  0.2 cm nonintact lesion at 6  o'clock Treatment options for stomal/peristomal skin: will implement silver hydrofiber to open wound, protect with barrier ring  1 piece coloplast pouch with belt.  Output: soft brown stool.  Ostomy pouching: 1pc. Programme researcher, broadcasting/film/video Education provided:  Demonstrated wound care and sent home with additional supplies.     Impression/dx  Nonintact wound to peristomal skin  Discussion  Wound care with each pouch change.  We discussed hernia risk and being cautious about lifting, straining.   Patient with vertigo lately, feels it is related to sinus congestion.  We discuss saline nasal rinse options.  Plan  See back next week to assess wound healing.     Visit time: 45 minutes.   Domenic Moras FNP-BC

## 2022-12-08 DIAGNOSIS — Z03818 Encounter for observation for suspected exposure to other biological agents ruled out: Secondary | ICD-10-CM | POA: Diagnosis not present

## 2022-12-08 DIAGNOSIS — J069 Acute upper respiratory infection, unspecified: Secondary | ICD-10-CM | POA: Diagnosis not present

## 2022-12-09 ENCOUNTER — Emergency Department (HOSPITAL_COMMUNITY): Payer: Medicare HMO

## 2022-12-09 ENCOUNTER — Encounter (HOSPITAL_COMMUNITY): Payer: Self-pay | Admitting: Emergency Medicine

## 2022-12-09 ENCOUNTER — Observation Stay (HOSPITAL_COMMUNITY)
Admission: EM | Admit: 2022-12-09 | Discharge: 2022-12-11 | Disposition: A | Discharge status: Home / Self Care | Payer: Medicare HMO | Attending: Internal Medicine | Admitting: Internal Medicine

## 2022-12-09 ENCOUNTER — Ambulatory Visit (HOSPITAL_COMMUNITY): Payer: Medicare HMO | Admitting: Nurse Practitioner

## 2022-12-09 ENCOUNTER — Observation Stay (HOSPITAL_COMMUNITY): Payer: Medicare HMO

## 2022-12-09 DIAGNOSIS — Z794 Long term (current) use of insulin: Secondary | ICD-10-CM | POA: Insufficient documentation

## 2022-12-09 DIAGNOSIS — J111 Influenza due to unidentified influenza virus with other respiratory manifestations: Secondary | ICD-10-CM | POA: Diagnosis not present

## 2022-12-09 DIAGNOSIS — R0789 Other chest pain: Secondary | ICD-10-CM | POA: Diagnosis not present

## 2022-12-09 DIAGNOSIS — Z85048 Personal history of other malignant neoplasm of rectum, rectosigmoid junction, and anus: Secondary | ICD-10-CM | POA: Diagnosis not present

## 2022-12-09 DIAGNOSIS — R55 Syncope and collapse: Principal | ICD-10-CM | POA: Diagnosis present

## 2022-12-09 DIAGNOSIS — S2232XA Fracture of one rib, left side, initial encounter for closed fracture: Secondary | ICD-10-CM | POA: Diagnosis not present

## 2022-12-09 DIAGNOSIS — W19XXXA Unspecified fall, initial encounter: Secondary | ICD-10-CM | POA: Diagnosis not present

## 2022-12-09 DIAGNOSIS — E119 Type 2 diabetes mellitus without complications: Secondary | ICD-10-CM

## 2022-12-09 DIAGNOSIS — I2510 Atherosclerotic heart disease of native coronary artery without angina pectoris: Secondary | ICD-10-CM | POA: Diagnosis not present

## 2022-12-09 DIAGNOSIS — I10 Essential (primary) hypertension: Secondary | ICD-10-CM | POA: Diagnosis present

## 2022-12-09 DIAGNOSIS — S0990XA Unspecified injury of head, initial encounter: Secondary | ICD-10-CM | POA: Diagnosis not present

## 2022-12-09 DIAGNOSIS — R2689 Other abnormalities of gait and mobility: Secondary | ICD-10-CM | POA: Insufficient documentation

## 2022-12-09 DIAGNOSIS — S199XXA Unspecified injury of neck, initial encounter: Secondary | ICD-10-CM | POA: Diagnosis not present

## 2022-12-09 DIAGNOSIS — Z79899 Other long term (current) drug therapy: Secondary | ICD-10-CM | POA: Insufficient documentation

## 2022-12-09 DIAGNOSIS — W0110XA Fall on same level from slipping, tripping and stumbling with subsequent striking against unspecified object, initial encounter: Secondary | ICD-10-CM | POA: Diagnosis not present

## 2022-12-09 DIAGNOSIS — S2239XA Fracture of one rib, unspecified side, initial encounter for closed fracture: Secondary | ICD-10-CM | POA: Diagnosis present

## 2022-12-09 DIAGNOSIS — M47814 Spondylosis without myelopathy or radiculopathy, thoracic region: Secondary | ICD-10-CM | POA: Diagnosis not present

## 2022-12-09 DIAGNOSIS — Z7984 Long term (current) use of oral hypoglycemic drugs: Secondary | ICD-10-CM | POA: Diagnosis not present

## 2022-12-09 DIAGNOSIS — R42 Dizziness and giddiness: Secondary | ICD-10-CM | POA: Diagnosis not present

## 2022-12-09 DIAGNOSIS — S2242XA Multiple fractures of ribs, left side, initial encounter for closed fracture: Secondary | ICD-10-CM | POA: Diagnosis not present

## 2022-12-09 DIAGNOSIS — M79644 Pain in right finger(s): Secondary | ICD-10-CM | POA: Diagnosis not present

## 2022-12-09 DIAGNOSIS — M7989 Other specified soft tissue disorders: Secondary | ICD-10-CM | POA: Diagnosis not present

## 2022-12-09 DIAGNOSIS — R079 Chest pain, unspecified: Secondary | ICD-10-CM | POA: Diagnosis not present

## 2022-12-09 LAB — COMPREHENSIVE METABOLIC PANEL
ALT: 18 U/L (ref 0–44)
AST: 37 U/L (ref 15–41)
Albumin: 3.2 g/dL — ABNORMAL LOW (ref 3.5–5.0)
Alkaline Phosphatase: 59 U/L (ref 38–126)
Anion gap: 11 (ref 5–15)
BUN: 13 mg/dL (ref 8–23)
CO2: 22 mmol/L (ref 22–32)
Calcium: 8.6 mg/dL — ABNORMAL LOW (ref 8.9–10.3)
Chloride: 101 mmol/L (ref 98–111)
Creatinine, Ser: 0.97 mg/dL (ref 0.61–1.24)
GFR, Estimated: >60 mL/min (ref >60)
Glucose, Bld: 115 mg/dL — ABNORMAL HIGH (ref 70–99)
Potassium: 4.3 mmol/L (ref 3.5–5.1)
Sodium: 134 mmol/L — ABNORMAL LOW (ref 135–145)
Total Bilirubin: 0.4 mg/dL (ref 0.3–1.2)
Total Protein: 7.2 g/dL (ref 6.5–8.1)

## 2022-12-09 LAB — CBC WITH DIFFERENTIAL/PLATELET
Abs Immature Granulocytes: 0.05 10*3/uL (ref 0.00–0.07)
Basophils Absolute: 0 10*3/uL (ref 0.0–0.1)
Basophils Relative: 0 %
Eosinophils Absolute: 0.1 10*3/uL (ref 0.0–0.5)
Eosinophils Relative: 1 %
HCT: 37.9 % — ABNORMAL LOW (ref 39.0–52.0)
Hemoglobin: 11.3 g/dL — ABNORMAL LOW (ref 13.0–17.0)
Immature Granulocytes: 1 %
Lymphocytes Relative: 27 %
Lymphs Abs: 1.7 10*3/uL (ref 0.7–4.0)
MCH: 20.7 pg — ABNORMAL LOW (ref 26.0–34.0)
MCHC: 29.8 g/dL — ABNORMAL LOW (ref 30.0–36.0)
MCV: 69.5 fL — ABNORMAL LOW (ref 80.0–100.0)
Monocytes Absolute: 0.5 10*3/uL (ref 0.1–1.0)
Monocytes Relative: 8 %
Neutro Abs: 4.1 10*3/uL (ref 1.7–7.7)
Neutrophils Relative %: 63 %
Platelets: 245 10*3/uL (ref 150–400)
RBC: 5.45 MIL/uL (ref 4.22–5.81)
RDW: 20.4 % — ABNORMAL HIGH (ref 11.5–15.5)
WBC: 6.4 10*3/uL (ref 4.0–10.5)
nRBC: 0 % (ref 0.0–0.2)

## 2022-12-09 LAB — CBG MONITORING, ED: Glucose-Capillary: 133 mg/dL — ABNORMAL HIGH (ref 70–99)

## 2022-12-09 LAB — D-DIMER, QUANTITATIVE: D-Dimer, Quant: 4.48 ug/mL-FEU — ABNORMAL HIGH (ref 0.00–0.50)

## 2022-12-09 LAB — TROPONIN I (HIGH SENSITIVITY): Troponin I (High Sensitivity): 4 ng/L (ref <18)

## 2022-12-09 MED ORDER — ACETAMINOPHEN 325 MG PO TABS
650.0000 mg | ORAL_TABLET | Freq: Four times a day (QID) | ORAL | Status: DC | PRN
  Administered 2022-12-09: 650 mg via ORAL

## 2022-12-09 MED ORDER — HYDROCODONE-ACETAMINOPHEN 5-325 MG PO TABS
1.0000 | ORAL_TABLET | Freq: Once | ORAL | Status: AC
  Administered 2022-12-09: 1 via ORAL
  Filled 2022-12-09: qty 1

## 2022-12-09 MED ORDER — ENOXAPARIN SODIUM 60 MG/0.6ML IJ SOSY
50.0000 mg | PREFILLED_SYRINGE | Freq: Every day | INTRAMUSCULAR | Status: DC
  Administered 2022-12-10 (×2): 50 mg via SUBCUTANEOUS
  Filled 2022-12-09 (×3): qty 0.6

## 2022-12-09 MED ORDER — GABAPENTIN 300 MG PO CAPS
300.0000 mg | ORAL_CAPSULE | Freq: Two times a day (BID) | ORAL | Status: DC
  Administered 2022-12-09 – 2022-12-11 (×4): 300 mg via ORAL
  Filled 2022-12-09 (×4): qty 1

## 2022-12-09 MED ORDER — OSELTAMIVIR PHOSPHATE 75 MG PO CAPS
75.0000 mg | ORAL_CAPSULE | Freq: Two times a day (BID) | ORAL | Status: DC
  Administered 2022-12-09 – 2022-12-11 (×4): 75 mg via ORAL
  Filled 2022-12-09 (×5): qty 1

## 2022-12-09 MED ORDER — INSULIN ASPART 100 UNIT/ML IJ SOLN: 0.0000 [IU] | Freq: Every day | INTRAMUSCULAR | Status: DC

## 2022-12-09 MED ORDER — ONDANSETRON HCL 4 MG PO TABS: 4.0000 mg | ORAL_TABLET | Freq: Four times a day (QID) | ORAL | Status: DC | PRN

## 2022-12-09 MED ORDER — INSULIN ASPART 100 UNIT/ML IJ SOLN
3.0000 [IU] | Freq: Three times a day (TID) | INTRAMUSCULAR | Status: DC
  Administered 2022-12-10 – 2022-12-11 (×4): 3 [IU] via SUBCUTANEOUS

## 2022-12-09 MED ORDER — POLYETHYLENE GLYCOL 3350 17 G PO PACK: 17.0000 g | PACK | Freq: Every day | ORAL | Status: DC | PRN

## 2022-12-09 MED ORDER — HYDROCODONE-ACETAMINOPHEN 5-325 MG PO TABS
1.0000 | ORAL_TABLET | ORAL | Status: DC | PRN
  Administered 2022-12-10 – 2022-12-11 (×8): 2 via ORAL
  Filled 2022-12-09 (×8): qty 2

## 2022-12-09 MED ORDER — INSULIN ASPART 100 UNIT/ML IJ SOLN
0.0000 [IU] | Freq: Three times a day (TID) | INTRAMUSCULAR | Status: DC
  Administered 2022-12-10: 3 [IU] via SUBCUTANEOUS
  Administered 2022-12-10 – 2022-12-11 (×3): 1 [IU] via SUBCUTANEOUS

## 2022-12-09 MED ORDER — SODIUM CHLORIDE 0.9% FLUSH: 3.0000 mL | Freq: Two times a day (BID) | INTRAVENOUS | Status: DC

## 2022-12-09 MED ORDER — ONDANSETRON HCL 4 MG/2ML IJ SOLN: 4.0000 mg | Freq: Four times a day (QID) | INTRAMUSCULAR | Status: DC | PRN

## 2022-12-09 MED ORDER — BISACODYL 5 MG PO TBEC: 5.0000 mg | DELAYED_RELEASE_TABLET | Freq: Every day | ORAL | Status: DC | PRN

## 2022-12-09 MED ORDER — AMLODIPINE BESYLATE 5 MG PO TABS
10.0000 mg | ORAL_TABLET | Freq: Every day | ORAL | Status: DC
  Filled 2022-12-09: qty 2

## 2022-12-09 MED ORDER — SODIUM CHLORIDE 0.9 % IV SOLN: INTRAVENOUS | Status: DC

## 2022-12-09 MED ORDER — LISINOPRIL 20 MG PO TABS
40.0000 mg | ORAL_TABLET | Freq: Every day | ORAL | Status: DC
  Filled 2022-12-09: qty 2

## 2022-12-09 MED ORDER — ROSUVASTATIN CALCIUM 5 MG PO TABS
5.0000 mg | ORAL_TABLET | Freq: Every evening | ORAL | Status: DC
  Administered 2022-12-09 – 2022-12-10 (×2): 5 mg via ORAL
  Filled 2022-12-09 (×2): qty 1

## 2022-12-09 MED ORDER — PANTOPRAZOLE SODIUM 40 MG PO TBEC
40.0000 mg | DELAYED_RELEASE_TABLET | Freq: Every day | ORAL | Status: DC
  Administered 2022-12-10 – 2022-12-11 (×2): 40 mg via ORAL
  Filled 2022-12-09 (×2): qty 1

## 2022-12-09 MED ORDER — INSULIN GLARGINE-YFGN 100 UNIT/ML ~~LOC~~ SOLN
25.0000 [IU] | Freq: Two times a day (BID) | SUBCUTANEOUS | Status: DC
  Filled 2022-12-09 (×4): qty 0.25

## 2022-12-09 MED ORDER — GUAIFENESIN 100 MG/5ML PO LIQD
5.0000 mL | ORAL | Status: DC | PRN
  Administered 2022-12-10 – 2022-12-11 (×2): 5 mL via ORAL
  Filled 2022-12-09 (×2): qty 10

## 2022-12-09 MED ORDER — IOHEXOL 350 MG/ML SOLN
75.0000 mL | Freq: Once | INTRAVENOUS | Status: AC | PRN
  Administered 2022-12-09: 75 mL via INTRAVENOUS

## 2022-12-09 MED ORDER — ZOLPIDEM TARTRATE 5 MG PO TABS
10.0000 mg | ORAL_TABLET | Freq: Every evening | ORAL | Status: DC | PRN
  Administered 2022-12-10: 10 mg via ORAL
  Filled 2022-12-09: qty 2

## 2022-12-09 MED ORDER — SODIUM CHLORIDE 0.9 % IV BOLUS
1000.0000 mL | Freq: Once | INTRAVENOUS | Status: AC
  Administered 2022-12-09: 1000 mL via INTRAVENOUS

## 2022-12-09 MED ORDER — ACETAMINOPHEN 650 MG RE SUPP: 650.0000 mg | Freq: Four times a day (QID) | RECTAL | Status: DC | PRN

## 2022-12-09 MED ORDER — HYDROMORPHONE HCL 1 MG/ML IJ SOLN
0.5000 mg | INTRAMUSCULAR | Status: DC | PRN
  Administered 2022-12-10: 0.5 mg via INTRAVENOUS
  Filled 2022-12-09: qty 0.5

## 2022-12-09 MED ORDER — CARVEDILOL 12.5 MG PO TABS
12.5000 mg | ORAL_TABLET | Freq: Two times a day (BID) | ORAL | Status: DC
  Administered 2022-12-10 – 2022-12-11 (×3): 12.5 mg via ORAL
  Filled 2022-12-09 (×4): qty 1

## 2022-12-09 NOTE — ED Provider Notes (Signed)
[facility] EMERGENCY DEPARTMENT Provider Note   CSN: [id number] Arrival date & time: 12/09/22  1428     History  Chief Complaint  Patient presents with   Fall   Loss of Consciousness    Phillip Tate is a 66 y.o. male.   Fall  Loss of Consciousness    66 year old male with medical history significant for hypertension, hyperlipidemia, CAD, degenerative disc disease, DM 2, recent ablation at [facility] who presents to the emergency department after an episode of syncope.  The patient states that he has had roughly 4 days of symptoms of influenza and tested positive for influenza yesterday.  He states that he felt that he was improving.  He was in the bathroom and was standing when he suddenly passed out without prodrome.  He denied any chest pain, palpitations, shortness of breath previous to this.  He sustained an abrasion to his left forehead.  He states his tetanus is up-to-date.  He had no generalized shaking or seizure-like activity.  He came to without a postictal state.  He was orthostatic positive on scene with EMS and administered 500 cc of normal saline and 324 mg of aspirin.  He arrived to the emergency department GCS 15, ABC intact.  Home Medications Prior to Admission medications   Medication Sig Start Date End Date Taking? Authorizing Provider  acetaminophen (TYLENOL) 500 MG tablet Take 500 mg by mouth every 6 (six) hours as needed for mild pain.   Yes [provider]  amLODipine (NORVASC) 10 MG tablet Take 10 mg by mouth daily.   Yes [provider]  carvedilol (COREG) 12.5 MG tablet Take 12.5 mg by mouth 2 (two) times daily with a meal.   Yes [provider]  empagliflozin (JARDIANCE) 25 MG TABS tablet Take 25 mg by mouth daily.   Yes [provider]  Ferrous Sulfate (IRON PO) Take 1 tablet by mouth daily.   Yes [provider]  gabapentin (NEURONTIN) 300 MG capsule Take 300 mg by mouth 2 (two) times  daily. 08/02/16  Yes [provider]  ibuprofen (ADVIL) 200 MG tablet Take 200 mg by mouth every 6 (six) hours as needed for mild pain.   Yes [provider]  insulin glargine (LANTUS) 100 UNIT/ML injection Inject 36 Units into the skin at bedtime.   Yes [provider]  lisinopril (PRINIVIL,ZESTRIL) 40 MG tablet Take 40 mg by mouth daily.   Yes [provider]  metFORMIN (GLUCOPHAGE) 850 MG tablet Take 850 mg by mouth 2 (two) times daily with a meal.   Yes [provider]  NOVOLOG FLEXPEN 100 UNIT/ML FlexPen Inject 13-45 Units into the skin See admin instructions. Take 1 to 18 units at breakfast, 25 to 30 units at lunch, and 40 to 45 units at evening 07/18/16  Yes [provider]  pantoprazole (PROTONIX) 40 MG tablet TAKE 1 TABLET TWICE DAILY Patient taking differently: Take 40 mg by mouth daily. 05/30/22  Yes Thornton Park, MD  Polyethyl Glycol-Propyl Glycol (SYSTANE FREE OP) Place 1 drop into both eyes in the morning and at bedtime.   Yes [provider]  rosuvastatin (CRESTOR) 10 MG tablet Take 1 tablet (10 mg total) by mouth daily. Patient taking differently: Take 5 mg by mouth every evening. 09/18/16  Yes Jettie Booze, MD  zolpidem (AMBIEN) 10 MG tablet Take 10 mg by mouth at bedtime as needed for sleep. 10/01/15  Yes [provider]  lactulose (CEPHULAC) 10 g  packet Take 10 g by mouth every other day. Patient not taking: Reported on 12/09/2022    [provider]  mesalamine (LIALDA) 1.2 g EC tablet Take 2 tablets (2.4 g total) by mouth daily with breakfast. Patient not taking: Reported on 05/07/2022 09/24/21   Thornton Park, MD      Allergies    Cardizem cd [diltiazem hcl er beads], Toprol xl [metoprolol], and Tricor [fenofibrate]    Review of Systems   Review of Systems  Cardiovascular:  Positive for syncope.  Neurological:  Positive for syncope.  All other systems reviewed and are  negative.   Physical Exam Updated Vital Signs BP (!) 141/73   Pulse 75   Temp 98.2 F (36.8 C) (Oral)   Resp (!) 23   Ht '5\' 10"'$  (1.778 m)   Wt 101 kg   SpO2 96%   BMI 31.95 kg/m  Physical Exam Vitals and nursing note reviewed.  Constitutional:      Appearance: He is well-developed.     Comments: GCS 15, ABC intact  HENT:     Head: Normocephalic.     Comments: Abrasion to the left forehead    Mouth/Throat:     Mouth: Mucous membranes are dry.  Eyes:     Conjunctiva/sclera: Conjunctivae normal.  Neck:     Comments: No midline tenderness to palpation of the cervical spine. ROM intact. Cardiovascular:     Rate and Rhythm: Normal rate and regular rhythm.     Heart sounds: No murmur heard. Pulmonary:     Effort: Pulmonary effort is normal. No respiratory distress.     Breath sounds: Normal breath sounds.  Chest:     Comments: Chest wall stable and non-tender to AP and lateral compression. Clavicles stable and non-tender to AP compression Abdominal:     Palpations: Abdomen is soft.     Tenderness: There is no abdominal tenderness.     Comments: Pelvis stable to lateral compression.  Musculoskeletal:     Cervical back: Neck supple.     Comments: No midline tenderness to palpation of the thoracic or lumbar spine. Extremities atraumatic with intact ROM.   Skin:    General: Skin is warm and dry.  Neurological:     Mental Status: He is alert.     Comments: CN II-XII grossly intact. Moving all four extremities spontaneously and sensation grossly intact.     ED Results / Procedures / Treatments   Labs (all labs ordered are listed, but only abnormal results are displayed) Labs Reviewed  CBC WITH DIFFERENTIAL/PLATELET - Abnormal; Notable for the following components:      Result Value   Hemoglobin 11.3 (*)    HCT 37.9 (*)    MCV 69.5 (*)    MCH 20.7 (*)    MCHC 29.8 (*)    RDW 20.4 (*)    All other components within normal limits  COMPREHENSIVE METABOLIC PANEL -  Abnormal; Notable for the following components:   Sodium 134 (*)    Glucose, Bld 115 (*)    Calcium 8.6 (*)    Albumin 3.2 (*)    All other components within normal limits  D-DIMER, QUANTITATIVE - Abnormal; Notable for the following components:   D-Dimer, Quant 4.48 (*)    All other components within normal limits  CBG MONITORING, ED - Abnormal; Notable for the following components:   Glucose-Capillary 133 (*)    All other components within normal limits  HEMOGLOBIN Y1V  BASIC METABOLIC PANEL  CBC  TROPONIN I (HIGH SENSITIVITY)    EKG EKG Interpretation  Date/Time:  Tuesday December 09 2022 14:45:40 EST Ventricular Rate:  69 PR Interval:  150 QRS Duration: 100 QT Interval:  416 QTC Calculation: 445 R Axis:   68 Text Interpretation: Sinus rhythm with occasional Premature ventricular complexes Cannot rule out Inferior infarct , age undetermined Abnormal ECG When compared with ECG of 07-May-2022 13:12, PREVIOUS ECG IS PRESENT Confirmed by Regan Lemming ([id number]) on 12/09/2022 4:02:52 PM  Radiology CT Angio Chest PE W and/or Wo Contrast  Result Date: 12/09/2022 CLINICAL DATA:  Recent syncopal episode with fall, initial encounter EXAM: CT ANGIOGRAPHY CHEST WITH CONTRAST TECHNIQUE: Multidetector CT imaging of the chest was performed using the standard protocol during bolus administration of intravenous contrast. Multiplanar CT image reconstructions and MIPs were obtained to evaluate the vascular anatomy. RADIATION DOSE REDUCTION: This exam was performed according to the departmental dose-optimization program which includes automated exposure control, adjustment of the mA and/or kV according to patient size and/or use of iterative reconstruction technique. CONTRAST:  24m OMNIPAQUE IOHEXOL 350 MG/ML SOLN COMPARISON:  Chest x-ray from earlier in the same day. FINDINGS: Cardiovascular: Mild atherosclerotic calcifications of the thoracic aorta are noted. No aneurysmal dilatation is seen.  Coronary calcifications are noted. Heart is at the upper limits of normal in size. Pulmonary artery is well visualized bilaterally shows no evidence of pulmonary emboli. Mediastinum/Nodes: Thoracic inlet is within normal limits. No hilar or mediastinal adenopathy is noted. The esophagus is within normal limits. Lungs/Pleura: Lungs are well aerated bilaterally. No focal infiltrate or effusion is seen. Upper Abdomen: No acute abnormality. Musculoskeletal: Degenerative changes of the thoracic spine are noted. Left fifth rib fracture is noted posterolaterally. No pneumothorax is noted. Review of the MIP images confirms the above findings. IMPRESSION: No evidence of pulmonary emboli. Left fifth rib fracture without complicating factors. Aortic Atherosclerosis (ICD10-I70.0). Electronically Signed   By: MInez CatalinaM.D.   On: 12/09/2022 19:23   CT HEAD WO CONTRAST (5MM)  Result Date: 12/09/2022 CLINICAL DATA:  Neck Trauma EXAM: CT HEAD WITHOUT CONTRAST CT CERVICAL SPINE WITHOUT CONTRAST TECHNIQUE: Multidetector CT imaging of the head and cervical spine was performed following the standard protocol without intravenous contrast. Multiplanar CT image reconstructions of the cervical spine were also generated. RADIATION DOSE REDUCTION: This exam was performed according to the departmental dose-optimization program which includes automated exposure control, adjustment of the mA and/or kV according to patient size and/or use of iterative reconstruction technique. COMPARISON:  MRI Brain 08/12/16 FINDINGS: CT HEAD FINDINGS Brain: No evidence of acute infarction, hemorrhage, hydrocephalus, extra-axial collection or mass lesion/mass effect. Vascular: No hyperdense vessel or unexpected calcification. Skull: Normal. Negative for fracture or focal lesion. Sinuses/Orbits: No acute finding. Other: None. CT CERVICAL SPINE FINDINGS Alignment: Normal. Skull base and vertebrae: No acute fracture. No primary bone lesion or focal  pathologic process. Soft tissues and spinal canal: No prevertebral fluid or swelling. No visible canal hematoma. Disc levels:  No evidence of high-grade spinal canal stenosis. Upper chest: Negative. Other: None IMPRESSION: CT HEAD: No acute intracranial abnormality. CT CERVICAL SPINE: No acute fracture or traumatic subluxation. Electronically Signed   By: HMarin RobertsM.D.   On: 12/09/2022 19:22   CT Cervical Spine Wo Contrast  Result Date: 12/09/2022 CLINICAL DATA:  Neck Trauma EXAM: CT HEAD WITHOUT CONTRAST CT CERVICAL SPINE WITHOUT CONTRAST TECHNIQUE: Multidetector CT imaging of the head and cervical spine was performed following the standard protocol without intravenous contrast. Multiplanar CT image reconstructions of  the cervical spine were also generated. RADIATION DOSE REDUCTION: This exam was performed according to the departmental dose-optimization program which includes automated exposure control, adjustment of the mA and/or kV according to patient size and/or use of iterative reconstruction technique. COMPARISON:  MRI Brain 08/12/16 FINDINGS: CT HEAD FINDINGS Brain: No evidence of acute infarction, hemorrhage, hydrocephalus, extra-axial collection or mass lesion/mass effect. Vascular: No hyperdense vessel or unexpected calcification. Skull: Normal. Negative for fracture or focal lesion. Sinuses/Orbits: No acute finding. Other: None. CT CERVICAL SPINE FINDINGS Alignment: Normal. Skull base and vertebrae: No acute fracture. No primary bone lesion or focal pathologic process. Soft tissues and spinal canal: No prevertebral fluid or swelling. No visible canal hematoma. Disc levels:  No evidence of high-grade spinal canal stenosis. Upper chest: Negative. Other: None IMPRESSION: CT HEAD: No acute intracranial abnormality. CT CERVICAL SPINE: No acute fracture or traumatic subluxation. Electronically Signed   By: Marin Roberts M.D.   On: 12/09/2022 19:22   DG Chest Port 1 View  Result Date:  12/09/2022 CLINICAL DATA:  Provided history: Syncope. Additional history provided: Bilateral chest pain radiating to back, dizziness. EXAM: PORTABLE CHEST 1 VIEW COMPARISON:  Prior chest radiographs 05/07/2022 and earlier. FINDINGS: Heart size within normal limits. Aortic atherosclerosis. No appreciable airspace consolidation or pulmonary edema. No evidence of pleural effusion or pneumothorax. No acute bony abnormality identified. Dextrocurvature of the thoracic spine. IMPRESSION: 1. No evidence of acute cardiopulmonary abnormality. 2. Aortic Atherosclerosis (ICD10-I70.0). 3. Dextrocurvature of the thoracic spine. Electronically Signed   By: Kellie Simmering D.O.   On: 12/09/2022 16:14    Procedures Procedures    Medications Ordered in ED Medications  amLODipine (NORVASC) tablet 10 mg (has no administration in time range)  carvedilol (COREG) tablet 12.5 mg (has no administration in time range)  lisinopril (ZESTRIL) tablet 40 mg (has no administration in time range)  rosuvastatin (CRESTOR) tablet 5 mg (has no administration in time range)  zolpidem (AMBIEN) tablet 10 mg (has no administration in time range)  pantoprazole (PROTONIX) EC tablet 40 mg (has no administration in time range)  gabapentin (NEURONTIN) capsule 300 mg (has no administration in time range)  sodium chloride flush (NS) 0.9 % injection 3 mL (has no administration in time range)  insulin glargine-yfgn (SEMGLEE) injection 25 Units (has no administration in time range)  insulin aspart (novoLOG) injection 0-9 Units (has no administration in time range)  insulin aspart (novoLOG) injection 0-5 Units (has no administration in time range)  insulin aspart (novoLOG) injection 3 Units (has no administration in time range)  enoxaparin (LOVENOX) injection 40 mg (has no administration in time range)  acetaminophen (TYLENOL) tablet 650 mg (has no administration in time range)    Or  acetaminophen (TYLENOL) suppository 650 mg (has no  administration in time range)  HYDROcodone-acetaminophen (NORCO/VICODIN) 5-325 MG per tablet 1-2 tablet (has no administration in time range)  HYDROmorphone (DILAUDID) injection 0.5 mg (has no administration in time range)  polyethylene glycol (MIRALAX / GLYCOLAX) packet 17 g (has no administration in time range)  bisacodyl (DULCOLAX) EC tablet 5 mg (has no administration in time range)  ondansetron (ZOFRAN) tablet 4 mg (has no administration in time range)    Or  ondansetron (ZOFRAN) injection 4 mg (has no administration in time range)  sodium chloride 0.9 % bolus 1,000 mL (0 mLs Intravenous Stopped 12/09/22 1856)  iohexol (OMNIPAQUE) 350 MG/ML injection 75 mL (75 mLs Intravenous Contrast Given 12/09/22 1852)  HYDROcodone-acetaminophen (NORCO/VICODIN) 5-325 MG per tablet 1 tablet (1 tablet Oral  Given 12/09/22 2109)    ED Course/ Medical Decision Making/ A&P Clinical Course as of 12/09/22 2207  Tue Dec 09, 2022  1633 D-Dimer, Quant(!): 4.48 [JL]    Clinical Course User Index [JL] Regan Lemming, MD                           Medical Decision Making Amount and/or Complexity of Data Reviewed Labs: ordered. Decision-making details documented in ED Course. Radiology: ordered.  Risk Prescription drug management. Decision regarding hospitalization.    66 year old male with medical history significant for hypertension, hyperlipidemia, CAD, degenerative disc disease, DM 2, recent ablation at [facility] who presents to the emergency department after an episode of syncope.  The patient states that he has had roughly 4 days of symptoms of influenza and tested positive for influenza yesterday.  He states that he felt that he was improving.  He was in the bathroom and was standing when he suddenly passed out without prodrome.  He denied any chest pain, palpitations, shortness of breath previous to this.  He sustained an abrasion to his left forehead.  He states his tetanus is up-to-date.  He had no  generalized shaking or seizure-like activity.  He came to without a postictal state.  He was orthostatic positive on scene with EMS and administered 500 cc of normal saline and 324 mg of aspirin.  He arrived to the emergency department GCS 15, ABC intact.  On arrival, the patient was afebrile, not tachycardic or tachypneic, saturating 100% on room air.  Sinus rhythm noted on cardiac telemetry.  Physical exam significant for a left forehead abrasion, neurologically intact, otherwise generally unremarkable.  EKG revealed sinus rhythm with single PVC present, no acute ischemic changes noted, ventricular rate 69, no significantly abnormal intervals.  Chest x-ray was performed which revealed no evidence of acute cardiopulmonary abnormality, aortic atherosclerosis.  CT head and cervical spine was performed given the patient's syncope and head trauma and age with resulted in no evidence of traumatic injury to the head or cervical spine.  Differential diagnosis includes sudden cardiac arrhythmia, PE, hypovolemia from dehydration in the setting of influenza.  Additionally considered orthostatic hypotension and subsequent syncope.  On exam, he has intact distal pulses in all extremities, normal capillary refill, a regular heart rate, and normal breath sounds. There is no carotid bruit, unusual abdominal mass, or heart murmur.  As per above, there are no focal neurologic deficits on exam.  As per above, the ECG reveals no evidence of acute ischemia, dysrhythmia, or syncope-associated finding. The CXR was both interpreted by radiology and by myself. There is no evidence of cardiomegaly, pneumonia, pneumothorax, mediastinal widening, acute volume overload, fracture, or other acute, emergent intrathoracic pathology.    Laboratory evaluation concerning for elevated D-dimer to 4.48, troponin normal, CBG normal, CBC without leukocytosis, stable anemia to 11.3, CMP without significant electrolyte abnormality.  CTA PE  study was performed revealed the following: IMPRESSION:  No evidence of pulmonary emboli.    Left fifth rib fracture without complicating factors.    Aortic Atherosclerosis (ICD10-I70.0).    The patient was administered Norco for pain control in the setting of his acute rib fracture.  Plan will be for hospitalist admission for rehydration in the setting of dehydration and syncope and observation on cardiac telemetry given the patient's syncope with no prodrome.   I believe that the patient would benefit from ongoing observation and a TTE to evaluate for cardiac causes of syncope.  Therefore, hospitalist medicine was consulted for admission.  Dr. Myna Hidalgo of hospitalist medicine was consulted and accepted the patient in admission.   Final Clinical Impression(s) / ED Diagnoses Final diagnoses:  Syncope and collapse  Closed fracture of one rib of left side, initial encounter    Rx / DC Orders ED Discharge Orders     None         Regan Lemming, MD 12/09/22 2207

## 2022-12-09 NOTE — H&P (Signed)
History and Physical    Phillip Tate:[id number] DOB: 08-Jul-1956 DOA: 12/09/2022  PCP: Wenda Low, MD   Patient coming from: Home   Chief Complaint: Syncope, fall   HPI: Phillip Tate is a pleasant 66 y.o. male with medical history significant for hypertension, type 2 diabetes mellitus, iron deficiency anemia, rectal cancer status postresection, PVC ablation in September 2023, revision of colostomy and repair of parastomal hernia in October 2023, and recent influenza diagnosis who presents emergency department after syncopal episode.  Patient reports being diagnosed with influenza yesterday after 4 days of symptoms.  He felt as though he was improving with regard to the viral symptoms but then had a syncopal episode today while standing and immediately after urinating.  He was feeling lightheaded while urinating but did not experience any chest pain or palpitations.  Has been experiencing severe left lateral chest wall pain since the fall, worse with movement, deep breath, or cough.  He had positive orthostatics with EMS and was given 500 cc of normal saline and 324 mg of aspirin prior to arrival in the ED.   ED Course: Upon arrival to the ED, patient is found to be afebrile and saturating well on room air with normal heart rate and stable blood pressure.  EKG demonstrates sinus rhythm with PVC.  Chest x-ray negative for acute cardiopulmonary disease.  CTA chest negative for PE but notable for fracture of the left fifth rib.  No acute findings noted on head CT or CT cervical spine.  Blood work notable for D-dimer 4.48 and hemoglobin 11.3 with MCV 69.5.  Patient was given a liter of normal saline and Norco in the ED.  Review of Systems:  All other systems reviewed and apart from HPI, are negative.  Past Medical History:  Diagnosis Date   Coronary artery disease    DDD (degenerative disc disease), lumbar    Diabetes mellitus without complication ([place])     Dyslipidemia    History of rectal cancer    dx 2006 --  Stage III, T3  N1  M0,  s/p  chemoradiation (07-10-2005 to 09-05-2005) and anterior colon resection 11-04-2006   History of shingles    Hyperlipidemia    Hypertension    Iron deficiency anemia    intolerent PO iron--  gets IV iron,  last one 03-12-2015 ([facility])   Organic impotence    Rectal cancer ([facility])    adenocarcinoma stage lll    Past Surgical History:  Procedure Laterality Date   ANAL RECTAL MANOMETRY N/A 09/04/2021   Procedure: ANO RECTAL MANOMETRY;  Surgeon: Mauri Pole, MD;  Location: [facility];  Service: Endoscopy;  Laterality: N/A;   ANTERIOR COLON RESECTION W/ COLOSTOMY  11-04-2006   CATARACT EXTRACTION W/ INTRAOCULAR LENS IMPLANT Right sept 2015  &  feb 2016   COLONOSCOPY WITH ESOPHAGOGASTRODUODENOSCOPY (EGD)  last one 03-07-2010   COLONOSCOPY WITH PROPOFOL N/A 10/23/2015   Procedure: COLONOSCOPY WITH PROPOFOL;  Surgeon: Garlan Fair, MD;  Location: [facility];  Service: Endoscopy;  Laterality: N/A;   COLOSTOMY TAKEDOWN  june 2007   EXAMINATION UNDER ANESTHESIA N/A 04/23/2015   Procedure: EXAM UNDER ANESTHESIA, MANIPULATION OF PENILE PROSTHESIS PUMP;  Surgeon: Kathie Rhodes, MD;  Location: [facility];  Service: Urology;  Laterality: N/A;   PENILE PROSTHESIS PLACEMENT  10-29-2009   PORT A CATH PLACEMENT  07-23-2005   prolapsed hemorrhoid      Social History:   reports that he has never smoked. He has never  used smokeless tobacco. He reports current alcohol use. He reports that he does not use drugs.  Allergies  Allergen Reactions   Cardizem Cd [Diltiazem Hcl Er Beads] Swelling   Toprol Xl [Metoprolol] Other (See Comments)    headache   Tricor [Fenofibrate] Other (See Comments)    Constipation     Family History  Problem Relation Age of Onset   Hypertension Father    Diabetes Mother    Heart attack Neg Hx    Colon cancer Neg Hx    Esophageal cancer Neg Hx    Rectal cancer  Neg Hx    Stomach cancer Neg Hx      Prior to Admission medications   Medication Sig Start Date End Date Taking? Authorizing Provider  acetaminophen (TYLENOL) 500 MG tablet Take 500 mg by mouth every 6 (six) hours as needed for mild pain.   Yes [provider]  amLODipine (NORVASC) 10 MG tablet Take 10 mg by mouth daily.   Yes [provider]  carvedilol (COREG) 12.5 MG tablet Take 12.5 mg by mouth 2 (two) times daily with a meal.   Yes [provider]  empagliflozin (JARDIANCE) 25 MG TABS tablet Take 25 mg by mouth daily.   Yes [provider]  Ferrous Sulfate (IRON PO) Take 1 tablet by mouth daily.   Yes [provider]  gabapentin (NEURONTIN) 300 MG capsule Take 300 mg by mouth 2 (two) times daily. 08/02/16  Yes [provider]  ibuprofen (ADVIL) 200 MG tablet Take 200 mg by mouth every 6 (six) hours as needed for mild pain.   Yes [provider]  insulin glargine (LANTUS) 100 UNIT/ML injection Inject 36 Units into the skin at bedtime.   Yes [provider]  lisinopril (PRINIVIL,ZESTRIL) 40 MG tablet Take 40 mg by mouth daily.   Yes [provider]  metFORMIN (GLUCOPHAGE) 850 MG tablet Take 850 mg by mouth 2 (two) times daily with a meal.   Yes [provider]  NOVOLOG FLEXPEN 100 UNIT/ML FlexPen Inject 13-45 Units into the skin See admin instructions. Take 1 to 18 units at breakfast, 25 to 30 units at lunch, and 40 to 45 units at evening 07/18/16  Yes [provider]  pantoprazole (PROTONIX) 40 MG tablet TAKE 1 TABLET TWICE DAILY Patient taking differently: Take 40 mg by mouth daily. 05/30/22  Yes Thornton Park, MD  Polyethyl Glycol-Propyl Glycol (SYSTANE FREE OP) Place 1 drop into both eyes in the morning and at bedtime.   Yes [provider]  rosuvastatin (CRESTOR) 10 MG tablet Take 1 tablet (10 mg total) by mouth daily. Patient taking differently: Take 5 mg by mouth every evening.  09/18/16  Yes Jettie Booze, MD  zolpidem (AMBIEN) 10 MG tablet Take 10 mg by mouth at bedtime as needed for sleep. 10/01/15  Yes [provider]  lactulose (CEPHULAC) 10 g packet Take 10 g by mouth every other day. Patient not taking: Reported on 12/09/2022    [provider]  mesalamine (LIALDA) 1.2 g EC tablet Take 2 tablets (2.4 g total) by mouth daily with breakfast. Patient not taking: Reported on 05/07/2022 09/24/21   Thornton Park, MD    Physical Exam: Vitals:   12/09/22 1600 12/09/22 1645 12/09/22 1730 12/09/22 1815  BP: 98/69 108/67 121/67 (!) 141/73  Pulse: 68 71 73 75  Resp: 20 (!) 21 16 (!) 23  Temp:      TempSrc:      SpO2: 100%  94% 98% 96%  Weight:      Height:        Constitutional: NAD, calm  Eyes: PERTLA, lids and conjunctivae normal ENMT: Mucous membranes are moist. Posterior pharynx clear of any exudate or lesions.   Neck: supple, no masses  Respiratory: no wheezing, no crackles. No accessory muscle use.  Cardiovascular: S1 & S2 heard, regular rate and rhythm. Trace LE edema.   Abdomen: No distension, no tenderness, soft. Bowel sounds active.  Musculoskeletal: no clubbing / cyanosis. No joint deformity upper and lower extremities.   Skin: forehead abrasion. Warm, dry, well-perfused. Neurologic: CN 2-12 grossly intact. Moving all extremities. Alert and oriented.  Psychiatric: Pleasant. Cooperative.    Labs and Imaging on Admission: I have personally reviewed following labs and imaging studies  CBC: Recent Labs  Lab 12/09/22 1457  WBC 6.4  NEUTROABS 4.1  HGB 11.3*  HCT 37.9*  MCV 69.5*  PLT 884   Basic Metabolic Panel: Recent Labs  Lab 12/09/22 1457  NA 134*  K 4.3  CL 101  CO2 22  GLUCOSE 115*  BUN 13  CREATININE 0.97  CALCIUM 8.6*   GFR: Estimated Creatinine Clearance: 88 mL/min (by C-G formula based on SCr of 0.97 mg/dL). Liver Function Tests: Recent Labs  Lab 12/09/22 1457  AST 37  ALT 18  ALKPHOS 59   BILITOT 0.4  PROT 7.2  ALBUMIN 3.2*   No results for input(s): "LIPASE", "AMYLASE" in the last 168 hours. No results for input(s): "AMMONIA" in the last 168 hours. Coagulation Profile: No results for input(s): "INR", "PROTIME" in the last 168 hours. Cardiac Enzymes: No results for input(s): "CKTOTAL", "CKMB", "CKMBINDEX", "TROPONINI" in the last 168 hours. BNP (last 3 results) No results for input(s): "PROBNP" in the last 8760 hours. HbA1C: No results for input(s): "HGBA1C" in the last 72 hours. CBG: Recent Labs  Lab 12/09/22 1544  GLUCAP 133*   Lipid Profile: No results for input(s): "CHOL", "HDL", "LDLCALC", "TRIG", "CHOLHDL", "LDLDIRECT" in the last 72 hours. Thyroid Function Tests: No results for input(s): "TSH", "T4TOTAL", "FREET4", "T3FREE", "THYROIDAB" in the last 72 hours. Anemia Panel: No results for input(s): "VITAMINB12", "FOLATE", "FERRITIN", "TIBC", "IRON", "RETICCTPCT" in the last 72 hours. Urine analysis:    Component Value Date/Time   COLORURINE YELLOW 05/07/2022 1344   APPEARANCEUR CLEAR 05/07/2022 1344   LABSPEC 1.024 05/07/2022 1344   PHURINE 8.0 05/07/2022 1344   GLUCOSEU >=500 (A) 05/07/2022 1344   HGBUR NEGATIVE 05/07/2022 1344   BILIRUBINUR NEGATIVE 05/07/2022 1344   KETONESUR 5 (A) 05/07/2022 1344   PROTEINUR 100 (A) 05/07/2022 1344   UROBILINOGEN 0.2 10/18/2007 1926   NITRITE NEGATIVE 05/07/2022 1344   LEUKOCYTESUR NEGATIVE 05/07/2022 1344   Sepsis Labs: '@LABRCNTIP'$ (procalcitonin:4,lacticidven:4) )No results found for this or any previous visit (from the past 240 hour(s)).   Radiological Exams on Admission: CT Angio Chest PE W and/or Wo Contrast  Result Date: 12/09/2022 CLINICAL DATA:  Recent syncopal episode with fall, initial encounter EXAM: CT ANGIOGRAPHY CHEST WITH CONTRAST TECHNIQUE: Multidetector CT imaging of the chest was performed using the standard protocol during bolus administration of intravenous contrast. Multiplanar CT image  reconstructions and MIPs were obtained to evaluate the vascular anatomy. RADIATION DOSE REDUCTION: This exam was performed according to the departmental dose-optimization program which includes automated exposure control, adjustment of the mA and/or kV according to patient size and/or use of iterative reconstruction technique. CONTRAST:  36m OMNIPAQUE IOHEXOL 350 MG/ML SOLN COMPARISON:  Chest x-ray from earlier in the same day.  FINDINGS: Cardiovascular: Mild atherosclerotic calcifications of the thoracic aorta are noted. No aneurysmal dilatation is seen. Coronary calcifications are noted. Heart is at the upper limits of normal in size. Pulmonary artery is well visualized bilaterally shows no evidence of pulmonary emboli. Mediastinum/Nodes: Thoracic inlet is within normal limits. No hilar or mediastinal adenopathy is noted. The esophagus is within normal limits. Lungs/Pleura: Lungs are well aerated bilaterally. No focal infiltrate or effusion is seen. Upper Abdomen: No acute abnormality. Musculoskeletal: Degenerative changes of the thoracic spine are noted. Left fifth rib fracture is noted posterolaterally. No pneumothorax is noted. Review of the MIP images confirms the above findings. IMPRESSION: No evidence of pulmonary emboli. Left fifth rib fracture without complicating factors. Aortic Atherosclerosis (ICD10-I70.0). Electronically Signed   By: Inez Catalina M.D.   On: 12/09/2022 19:23   CT HEAD WO CONTRAST (5MM)  Result Date: 12/09/2022 CLINICAL DATA:  Neck Trauma EXAM: CT HEAD WITHOUT CONTRAST CT CERVICAL SPINE WITHOUT CONTRAST TECHNIQUE: Multidetector CT imaging of the head and cervical spine was performed following the standard protocol without intravenous contrast. Multiplanar CT image reconstructions of the cervical spine were also generated. RADIATION DOSE REDUCTION: This exam was performed according to the departmental dose-optimization program which includes automated exposure control, adjustment of  the mA and/or kV according to patient size and/or use of iterative reconstruction technique. COMPARISON:  MRI Brain 08/12/16 FINDINGS: CT HEAD FINDINGS Brain: No evidence of acute infarction, hemorrhage, hydrocephalus, extra-axial collection or mass lesion/mass effect. Vascular: No hyperdense vessel or unexpected calcification. Skull: Normal. Negative for fracture or focal lesion. Sinuses/Orbits: No acute finding. Other: None. CT CERVICAL SPINE FINDINGS Alignment: Normal. Skull base and vertebrae: No acute fracture. No primary bone lesion or focal pathologic process. Soft tissues and spinal canal: No prevertebral fluid or swelling. No visible canal hematoma. Disc levels:  No evidence of high-grade spinal canal stenosis. Upper chest: Negative. Other: None IMPRESSION: CT HEAD: No acute intracranial abnormality. CT CERVICAL SPINE: No acute fracture or traumatic subluxation. Electronically Signed   By: Marin Roberts M.D.   On: 12/09/2022 19:22   CT Cervical Spine Wo Contrast  Result Date: 12/09/2022 CLINICAL DATA:  Neck Trauma EXAM: CT HEAD WITHOUT CONTRAST CT CERVICAL SPINE WITHOUT CONTRAST TECHNIQUE: Multidetector CT imaging of the head and cervical spine was performed following the standard protocol without intravenous contrast. Multiplanar CT image reconstructions of the cervical spine were also generated. RADIATION DOSE REDUCTION: This exam was performed according to the departmental dose-optimization program which includes automated exposure control, adjustment of the mA and/or kV according to patient size and/or use of iterative reconstruction technique. COMPARISON:  MRI Brain 08/12/16 FINDINGS: CT HEAD FINDINGS Brain: No evidence of acute infarction, hemorrhage, hydrocephalus, extra-axial collection or mass lesion/mass effect. Vascular: No hyperdense vessel or unexpected calcification. Skull: Normal. Negative for fracture or focal lesion. Sinuses/Orbits: No acute finding. Other: None. CT CERVICAL SPINE  FINDINGS Alignment: Normal. Skull base and vertebrae: No acute fracture. No primary bone lesion or focal pathologic process. Soft tissues and spinal canal: No prevertebral fluid or swelling. No visible canal hematoma. Disc levels:  No evidence of high-grade spinal canal stenosis. Upper chest: Negative. Other: None IMPRESSION: CT HEAD: No acute intracranial abnormality. CT CERVICAL SPINE: No acute fracture or traumatic subluxation. Electronically Signed   By: Marin Roberts M.D.   On: 12/09/2022 19:22   DG Chest Port 1 View  Result Date: 12/09/2022 CLINICAL DATA:  Provided history: Syncope. Additional history provided: Bilateral chest pain radiating to back, dizziness. EXAM: PORTABLE CHEST 1  VIEW COMPARISON:  Prior chest radiographs 05/07/2022 and earlier. FINDINGS: Heart size within normal limits. Aortic atherosclerosis. No appreciable airspace consolidation or pulmonary edema. No evidence of pleural effusion or pneumothorax. No acute bony abnormality identified. Dextrocurvature of the thoracic spine. IMPRESSION: 1. No evidence of acute cardiopulmonary abnormality. 2. Aortic Atherosclerosis (ICD10-I70.0). 3. Dextrocurvature of the thoracic spine. Electronically Signed   By: Kellie Simmering D.O.   On: 12/09/2022 16:14    EKG: Independently reviewed. Sinus rhythm, PVC.   Assessment/Plan   1. Syncope  - Most likely reflex/micturation syncope though he has risk factors for cardiogenic syncope  - Continue cardiac monitoring, check echocardiogram   2. Rib fracture  - Pt suffered isolated 5th rib fracture on left without complication  - Continue pain-control and incentive spirometry   3. Insulin-dependent DM  - A1c was 6.6% in May 2023  - Continue CBG checks and insulin   4. Hypertension  - Continue Norvasc, Coreg, and lisinopril as tolerated    5. Influenza  - Started Tamiflu 12/08/22   - Continue Tamiflu, droplet precautions     DVT prophylaxis: Lovenox  Code Status: Full  Level of Care:  Level of care: Telemetry Cardiac Family Communication: none present Disposition Plan:  Patient is from: Home  Anticipated d/c is to: Home  Anticipated d/c date is: 12/13 or 12/14/=/23  Patient currently: pending cardiac monitoring and echocardiogram  Consults called: none  Admission status: Observation     Vianne Bulls, MD [facility]  12/09/2022, 10:04 PM

## 2022-12-09 NOTE — ED Triage Notes (Signed)
Pt BIB [facility] for eval of syncope w/ fall. Hx of ablation at [place] 2 mos ago. [place] today, struck head, +LOC. Recent dx of flu, started tamiflu 2 days ago. +Urinary incontinence. EMS reports orthostatic changes on scene. 12 lead unremarkable, sinus rhythm w/ PVCs. 500cc NS via EMS. 324 ASA.

## 2022-12-09 NOTE — ED Notes (Signed)
Unable to stand for orthostatic vs. Pt reported severe mid chest pain when changing position from laying to sitting. Denies dizziness. Will notify MD.

## 2022-12-10 ENCOUNTER — Observation Stay (HOSPITAL_BASED_OUTPATIENT_CLINIC_OR_DEPARTMENT_OTHER): Payer: Medicare HMO

## 2022-12-10 DIAGNOSIS — I951 Orthostatic hypotension: Secondary | ICD-10-CM

## 2022-12-10 DIAGNOSIS — R55 Syncope and collapse: Secondary | ICD-10-CM | POA: Diagnosis not present

## 2022-12-10 DIAGNOSIS — Z933 Colostomy status: Secondary | ICD-10-CM | POA: Diagnosis not present

## 2022-12-10 DIAGNOSIS — S2232XA Fracture of one rib, left side, initial encounter for closed fracture: Secondary | ICD-10-CM | POA: Diagnosis not present

## 2022-12-10 LAB — ECHOCARDIOGRAM COMPLETE
Area-P 1/2: 3.6 cm2
Calc EF: 76 %
Height: 70 in
MV M vel: 1.1 m/s
MV Peak grad: 4.8 mmHg
S' Lateral: 3.4 cm
Single Plane A2C EF: 76.8 %
Single Plane A4C EF: 74.2 %
Weight: 3562.63 oz

## 2022-12-10 LAB — GLUCOSE, CAPILLARY
Glucose-Capillary: 139 mg/dL — ABNORMAL HIGH (ref 70–99)
Glucose-Capillary: 154 mg/dL — ABNORMAL HIGH (ref 70–99)

## 2022-12-10 LAB — CBC
HCT: 37.3 % — ABNORMAL LOW (ref 39.0–52.0)
Hemoglobin: 11.2 g/dL — ABNORMAL LOW (ref 13.0–17.0)
MCH: 20.7 pg — ABNORMAL LOW (ref 26.0–34.0)
MCHC: 30 g/dL (ref 30.0–36.0)
MCV: 68.9 fL — ABNORMAL LOW (ref 80.0–100.0)
Platelets: 265 10*3/uL (ref 150–400)
RBC: 5.41 MIL/uL (ref 4.22–5.81)
RDW: 20.5 % — ABNORMAL HIGH (ref 11.5–15.5)
WBC: 5.4 10*3/uL (ref 4.0–10.5)
nRBC: 0 % (ref 0.0–0.2)

## 2022-12-10 LAB — BASIC METABOLIC PANEL
Anion gap: 8 (ref 5–15)
BUN: 11 mg/dL (ref 8–23)
CO2: 21 mmol/L — ABNORMAL LOW (ref 22–32)
Calcium: 8.5 mg/dL — ABNORMAL LOW (ref 8.9–10.3)
Chloride: 104 mmol/L (ref 98–111)
Creatinine, Ser: 0.72 mg/dL (ref 0.61–1.24)
GFR, Estimated: >60 mL/min (ref >60)
Glucose, Bld: 139 mg/dL — ABNORMAL HIGH (ref 70–99)
Potassium: 4.6 mmol/L (ref 3.5–5.1)
Sodium: 133 mmol/L — ABNORMAL LOW (ref 135–145)

## 2022-12-10 LAB — HEMOGLOBIN A1C
Hgb A1c MFr Bld: 7 % — ABNORMAL HIGH (ref 4.8–5.6)
Mean Plasma Glucose: 154 mg/dL

## 2022-12-10 LAB — CBG MONITORING, ED
Glucose-Capillary: 119 mg/dL — ABNORMAL HIGH (ref 70–99)
Glucose-Capillary: 173 mg/dL — ABNORMAL HIGH (ref 70–99)
Glucose-Capillary: 202 mg/dL — ABNORMAL HIGH (ref 70–99)

## 2022-12-10 MED ORDER — SODIUM CHLORIDE 0.9 % IV BOLUS
500.0000 mL | Freq: Once | INTRAVENOUS | Status: AC
  Administered 2022-12-10: 500 mL via INTRAVENOUS

## 2022-12-10 MED ORDER — INSULIN GLARGINE-YFGN 100 UNIT/ML ~~LOC~~ SOLN
15.0000 [IU] | Freq: Two times a day (BID) | SUBCUTANEOUS | Status: DC
  Administered 2022-12-10 – 2022-12-11 (×3): 15 [IU] via SUBCUTANEOUS
  Filled 2022-12-10 (×4): qty 0.15

## 2022-12-10 MED ORDER — PERFLUTREN LIPID MICROSPHERE
1.0000 mL | INTRAVENOUS | Status: AC | PRN
  Administered 2022-12-10: 2 mL via INTRAVENOUS

## 2022-12-10 MED ORDER — METHOCARBAMOL 500 MG PO TABS
500.0000 mg | ORAL_TABLET | Freq: Four times a day (QID) | ORAL | Status: DC | PRN
  Administered 2022-12-10 (×3): 500 mg via ORAL
  Filled 2022-12-10 (×3): qty 1

## 2022-12-10 MED ORDER — SODIUM CHLORIDE 0.9 % IV SOLN: INTRAVENOUS | Status: AC

## 2022-12-10 NOTE — Progress Notes (Signed)
  Echocardiogram 2D Echocardiogram has been performed.  Phillip Tate 12/10/2022, 12:05 PM

## 2022-12-10 NOTE — Plan of Care (Signed)

## 2022-12-10 NOTE — Evaluation (Signed)
Physical Therapy Evaluation Patient Details Name: Phillip Tate MRN: [id number] DOB: 1956/02/27 Today's Date: 12/10/2022  History of Present Illness  Pt is 66 yo male admitted on 12/09/22 after syncopal epsiode.  Pt with influenza and found to be orthostatic with EMS.  Pt also with L 5th rib posteriorlateral fx.  Pt with hx of HTN, DM2, anemia, rectal CA, PVC ablation, revision of colostomy.  Clinical Impression  Pt admitted with above diagnosis. At baseline, pt completely independent and working.  Today, he reports pain 10/10 with attempts to roll earlier with oral pain meds.  Pt received IV dilaudid prior to therapy and was able tolerate transfers and ambulation with 2/10 pain.  Also, educated on transfer technique and positions of comfort for pain control.  Pt motivated but limited by pain.  Pt did well today with therapy but required IV pain meds. Discussed would be painful but need to advance to mobility with oral pain meds (hopefully next session)  since he would not have IV meds at home.  Pt currently with functional limitations due to the deficits listed below (see PT Problem List). Pt will benefit from skilled PT to increase their independence and safety with mobility to allow discharge to the venue listed below.          Recommendations for follow up therapy are one component of a multi-disciplinary discharge planning process, led by the attending physician.  Recommendations may be updated based on patient status, additional functional criteria and insurance authorization.  Follow Up Recommendations No PT follow up      Assistance Recommended at Discharge PRN  Patient can return home with the following  A little help with walking and/or transfers;A little help with bathing/dressing/bathroom;Assistance with cooking/housework;Help with stairs or ramp for entrance    Equipment Recommendations None recommended by PT  Recommendations for Other Services       Functional Status  Assessment Patient has had a recent decline in their functional status and demonstrates the ability to make significant improvements in function in a reasonable and predictable amount of time.     Precautions / Restrictions Precautions Precautions: None      Mobility  Bed Mobility Overal bed mobility: Needs Assistance Bed Mobility: Rolling, Sidelying to Sit Rolling: Supervision Sidelying to sit: Supervision       General bed mobility comments: Use of rails; cued to roll to right and sit for decreased pain    Transfers Overall transfer level: Needs assistance Equipment used: None Transfers: Sit to/from Stand Sit to Stand: Supervision           General transfer comment: performed safely ; no syncope symptoms    Ambulation/Gait Ambulation/Gait assistance: Min guard Gait Distance (Feet): 120 Feet Assistive device: IV Pole Gait Pattern/deviations: Step-through pattern Gait velocity: decreased     General Gait Details: Steady gait , no LOB, able to maneuver IV pole  Stairs            Wheelchair Mobility    Modified Rankin (Stroke Patients Only)       Balance Overall balance assessment: Needs assistance Sitting-balance support: No upper extremity supported Sitting balance-Leahy Scale: Normal     Standing balance support: No upper extremity supported Standing balance-Leahy Scale: Good                               Pertinent Vitals/Pain Pain Assessment Pain Assessment: 0-10 Pain Score: 2  Pain Location: L rib Pain  Descriptors / Indicators: Sharp Pain Intervention(s): Limited activity within patient's tolerance, Monitored during session, Premedicated before session (Premedicated with dilaudid)    Home Living Family/patient expects to be discharged to:: Private residence Living Arrangements: Spouse/significant other Available Help at Discharge: Family;Available 24 hours/day Type of Home: House Home Access: Stairs to enter Entrance  Stairs-Rails: Psychiatric nurse of Steps: 4   Home Layout: Multi-level;Able to live on main level with bedroom/bathroom Home Equipment: None      Prior Function Prior Level of Function : Independent/Modified Independent;Working/employed;Driving                     Hand Dominance        Extremity/Trunk Assessment   Upper Extremity Assessment Upper Extremity Assessment: Overall WFL for tasks assessed    Lower Extremity Assessment Lower Extremity Assessment: Overall WFL for tasks assessed    Cervical / Trunk Assessment Cervical / Trunk Assessment: Normal  Communication   Communication: No difficulties  Cognition Arousal/Alertness: Awake/alert Behavior During Therapy: WFL for tasks assessed/performed Overall Cognitive Status: Within Functional Limits for tasks assessed                                          General Comments General comments (skin integrity, edema, etc.): Pt educated on importance of early mobility and deep breathing to decrease risk of PNE with rib fx and the flu. Educated on positions of comfort (sleeping in recliner at home) and transfer techniques (getting of R side of bed if in bed).  Pt reports pain was severe earlier 10/10 with rolling with oral pain medications.  RN gave him dilaudid prior to eval and he said it was "magic" pain only a 2/10.  Discussed would not be able to have IV dilaudid at home, so need to progress to mobility with oral pain meds.  BP stable 130s/70's supine and sit, no symptoms standing did not test    Exercises     Assessment/Plan    PT Assessment Patient needs continued PT services  PT Problem List Cardiopulmonary status limiting activity;Pain;Decreased range of motion;Decreased activity tolerance;Decreased mobility;Decreased knowledge of use of DME       PT Treatment Interventions DME instruction;Therapeutic exercise;Gait training;Balance training;Stair  training;Modalities;Functional mobility training;Patient/family education;Therapeutic activities    PT Goals (Current goals can be found in the Care Plan section)  Acute Rehab PT Goals Patient Stated Goal: decrease pain to 5/10 level with oral meds to allow movement PT Goal Formulation: With patient Time For Goal Achievement: 12/24/22 Potential to Achieve Goals: Good    Frequency Min 3X/week     Co-evaluation               AM-PAC PT "6 Clicks" Mobility  Outcome Measure Help needed turning from your back to your side while in a flat bed without using bedrails?: A Little Help needed moving from lying on your back to sitting on the side of a flat bed without using bedrails?: A Little Help needed moving to and from a bed to a chair (including a wheelchair)?: A Little Help needed standing up from a chair using your arms (e.g., wheelchair or bedside chair)?: A Little Help needed to walk in hospital room?: A Little Help needed climbing 3-5 steps with a railing? : A Little 6 Click Score: 18    End of Session   Activity Tolerance: Patient tolerated treatment well (with  IV dilaudid) Patient left: in bed;with call bell/phone within reach Nurse Communication: Mobility status PT Visit Diagnosis: Other abnormalities of gait and mobility (R26.89);Pain Pain - Right/Left: Left Pain - part of body:  (chest/rib)    Time: 3810-1751 PT Time Calculation (min) (ACUTE ONLY): 25 min   Charges:   PT Evaluation $PT Eval Low Complexity: 1 Low PT Treatments $Therapeutic Activity: 8-22 mins        Abran Richard, PT Acute Rehab [facility] [phone]   Phillip Tate 12/10/2022, 5:09 PM

## 2022-12-10 NOTE — Progress Notes (Signed)
Pt arrived from ..ED..., A/ox .4..pt denies any pain, MD aware,CCMD called. CHG bath given,no further needs at this time   

## 2022-12-10 NOTE — Progress Notes (Addendum)
[facility] PROGRESS NOTE    Progress Note  Phillip Tate  OEU:[id number] DOB: 1956-07-11 DOA: 12/09/2022 PCP: Wenda Low, MD     Brief Narrative:   Phillip Tate is an 66 y.o. male past medical history significant for essential hypertension, diabetes mellitus type 2 iron deficiency anemia, rectal cancer s/p resection PVC ablation in September 2023, revision of colostomy and repair of parastomal hernia October 2023 recently diagnosed with influenza about 4 days prior to admission, comes in to the hospital for syncopal episode that happened while standing after urination, he denies any prodromal symptoms.  Orthostatics were positive in the ED he was fluid resuscitated.  CT angio of the chest showed no PE left fifth fracture nondisplaced, CT of the head no acute findings, CT C-spine no acute fracture or subluxation   Assessment/Plan:   Orthostatic hypotension/syncope: Twelve-lead EKG showed normal sinus rhythm normal axis nonspecific T wave changes. Cardiac biomarkers have basically remained flat. Has had no events on telemetry. 2D echo is pending. He relates lightheadedness upon standing and had been happening for about 24 to 48 hours prior to admission.  Likely related to viral infection. Hold lisinopril and Norvasc, continue Coreg.  Left fifth rib fracture: Likely due to fall without complications continue incentive spirometry and pain control. Add Robaxin for pain control.  Insulin Diabetes mellitus without complication: X5Q 6.6, in May '23. Cont SSI.   Essential hypertension Well-controlled continue Norvasc Coreg and lisinopril.  History of recently diagnosed influenza: Started on Tamiflu on 12/08/2022 will complete a 5-day course. droplet precautions.   DVT prophylaxis: lovenox Family Communication:non Status is: Observation The patient remains OBS appropriate and will d/c before 2 midnights.    Code Status:     Code Status Orders   (From admission, onward)           Start     Ordered   12/09/22 2202  Full code  Continuous        12/09/22 2204           Code Status History     Date Active Date Inactive Code Status Order ID Comments User Context   05/07/2022 1836 05/09/2022 1819 Full Code [id number]  Orma Flaming, MD ED         IV Access:   Peripheral IV   Procedures and diagnostic studies:   DG Finger Middle Right  Result Date: 12/09/2022 CLINICAL DATA:  Finger pain from fall. EXAM: RIGHT MIDDLE FINGER 2+V COMPARISON:  None Available. FINDINGS: There is no evidence of fracture or dislocation. There is no evidence of arthropathy or other focal bone abnormality. Mild soft tissue swelling about the third digit. IMPRESSION: No fracture or dislocation. Mild soft tissue swelling about the third digit. Electronically Signed   By: Keane Police D.O.   On: 12/09/2022 23:25   CT Angio Chest PE W and/or Wo Contrast  Result Date: 12/09/2022 CLINICAL DATA:  Recent syncopal episode with fall, initial encounter EXAM: CT ANGIOGRAPHY CHEST WITH CONTRAST TECHNIQUE: Multidetector CT imaging of the chest was performed using the standard protocol during bolus administration of intravenous contrast. Multiplanar CT image reconstructions and MIPs were obtained to evaluate the vascular anatomy. RADIATION DOSE REDUCTION: This exam was performed according to the departmental dose-optimization program which includes automated exposure control, adjustment of the mA and/or kV according to patient size and/or use of iterative reconstruction technique. CONTRAST:  73m OMNIPAQUE IOHEXOL 350 MG/ML SOLN COMPARISON:  Chest x-ray from earlier in the same day. FINDINGS: Cardiovascular: Mild atherosclerotic  calcifications of the thoracic aorta are noted. No aneurysmal dilatation is seen. Coronary calcifications are noted. Heart is at the upper limits of normal in size. Pulmonary artery is well visualized bilaterally shows no evidence of  pulmonary emboli. Mediastinum/Nodes: Thoracic inlet is within normal limits. No hilar or mediastinal adenopathy is noted. The esophagus is within normal limits. Lungs/Pleura: Lungs are well aerated bilaterally. No focal infiltrate or effusion is seen. Upper Abdomen: No acute abnormality. Musculoskeletal: Degenerative changes of the thoracic spine are noted. Left fifth rib fracture is noted posterolaterally. No pneumothorax is noted. Review of the MIP images confirms the above findings. IMPRESSION: No evidence of pulmonary emboli. Left fifth rib fracture without complicating factors. Aortic Atherosclerosis (ICD10-I70.0). Electronically Signed   By: Inez Catalina M.D.   On: 12/09/2022 19:23   CT HEAD WO CONTRAST (5MM)  Result Date: 12/09/2022 CLINICAL DATA:  Neck Trauma EXAM: CT HEAD WITHOUT CONTRAST CT CERVICAL SPINE WITHOUT CONTRAST TECHNIQUE: Multidetector CT imaging of the head and cervical spine was performed following the standard protocol without intravenous contrast. Multiplanar CT image reconstructions of the cervical spine were also generated. RADIATION DOSE REDUCTION: This exam was performed according to the departmental dose-optimization program which includes automated exposure control, adjustment of the mA and/or kV according to patient size and/or use of iterative reconstruction technique. COMPARISON:  MRI Brain 08/12/16 FINDINGS: CT HEAD FINDINGS Brain: No evidence of acute infarction, hemorrhage, hydrocephalus, extra-axial collection or mass lesion/mass effect. Vascular: No hyperdense vessel or unexpected calcification. Skull: Normal. Negative for fracture or focal lesion. Sinuses/Orbits: No acute finding. Other: None. CT CERVICAL SPINE FINDINGS Alignment: Normal. Skull base and vertebrae: No acute fracture. No primary bone lesion or focal pathologic process. Soft tissues and spinal canal: No prevertebral fluid or swelling. No visible canal hematoma. Disc levels:  No evidence of high-grade spinal  canal stenosis. Upper chest: Negative. Other: None IMPRESSION: CT HEAD: No acute intracranial abnormality. CT CERVICAL SPINE: No acute fracture or traumatic subluxation. Electronically Signed   By: Marin Roberts M.D.   On: 12/09/2022 19:22   CT Cervical Spine Wo Contrast  Result Date: 12/09/2022 CLINICAL DATA:  Neck Trauma EXAM: CT HEAD WITHOUT CONTRAST CT CERVICAL SPINE WITHOUT CONTRAST TECHNIQUE: Multidetector CT imaging of the head and cervical spine was performed following the standard protocol without intravenous contrast. Multiplanar CT image reconstructions of the cervical spine were also generated. RADIATION DOSE REDUCTION: This exam was performed according to the departmental dose-optimization program which includes automated exposure control, adjustment of the mA and/or kV according to patient size and/or use of iterative reconstruction technique. COMPARISON:  MRI Brain 08/12/16 FINDINGS: CT HEAD FINDINGS Brain: No evidence of acute infarction, hemorrhage, hydrocephalus, extra-axial collection or mass lesion/mass effect. Vascular: No hyperdense vessel or unexpected calcification. Skull: Normal. Negative for fracture or focal lesion. Sinuses/Orbits: No acute finding. Other: None. CT CERVICAL SPINE FINDINGS Alignment: Normal. Skull base and vertebrae: No acute fracture. No primary bone lesion or focal pathologic process. Soft tissues and spinal canal: No prevertebral fluid or swelling. No visible canal hematoma. Disc levels:  No evidence of high-grade spinal canal stenosis. Upper chest: Negative. Other: None IMPRESSION: CT HEAD: No acute intracranial abnormality. CT CERVICAL SPINE: No acute fracture or traumatic subluxation. Electronically Signed   By: Marin Roberts M.D.   On: 12/09/2022 19:22   DG Chest Port 1 View  Result Date: 12/09/2022 CLINICAL DATA:  Provided history: Syncope. Additional history provided: Bilateral chest pain radiating to back, dizziness. EXAM: PORTABLE CHEST 1 VIEW  COMPARISON:  Prior chest radiographs 05/07/2022 and earlier. FINDINGS: Heart size within normal limits. Aortic atherosclerosis. No appreciable airspace consolidation or pulmonary edema. No evidence of pleural effusion or pneumothorax. No acute bony abnormality identified. Dextrocurvature of the thoracic spine. IMPRESSION: 1. No evidence of acute cardiopulmonary abnormality. 2. Aortic Atherosclerosis (ICD10-I70.0). 3. Dextrocurvature of the thoracic spine. Electronically Signed   By: Kellie Simmering D.O.   On: 12/09/2022 16:14     Medical Consultants:   None.   Subjective:    Phillip Tate relates he still has pain  Objective:    Vitals:   12/10/22 0500 12/10/22 0600 12/10/22 0700 12/10/22 0719  BP: 117/68 132/67 130/72 116/70  Pulse: 66 66 64 68  Resp: '18 19 14 20  '$ Temp:    98.6 F (37 C)  TempSrc:    Oral  SpO2: 93% 94% 95% 98%  Weight:      Height:       SpO2: 98 %   Intake/Output Summary (Last 24 hours) at 12/10/2022 0736 Last data filed at 12/09/2022 2224 Gross per 24 hour  Intake 1816.67 ml  Output --  Net 1816.67 ml   Filed Weights   12/09/22 1434  Weight: 101 kg    Exam: General exam: In no acute distress. Respiratory system: Good air movement and clear to auscultation. Cardiovascular system: S1 & S2 heard, RRR. No JVD.  Gastrointestinal system: Abdomen is nondistended, soft and nontender.  Extremities: No pedal edema. Skin: No rashes, lesions or ulcers Psychiatry: Judgement and insight appear normal. Mood & affect appropriate.    Data Reviewed:    Labs: Basic Metabolic Panel: Recent Labs  Lab 12/09/22 1457 12/10/22 0100  NA 134* 133*  K 4.3 4.6  CL 101 104  CO2 22 21*  GLUCOSE 115* 139*  BUN 13 11  CREATININE 0.97 0.72  CALCIUM 8.6* 8.5*   GFR Estimated Creatinine Clearance: 106.7 mL/min (by C-G formula based on SCr of 0.72 mg/dL). Liver Function Tests: Recent Labs  Lab 12/09/22 1457  AST 37  ALT 18  ALKPHOS 59  BILITOT 0.4   PROT 7.2  ALBUMIN 3.2*   No results for input(s): "LIPASE", "AMYLASE" in the last 168 hours. No results for input(s): "AMMONIA" in the last 168 hours. Coagulation profile No results for input(s): "INR", "PROTIME" in the last 168 hours. COVID-19 Labs  Recent Labs    12/09/22 1457  DDIMER 4.48*    No results found for: "SARSCOV2NAA"  CBC: Recent Labs  Lab 12/09/22 1457 12/10/22 0100  WBC 6.4 5.4  NEUTROABS 4.1  --   HGB 11.3* 11.2*  HCT 37.9* 37.3*  MCV 69.5* 68.9*  PLT 245 265   Cardiac Enzymes: No results for input(s): "CKTOTAL", "CKMB", "CKMBINDEX", "TROPONINI" in the last 168 hours. BNP (last 3 results) No results for input(s): "PROBNP" in the last 8760 hours. CBG: Recent Labs  Lab 12/09/22 1544 12/10/22 0000 12/10/22 0717  GLUCAP 133* 173* 119*   D-Dimer: Recent Labs    12/09/22 1457  DDIMER 4.48*   Hgb A1c: No results for input(s): "HGBA1C" in the last 72 hours. Lipid Profile: No results for input(s): "CHOL", "HDL", "LDLCALC", "TRIG", "CHOLHDL", "LDLDIRECT" in the last 72 hours. Thyroid function studies: No results for input(s): "TSH", "T4TOTAL", "T3FREE", "THYROIDAB" in the last 72 hours.  Invalid input(s): "FREET3" Anemia work up: No results for input(s): "VITAMINB12", "FOLATE", "FERRITIN", "TIBC", "IRON", "RETICCTPCT" in the last 72 hours. Sepsis Labs: Recent Labs  Lab 12/09/22 1457 12/10/22 0100  WBC 6.4 5.4  Microbiology No results found for this or any previous visit (from the past 240 hour(s)).   Medications:    amLODipine  10 mg Oral Daily   carvedilol  12.5 mg Oral BID WC   enoxaparin (LOVENOX) injection  50 mg Subcutaneous QHS   gabapentin  300 mg Oral BID   insulin aspart  0-5 Units Subcutaneous QHS   insulin aspart  0-9 Units Subcutaneous TID WC   insulin aspart  3 Units Subcutaneous TID WC   insulin glargine-yfgn  25 Units Subcutaneous BID   lisinopril  40 mg Oral Daily   oseltamivir  75 mg Oral BID   pantoprazole  40  mg Oral Daily   rosuvastatin  5 mg Oral QPM   sodium chloride flush  3 mL Intravenous Q12H   Continuous Infusions:    LOS: 0 days   Charlynne Cousins  [facility]  12/10/2022, 7:36 AM

## 2022-12-11 ENCOUNTER — Other Ambulatory Visit (HOSPITAL_COMMUNITY): Payer: Self-pay

## 2022-12-11 DIAGNOSIS — I951 Orthostatic hypotension: Secondary | ICD-10-CM | POA: Diagnosis not present

## 2022-12-11 DIAGNOSIS — S2232XA Fracture of one rib, left side, initial encounter for closed fracture: Secondary | ICD-10-CM | POA: Diagnosis not present

## 2022-12-11 DIAGNOSIS — R55 Syncope and collapse: Secondary | ICD-10-CM | POA: Diagnosis not present

## 2022-12-11 LAB — GLUCOSE, CAPILLARY
Glucose-Capillary: 130 mg/dL — ABNORMAL HIGH (ref 70–99)
Glucose-Capillary: 144 mg/dL — ABNORMAL HIGH (ref 70–99)
Glucose-Capillary: 154 mg/dL — ABNORMAL HIGH (ref 70–99)

## 2022-12-11 LAB — BASIC METABOLIC PANEL
Anion gap: 8 (ref 5–15)
BUN: 10 mg/dL (ref 8–23)
CO2: 21 mmol/L — ABNORMAL LOW (ref 22–32)
Calcium: 7.7 mg/dL — ABNORMAL LOW (ref 8.9–10.3)
Chloride: 107 mmol/L (ref 98–111)
Creatinine, Ser: 0.75 mg/dL (ref 0.61–1.24)
GFR, Estimated: >60 mL/min (ref >60)
Glucose, Bld: 134 mg/dL — ABNORMAL HIGH (ref 70–99)
Potassium: 3.7 mmol/L (ref 3.5–5.1)
Sodium: 136 mmol/L (ref 135–145)

## 2022-12-11 MED ORDER — SODIUM CHLORIDE 0.9 % IV BOLUS
1000.0000 mL | Freq: Once | INTRAVENOUS | Status: AC
  Administered 2022-12-11: 1000 mL via INTRAVENOUS

## 2022-12-11 MED ORDER — LISINOPRIL 40 MG PO TABS: 40.0000 mg | ORAL_TABLET | Freq: Every day | ORAL | Status: AC

## 2022-12-11 MED ORDER — METHOCARBAMOL 500 MG PO TABS
750.0000 mg | ORAL_TABLET | Freq: Four times a day (QID) | ORAL | 0 refills | Status: DC | PRN
  Filled 2022-12-11: qty 30, 5d supply, fill #0

## 2022-12-11 MED ORDER — AMLODIPINE BESYLATE 10 MG PO TABS: 10.0000 mg | ORAL_TABLET | Freq: Every day | ORAL | Status: DC

## 2022-12-11 NOTE — Progress Notes (Deleted)
Physician Discharge Summary  Phillip Tate MGQ:[id number] DOB: 11-14-1956 DOA: 12/09/2022  PCP: Wenda Low, MD  Admit date: 12/09/2022 Discharge date: 12/11/2022  Admitted From: Home Disposition:  Home  Recommendations for Outpatient Follow-up:  Follow up with PCP in 1-2 weeks restart antihypertensive medications as tolerated. Please obtain BMP/CBC in one week   Home Health:No Equipment/Devices:None  Discharge Condition:Stable CODE STATUS:Full Diet recommendation: Heart Healthy  Brief/Interim Summary: 66 y.o. male past medical history significant for essential hypertension, diabetes mellitus type 2 iron deficiency anemia, rectal cancer s/p resection PVC ablation in September 2023, revision of colostomy and repair of parastomal hernia October 2023 recently diagnosed with influenza about 4 days prior to admission, comes in to the hospital for syncopal episode that happened while standing after urination, he denies any prodromal symptoms.  Orthostatics were positive in the ED he was fluid resuscitated.  CT angio of the chest showed no PE left fifth fracture nondisplaced, CT of the head no acute findings, CT C-spine no acute fracture or subluxation   Discharge Diagnoses:  Principal Problem:   Syncope Active Problems:   Diabetes mellitus without complication (HCC)   Essential hypertension   Rib fracture   Influenza  Orthostatic hypotension/syncope: Twelve-lead EKG showed normal sinus rhythm nonspecific T wave changes. Cardiac biomarkers remain flat, 2D echo was done that showed, no regional wall motion abnormality EF 26% diastolic parameters were indeterminate. Orthostatics were checked which returned positive. After fluid resuscitation his lightheadedness resolved lisinopril and Norvasc were held these will be resumed as an outpatient by his PCP as needed this was in the setting of recent influenza pneumonia. The statics were repeated and it resolved, he also related  his dizziness upon standing resolved.  Left fifth rib fracture: He was started on Robaxin to his control his pain he will go home on Robaxin and incentive spirometry.  Insulin-dependent diabetes mellitus without complication: With an Z1I of 7.0 no changes made to his regimen.  Essential hypertension: Norvasc and lisinopril were held on admission he was continue on Coreg his blood pressure remained stable. Repeated orthostatic vitals were negative and his lightheadedness upon standing resolved.  History of recently diagnosed influenza pneumonia: Was continue on Tamiflu he completed a 5-day course in-house.       Discharge Instructions  Discharge Instructions     Diet - low sodium heart healthy   Complete by: As directed    Increase activity slowly   Complete by: As directed       Allergies as of 12/11/2022       Reactions   Cardizem Cd [diltiazem Hcl Er Beads] Swelling   Toprol Xl [metoprolol] Other (See Comments)   headache   Tricor [fenofibrate] Other (See Comments)   Constipation        Medication List     TAKE these medications    acetaminophen 500 MG tablet Commonly known as: TYLENOL Take 500 mg by mouth every 6 (six) hours as needed for mild pain.   amLODipine 10 MG tablet Commonly known as: NORVASC Take 1 tablet (10 mg total) by mouth daily. Start taking on: December 18, 2022 What changed: These instructions start on December 18, 2022. If you are unsure what to do until then, ask your doctor or other care provider.   carvedilol 12.5 MG tablet Commonly known as: COREG Take 12.5 mg by mouth 2 (two) times daily with a meal.   empagliflozin 25 MG Tabs tablet Commonly known as: JARDIANCE Take 25 mg by mouth daily.  gabapentin 300 MG capsule Commonly known as: NEURONTIN Take 300 mg by mouth 2 (two) times daily.   ibuprofen 200 MG tablet Commonly known as: ADVIL Take 200 mg by mouth every 6 (six) hours as needed for mild pain.   insulin  glargine 100 UNIT/ML injection Commonly known as: LANTUS Inject 36 Units into the skin at bedtime.   IRON PO Take 1 tablet by mouth daily.   lactulose 10 g packet Commonly known as: CEPHULAC Take 10 g by mouth every other day.   lisinopril 40 MG tablet Commonly known as: ZESTRIL Take 1 tablet (40 mg total) by mouth daily. Start taking on: January 22, 2023 What changed: These instructions start on January 22, 2023. If you are unsure what to do until then, ask your doctor or other care provider.   mesalamine 1.2 g EC tablet Commonly known as: LIALDA Take 2 tablets (2.4 g total) by mouth daily with breakfast.   metFORMIN 850 MG tablet Commonly known as: GLUCOPHAGE Take 850 mg by mouth 2 (two) times daily with a meal.   methocarbamol 500 MG tablet Commonly known as: ROBAXIN Take 1.5 tablets (750 mg total) by mouth every 6 (six) hours as needed for muscle spasms.   NovoLOG FlexPen 100 UNIT/ML FlexPen Generic drug: insulin aspart Inject 13-45 Units into the skin See admin instructions. Take 1 to 18 units at breakfast, 25 to 30 units at lunch, and 40 to 45 units at evening   pantoprazole 40 MG tablet Commonly known as: PROTONIX TAKE 1 TABLET TWICE DAILY What changed: when to take this   rosuvastatin 10 MG tablet Commonly known as: CRESTOR Take 1 tablet (10 mg total) by mouth daily. What changed:  how much to take when to take this   SYSTANE FREE OP Place 1 drop into both eyes in the morning and at bedtime.   zolpidem 10 MG tablet Commonly known as: AMBIEN Take 10 mg by mouth at bedtime as needed for sleep.        Allergies  Allergen Reactions   Cardizem Cd [Diltiazem Hcl Er Beads] Swelling   Toprol Xl [Metoprolol] Other (See Comments)    headache   Tricor [Fenofibrate] Other (See Comments)    Constipation     Consultations: None   Procedures/Studies: ECHOCARDIOGRAM COMPLETE  Result Date: 12/10/2022    ECHOCARDIOGRAM REPORT   Patient Name:    Phillip Tate Date of Exam: 12/10/2022 Medical Rec #:  [id number]             Height:       70.0 in Accession #:    [id number]            Weight:       222.7 lb Date of Birth:  11-07-1956            BSA:          2.185 m Patient Age:    28 years              BP:           118/63 mmHg Patient Gender: M                     HR:           64 bpm. Exam Location:  Inpatient Procedure: 2D Echo, Cardiac Doppler, Color Doppler and Intracardiac            Opacification Agent Indications:    Syncope R55  History:  Patient has no prior history of Echocardiogram examinations.                 CAD, Signs/Symptoms:Syncope; Risk Factors:Diabetes,                 Hypertension, Dyslipidemia and Non-Smoker.  Sonographer:    Greer Pickerel Referring Phys: [id number] TIMOTHY S OPYD  Sonographer Comments: Suboptimal subcostal window and suboptimal apical window. Image acquisition challenging due to patient body habitus and Image acquisition challenging due to respiratory motion. IMPRESSIONS  1. Left ventricular ejection fraction, by estimation, is 60 to 65%. The left ventricle has normal function. The left ventricle has no regional wall motion abnormalities. Left ventricular diastolic parameters are indeterminate.  2. Right ventricular systolic function is normal. The right ventricular size is normal. Tricuspid regurgitation signal is inadequate for assessing PA pressure.  3. The mitral valve is grossly normal. No evidence of mitral valve regurgitation. No evidence of mitral stenosis.  4. The aortic valve is tricuspid. There is mild calcification of the aortic valve. There is mild thickening of the aortic valve. Aortic valve regurgitation is not visualized. Aortic valve sclerosis is present, with no evidence of aortic valve stenosis.  5. The inferior vena cava is normal in size with greater than 50% respiratory variability, suggesting right atrial pressure of 3 mmHg. FINDINGS  Left Ventricle: Left ventricular ejection fraction,  by estimation, is 60 to 65%. The left ventricle has normal function. The left ventricle has no regional wall motion abnormalities. Definity contrast agent was given IV to delineate the left ventricular  endocardial borders. The left ventricular internal cavity size was normal in size. There is no left ventricular hypertrophy. Left ventricular diastolic parameters are indeterminate. Right Ventricle: The right ventricular size is normal. No increase in right ventricular wall thickness. Right ventricular systolic function is normal. Tricuspid regurgitation signal is inadequate for assessing PA pressure. Left Atrium: Left atrial size was not well visualized. Right Atrium: Right atrial size was not well visualized. Pericardium: There is no evidence of pericardial effusion. Mitral Valve: The mitral valve is grossly normal. No evidence of mitral valve regurgitation. No evidence of mitral valve stenosis. Tricuspid Valve: The tricuspid valve is grossly normal. Tricuspid valve regurgitation is trivial. No evidence of tricuspid stenosis. Aortic Valve: The aortic valve is tricuspid. There is mild calcification of the aortic valve. There is mild thickening of the aortic valve. Aortic valve regurgitation is not visualized. Aortic valve sclerosis is present, with no evidence of aortic valve stenosis. Pulmonic Valve: The pulmonic valve was grossly normal. Pulmonic valve regurgitation is trivial. No evidence of pulmonic stenosis. Aorta: The aortic root and ascending aorta are structurally normal, with no evidence of dilitation. Venous: The inferior vena cava is normal in size with greater than 50% respiratory variability, suggesting right atrial pressure of 3 mmHg. IAS/Shunts: The interatrial septum was not well visualized.  LEFT VENTRICLE PLAX 2D LVIDd:         4.40 cm     Diastology LVIDs:         3.40 cm     LV e' medial:    5.44 cm/s LV PW:         1.10 cm     LV E/e' medial:  12.0 LV IVS:        1.10 cm     LV e' lateral:    5.55 cm/s LVOT diam:     2.10 cm     LV E/e' lateral: 11.8 LV SV:  72 LV SV Index:   33 LVOT Area:     3.46 cm  LV Volumes (MOD) LV vol d, MOD A2C: 98.6 ml LV vol d, MOD A4C: 93.5 ml LV vol s, MOD A2C: 22.9 ml LV vol s, MOD A4C: 24.1 ml LV SV MOD A2C:     75.7 ml LV SV MOD A4C:     93.5 ml LV SV MOD BP:      75.3 ml RIGHT VENTRICLE RV S prime:     18.60 cm/s TAPSE (M-mode): 2.3 cm LEFT ATRIUM           Index        RIGHT ATRIUM           Index LA diam:      3.10 cm 1.42 cm/m   RA Area:     20.80 cm LA Vol (A4C): 85.5 ml 39.13 ml/m  RA Volume:   59.40 ml  27.19 ml/m  AORTIC VALVE             PULMONIC VALVE LVOT Vmax:   103.00 cm/s PR End Diast Vel: 3.37 msec LVOT Vmean:  65.500 cm/s LVOT VTI:    0.207 m  AORTA Ao Root diam: 3.50 cm Ao Asc diam:  3.40 cm MITRAL VALVE MV Area (PHT): 3.60 cm    SHUNTS MV Decel Time: 211 msec    Systemic VTI:  0.21 m MR Peak grad: 4.8 mmHg     Systemic Diam: 2.10 cm MR Vmax:      110.00 cm/s MV E velocity: 65.50 cm/s MV A velocity: 77.60 cm/s MV E/A ratio:  0.84 Eleonore Chiquito MD Electronically signed by Eleonore Chiquito MD Signature Date/Time: 12/10/2022/12:12:02 PM    Final    DG Finger Middle Right  Result Date: 12/09/2022 CLINICAL DATA:  Finger pain from fall. EXAM: RIGHT MIDDLE FINGER 2+V COMPARISON:  None Available. FINDINGS: There is no evidence of fracture or dislocation. There is no evidence of arthropathy or other focal bone abnormality. Mild soft tissue swelling about the third digit. IMPRESSION: No fracture or dislocation. Mild soft tissue swelling about the third digit. Electronically Signed   By: Keane Police D.O.   On: 12/09/2022 23:25   CT Angio Chest PE W and/or Wo Contrast  Result Date: 12/09/2022 CLINICAL DATA:  Recent syncopal episode with fall, initial encounter EXAM: CT ANGIOGRAPHY CHEST WITH CONTRAST TECHNIQUE: Multidetector CT imaging of the chest was performed using the standard protocol during bolus administration of intravenous contrast.  Multiplanar CT image reconstructions and MIPs were obtained to evaluate the vascular anatomy. RADIATION DOSE REDUCTION: This exam was performed according to the departmental dose-optimization program which includes automated exposure control, adjustment of the mA and/or kV according to patient size and/or use of iterative reconstruction technique. CONTRAST:  9m OMNIPAQUE IOHEXOL 350 MG/ML SOLN COMPARISON:  Chest x-ray from earlier in the same day. FINDINGS: Cardiovascular: Mild atherosclerotic calcifications of the thoracic aorta are noted. No aneurysmal dilatation is seen. Coronary calcifications are noted. Heart is at the upper limits of normal in size. Pulmonary artery is well visualized bilaterally shows no evidence of pulmonary emboli. Mediastinum/Nodes: Thoracic inlet is within normal limits. No hilar or mediastinal adenopathy is noted. The esophagus is within normal limits. Lungs/Pleura: Lungs are well aerated bilaterally. No focal infiltrate or effusion is seen. Upper Abdomen: No acute abnormality. Musculoskeletal: Degenerative changes of the thoracic spine are noted. Left fifth rib fracture is noted posterolaterally. No pneumothorax is noted. Review of the MIP images confirms the  above findings. IMPRESSION: No evidence of pulmonary emboli. Left fifth rib fracture without complicating factors. Aortic Atherosclerosis (ICD10-I70.0). Electronically Signed   By: Inez Catalina M.D.   On: 12/09/2022 19:23   CT HEAD WO CONTRAST (5MM)  Result Date: 12/09/2022 CLINICAL DATA:  Neck Trauma EXAM: CT HEAD WITHOUT CONTRAST CT CERVICAL SPINE WITHOUT CONTRAST TECHNIQUE: Multidetector CT imaging of the head and cervical spine was performed following the standard protocol without intravenous contrast. Multiplanar CT image reconstructions of the cervical spine were also generated. RADIATION DOSE REDUCTION: This exam was performed according to the departmental dose-optimization program which includes automated exposure  control, adjustment of the mA and/or kV according to patient size and/or use of iterative reconstruction technique. COMPARISON:  MRI Brain 08/12/16 FINDINGS: CT HEAD FINDINGS Brain: No evidence of acute infarction, hemorrhage, hydrocephalus, extra-axial collection or mass lesion/mass effect. Vascular: No hyperdense vessel or unexpected calcification. Skull: Normal. Negative for fracture or focal lesion. Sinuses/Orbits: No acute finding. Other: None. CT CERVICAL SPINE FINDINGS Alignment: Normal. Skull base and vertebrae: No acute fracture. No primary bone lesion or focal pathologic process. Soft tissues and spinal canal: No prevertebral fluid or swelling. No visible canal hematoma. Disc levels:  No evidence of high-grade spinal canal stenosis. Upper chest: Negative. Other: None IMPRESSION: CT HEAD: No acute intracranial abnormality. CT CERVICAL SPINE: No acute fracture or traumatic subluxation. Electronically Signed   By: Marin Roberts M.D.   On: 12/09/2022 19:22   CT Cervical Spine Wo Contrast  Result Date: 12/09/2022 CLINICAL DATA:  Neck Trauma EXAM: CT HEAD WITHOUT CONTRAST CT CERVICAL SPINE WITHOUT CONTRAST TECHNIQUE: Multidetector CT imaging of the head and cervical spine was performed following the standard protocol without intravenous contrast. Multiplanar CT image reconstructions of the cervical spine were also generated. RADIATION DOSE REDUCTION: This exam was performed according to the departmental dose-optimization program which includes automated exposure control, adjustment of the mA and/or kV according to patient size and/or use of iterative reconstruction technique. COMPARISON:  MRI Brain 08/12/16 FINDINGS: CT HEAD FINDINGS Brain: No evidence of acute infarction, hemorrhage, hydrocephalus, extra-axial collection or mass lesion/mass effect. Vascular: No hyperdense vessel or unexpected calcification. Skull: Normal. Negative for fracture or focal lesion. Sinuses/Orbits: No acute finding. Other: None.  CT CERVICAL SPINE FINDINGS Alignment: Normal. Skull base and vertebrae: No acute fracture. No primary bone lesion or focal pathologic process. Soft tissues and spinal canal: No prevertebral fluid or swelling. No visible canal hematoma. Disc levels:  No evidence of high-grade spinal canal stenosis. Upper chest: Negative. Other: None IMPRESSION: CT HEAD: No acute intracranial abnormality. CT CERVICAL SPINE: No acute fracture or traumatic subluxation. Electronically Signed   By: Marin Roberts M.D.   On: 12/09/2022 19:22   DG Chest Port 1 View  Result Date: 12/09/2022 CLINICAL DATA:  Provided history: Syncope. Additional history provided: Bilateral chest pain radiating to back, dizziness. EXAM: PORTABLE CHEST 1 VIEW COMPARISON:  Prior chest radiographs 05/07/2022 and earlier. FINDINGS: Heart size within normal limits. Aortic atherosclerosis. No appreciable airspace consolidation or pulmonary edema. No evidence of pleural effusion or pneumothorax. No acute bony abnormality identified. Dextrocurvature of the thoracic spine. IMPRESSION: 1. No evidence of acute cardiopulmonary abnormality. 2. Aortic Atherosclerosis (ICD10-I70.0). 3. Dextrocurvature of the thoracic spine. Electronically Signed   By: Kellie Simmering D.O.   On: 12/09/2022 16:14   (Echo, Carotid, EGD, Colonoscopy, ERCP)    Subjective: No complaint  Discharge Exam: Vitals:   12/11/22 0738 12/11/22 1107  BP: 129/78 130/75  Pulse: 71 69  Resp: 20  19  Temp: 98.1 F (36.7 C) 98 F (36.7 C)  SpO2: 96% 94%   Vitals:   12/10/22 2330 12/11/22 0400 12/11/22 0738 12/11/22 1107  BP: 124/78 113/75 129/78 130/75  Pulse:   71 69  Resp: 20 (!) '22 20 19  '$ Temp: 98 F (36.7 C) 98.4 F (36.9 C) 98.1 F (36.7 C) 98 F (36.7 C)  TempSrc: Oral Oral Oral Oral  SpO2: 93% 100% 96% 94%  Weight:      Height:        General: Pt is alert, awake, not in acute distress Cardiovascular: RRR, S1/S2 +, no rubs, no gallops Respiratory: CTA bilaterally, no  wheezing, no rhonchi Abdominal: Soft, NT, ND, bowel sounds + Extremities: no edema, no cyanosis    The results of significant diagnostics from this hospitalization (including imaging, microbiology, ancillary and laboratory) are listed below for reference.     Microbiology: No results found for this or any previous visit (from the past 240 hour(s)).   Labs: BNP (last 3 results) No results for input(s): "BNP" in the last 8760 hours. Basic Metabolic Panel: Recent Labs  Lab 12/09/22 1457 12/10/22 0100 12/11/22 0123  NA 134* 133* 136  K 4.3 4.6 3.7  CL 101 104 107  CO2 22 21* 21*  GLUCOSE 115* 139* 134*  BUN '13 11 10  '$ CREATININE 0.97 0.72 0.75  CALCIUM 8.6* 8.5* 7.7*   Liver Function Tests: Recent Labs  Lab 12/09/22 1457  AST 37  ALT 18  ALKPHOS 59  BILITOT 0.4  PROT 7.2  ALBUMIN 3.2*   No results for input(s): "LIPASE", "AMYLASE" in the last 168 hours. No results for input(s): "AMMONIA" in the last 168 hours. CBC: Recent Labs  Lab 12/09/22 1457 12/10/22 0100  WBC 6.4 5.4  NEUTROABS 4.1  --   HGB 11.3* 11.2*  HCT 37.9* 37.3*  MCV 69.5* 68.9*  PLT 245 265   Cardiac Enzymes: No results for input(s): "CKTOTAL", "CKMB", "CKMBINDEX", "TROPONINI" in the last 168 hours. BNP: Invalid input(s): "POCBNP" CBG: Recent Labs  Lab 12/10/22 1204 12/10/22 1639 12/10/22 2143 12/11/22 0615 12/11/22 1045  GLUCAP 202* 139* 154* 130* 144*   D-Dimer Recent Labs    12/09/22 1457  DDIMER 4.48*   Hgb A1c Recent Labs    12/10/22 0100  HGBA1C 7.0*   Lipid Profile No results for input(s): "CHOL", "HDL", "LDLCALC", "TRIG", "CHOLHDL", "LDLDIRECT" in the last 72 hours. Thyroid function studies No results for input(s): "TSH", "T4TOTAL", "T3FREE", "THYROIDAB" in the last 72 hours.  Invalid input(s): "FREET3" Anemia work up No results for input(s): "VITAMINB12", "FOLATE", "FERRITIN", "TIBC", "IRON", "RETICCTPCT" in the last 72 hours. Urinalysis    Component Value  Date/Time   COLORURINE YELLOW 05/07/2022 1344   APPEARANCEUR CLEAR 05/07/2022 1344   LABSPEC 1.024 05/07/2022 1344   PHURINE 8.0 05/07/2022 1344   GLUCOSEU >=500 (A) 05/07/2022 1344   HGBUR NEGATIVE 05/07/2022 1344   BILIRUBINUR NEGATIVE 05/07/2022 1344   KETONESUR 5 (A) 05/07/2022 1344   PROTEINUR 100 (A) 05/07/2022 1344   UROBILINOGEN 0.2 10/18/2007 1926   NITRITE NEGATIVE 05/07/2022 1344   LEUKOCYTESUR NEGATIVE 05/07/2022 1344   Sepsis Labs Recent Labs  Lab 12/09/22 1457 12/10/22 0100  WBC 6.4 5.4   Microbiology No results found for this or any previous visit (from the past 240 hour(s)).   SIGNED:   Charlynne Cousins, MD  [facility] 12/11/2022, 11:35 AM Pager   If 7PM-7AM, please contact night-coverage [email or web address] Password [email or web address]

## 2022-12-11 NOTE — Progress Notes (Signed)
Physical Therapy Treatment Patient Details Name: Phillip Tate MRN: [id number] DOB: 04-11-1956 Today's Date: 12/11/2022   History of Present Illness Pt is 66 yo male admitted on 12/09/22 after syncopal epsiode.  Pt with influenza and found to be orthostatic with EMS.  Pt also with L 5th rib posteriorlateral fx.  Pt with hx of HTN, DM2, anemia, rectal CA, PVC ablation, revision of colostomy.    PT Comments    Pt received in bed. Agreeable to in room ambulation, reporting walking in hallway with nursing earlier this AM. Pt had oral pain meds 2 hours prior to PT session. Mobilizing much better as compared to yesterday's session, which required IV pain meds, but pt reporting pain as 8/10. He demonstrated mod I bed mobility. Supervision transfers and amb 60' pushing IV pole. Steady gait. Pt returned to bed at end of session.    Recommendations for follow up therapy are one component of a multi-disciplinary discharge planning process, led by the attending physician.  Recommendations may be updated based on patient status, additional functional criteria and insurance authorization.  Follow Up Recommendations  No PT follow up     Assistance Recommended at Discharge PRN  Patient can return home with the following A little help with bathing/dressing/bathroom;Assistance with cooking/housework;Help with stairs or ramp for entrance;Assist for transportation   Equipment Recommendations  None recommended by PT    Recommendations for Other Services       Precautions / Restrictions Precautions Precautions: None     Mobility  Bed Mobility Overal bed mobility: Modified Independent             General bed mobility comments: increased time    Transfers Overall transfer level: Needs assistance Equipment used: Ambulation equipment used Transfers: Sit to/from Stand Sit to Stand: Supervision           General transfer comment: supervision for safety, increased time     Ambulation/Gait Ambulation/Gait assistance: Supervision Gait Distance (Feet): 60 Feet Assistive device: IV Pole Gait Pattern/deviations: Step-through pattern Gait velocity: decreased Gait velocity interpretation: 1.31 - 2.62 ft/sec, indicative of limited community ambulator   General Gait Details: steady Radio broadcast assistant    Modified Rankin (Stroke Patients Only)       Balance Overall balance assessment: No apparent balance deficits (not formally assessed)                                          Cognition Arousal/Alertness: Awake/alert Behavior During Therapy: WFL for tasks assessed/performed Overall Cognitive Status: Within Functional Limits for tasks assessed                                          Exercises      General Comments General comments (skin integrity, edema, etc.): VSS on RA. Pt reports ambulating with RN earlier this AM. He reports his pain was well managed at that time having just received oral pain meds. Pain meds were received 2 hours prior to PT session. He did move much better as compared to yesterday's session but reporting pain as 8/10.      Pertinent Vitals/Pain Pain Assessment Pain Assessment: 0-10 Pain Score: 8  Pain Location: L flank/back Pain Descriptors /  Indicators: Tightness, Grimacing, Guarding Pain Intervention(s): Limited activity within patient's tolerance, Monitored during session, Repositioned, Premedicated before session    Home Living                          Prior Function            PT Goals (current goals can now be found in the care plan section) Acute Rehab PT Goals Patient Stated Goal: decrease pain Progress towards PT goals: Progressing toward goals    Frequency    Min 3X/week      PT Plan Current plan remains appropriate    Co-evaluation              AM-PAC PT "6 Clicks" Mobility   Outcome Measure  Help needed  turning from your back to your side while in a flat bed without using bedrails?: None Help needed moving from lying on your back to sitting on the side of a flat bed without using bedrails?: None Help needed moving to and from a bed to a chair (including a wheelchair)?: A Little Help needed standing up from a chair using your arms (e.g., wheelchair or bedside chair)?: A Little Help needed to walk in hospital room?: A Little Help needed climbing 3-5 steps with a railing? : A Little 6 Click Score: 20    End of Session   Activity Tolerance: Patient tolerated treatment well Patient left: in bed;with call bell/phone within reach Nurse Communication: Mobility status PT Visit Diagnosis: Other abnormalities of gait and mobility (R26.89);Pain Pain - Right/Left: Left     Time: 5784-6962 PT Time Calculation (min) (ACUTE ONLY): 15 min  Charges:  $Gait Training: 8-22 mins                     [name], PT  Office # [phone] Pager [phone]    [name] 12/11/2022, 8:04 AM

## 2022-12-13 ENCOUNTER — Ambulatory Visit (HOSPITAL_COMMUNITY)
Admission: EM | Admit: 2022-12-13 | Discharge: 2022-12-13 | Discharge status: Against Medical Advice | Payer: Medicare HMO

## 2022-12-14 DIAGNOSIS — R059 Cough, unspecified: Secondary | ICD-10-CM | POA: Diagnosis not present

## 2022-12-14 DIAGNOSIS — R0602 Shortness of breath: Secondary | ICD-10-CM | POA: Diagnosis not present

## 2022-12-14 DIAGNOSIS — S2232XA Fracture of one rib, left side, initial encounter for closed fracture: Secondary | ICD-10-CM | POA: Diagnosis not present

## 2022-12-14 NOTE — Discharge Summary (Addendum)
Physician Discharge Summary  Phillip Tate WUJ:[id number] DOB: 1956-06-08 DOA: 12/09/2022  PCP: Phillip Low, MD  Admit date: 12/09/2022 Discharge date: 12/11/2022  Admitted From: Home Disposition:  Home  Recommendations for Outpatient Follow-up:  Follow up with PCP in 1-2 weeks restart antihypertensive medications as tolerated. Please obtain BMP/CBC in one week   Home Health:No Equipment/Devices:None  Discharge Condition:Stable CODE STATUS:Full Diet recommendation: Heart Healthy  Brief/Interim Summary: 66 y.o. male past medical history significant for essential hypertension, diabetes mellitus type 2 iron deficiency anemia, rectal cancer s/p resection PVC ablation in September 2023, revision of colostomy and repair of parastomal hernia October 2023 recently diagnosed with influenza about 4 days prior to admission, comes in to the hospital for syncopal episode that happened while standing after urination, he denies any prodromal symptoms.  Orthostatics were positive in the ED he was fluid resuscitated.  CT angio of the chest showed no PE left fifth fracture nondisplaced, CT of the head no acute findings, CT C-spine no acute fracture or subluxation   Discharge Diagnoses:  Principal Problem:   Syncope Active Problems:   Diabetes mellitus without complication (HCC)   Essential hypertension   Rib fracture   Influenza  Orthostatic hypotension/syncope: Twelve-lead EKG showed normal sinus rhythm nonspecific T wave changes. Cardiac biomarkers remain flat, 2D echo was done that showed, no regional wall motion abnormality EF 95% diastolic parameters were indeterminate. Orthostatics were checked which returned positive. After fluid resuscitation his lightheadedness resolved lisinopril and Norvasc were held these will be resumed as an outpatient by his PCP as needed this was in the setting of recent influenza pneumonia. The statics were repeated and it resolved, he also related  his dizziness upon standing resolved.  Left fifth rib fracture: He was started on Robaxin to his control his pain he will go home on Robaxin and incentive spirometry.  Insulin-dependent diabetes mellitus without complication: With an A2Z of 7.0 no changes made to his regimen.  Essential hypertension: Norvasc and lisinopril were held on admission he was continue on Coreg his blood pressure remained stable. Repeated orthostatic vitals were negative and his lightheadedness upon standing resolved.  History of recently diagnosed influenza pneumonia: Was continue on Tamiflu he completed a 5-day course in-house.       Discharge Instructions  Discharge Instructions     Diet - Tate sodium heart healthy   Complete by: As directed    Increase activity slowly   Complete by: As directed       Allergies as of 12/11/2022       Reactions   Cardizem Cd [diltiazem Hcl Er Beads] Swelling   Toprol Xl [metoprolol] Other (See Comments)   headache   Tricor [fenofibrate] Other (See Comments)   Constipation        Medication List     TAKE these medications    acetaminophen 500 MG tablet Commonly known as: TYLENOL Take 500 mg by mouth every 6 (six) hours as needed for mild pain.   amLODipine 10 MG tablet Commonly known as: NORVASC Take 1 tablet (10 mg total) by mouth daily. Start taking on: December 18, 2022 What changed: These instructions start on December 18, 2022. If you are unsure what to do until then, ask your doctor or other care provider.   carvedilol 12.5 MG tablet Commonly known as: COREG Take 12.5 mg by mouth 2 (two) times daily with a meal.   empagliflozin 25 MG Tabs tablet Commonly known as: JARDIANCE Take 25 mg by mouth daily.  gabapentin 300 MG capsule Commonly known as: NEURONTIN Take 300 mg by mouth 2 (two) times daily.   ibuprofen 200 MG tablet Commonly known as: ADVIL Take 200 mg by mouth every 6 (six) hours as needed for mild pain.   insulin  glargine 100 UNIT/ML injection Commonly known as: LANTUS Inject 36 Units into the skin at bedtime.   IRON PO Take 1 tablet by mouth daily.   lactulose 10 g packet Commonly known as: CEPHULAC Take 10 g by mouth every other day.   lisinopril 40 MG tablet Commonly known as: ZESTRIL Take 1 tablet (40 mg total) by mouth daily. Start taking on: January 22, 2023 What changed: These instructions start on January 22, 2023. If you are unsure what to do until then, ask your doctor or other care provider.   mesalamine 1.2 g EC tablet Commonly known as: LIALDA Take 2 tablets (2.4 g total) by mouth daily with breakfast.   metFORMIN 850 MG tablet Commonly known as: GLUCOPHAGE Take 850 mg by mouth 2 (two) times daily with a meal.   methocarbamol 500 MG tablet Commonly known as: ROBAXIN Take 1 and 1/2 tablets (750 mg total) by mouth every 6 (six) hours as needed for muscle spasms.   NovoLOG FlexPen 100 UNIT/ML FlexPen Generic drug: insulin aspart Inject 13-45 Units into the skin See admin instructions. Take 1 to 18 units at breakfast, 25 to 30 units at lunch, and 40 to 45 units at evening   pantoprazole 40 MG tablet Commonly known as: PROTONIX TAKE 1 TABLET TWICE DAILY What changed: when to take this   rosuvastatin 10 MG tablet Commonly known as: CRESTOR Take 1 tablet (10 mg total) by mouth daily. What changed:  how much to take when to take this   SYSTANE FREE OP Place 1 drop into both eyes in the morning and at bedtime.   zolpidem 10 MG tablet Commonly known as: AMBIEN Take 10 mg by mouth at bedtime as needed for sleep.        Allergies  Allergen Reactions   Cardizem Cd [Diltiazem Hcl Er Beads] Swelling   Toprol Xl [Metoprolol] Other (See Comments)    headache   Tricor [Fenofibrate] Other (See Comments)    Constipation     Consultations: None   Procedures/Studies: ECHOCARDIOGRAM COMPLETE  Result Date: 12/10/2022    ECHOCARDIOGRAM REPORT   Patient Name:    Phillip Tate Date of Exam: 12/10/2022 Medical Rec #:  [id number]             Height:       70.0 in Accession #:    [id number]            Weight:       222.7 lb Date of Birth:  1956/03/15            BSA:          2.185 m Patient Age:    67 years              BP:           118/63 mmHg Patient Gender: M                     HR:           64 bpm. Exam Location:  Inpatient Procedure: 2D Echo, Cardiac Doppler, Color Doppler and Intracardiac            Opacification Agent Indications:    Syncope R55  History:        Patient has no prior history of Echocardiogram examinations.                 CAD, Signs/Symptoms:Syncope; Risk Factors:Diabetes,                 Hypertension, Dyslipidemia and Non-Smoker.  Sonographer:    Greer Pickerel Referring Phys: [id number] TIMOTHY S OPYD  Sonographer Comments: Suboptimal subcostal window and suboptimal apical window. Image acquisition challenging due to patient body habitus and Image acquisition challenging due to respiratory motion. IMPRESSIONS  1. Left ventricular ejection fraction, by estimation, is 60 to 65%. The left ventricle has normal function. The left ventricle has no regional wall motion abnormalities. Left ventricular diastolic parameters are indeterminate.  2. Right ventricular systolic function is normal. The right ventricular size is normal. Tricuspid regurgitation signal is inadequate for assessing PA pressure.  3. The mitral valve is grossly normal. No evidence of mitral valve regurgitation. No evidence of mitral stenosis.  4. The aortic valve is tricuspid. There is mild calcification of the aortic valve. There is mild thickening of the aortic valve. Aortic valve regurgitation is not visualized. Aortic valve sclerosis is present, with no evidence of aortic valve stenosis.  5. The inferior vena cava is normal in size with greater than 50% respiratory variability, suggesting right atrial pressure of 3 mmHg. FINDINGS  Left Ventricle: Left ventricular ejection fraction,  by estimation, is 60 to 65%. The left ventricle has normal function. The left ventricle has no regional wall motion abnormalities. Definity contrast agent was given IV to delineate the left ventricular  endocardial borders. The left ventricular internal cavity size was normal in size. There is no left ventricular hypertrophy. Left ventricular diastolic parameters are indeterminate. Right Ventricle: The right ventricular size is normal. No increase in right ventricular wall thickness. Right ventricular systolic function is normal. Tricuspid regurgitation signal is inadequate for assessing PA pressure. Left Atrium: Left atrial size was not well visualized. Right Atrium: Right atrial size was not well visualized. Pericardium: There is no evidence of pericardial effusion. Mitral Valve: The mitral valve is grossly normal. No evidence of mitral valve regurgitation. No evidence of mitral valve stenosis. Tricuspid Valve: The tricuspid valve is grossly normal. Tricuspid valve regurgitation is trivial. No evidence of tricuspid stenosis. Aortic Valve: The aortic valve is tricuspid. There is mild calcification of the aortic valve. There is mild thickening of the aortic valve. Aortic valve regurgitation is not visualized. Aortic valve sclerosis is present, with no evidence of aortic valve stenosis. Pulmonic Valve: The pulmonic valve was grossly normal. Pulmonic valve regurgitation is trivial. No evidence of pulmonic stenosis. Aorta: The aortic root and ascending aorta are structurally normal, with no evidence of dilitation. Venous: The inferior vena cava is normal in size with greater than 50% respiratory variability, suggesting right atrial pressure of 3 mmHg. IAS/Shunts: The interatrial septum was not well visualized.  LEFT VENTRICLE PLAX 2D LVIDd:         4.40 cm     Diastology LVIDs:         3.40 cm     LV e' medial:    5.44 cm/s LV PW:         1.10 cm     LV E/e' medial:  12.0 LV IVS:        1.10 cm     LV e' lateral:    5.55 cm/s LVOT diam:     2.10 cm     LV  E/e' lateral: 11.8 LV SV:         72 LV SV Index:   33 LVOT Area:     3.46 cm  LV Volumes (MOD) LV vol d, MOD A2C: 98.6 ml LV vol d, MOD A4C: 93.5 ml LV vol s, MOD A2C: 22.9 ml LV vol s, MOD A4C: 24.1 ml LV SV MOD A2C:     75.7 ml LV SV MOD A4C:     93.5 ml LV SV MOD BP:      75.3 ml RIGHT VENTRICLE RV S prime:     18.60 cm/s TAPSE (M-mode): 2.3 cm LEFT ATRIUM           Index        RIGHT ATRIUM           Index LA diam:      3.10 cm 1.42 cm/m   RA Area:     20.80 cm LA Vol (A4C): 85.5 ml 39.13 ml/m  RA Volume:   59.40 ml  27.19 ml/m  AORTIC VALVE             PULMONIC VALVE LVOT Vmax:   103.00 cm/s PR End Diast Vel: 3.37 msec LVOT Vmean:  65.500 cm/s LVOT VTI:    0.207 m  AORTA Ao Root diam: 3.50 cm Ao Asc diam:  3.40 cm MITRAL VALVE MV Area (PHT): 3.60 cm    SHUNTS MV Decel Time: 211 msec    Systemic VTI:  0.21 m MR Peak grad: 4.8 mmHg     Systemic Diam: 2.10 cm MR Vmax:      110.00 cm/s MV E velocity: 65.50 cm/s MV A velocity: 77.60 cm/s MV E/A ratio:  0.84 Eleonore Chiquito MD Electronically signed by Eleonore Chiquito MD Signature Date/Time: 12/10/2022/12:12:02 PM    Final    DG Finger Middle Right  Result Date: 12/09/2022 CLINICAL DATA:  Finger pain from fall. EXAM: RIGHT MIDDLE FINGER 2+V COMPARISON:  None Available. FINDINGS: There is no evidence of fracture or dislocation. There is no evidence of arthropathy or other focal bone abnormality. Mild soft tissue swelling about the third digit. IMPRESSION: No fracture or dislocation. Mild soft tissue swelling about the third digit. Electronically Signed   By: Keane Police D.O.   On: 12/09/2022 23:25   CT Angio Chest PE W and/or Wo Contrast  Result Date: 12/09/2022 CLINICAL DATA:  Recent syncopal episode with fall, initial encounter EXAM: CT ANGIOGRAPHY CHEST WITH CONTRAST TECHNIQUE: Multidetector CT imaging of the chest was performed using the standard protocol during bolus administration of intravenous contrast.  Multiplanar CT image reconstructions and MIPs were obtained to evaluate the vascular anatomy. RADIATION DOSE REDUCTION: This exam was performed according to the departmental dose-optimization program which includes automated exposure control, adjustment of the mA and/or kV according to patient size and/or use of iterative reconstruction technique. CONTRAST:  80m OMNIPAQUE IOHEXOL 350 MG/ML SOLN COMPARISON:  Chest x-ray from earlier in the same day. FINDINGS: Cardiovascular: Mild atherosclerotic calcifications of the thoracic aorta are noted. No aneurysmal dilatation is seen. Coronary calcifications are noted. Heart is at the upper limits of normal in size. Pulmonary artery is well visualized bilaterally shows no evidence of pulmonary emboli. Mediastinum/Nodes: Thoracic inlet is within normal limits. No hilar or mediastinal adenopathy is noted. The esophagus is within normal limits. Lungs/Pleura: Lungs are well aerated bilaterally. No focal infiltrate or effusion is seen. Upper Abdomen: No acute abnormality. Musculoskeletal: Degenerative changes of the thoracic spine are noted. Left fifth rib fracture is  noted posterolaterally. No pneumothorax is noted. Review of the MIP images confirms the above findings. IMPRESSION: No evidence of pulmonary emboli. Left fifth rib fracture without complicating factors. Aortic Atherosclerosis (ICD10-I70.0). Electronically Signed   By: Inez Catalina M.D.   On: 12/09/2022 19:23   CT HEAD WO CONTRAST (5MM)  Result Date: 12/09/2022 CLINICAL DATA:  Neck Trauma EXAM: CT HEAD WITHOUT CONTRAST CT CERVICAL SPINE WITHOUT CONTRAST TECHNIQUE: Multidetector CT imaging of the head and cervical spine was performed following the standard protocol without intravenous contrast. Multiplanar CT image reconstructions of the cervical spine were also generated. RADIATION DOSE REDUCTION: This exam was performed according to the departmental dose-optimization program which includes automated exposure  control, adjustment of the mA and/or kV according to patient size and/or use of iterative reconstruction technique. COMPARISON:  MRI Brain 08/12/16 FINDINGS: CT HEAD FINDINGS Brain: No evidence of acute infarction, hemorrhage, hydrocephalus, extra-axial collection or mass lesion/mass effect. Vascular: No hyperdense vessel or unexpected calcification. Skull: Normal. Negative for fracture or focal lesion. Sinuses/Orbits: No acute finding. Other: None. CT CERVICAL SPINE FINDINGS Alignment: Normal. Skull base and vertebrae: No acute fracture. No primary bone lesion or focal pathologic process. Soft tissues and spinal canal: No prevertebral fluid or swelling. No visible canal hematoma. Disc levels:  No evidence of high-grade spinal canal stenosis. Upper chest: Negative. Other: None IMPRESSION: CT HEAD: No acute intracranial abnormality. CT CERVICAL SPINE: No acute fracture or traumatic subluxation. Electronically Signed   By: Marin Roberts M.D.   On: 12/09/2022 19:22   CT Cervical Spine Wo Contrast  Result Date: 12/09/2022 CLINICAL DATA:  Neck Trauma EXAM: CT HEAD WITHOUT CONTRAST CT CERVICAL SPINE WITHOUT CONTRAST TECHNIQUE: Multidetector CT imaging of the head and cervical spine was performed following the standard protocol without intravenous contrast. Multiplanar CT image reconstructions of the cervical spine were also generated. RADIATION DOSE REDUCTION: This exam was performed according to the departmental dose-optimization program which includes automated exposure control, adjustment of the mA and/or kV according to patient size and/or use of iterative reconstruction technique. COMPARISON:  MRI Brain 08/12/16 FINDINGS: CT HEAD FINDINGS Brain: No evidence of acute infarction, hemorrhage, hydrocephalus, extra-axial collection or mass lesion/mass effect. Vascular: No hyperdense vessel or unexpected calcification. Skull: Normal. Negative for fracture or focal lesion. Sinuses/Orbits: No acute finding. Other: None.  CT CERVICAL SPINE FINDINGS Alignment: Normal. Skull base and vertebrae: No acute fracture. No primary bone lesion or focal pathologic process. Soft tissues and spinal canal: No prevertebral fluid or swelling. No visible canal hematoma. Disc levels:  No evidence of high-grade spinal canal stenosis. Upper chest: Negative. Other: None IMPRESSION: CT HEAD: No acute intracranial abnormality. CT CERVICAL SPINE: No acute fracture or traumatic subluxation. Electronically Signed   By: Marin Roberts M.D.   On: 12/09/2022 19:22   DG Chest Port 1 View  Result Date: 12/09/2022 CLINICAL DATA:  Provided history: Syncope. Additional history provided: Bilateral chest pain radiating to back, dizziness. EXAM: PORTABLE CHEST 1 VIEW COMPARISON:  Prior chest radiographs 05/07/2022 and earlier. FINDINGS: Heart size within normal limits. Aortic atherosclerosis. No appreciable airspace consolidation or pulmonary edema. No evidence of pleural effusion or pneumothorax. No acute bony abnormality identified. Dextrocurvature of the thoracic spine. IMPRESSION: 1. No evidence of acute cardiopulmonary abnormality. 2. Aortic Atherosclerosis (ICD10-I70.0). 3. Dextrocurvature of the thoracic spine. Electronically Signed   By: Kellie Simmering D.O.   On: 12/09/2022 16:14   (Echo, Carotid, EGD, Colonoscopy, ERCP)    Subjective: No complaint  Discharge Exam: Vitals:   12/11/22 9379  12/11/22 1107  BP: 129/78 130/75  Pulse: 71 69  Resp: 20 19  Temp: 98.1 F (36.7 C) 98 F (36.7 C)  SpO2: 96% 94%   Vitals:   12/10/22 2330 12/11/22 0400 12/11/22 0738 12/11/22 1107  BP: 124/78 113/75 129/78 130/75  Pulse:   71 69  Resp: 20 (!) '22 20 19  '$ Temp: 98 F (36.7 C) 98.4 F (36.9 C) 98.1 F (36.7 C) 98 F (36.7 C)  TempSrc: Oral Oral Oral Oral  SpO2: 93% 100% 96% 94%  Weight:      Height:        General: Pt is alert, awake, not in acute distress Cardiovascular: RRR, S1/S2 +, no rubs, no gallops Respiratory: CTA bilaterally, no  wheezing, no rhonchi Abdominal: Soft, NT, ND, bowel sounds + Extremities: no edema, no cyanosis    The results of significant diagnostics from this hospitalization (including imaging, microbiology, ancillary and laboratory) are listed below for reference.     Microbiology: No results found for this or any previous visit (from the past 240 hour(s)).   Labs: BNP (last 3 results) No results for input(s): "BNP" in the last 8760 hours. Basic Metabolic Panel: Recent Labs  Lab 12/09/22 1457 12/10/22 0100 12/11/22 0123  NA 134* 133* 136  K 4.3 4.6 3.7  CL 101 104 107  CO2 22 21* 21*  GLUCOSE 115* 139* 134*  BUN '13 11 10  '$ CREATININE 0.97 0.72 0.75  CALCIUM 8.6* 8.5* 7.7*    Liver Function Tests: Recent Labs  Lab 12/09/22 1457  AST 37  ALT 18  ALKPHOS 59  BILITOT 0.4  PROT 7.2  ALBUMIN 3.2*    No results for input(s): "LIPASE", "AMYLASE" in the last 168 hours. No results for input(s): "AMMONIA" in the last 168 hours. CBC: Recent Labs  Lab 12/09/22 1457 12/10/22 0100  WBC 6.4 5.4  NEUTROABS 4.1  --   HGB 11.3* 11.2*  HCT 37.9* 37.3*  MCV 69.5* 68.9*  PLT 245 265    Cardiac Enzymes: No results for input(s): "CKTOTAL", "CKMB", "CKMBINDEX", "TROPONINI" in the last 168 hours. BNP: Invalid input(s): "POCBNP" CBG: Recent Labs  Lab 12/10/22 1639 12/10/22 2143 12/11/22 0615 12/11/22 1045 12/11/22 1620  GLUCAP 139* 154* 130* 144* 154*    D-Dimer No results for input(s): "DDIMER" in the last 72 hours.  Hgb A1c No results for input(s): "HGBA1C" in the last 72 hours.  Lipid Profile No results for input(s): "CHOL", "HDL", "LDLCALC", "TRIG", "CHOLHDL", "LDLDIRECT" in the last 72 hours. Thyroid function studies No results for input(s): "TSH", "T4TOTAL", "T3FREE", "THYROIDAB" in the last 72 hours.  Invalid input(s): "FREET3" Anemia work up No results for input(s): "VITAMINB12", "FOLATE", "FERRITIN", "TIBC", "IRON", "RETICCTPCT" in the last 72  hours. Urinalysis    Component Value Date/Time   COLORURINE YELLOW 05/07/2022 1344   APPEARANCEUR CLEAR 05/07/2022 1344   LABSPEC 1.024 05/07/2022 1344   PHURINE 8.0 05/07/2022 1344   GLUCOSEU >=500 (A) 05/07/2022 1344   HGBUR NEGATIVE 05/07/2022 1344   BILIRUBINUR NEGATIVE 05/07/2022 1344   KETONESUR 5 (A) 05/07/2022 1344   PROTEINUR 100 (A) 05/07/2022 1344   UROBILINOGEN 0.2 10/18/2007 1926   NITRITE NEGATIVE 05/07/2022 1344   LEUKOCYTESUR NEGATIVE 05/07/2022 1344   Sepsis Labs Recent Labs  Lab 12/09/22 1457 12/10/22 0100  WBC 6.4 5.4    Microbiology No results found for this or any previous visit (from the past 240 hour(s)).   SIGNED:   Charlynne Cousins, MD  [facility] 12/14/2022, 10:05  AM Pager   If 7PM-7AM, please contact night-coverage [email or web address] Password [email or web address]

## 2022-12-16 DIAGNOSIS — Z933 Colostomy status: Secondary | ICD-10-CM | POA: Diagnosis not present

## 2022-12-24 ENCOUNTER — Ambulatory Visit: Payer: Medicare HMO | Attending: Internal Medicine

## 2022-12-24 DIAGNOSIS — R42 Dizziness and giddiness: Secondary | ICD-10-CM | POA: Diagnosis not present

## 2022-12-24 DIAGNOSIS — M6281 Muscle weakness (generalized): Secondary | ICD-10-CM | POA: Diagnosis not present

## 2022-12-24 DIAGNOSIS — R2689 Other abnormalities of gait and mobility: Secondary | ICD-10-CM | POA: Insufficient documentation

## 2022-12-24 NOTE — Therapy (Signed)
OUTPATIENT PHYSICAL THERAPY NEURO EVALUATION   Patient Name: Phillip Tate MRN: [id number] DOB:1956/09/04, 66 y.o., male Today's Date: 12/24/2022   PCP: Wenda Low, MD REFERRING PROVIDER: Wenda Low, MD  END OF SESSION:  PT End of Session - 12/24/22 1436     Visit Number 1    Number of Visits 5    Date for PT Re-Evaluation 01/21/23    Authorization Type [employer]    PT Start Time 1442    PT Stop Time 1525    PT Time Calculation (min) 43 min    Activity Tolerance Patient tolerated treatment well    Behavior During Therapy WFL for tasks assessed/performed             Past Medical History:  Diagnosis Date   Coronary artery disease    DDD (degenerative disc disease), lumbar    Diabetes mellitus without complication ([place])    Dyslipidemia    History of rectal cancer    dx 2006 --  Stage III, T3  N1  M0,  s/p  chemoradiation (07-10-2005 to 09-05-2005) and anterior colon resection 11-04-2006   History of shingles    Hyperlipidemia    Hypertension    Iron deficiency anemia    intolerent PO iron--  gets IV iron,  last one 03-12-2015 ([facility])   Organic impotence    Rectal cancer ([place])    adenocarcinoma stage lll   Past Surgical History:  Procedure Laterality Date   ANAL RECTAL MANOMETRY N/A 09/04/2021   Procedure: ANO RECTAL MANOMETRY;  Surgeon: Mauri Pole, MD;  Location: [facility];  Service: Endoscopy;  Laterality: N/A;   ANTERIOR COLON RESECTION W/ COLOSTOMY  11-04-2006   CATARACT EXTRACTION W/ INTRAOCULAR LENS IMPLANT Right sept 2015  &  feb 2016   COLONOSCOPY WITH ESOPHAGOGASTRODUODENOSCOPY (EGD)  last one 03-07-2010   COLONOSCOPY WITH PROPOFOL N/A 10/23/2015   Procedure: COLONOSCOPY WITH PROPOFOL;  Surgeon: Garlan Fair, MD;  [facility];  Service: Endoscopy;  Laterality: N/A;   COLOSTOMY TAKEDOWN  june 2007   EXAMINATION UNDER ANESTHESIA N/A 04/23/2015   Procedure: EXAM UNDER ANESTHESIA, MANIPULATION OF PENILE  PROSTHESIS PUMP;  Surgeon: Kathie Rhodes, MD;  Location: [facility];  Service: Urology;  Laterality: N/A;   PENILE PROSTHESIS PLACEMENT  10-29-2009   PORT A CATH PLACEMENT  07-23-2005   prolapsed hemorrhoid     Patient Active Problem List   Diagnosis Date Noted   Syncope 12/09/2022   Rib fracture 12/09/2022   Influenza 12/09/2022   Nonhealing nonsurgical wound 12/01/2022   Incisional hernia, without obstruction or gangrene    Irritant contact dermatitis associated with fecal stoma    Colostomy complication (HCC)    Frequent PVCs 05/07/2022   Tinnitus of left ear 05/23/2021   Sleep disorder 05/23/2021   Paresthesia 05/23/2021   Neuropathy 05/23/2021   Dysphagia 05/23/2021   Allergic rhinitis 05/23/2021   Rectal pain 03/05/2021   Pure hypercholesterolemia 03/05/2021   Polyneuropathy due to type 2 diabetes mellitus ([place]) 03/05/2021   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   Long term (current) use of insulin ([place]) 67/67/2094   Lichen planus 70/96/2836   Irregular bowel habits 03/05/2021   Iron deficiency anemia 03/05/2021   Incontinence of feces 03/05/2021   Hyperglycemia due to type 2 diabetes mellitus ([place]) 03/05/2021   History of rectal cancer with ostomy  03/05/2021   History of iron deficiency 03/05/2021   Gastroesophageal reflux disease 03/05/2021   Diabetic retinopathy associated with type 2 diabetes mellitus ([place])  03/05/2021   Diabetic renal disease ([place]) 03/05/2021   Constipation 03/05/2021   Chronic kidney disease, stage 1 03/05/2021   Cervical disc disease 03/05/2021   Rectal mucosa prolapse 10/26/2018   Essential hypertension 09/18/2016   Hyperlipidemia 09/18/2016   Diabetes mellitus without complication ([place]) 52/84/1324   Pityriasis lichenoides 40/09/2724    ONSET DATE: 12/08/2022 referral   REFERRING DIAG:  R26.9 (ICD-10-CM) - Unspecified abnormalities of gait and mobility    THERAPY DIAG:  Other abnormalities of gait and mobility  Muscle  weakness (generalized)  Dizziness and giddiness  Rationale for Evaluation and Treatment: Rehabilitation  SUBJECTIVE:                                                                                                                                                                                             SUBJECTIVE STATEMENT: Patient arrives to clinic by himself. Ambulating into clinic with no AD. Reports having vertigo off and on for 2 years and he looked up a youtube video for the Epley and that has helped him in the past. In October, he had multiple major surgeries and noted an increase in vertigo experience. Did have a syncopal episode earlier in December, likely related to dehydration and recent Flu diagnosis. Did take meclizine last night.  Pt accompanied by: self  PERTINENT HISTORY: essential hypertension, diabetes mellitus type 2 iron deficiency anemia, rectal cancer s/p resection PVC ablation in September 2023, revision of colostomy and repair of parastomal hernia October 2023 recently diagnosed with influenza  PAIN:  Are you having pain? Yes: NPRS scale: 4/10 Pain location: B ribs due to fx  Pain description: achy   PRECAUTIONS: Fall and Other: colostomy  WEIGHT BEARING RESTRICTIONS: No  FALLS: Has patient fallen in last 6 months? Yes. Number of falls 1, syncopal episode  LIVING ENVIRONMENT: Lives with: lives with their family Lives in: House/apartment Stairs: Yes: External: "a few" steps; none Has following equipment at home: None  PLOF: Independent  PATIENT GOALS: "get rid of the dizziness"  OBJECTIVE:   COGNITION: Overall cognitive status: Within functional limits for tasks assessed   SENSATION: WFL  COORDINATION: WFL  POSTURE: No Significant postural limitations  LOWER EXTREMITY MMT:   WFL  BED MOBILITY:  Occasional difficulty due to dizziness  GAIT: Gait pattern: WFL Distance walked: clinic Assistive device utilized: None Level of assistance:  Complete Independence  Vestibular Asssessment   General Observation: NAD    Symptom Behavior:   Subjective history: see above   Non-Vestibular symptoms: nausea/vomiting   Type of dizziness: Spinning/Vertigo   Frequency: varies from 1x a day to 2-3x a day  Duration: seconds to minutes   Aggravating factors: Induced by position change: lying supine, rolling to the right, rolling to the left, and supine to sit and Induced by motion: turning body quickly and turning head quickly   Relieving factors: lying supine, closing eyes, rest, and slow movements   Progression of symptoms: worse   Oculomotor Exam:   Ocular Alignment: normal   Ocular ROM: No Limitations   Spontaneous Nystagmus: absent   Gaze-Induced Nystagmus: absent   Smooth Pursuits: intact   Saccades: intact   Convergence/Divergence: <5 cm     Vestibular-Ocular Reflex (VOR):   Slow VOR: Normal   VOR Cancellation: Normal   Head-Impulse Test: HIT Right: negative HIT Left: negative   Dynamic Visual Acuity: Static: 9 Dynamic: 7    Positional Testing: Right Dix-Hallpike: no nystagmus Left Dix-Hallpike: no nystagmus Right Roll Test: no nystagmus Left Roll Test: no nystagmus Right Sidelying: no nystagmus Left Sidelying: no nystagmus    Motion Sensitivity:   Motion Sensitivity Quotient  Intensity: 0 = none, 1 = Lightheaded, 2 = Mild, 3 = Moderate, 4 = Severe, 5 = Vomiting Duration: < 5 s = 0, 5-10s = 1,11-30s = 2, >30s = 3 Score = Intensity + duration     Intensity Duration Score  1. Sitting to supine 0    2. Supine to L side 0    3. Supine to R side 0    4. Supine to sitting 0    5. L Hallpike-Dix 0    6. Up from L  0    7. R Hallpike-Dix 0    8. Up from R  0    9. Sitting, head  tipped to L knee 0    10. Head up from L  knee 0    11. Sitting, head  tipped to R knee 0    12. Head up from R  knee 0    13. Sitting head turns x5 0    14.Sitting head nods x5 0    15. In stance, 180  turn to L  0     16. In stance, 180  turn to R 0     MSQ = Total score  (# of positions) / 20.48 MSQ = __________________  0-10 mild; 11-30 moderate; 31-100 severe    Vestibular Treatment  TODAY'S TREATMENT:                                                                                                                              N/a eval     PATIENT EDUCATION: Education details: PT POC, exam findings, not taking meclizine before next visit Person educated: Patient Education method: Explanation Education comprehension: verbalized understanding  HOME EXERCISE PROGRAM: To be provided if necessary   GOALS: Goals reviewed with patient? Yes  SHORT TERM GOALS: = LTG based on POC length   LONG TERM GOALS: Target date: 01/21/23  Pt will be independent with final HEP  for improved dizziness  Baseline: to be provided Goal status: INITIAL  2.  Patient will demonstrate (-) positional testing to indicate resolution of BPPV  Baseline: to be reassessed without meclizine Goal status: INITIAL  3.  MCTSIB goal  Baseline: to be reassessed Goal status: INITIAL   ASSESSMENT:  CLINICAL IMPRESSION: Patient is a 66 y.o. male who was seen today for physical therapy evaluation and treatment for dizziness. Upon exam, vestibular assessment is normal with no reports of increase in dizziness and no observed nystagmus. Patient did take Meclizine last night and that can likely mask any vestibular symptoms that he may be experiencing. He would benefit from at least 1 more skilled PT session to re-assess vestibular system without Meclizine in his system.   OBJECTIVE IMPAIRMENTS: decreased knowledge of condition and dizziness.   ACTIVITY LIMITATIONS: bed mobility, locomotion level, and caring for others  PARTICIPATION LIMITATIONS: interpersonal relationship, driving, shopping, community activity, and occupation  PERSONAL FACTORS: Behavior pattern, Past/current experiences, Time since onset of  injury/illness/exacerbation, and 3+ comorbidities: HTN, DM 2, CA, PVC ablation  are also affecting patient's functional outcome.   REHAB POTENTIAL: Good  CLINICAL DECISION MAKING: Stable/uncomplicated  EVALUATION COMPLEXITY: Low  PLAN:  PT FREQUENCY: 1x/week  PT DURATION: 4 weeks  PLANNED INTERVENTIONS: Therapeutic exercises, Therapeutic activity, Neuromuscular re-education, Balance training, Gait training, Patient/Family education, Self Care, Joint mobilization, Vestibular training, Canalith repositioning, Visual/preceptual remediation/compensation, Manual therapy, and Re-evaluation  PLAN FOR NEXT SESSION: re-assess vestib, M-CTSIB, HEP if needed   Debbora Dus, PT Debbora Dus, PT, DPT, CBIS  12/24/2022, 3:30 PM

## 2022-12-25 DIAGNOSIS — I10 Essential (primary) hypertension: Secondary | ICD-10-CM | POA: Diagnosis not present

## 2022-12-25 DIAGNOSIS — R55 Syncope and collapse: Secondary | ICD-10-CM | POA: Diagnosis not present

## 2022-12-25 DIAGNOSIS — S2239XA Fracture of one rib, unspecified side, initial encounter for closed fracture: Secondary | ICD-10-CM | POA: Diagnosis not present

## 2023-01-01 ENCOUNTER — Ambulatory Visit: Payer: Medicare HMO | Attending: Internal Medicine

## 2023-01-01 DIAGNOSIS — R42 Dizziness and giddiness: Secondary | ICD-10-CM | POA: Diagnosis not present

## 2023-01-01 DIAGNOSIS — R2689 Other abnormalities of gait and mobility: Secondary | ICD-10-CM | POA: Insufficient documentation

## 2023-01-01 DIAGNOSIS — M6281 Muscle weakness (generalized): Secondary | ICD-10-CM | POA: Insufficient documentation

## 2023-01-01 NOTE — Therapy (Signed)
OUTPATIENT PHYSICAL THERAPY VESTIBULAR TREATMENT   Patient Name: Phillip Tate MRN: [id number] DOB:06/03/1956, 67 y.o., male Today's Date: 01/01/2023   PCP: Wenda Low, MD REFERRING PROVIDER: Wenda Low, MD  END OF SESSION:  PT End of Session - 01/01/23 1441     Visit Number 2    Number of Visits 5    Date for PT Re-Evaluation 01/21/23    Authorization Type [employer]    PT Start Time 7209    PT Stop Time 4709   BPPV tx   PT Time Calculation (min) 30 min    Activity Tolerance Patient tolerated treatment well    Behavior During Therapy WFL for tasks assessed/performed             Past Medical History:  Diagnosis Date   Coronary artery disease    DDD (degenerative disc disease), lumbar    Diabetes mellitus without complication ([place])    Dyslipidemia    History of rectal cancer    dx 2006 --  Stage III, T3  N1  M0,  s/p  chemoradiation (07-10-2005 to 09-05-2005) and anterior colon resection 11-04-2006   History of shingles    Hyperlipidemia    Hypertension    Iron deficiency anemia    intolerent PO iron--  gets IV iron,  last one 03-12-2015 ([facility])   Organic impotence    Rectal cancer ([place])    adenocarcinoma stage lll   Past Surgical History:  Procedure Laterality Date   ANAL RECTAL MANOMETRY N/A 09/04/2021   Procedure: ANO RECTAL MANOMETRY;  Surgeon: Mauri Pole, MD;  [facility];  Service: Endoscopy;  Laterality: N/A;   ANTERIOR COLON RESECTION W/ COLOSTOMY  11-04-2006   CATARACT EXTRACTION W/ INTRAOCULAR LENS IMPLANT Right sept 2015  &  feb 2016   COLONOSCOPY WITH ESOPHAGOGASTRODUODENOSCOPY (EGD)  last one 03-07-2010   COLONOSCOPY WITH PROPOFOL N/A 10/23/2015   Procedure: COLONOSCOPY WITH PROPOFOL;  Surgeon: Garlan Fair, MD;  [facility];  Service: Endoscopy;  Laterality: N/A;   COLOSTOMY TAKEDOWN  june 2007   EXAMINATION UNDER ANESTHESIA N/A 04/23/2015   Procedure: EXAM UNDER ANESTHESIA, MANIPULATION OF  PENILE PROSTHESIS PUMP;  Surgeon: Kathie Rhodes, MD;  Location: [facility];  Service: Urology;  Laterality: N/A;   PENILE PROSTHESIS PLACEMENT  10-29-2009   PORT A CATH PLACEMENT  07-23-2005   prolapsed hemorrhoid     Patient Active Problem List   Diagnosis Date Noted   Syncope 12/09/2022   Rib fracture 12/09/2022   Influenza 12/09/2022   Nonhealing nonsurgical wound 12/01/2022   Incisional hernia, without obstruction or gangrene    Irritant contact dermatitis associated with fecal stoma    Colostomy complication ([facility])    Frequent PVCs 05/07/2022   Tinnitus of left ear 05/23/2021   Sleep disorder 05/23/2021   Paresthesia 05/23/2021   Neuropathy 05/23/2021   Dysphagia 05/23/2021   Allergic rhinitis 05/23/2021   Rectal pain 03/05/2021   Pure hypercholesterolemia 03/05/2021   Polyneuropathy due to type 2 diabetes mellitus ([place]) 03/05/2021   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   Long term (current) use of insulin ([place]) 62/83/6629   Lichen planus 47/65/4650   Irregular bowel habits 03/05/2021   Iron deficiency anemia 03/05/2021   Incontinence of feces 03/05/2021   Hyperglycemia due to type 2 diabetes mellitus ([place]) 03/05/2021   History of rectal cancer with ostomy  03/05/2021   History of iron deficiency 03/05/2021   Gastroesophageal reflux disease 03/05/2021   Diabetic retinopathy associated with type 2  diabetes mellitus ([place]) 03/05/2021   Diabetic renal disease ([place]) 03/05/2021   Constipation 03/05/2021   Chronic kidney disease, stage 1 03/05/2021   Cervical disc disease 03/05/2021   Rectal mucosa prolapse 10/26/2018   Essential hypertension 09/18/2016   Hyperlipidemia 09/18/2016   Diabetes mellitus without complication ([place]) 84/69/6295   Pityriasis lichenoides 28/41/3244    ONSET DATE: 12/08/2022 referral   REFERRING DIAG:  R26.9 (ICD-10-CM) - Unspecified abnormalities of gait and mobility    THERAPY DIAG:  Other abnormalities of gait and mobility  Muscle  weakness (generalized)  Dizziness and giddiness  Rationale for Evaluation and Treatment: Rehabilitation  SUBJECTIVE:                                                                                                                                                                                             SUBJECTIVE STATEMENT: Patient reports doing well. Reports BP has been stable in the 130s/80s since he stopped/reduced the Coreg. Has not taken Meclizine since last visit. Reports some dizziness with quick sit > stand or head turns. Does report some spinning with turning to the L in bed. This doesn't occur every time he rolls L, and does occur sometimes when he rolls R.  Pt accompanied by: self  PERTINENT HISTORY: essential hypertension, diabetes mellitus type 2 iron deficiency anemia, rectal cancer s/p resection PVC ablation in September 2023, revision of colostomy and repair of parastomal hernia October 2023 recently diagnosed with influenza  PAIN:  Are you having pain? Yes: NPRS scale: 4/10 Pain location: B ribs due to fx  Pain description: achy   PRECAUTIONS: Fall and Other: colostomy   PATIENT GOALS: "get rid of the dizziness"   Vestibular Asssessment    Positional Testing: Right Dix-Hallpike: no nystagmus Left Dix-Hallpike: upbeating, left nystagmus Right Roll Test: no nystagmus Left Roll Test: no nystagmus   Vestibular Treatment   Canalith Repositioning:   Epley Left: Number of Reps: 1, Response to Treatment: symptoms resolved, and Comment: Patient with dizziness and a few beats of nystagmus in 2nd position, no dizziness reported in 3rd or 4th         PATIENT EDUCATION: Education details: PT POC, exam findings, self-epley Person educated: Patient Education method: Explanation Education comprehension: verbalized understanding  HOME EXERCISE PROGRAM: How to Perform the Epley Maneuver The Epley maneuver is an exercise that relieves symptoms of vertigo. Vertigo is the  feeling that you or your surroundings are moving when they are not. When you feel vertigo, you may feel like the room is spinning and may have trouble walking. The Epley maneuver is used for a type of vertigo caused  by a calcium deposit in a part of the inner ear. The maneuver involves changing head positions to help the deposit move out of the area. You can do this maneuver at home whenever you have symptoms of vertigo. You can repeat it in 24 hours if your vertigo has not gone away. Even though the Epley maneuver may relieve your vertigo for a few weeks, it is possible that your symptoms will return. This maneuver relieves vertigo, but it does not relieve dizziness. What are the risks? If it is done correctly, the Epley maneuver is considered safe. Sometimes it can lead to dizziness or nausea that goes away after a short time. If you develop other symptoms--such as changes in vision, weakness, or numbness--stop doing the maneuver and call your health care provider. Supplies needed: A bed or table. A pillow. How to do the Epley maneuver     Sit on the edge of a bed or table with your back straight and your legs extended or hanging over the edge of the bed or table. Turn your head halfway toward the affected ear or side as told by your health care provider. Lie backward quickly with your head turned until you are lying flat on your back. Your head should dangle (head-hanging position). You may want to position a pillow under your shoulders. Hold this position for at least 30 seconds. If you feel dizzy or have symptoms of vertigo, continue to hold the position until the symptoms stop. Turn your head to the opposite direction until your unaffected ear is facing down. Your head should continue to dangle. Hold this position for at least 30 seconds. If you feel dizzy or have symptoms of vertigo, continue to hold the position until the symptoms stop. Turn your whole body to the same side as your head so  that you are positioned on your side. Your head will now be nearly facedown and no longer needs to dangle. Hold for at least 30 seconds. If you feel dizzy or have symptoms of vertigo, continue to hold the position until the symptoms stop. Sit back up. You can repeat the maneuver in 24 hours if your vertigo does not go away. Follow these instructions at home: For 24 hours after doing the Epley maneuver: Keep your head in an upright position. When lying down to sleep or rest, keep your head raised (elevated) with two or more pillows. Avoid excessive neck movements. Activity Do not drive or use machinery if you feel dizzy. After doing the Epley maneuver, return to your normal activities as told by your health care provider. Ask your health care provider what activities are safe for you. General instructions Drink enough fluid to keep your urine pale yellow. Do not drink alcohol. Take over-the-counter and prescription medicines only as told by your health care provider. Keep all follow-up visits. This is important. Preventing vertigo symptoms Ask your health care provider if there is anything you should do at home to prevent vertigo. He or she may recommend that you: Keep your head elevated with two or more pillows while you sleep. Do not sleep on the side of your affected ear. Get up slowly from bed. Avoid sudden movements during the day. Avoid extreme head positions or movement, such as looking up or bending over. Contact a health care provider if: Your vertigo gets worse. You have other symptoms, including: Nausea. Vomiting. Headache. Get help right away if you: Have vision changes. Have a headache or neck pain that is severe or  getting worse. Cannot stop vomiting. Have new numbness or weakness in any part of your body. These symptoms may represent a serious problem that is an emergency. Do not wait to see if the symptoms will go away. Get medical help right away. Call your local  emergency services (911 in the [place] Do not drive yourself to the hospital. Summary Vertigo is the feeling that you or your surroundings are moving when they are not. The Epley maneuver is an exercise that relieves symptoms of vertigo. If the Epley maneuver is done correctly, it is considered safe. This information is not intended to replace advice given to you by your health care provider. Make sure you discuss any questions you have with your health care provider.    GOALS: Goals reviewed with patient? Yes  SHORT TERM GOALS: = LTG based on POC length   LONG TERM GOALS: Target date: 01/21/23  Pt will be independent with final HEP for improved dizziness  Baseline: to be provided Goal status: INITIAL  2.  Patient will demonstrate (-) positional testing to indicate resolution of BPPV  Baseline: to be reassessed without meclizine Goal status: INITIAL  3.  MCTSIB goal  Baseline: to be reassessed Goal status: INITIAL   ASSESSMENT:  CLINICAL IMPRESSION: Patient seen for skilled PT session with emphasis on re-assessing positional testing now that patient has not taken meclizine. He had positive L dix hallpike with ~15s of L torsional upbeating nystagmus. Treated with Epley x1 and symptoms resolved with re-testing. Discussed rehab prognosis with patient and answered questions regarding risk factors for reoccurrence. Continue POC.   OBJECTIVE IMPAIRMENTS: decreased knowledge of condition and dizziness.   ACTIVITY LIMITATIONS: bed mobility, locomotion level, and caring for others  PARTICIPATION LIMITATIONS: interpersonal relationship, driving, shopping, community activity, and occupation  PERSONAL FACTORS: Behavior pattern, Past/current experiences, Time since onset of injury/illness/exacerbation, and 3+ comorbidities: HTN, DM 2, CA, PVC ablation  are also affecting patient's functional outcome.   REHAB POTENTIAL: Good  CLINICAL DECISION MAKING: Stable/uncomplicated  EVALUATION  COMPLEXITY: Low  PLAN:  PT FREQUENCY: 1x/week  PT DURATION: 4 weeks  PLANNED INTERVENTIONS: Therapeutic exercises, Therapeutic activity, Neuromuscular re-education, Balance training, Gait training, Patient/Family education, Self Care, Joint mobilization, Vestibular training, Canalith repositioning, Visual/preceptual remediation/compensation, Manual therapy, and Re-evaluation  PLAN FOR NEXT SESSION: re-assess [place], PT Debbora Dus, PT, DPT, CBIS  01/01/2023, 3:22 PM

## 2023-01-07 ENCOUNTER — Ambulatory Visit: Payer: Medicare HMO

## 2023-01-07 DIAGNOSIS — R42 Dizziness and giddiness: Secondary | ICD-10-CM

## 2023-01-07 DIAGNOSIS — R2689 Other abnormalities of gait and mobility: Secondary | ICD-10-CM | POA: Diagnosis not present

## 2023-01-07 DIAGNOSIS — M6281 Muscle weakness (generalized): Secondary | ICD-10-CM

## 2023-01-07 NOTE — Therapy (Signed)
OUTPATIENT PHYSICAL THERAPY VESTIBULAR TREATMENT   Patient Name: Phillip Tate MRN: [id number] DOB:05-10-1956, 67 y.o., male Today's Date: 01/07/2023   PCP: Wenda Low, MD REFERRING PROVIDER: Wenda Low, MD  END OF SESSION:  PT End of Session - 01/07/23 1234     Visit Number 3    Number of Visits 5    Date for PT Re-Evaluation 01/21/23    Authorization Type [employer]    PT Start Time 1233    PT Stop Time 1249   BPPV tx   PT Time Calculation (min) 16 min    Activity Tolerance Patient tolerated treatment well    Behavior During Therapy WFL for tasks assessed/performed             Past Medical History:  Diagnosis Date   Coronary artery disease    DDD (degenerative disc disease), lumbar    Diabetes mellitus without complication ([place])    Dyslipidemia    History of rectal cancer    dx 2006 --  Stage III, T3  N1  M0,  s/p  chemoradiation (07-10-2005 to 09-05-2005) and anterior colon resection 11-04-2006   History of shingles    Hyperlipidemia    Hypertension    Iron deficiency anemia    intolerent PO iron--  gets IV iron,  last one 03-12-2015 ([facility])   Organic impotence    Rectal cancer ([place])    adenocarcinoma stage lll   Past Surgical History:  Procedure Laterality Date   ANAL RECTAL MANOMETRY N/A 09/04/2021   Procedure: ANO RECTAL MANOMETRY;  Surgeon: Mauri Pole, MD;  Location: [facility];  Service: Endoscopy;  Laterality: N/A;   ANTERIOR COLON RESECTION W/ COLOSTOMY  11-04-2006   CATARACT EXTRACTION W/ INTRAOCULAR LENS IMPLANT Right sept 2015  &  feb 2016   COLONOSCOPY WITH ESOPHAGOGASTRODUODENOSCOPY (EGD)  last one 03-07-2010   COLONOSCOPY WITH PROPOFOL N/A 10/23/2015   Procedure: COLONOSCOPY WITH PROPOFOL;  Surgeon: Garlan Fair, MD;  Location: [facility];  Service: Endoscopy;  Laterality: N/A;   COLOSTOMY TAKEDOWN  june 2007   EXAMINATION UNDER ANESTHESIA N/A 04/23/2015   Procedure: EXAM UNDER ANESTHESIA, MANIPULATION OF  PENILE PROSTHESIS PUMP;  Surgeon: Kathie Rhodes, MD;  Location: [facility];  Service: Urology;  Laterality: N/A;   PENILE PROSTHESIS PLACEMENT  10-29-2009   PORT A CATH PLACEMENT  07-23-2005   prolapsed hemorrhoid     Patient Active Problem List   Diagnosis Date Noted   Syncope 12/09/2022   Rib fracture 12/09/2022   Influenza 12/09/2022   Nonhealing nonsurgical wound 12/01/2022   Incisional hernia, without obstruction or gangrene    Irritant contact dermatitis associated with fecal stoma    Colostomy complication (HCC)    Frequent PVCs 05/07/2022   Tinnitus of left ear 05/23/2021   Sleep disorder 05/23/2021   Paresthesia 05/23/2021   Neuropathy 05/23/2021   Dysphagia 05/23/2021   Allergic rhinitis 05/23/2021   Rectal pain 03/05/2021   Pure hypercholesterolemia 03/05/2021   Polyneuropathy due to type 2 diabetes mellitus ([place]) 03/05/2021   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   Long term (current) use of insulin ([place]) 64/33/2951   Lichen planus 88/41/6606   Irregular bowel habits 03/05/2021   Iron deficiency anemia 03/05/2021   Incontinence of feces 03/05/2021   Hyperglycemia due to type 2 diabetes mellitus ([place]) 03/05/2021   History of rectal cancer with ostomy  03/05/2021   History of iron deficiency 03/05/2021   Gastroesophageal reflux disease 03/05/2021   Diabetic retinopathy associated with type 2  diabetes mellitus ([place]) 03/05/2021   Diabetic renal disease ([place]) 03/05/2021   Constipation 03/05/2021   Chronic kidney disease, stage 1 03/05/2021   Cervical disc disease 03/05/2021   Rectal mucosa prolapse 10/26/2018   Essential hypertension 09/18/2016   Hyperlipidemia 09/18/2016   Diabetes mellitus without complication ([place]) 96/29/5284   Pityriasis lichenoides 13/24/4010    ONSET DATE: 12/08/2022 referral   REFERRING DIAG:  R26.9 (ICD-10-CM) - Unspecified abnormalities of gait and mobility    THERAPY DIAG:  Other abnormalities of gait and mobility  Muscle  weakness (generalized)  Dizziness and giddiness  Rationale for Evaluation and Treatment: Rehabilitation  SUBJECTIVE:                                                                                                                                                                                             SUBJECTIVE STATEMENT: Patient reports doing well. Reports that he was feeling better, but did do the Epley yesterday and felt dizzy a little bit. States his wife didn't see any eye movement. Denies falls/near fall.  Pt accompanied by: self  PERTINENT HISTORY: essential hypertension, diabetes mellitus type 2 iron deficiency anemia, rectal cancer s/p resection PVC ablation in September 2023, revision of colostomy and repair of parastomal hernia October 2023 recently diagnosed with influenza  PAIN:  Are you having pain? Yes: NPRS scale: 4/10 Pain location: B ribs due to fx  Pain description: achy   PRECAUTIONS: Fall and Other: colostomy   PATIENT GOALS: "get rid of the dizziness"   Vestibular Asssessment    Positional Testing: Right Dix-Hallpike: no nystagmus Left Dix-Hallpike: no nystagmus Right Roll Test: no nystagmus Left Roll Test: no nystagmus Treatment bed put into trendelenburg and patient still without nystagmus or dizziness   Vestibular Treatment None warranted as patient did not present with nystagmus or dizziness  PATIENT EDUCATION: Education details: PT POC, exam findings, self-epley PRN Person educated: Patient Education method: Explanation Education comprehension: verbalized understanding  HOME EXERCISE PROGRAM: How to Perform the Epley Maneuver The Epley maneuver is an exercise that relieves symptoms of vertigo. Vertigo is the feeling that you or your surroundings are moving when they are not. When you feel vertigo, you may feel like the room is spinning and may have trouble walking. The Epley maneuver is used for a type of vertigo caused by a calcium deposit in  a part of the inner ear. The maneuver involves changing head positions to help the deposit move out of the area. You can do this maneuver at home whenever you have symptoms of vertigo. You can repeat it in 24 hours if your vertigo has not  gone away. Even though the Epley maneuver may relieve your vertigo for a few weeks, it is possible that your symptoms will return. This maneuver relieves vertigo, but it does not relieve dizziness. What are the risks? If it is done correctly, the Epley maneuver is considered safe. Sometimes it can lead to dizziness or nausea that goes away after a short time. If you develop other symptoms--such as changes in vision, weakness, or numbness--stop doing the maneuver and call your health care provider. Supplies needed: A bed or table. A pillow. How to do the Epley maneuver     Sit on the edge of a bed or table with your back straight and your legs extended or hanging over the edge of the bed or table. Turn your head halfway toward the affected ear or side as told by your health care provider. Lie backward quickly with your head turned until you are lying flat on your back. Your head should dangle (head-hanging position). You may want to position a pillow under your shoulders. Hold this position for at least 30 seconds. If you feel dizzy or have symptoms of vertigo, continue to hold the position until the symptoms stop. Turn your head to the opposite direction until your unaffected ear is facing down. Your head should continue to dangle. Hold this position for at least 30 seconds. If you feel dizzy or have symptoms of vertigo, continue to hold the position until the symptoms stop. Turn your whole body to the same side as your head so that you are positioned on your side. Your head will now be nearly facedown and no longer needs to dangle. Hold for at least 30 seconds. If you feel dizzy or have symptoms of vertigo, continue to hold the position until the symptoms  stop. Sit back up. You can repeat the maneuver in 24 hours if your vertigo does not go away. Follow these instructions at home: For 24 hours after doing the Epley maneuver: Keep your head in an upright position. When lying down to sleep or rest, keep your head raised (elevated) with two or more pillows. Avoid excessive neck movements. Activity Do not drive or use machinery if you feel dizzy. After doing the Epley maneuver, return to your normal activities as told by your health care provider. Ask your health care provider what activities are safe for you. General instructions Drink enough fluid to keep your urine pale yellow. Do not drink alcohol. Take over-the-counter and prescription medicines only as told by your health care provider. Keep all follow-up visits. This is important. Preventing vertigo symptoms Ask your health care provider if there is anything you should do at home to prevent vertigo. He or she may recommend that you: Keep your head elevated with two or more pillows while you sleep. Do not sleep on the side of your affected ear. Get up slowly from bed. Avoid sudden movements during the day. Avoid extreme head positions or movement, such as looking up or bending over. Contact a health care provider if: Your vertigo gets worse. You have other symptoms, including: Nausea. Vomiting. Headache. Get help right away if you: Have vision changes. Have a headache or neck pain that is severe or getting worse. Cannot stop vomiting. Have new numbness or weakness in any part of your body. These symptoms may represent a serious problem that is an emergency. Do not wait to see if the symptoms will go away. Get medical help right away. Call your local emergency services (911 in the  [place] Do not drive yourself to the hospital. Summary Vertigo is the feeling that you or your surroundings are moving when they are not. The Epley maneuver is an exercise that relieves symptoms of  vertigo. If the Epley maneuver is done correctly, it is considered safe. This information is not intended to replace advice given to you by your health care provider. Make sure you discuss any questions you have with your health care provider.    GOALS: Goals reviewed with patient? Yes  SHORT TERM GOALS: = LTG based on POC length   LONG TERM GOALS: Target date: 01/21/23  Pt will be independent with final HEP for improved dizziness  Baseline: to be provided Goal status: INITIAL  2.  Patient will demonstrate (-) positional testing to indicate resolution of BPPV  Baseline: to be reassessed without meclizine Goal status: INITIAL  3.  MCTSIB goal  Baseline: to be reassessed Goal status: INITIAL   ASSESSMENT:  CLINICAL IMPRESSION: Patient seen for skilled PT session with emphasis on re-assessing L dix hallpike to check for resolution of L posterior canal canalithiasis. Patient reporting a very short instance of spinning vertigo yesterday AM and did the self-epley at home with good results. No nystagmus produced on exam today, even with bed in trendelenburg. Discussed with patient that he should only do the self-epley if he experiences spinning vertigo. Patient verbalized understanding. Patients POC to remain open for the remainder of the cert period and patient may call to make an appt should he experience BPPV again. Continue POC PRN.   OBJECTIVE IMPAIRMENTS: decreased knowledge of condition and dizziness.   ACTIVITY LIMITATIONS: bed mobility, locomotion level, and caring for others  PARTICIPATION LIMITATIONS: interpersonal relationship, driving, shopping, community activity, and occupation  PERSONAL FACTORS: Behavior pattern, Past/current experiences, Time since onset of injury/illness/exacerbation, and 3+ comorbidities: HTN, DM 2, CA, PVC ablation  are also affecting patient's functional outcome.   REHAB POTENTIAL: Good  CLINICAL DECISION MAKING:  Stable/uncomplicated  EVALUATION COMPLEXITY: Low  PLAN:  PT FREQUENCY: 1x/week  PT DURATION: 4 weeks  PLANNED INTERVENTIONS: Therapeutic exercises, Therapeutic activity, Neuromuscular re-education, Balance training, Gait training, Patient/Family education, Self Care, Joint mobilization, Vestibular training, Canalith repositioning, Visual/preceptual remediation/compensation, Manual therapy, and Re-evaluation  PLAN FOR NEXT SESSION: re-assess [place], PT Debbora Dus, PT, DPT, CBIS  01/07/2023, 12:58 PM

## 2023-01-12 ENCOUNTER — Ambulatory Visit (HOSPITAL_COMMUNITY)
Admission: RE | Admit: 2023-01-12 | Discharge: 2023-01-12 | Disposition: A | Discharge status: Home / Self Care | Payer: Medicare HMO | Source: Ambulatory Visit | Attending: Nurse Practitioner | Admitting: Nurse Practitioner

## 2023-01-12 DIAGNOSIS — Z433 Encounter for attention to colostomy: Secondary | ICD-10-CM | POA: Diagnosis not present

## 2023-01-12 DIAGNOSIS — L24B3 Irritant contact dermatitis related to fecal or urinary stoma or fistula: Secondary | ICD-10-CM | POA: Diagnosis not present

## 2023-01-12 DIAGNOSIS — K9400 Colostomy complication, unspecified: Secondary | ICD-10-CM

## 2023-01-12 DIAGNOSIS — Z933 Colostomy status: Secondary | ICD-10-CM | POA: Diagnosis present

## 2023-01-12 NOTE — Progress Notes (Signed)
[facility]   Reason for visit:  LLQ colostomy  peristomal breakdown has reopened Recent fall with rib FX.  HPI:   Past Medical History:  Diagnosis Date  . Coronary artery disease   . DDD (degenerative disc disease), lumbar   . Diabetes mellitus without complication ([place])   . Dyslipidemia   . History of rectal cancer    dx 2006 --  Stage III, T3  N1  M0,  s/p  chemoradiation (07-10-2005 to 09-05-2005) and anterior colon resection 11-04-2006  . History of shingles   . Hyperlipidemia   . Hypertension   . Iron deficiency anemia    intolerent PO iron--  gets IV iron,  last one 03-12-2015 ([facility])  . Organic impotence   . Rectal cancer (HCC)    adenocarcinoma stage lll   Family History  Problem Relation Age of Onset  . Hypertension Father   . Diabetes Mother   . Heart attack Neg Hx   . Colon cancer Neg Hx   . Esophageal cancer Neg Hx   . Rectal cancer Neg Hx   . Stomach cancer Neg Hx    Allergies  Allergen Reactions  . Cardizem Cd [Diltiazem Hcl Er Beads] Swelling  . Toprol Xl [Metoprolol] Other (See Comments)    headache  . Tricor [Fenofibrate] Other (See Comments)    Constipation    Current Outpatient Medications  Medication Sig Dispense Refill Last Dose  . acetaminophen (TYLENOL) 500 MG tablet Take 500 mg by mouth every 6 (six) hours as needed for mild pain.     Marland Kitchen amLODipine (NORVASC) 10 MG tablet Take 1 tablet (10 mg total) by mouth daily.     . carvedilol (COREG) 12.5 MG tablet Take 12.5 mg by mouth 2 (two) times daily with a meal.     . empagliflozin (JARDIANCE) 25 MG TABS tablet Take 25 mg by mouth daily.     . Ferrous Sulfate (IRON PO) Take 1 tablet by mouth daily.     Marland Kitchen gabapentin (NEURONTIN) 300 MG capsule Take 300 mg by mouth 2 (two) times daily.  0   . ibuprofen (ADVIL) 200 MG tablet Take 200 mg by mouth every 6 (six) hours as needed for mild pain.     Marland Kitchen insulin glargine (LANTUS) 100 UNIT/ML injection Inject 36 Units into the skin at bedtime.      Marland Kitchen lactulose (CEPHULAC) 10 g packet Take 10 g by mouth every other day. (Patient not taking: Reported on 12/09/2022)     . [START ON 01/22/2023] lisinopril (ZESTRIL) 40 MG tablet Take 1 tablet (40 mg total) by mouth daily.     . mesalamine (LIALDA) 1.2 g EC tablet Take 2 tablets (2.4 g total) by mouth daily with breakfast. (Patient not taking: Reported on 05/07/2022) 60 tablet 1   . metFORMIN (GLUCOPHAGE) 850 MG tablet Take 850 mg by mouth 2 (two) times daily with a meal.     . methocarbamol (ROBAXIN) 500 MG tablet Take 1 and 1/2 tablets (750 mg total) by mouth every 6 (six) hours as needed for muscle spasms. 30 tablet 0   . NOVOLOG FLEXPEN 100 UNIT/ML FlexPen Inject 13-45 Units into the skin See admin instructions. Take 1 to 18 units at breakfast, 25 to 30 units at lunch, and 40 to 45 units at evening  2   . pantoprazole (PROTONIX) 40 MG tablet TAKE 1 TABLET TWICE DAILY (Patient taking differently: Take 40 mg by mouth daily.) 180 tablet 3   . Polyethyl  Glycol-Propyl Glycol (SYSTANE FREE OP) Place 1 drop into both eyes in the morning and at bedtime.     . rosuvastatin (CRESTOR) 10 MG tablet Take 1 tablet (10 mg total) by mouth daily. (Patient taking differently: Take 5 mg by mouth every evening.) 90 tablet 3   . zolpidem (AMBIEN) 10 MG tablet Take 10 mg by mouth at bedtime as needed for sleep.      No current facility-administered medications for this encounter.   ROS  Review of Systems  Gastrointestinal:        LLQ colostomy  Musculoskeletal:        Recent fall with rib Fracture  Skin:  Positive for wound.       Peristomal breakdown  Neurological:  Positive for dizziness and light-headedness.       Has had some vertigo, sinus congestion and weakness.  Recent fall with hematoma to head  Psychiatric/Behavioral: Negative.    Vital signs:  There were no vitals taken for this visit. Exam:  Physical Exam Constitutional:      Appearance: Normal appearance.  HENT:     Mouth/Throat:     Mouth:  Mucous membranes are moist.  Cardiovascular:     Rate and Rhythm: Normal rate.     Pulses: Normal pulses.     Heart sounds: Normal heart sounds.  Abdominal:     Palpations: Abdomen is soft.     Comments: LLQ colostomy   Musculoskeletal:     Comments: Recent fall with injury  Skin:    General: Skin is warm and dry.     Findings: Lesion present.  Neurological:     General: No focal deficit present.     Mental Status: He is alert and oriented to person, place, and time.  Psychiatric:        Mood and Affect: Mood normal.        Behavior: Behavior normal.    Stoma type/location:  LLQ colostomy Stomal assessment/size:  1 3/8" pink moist and budded Peristomal assessment:  nonintact lesion at 6 o'clock.  We previously healed this area, it has reopened   will resume silver Treatment options for stomal/peristomal skin: silver to open wound, cover with barrier ring, continue soft convex pouch and belt Output: soft brown stool Ostomy pouching: 1pc. Coloplast pouch with barrier ring  Education provided:  demonstrated wound care once again (they are familiar)   Additional supplies given    Impression/dx  Peristomal breakdown Discussion  See back as needed.  Plan  See back as needed.     Visit time: 40 minutes.   Domenic Moras FNP-BC

## 2023-01-13 ENCOUNTER — Other Ambulatory Visit: Payer: Self-pay | Admitting: Internal Medicine

## 2023-01-13 ENCOUNTER — Ambulatory Visit
Admission: RE | Admit: 2023-01-13 | Discharge: 2023-01-13 | Disposition: A | Discharge status: Home / Self Care | Payer: Medicare HMO | Source: Ambulatory Visit | Attending: Internal Medicine | Admitting: Internal Medicine

## 2023-01-13 DIAGNOSIS — Z03818 Encounter for observation for suspected exposure to other biological agents ruled out: Secondary | ICD-10-CM | POA: Diagnosis not present

## 2023-01-13 DIAGNOSIS — R058 Other specified cough: Secondary | ICD-10-CM

## 2023-01-13 DIAGNOSIS — J329 Chronic sinusitis, unspecified: Secondary | ICD-10-CM | POA: Diagnosis not present

## 2023-01-13 DIAGNOSIS — R059 Cough, unspecified: Secondary | ICD-10-CM | POA: Diagnosis not present

## 2023-01-13 DIAGNOSIS — R0602 Shortness of breath: Secondary | ICD-10-CM | POA: Diagnosis not present

## 2023-01-15 DIAGNOSIS — Z933 Colostomy status: Secondary | ICD-10-CM | POA: Diagnosis not present

## 2023-01-16 DIAGNOSIS — Z933 Colostomy status: Secondary | ICD-10-CM | POA: Diagnosis not present

## 2023-01-16 NOTE — Discharge Instructions (Signed)
Resume aquacel to open wound. Cover with barrier ring

## 2023-01-22 DIAGNOSIS — I25118 Atherosclerotic heart disease of native coronary artery with other forms of angina pectoris: Secondary | ICD-10-CM | POA: Diagnosis not present

## 2023-01-22 DIAGNOSIS — I10 Essential (primary) hypertension: Secondary | ICD-10-CM | POA: Diagnosis not present

## 2023-01-22 DIAGNOSIS — I493 Ventricular premature depolarization: Secondary | ICD-10-CM | POA: Diagnosis not present

## 2023-02-16 ENCOUNTER — Ambulatory Visit (HOSPITAL_COMMUNITY)
Admission: RE | Admit: 2023-02-16 | Discharge: 2023-02-16 | Disposition: A | Discharge status: Home / Self Care | Payer: Medicare HMO | Source: Ambulatory Visit | Attending: Nurse Practitioner | Admitting: Nurse Practitioner

## 2023-02-16 DIAGNOSIS — K9400 Colostomy complication, unspecified: Secondary | ICD-10-CM | POA: Diagnosis not present

## 2023-02-16 DIAGNOSIS — Z433 Encounter for attention to colostomy: Secondary | ICD-10-CM | POA: Diagnosis not present

## 2023-02-16 DIAGNOSIS — Z933 Colostomy status: Secondary | ICD-10-CM | POA: Diagnosis not present

## 2023-02-16 NOTE — Progress Notes (Signed)
[facility]   Reason for visit:  LLQ colostomy HPI:  Colostomy  Recent fall with injur Past Medical History:  Diagnosis Date   Coronary artery disease    DDD (degenerative disc disease), lumbar    Diabetes mellitus without complication ([place])    Dyslipidemia    History of rectal cancer    dx 2006 --  Stage III, T3  N1  M0,  s/p  chemoradiation (07-10-2005 to 09-05-2005) and anterior colon resection 11-04-2006   History of shingles    Hyperlipidemia    Hypertension    Iron deficiency anemia    intolerent PO iron--  gets IV iron,  last one 03-12-2015 ([facility])   Organic impotence    Rectal cancer (HCC)    adenocarcinoma stage lll   Family History  Problem Relation Age of Onset   Hypertension Father    Diabetes Mother    Heart attack Neg Hx    Colon cancer Neg Hx    Esophageal cancer Neg Hx    Rectal cancer Neg Hx    Stomach cancer Neg Hx    Allergies  Allergen Reactions   Cardizem Cd [Diltiazem Hcl Er Beads] Swelling   Toprol Xl [Metoprolol] Other (See Comments)    headache   Tricor [Fenofibrate] Other (See Comments)    Constipation    Current Outpatient Medications  Medication Sig Dispense Refill Last Dose   acetaminophen (TYLENOL) 500 MG tablet Take 500 mg by mouth every 6 (six) hours as needed for mild pain.      amLODipine (NORVASC) 10 MG tablet Take 1 tablet (10 mg total) by mouth daily.      carvedilol (COREG) 12.5 MG tablet Take 12.5 mg by mouth 2 (two) times daily with a meal.      empagliflozin (JARDIANCE) 25 MG TABS tablet Take 25 mg by mouth daily.      Ferrous Sulfate (IRON PO) Take 1 tablet by mouth daily.      gabapentin (NEURONTIN) 300 MG capsule Take 300 mg by mouth 2 (two) times daily.  0    ibuprofen (ADVIL) 200 MG tablet Take 200 mg by mouth every 6 (six) hours as needed for mild pain.      insulin glargine (LANTUS) 100 UNIT/ML injection Inject 36 Units into the skin at bedtime.      lactulose (CEPHULAC) 10 g packet Take 10 g by mouth  every other day. (Patient not taking: Reported on 12/09/2022)      lisinopril (ZESTRIL) 40 MG tablet Take 1 tablet (40 mg total) by mouth daily.      mesalamine (LIALDA) 1.2 g EC tablet Take 2 tablets (2.4 g total) by mouth daily with breakfast. (Patient not taking: Reported on 05/07/2022) 60 tablet 1    metFORMIN (GLUCOPHAGE) 850 MG tablet Take 850 mg by mouth 2 (two) times daily with a meal.      methocarbamol (ROBAXIN) 500 MG tablet Take 1 and 1/2 tablets (750 mg total) by mouth every 6 (six) hours as needed for muscle spasms. 30 tablet 0    NOVOLOG FLEXPEN 100 UNIT/ML FlexPen Inject 13-45 Units into the skin See admin instructions. Take 1 to 18 units at breakfast, 25 to 30 units at lunch, and 40 to 45 units at evening  2    pantoprazole (PROTONIX) 40 MG tablet TAKE 1 TABLET TWICE DAILY (Patient taking differently: Take 40 mg by mouth daily.) 180 tablet 3    Polyethyl Glycol-Propyl Glycol (SYSTANE FREE OP) Place 1 drop into  both eyes in the morning and at bedtime.      rosuvastatin (CRESTOR) 10 MG tablet Take 1 tablet (10 mg total) by mouth daily. (Patient taking differently: Take 5 mg by mouth every evening.) 90 tablet 3    zolpidem (AMBIEN) 10 MG tablet Take 10 mg by mouth at bedtime as needed for sleep.      No current facility-administered medications for this encounter.   ROS  Review of Systems  Gastrointestinal:        LLQ colostomy  Skin:  Positive for color change.       Hematoma to left forehead from recent has healed but remains dark and discolored.  Educated to wear sunscreen and try vitamin e oil.   Psychiatric/Behavioral: Negative.    All other systems reviewed and are negative.  Vital signs:  BP (!) 140/66 (BP Location: Left Arm)   Pulse 78   Temp 97.9 F (36.6 C) (Oral)   Resp 20   SpO2 96%  Exam:  Physical Exam Vitals reviewed.  Constitutional:      Appearance: Normal appearance.  Abdominal:     Palpations: Abdomen is soft.  Skin:    General: Skin is warm and  dry.     Findings: Bruising present.  Neurological:     Mental Status: He is alert and oriented to person, place, and time.  Psychiatric:        Mood and Affect: Mood normal.        Behavior: Behavior normal.     Stoma type/location:  LLQ colostomy Stomal assessment/size:  1 3/8" pink moist and budded Peristomal assessment:  breakdown noted on last visit has resolved Treatment options for stomal/peristomal skin: barrier ring and 1 piece pouch Output: soft brown stool Ostomy pouching: 1pc.coloplast pouch with barrier ring Education provided:  none today    Impression/dx  Colostomy complication Discussion  See back as needed Plan  Continue pouching     Visit time: 35 minutes.   Domenic Moras FNP-BC

## 2023-02-17 DIAGNOSIS — E291 Testicular hypofunction: Secondary | ICD-10-CM | POA: Diagnosis not present

## 2023-02-17 DIAGNOSIS — E1122 Type 2 diabetes mellitus with diabetic chronic kidney disease: Secondary | ICD-10-CM | POA: Diagnosis not present

## 2023-02-17 DIAGNOSIS — M5136 Other intervertebral disc degeneration, lumbar region: Secondary | ICD-10-CM | POA: Diagnosis not present

## 2023-02-17 DIAGNOSIS — I10 Essential (primary) hypertension: Secondary | ICD-10-CM | POA: Diagnosis not present

## 2023-02-17 DIAGNOSIS — Z794 Long term (current) use of insulin: Secondary | ICD-10-CM | POA: Diagnosis not present

## 2023-02-17 DIAGNOSIS — N181 Chronic kidney disease, stage 1: Secondary | ICD-10-CM | POA: Diagnosis not present

## 2023-02-18 NOTE — Discharge Instructions (Signed)
See back as needed

## 2023-02-24 DIAGNOSIS — E1165 Type 2 diabetes mellitus with hyperglycemia: Secondary | ICD-10-CM | POA: Diagnosis not present

## 2023-03-17 DIAGNOSIS — M5416 Radiculopathy, lumbar region: Secondary | ICD-10-CM | POA: Diagnosis not present

## 2023-03-18 DIAGNOSIS — Z933 Colostomy status: Secondary | ICD-10-CM | POA: Diagnosis not present

## 2023-03-18 DIAGNOSIS — H04213 Epiphora due to excess lacrimation, bilateral lacrimal glands: Secondary | ICD-10-CM | POA: Diagnosis not present

## 2023-03-18 DIAGNOSIS — H40013 Open angle with borderline findings, low risk, bilateral: Secondary | ICD-10-CM | POA: Diagnosis not present

## 2023-03-31 DIAGNOSIS — M5416 Radiculopathy, lumbar region: Secondary | ICD-10-CM | POA: Diagnosis not present

## 2023-04-01 ENCOUNTER — Observation Stay (HOSPITAL_COMMUNITY)
Admission: EM | Admit: 2023-04-01 | Discharge: 2023-04-02 | Disposition: A | Discharge status: Home / Self Care | Payer: Medicare HMO | Attending: Internal Medicine | Admitting: Internal Medicine

## 2023-04-01 ENCOUNTER — Emergency Department (HOSPITAL_COMMUNITY): Payer: Medicare HMO

## 2023-04-01 ENCOUNTER — Observation Stay (HOSPITAL_COMMUNITY): Payer: Medicare HMO

## 2023-04-01 ENCOUNTER — Other Ambulatory Visit: Payer: Self-pay

## 2023-04-01 ENCOUNTER — Encounter (HOSPITAL_COMMUNITY): Payer: Self-pay

## 2023-04-01 DIAGNOSIS — I2510 Atherosclerotic heart disease of native coronary artery without angina pectoris: Secondary | ICD-10-CM | POA: Insufficient documentation

## 2023-04-01 DIAGNOSIS — K56600 Partial intestinal obstruction, unspecified as to cause: Secondary | ICD-10-CM | POA: Diagnosis not present

## 2023-04-01 DIAGNOSIS — Z7984 Long term (current) use of oral hypoglycemic drugs: Secondary | ICD-10-CM | POA: Insufficient documentation

## 2023-04-01 DIAGNOSIS — I10 Essential (primary) hypertension: Secondary | ICD-10-CM | POA: Insufficient documentation

## 2023-04-01 DIAGNOSIS — E785 Hyperlipidemia, unspecified: Secondary | ICD-10-CM | POA: Diagnosis present

## 2023-04-01 DIAGNOSIS — Z4659 Encounter for fitting and adjustment of other gastrointestinal appliance and device: Secondary | ICD-10-CM | POA: Diagnosis not present

## 2023-04-01 DIAGNOSIS — Z79899 Other long term (current) drug therapy: Secondary | ICD-10-CM | POA: Insufficient documentation

## 2023-04-01 DIAGNOSIS — R1033 Periumbilical pain: Secondary | ICD-10-CM | POA: Diagnosis present

## 2023-04-01 DIAGNOSIS — K219 Gastro-esophageal reflux disease without esophagitis: Secondary | ICD-10-CM | POA: Diagnosis present

## 2023-04-01 DIAGNOSIS — K56609 Unspecified intestinal obstruction, unspecified as to partial versus complete obstruction: Secondary | ICD-10-CM | POA: Diagnosis present

## 2023-04-01 DIAGNOSIS — Z794 Long term (current) use of insulin: Secondary | ICD-10-CM | POA: Diagnosis not present

## 2023-04-01 DIAGNOSIS — E119 Type 2 diabetes mellitus without complications: Secondary | ICD-10-CM | POA: Diagnosis not present

## 2023-04-01 DIAGNOSIS — R109 Unspecified abdominal pain: Secondary | ICD-10-CM | POA: Diagnosis not present

## 2023-04-01 DIAGNOSIS — Z85048 Personal history of other malignant neoplasm of rectum, rectosigmoid junction, and anus: Secondary | ICD-10-CM | POA: Insufficient documentation

## 2023-04-01 LAB — COMPREHENSIVE METABOLIC PANEL
ALT: 27 U/L (ref 0–44)
AST: 30 U/L (ref 15–41)
Albumin: 3.9 g/dL (ref 3.5–5.0)
Alkaline Phosphatase: 85 U/L (ref 38–126)
Anion gap: 14 (ref 5–15)
BUN: 17 mg/dL (ref 8–23)
CO2: 21 mmol/L — ABNORMAL LOW (ref 22–32)
Calcium: 9.5 mg/dL (ref 8.9–10.3)
Chloride: 99 mmol/L (ref 98–111)
Creatinine, Ser: 0.82 mg/dL (ref 0.61–1.24)
GFR, Estimated: >60 mL/min (ref >60)
Glucose, Bld: 220 mg/dL — ABNORMAL HIGH (ref 70–99)
Potassium: 4.7 mmol/L (ref 3.5–5.1)
Sodium: 134 mmol/L — ABNORMAL LOW (ref 135–145)
Total Bilirubin: 0.8 mg/dL (ref 0.3–1.2)
Total Protein: 7.7 g/dL (ref 6.5–8.1)

## 2023-04-01 LAB — CBC
HCT: 49 % (ref 39.0–52.0)
HCT: 47.1 % (ref 39.0–52.0)
Hemoglobin: 15.1 g/dL (ref 13.0–17.0)
Hemoglobin: 14.4 g/dL (ref 13.0–17.0)
MCH: 22 pg — ABNORMAL LOW (ref 26.0–34.0)
MCH: 22 pg — ABNORMAL LOW (ref 26.0–34.0)
MCHC: 30.8 g/dL (ref 30.0–36.0)
MCHC: 30.6 g/dL (ref 30.0–36.0)
MCV: 71.4 fL — ABNORMAL LOW (ref 80.0–100.0)
MCV: 72 fL — ABNORMAL LOW (ref 80.0–100.0)
Platelets: 247 10*3/uL (ref 150–400)
Platelets: 208 10*3/uL (ref 150–400)
RBC: 6.86 MIL/uL — ABNORMAL HIGH (ref 4.22–5.81)
RBC: 6.54 MIL/uL — ABNORMAL HIGH (ref 4.22–5.81)
RDW: 20 % — ABNORMAL HIGH (ref 11.5–15.5)
RDW: 20.3 % — ABNORMAL HIGH (ref 11.5–15.5)
WBC: 8.7 10*3/uL (ref 4.0–10.5)
WBC: 9 10*3/uL (ref 4.0–10.5)
nRBC: 0 % (ref 0.0–0.2)
nRBC: 0 % (ref 0.0–0.2)

## 2023-04-01 LAB — LACTIC ACID, PLASMA
Lactic Acid, Venous: 1.9 mmol/L (ref 0.5–1.9)
Lactic Acid, Venous: 2.8 mmol/L (ref 0.5–1.9)

## 2023-04-01 LAB — GLUCOSE, CAPILLARY: Glucose-Capillary: 119 mg/dL — ABNORMAL HIGH (ref 70–99)

## 2023-04-01 LAB — CREATININE, SERUM
Creatinine, Ser: 0.82 mg/dL (ref 0.61–1.24)
GFR, Estimated: >60 mL/min (ref >60)

## 2023-04-01 LAB — LIPASE, BLOOD: Lipase: 29 U/L (ref 11–51)

## 2023-04-01 MED ORDER — HYDROMORPHONE HCL 1 MG/ML IJ SOLN
0.5000 mg | Freq: Once | INTRAMUSCULAR | Status: AC
  Administered 2023-04-01: 0.5 mg via INTRAVENOUS
  Filled 2023-04-01: qty 1

## 2023-04-01 MED ORDER — SODIUM CHLORIDE 0.9 % IV BOLUS
1000.0000 mL | Freq: Once | INTRAVENOUS | Status: AC
  Administered 2023-04-01: 1000 mL via INTRAVENOUS

## 2023-04-01 MED ORDER — ONDANSETRON HCL 4 MG/2ML IJ SOLN
4.0000 mg | Freq: Once | INTRAMUSCULAR | Status: AC
  Administered 2023-04-01: 4 mg via INTRAVENOUS
  Filled 2023-04-01: qty 2

## 2023-04-01 MED ORDER — HYDROMORPHONE HCL 1 MG/ML IJ SOLN
0.5000 mg | Freq: Once | INTRAMUSCULAR | Status: AC
  Administered 2023-04-01: 0.5 mg via INTRAVENOUS
  Filled 2023-04-01 (×2): qty 1

## 2023-04-01 MED ORDER — HYDROMORPHONE HCL 1 MG/ML IJ SOLN: 0.5000 mg | INTRAMUSCULAR | Status: DC | PRN

## 2023-04-01 MED ORDER — SODIUM CHLORIDE 0.9 % IV SOLN: INTRAVENOUS | Status: DC

## 2023-04-01 MED ORDER — INSULIN ASPART 100 UNIT/ML IJ SOLN
0.0000 [IU] | Freq: Four times a day (QID) | INTRAMUSCULAR | Status: DC
  Administered 2023-04-02: 1 [IU] via SUBCUTANEOUS

## 2023-04-01 MED ORDER — ONDANSETRON HCL 4 MG/2ML IJ SOLN
4.0000 mg | Freq: Four times a day (QID) | INTRAMUSCULAR | Status: DC | PRN
  Filled 2023-04-01: qty 2

## 2023-04-01 MED ORDER — LACTATED RINGERS IV SOLN: INTRAVENOUS | Status: DC

## 2023-04-01 MED ORDER — PANTOPRAZOLE SODIUM 40 MG IV SOLR
40.0000 mg | Freq: Two times a day (BID) | INTRAVENOUS | Status: DC
  Administered 2023-04-01 – 2023-04-02 (×2): 40 mg via INTRAVENOUS
  Filled 2023-04-01 (×2): qty 10

## 2023-04-01 MED ORDER — HEPARIN SODIUM (PORCINE) 5000 UNIT/ML IJ SOLN
5000.0000 [IU] | Freq: Three times a day (TID) | INTRAMUSCULAR | Status: DC
  Administered 2023-04-01 – 2023-04-02 (×2): 5000 [IU] via SUBCUTANEOUS
  Filled 2023-04-01 (×2): qty 1

## 2023-04-01 MED ORDER — HYDRALAZINE HCL 20 MG/ML IJ SOLN: 10.0000 mg | Freq: Four times a day (QID) | INTRAMUSCULAR | Status: DC | PRN

## 2023-04-01 MED ORDER — INSULIN GLARGINE-YFGN 100 UNIT/ML ~~LOC~~ SOLN
18.0000 [IU] | Freq: Every day | SUBCUTANEOUS | Status: DC
  Administered 2023-04-01: 18 [IU] via SUBCUTANEOUS
  Filled 2023-04-01 (×3): qty 0.18

## 2023-04-01 MED ORDER — SODIUM CHLORIDE 0.9 % IV SOLN: 250.0000 mL | INTRAVENOUS | Status: DC | PRN

## 2023-04-01 MED ORDER — ONDANSETRON HCL 4 MG PO TABS: 4.0000 mg | ORAL_TABLET | Freq: Four times a day (QID) | ORAL | Status: DC | PRN

## 2023-04-01 MED ORDER — SODIUM CHLORIDE 0.9% FLUSH
3.0000 mL | Freq: Two times a day (BID) | INTRAVENOUS | Status: DC
  Administered 2023-04-01 – 2023-04-02 (×2): 3 mL via INTRAVENOUS

## 2023-04-01 MED ORDER — SODIUM CHLORIDE 0.9% FLUSH: 3.0000 mL | INTRAVENOUS | Status: DC | PRN

## 2023-04-01 MED ORDER — IOHEXOL 350 MG/ML SOLN
75.0000 mL | Freq: Once | INTRAVENOUS | Status: AC | PRN
  Administered 2023-04-01: 75 mL via INTRAVENOUS

## 2023-04-01 NOTE — H&P (Signed)
[facility] History and Physical  Phillip Tate [id number] DOB: [birth year] DOA: 04/01/2023  Referring physician: ED  PCP: Wenda Low, MD   Patient is coming from: Home  Chief Complaint: Abdominal pain  HPI:  19 old male with past medical history of coronary artery disease, hypertension, rectal cancer stage III status post colostomy presented to hospital with nausea abdominal pain and  since last night.  Patient stated that he did have a sudden onset of severe abdominal pain constant across the room mostly in periumbilical area associated with extreme nausea but no vomiting.  Patient has been having weakness and tiredness but no shortness of breath fever chills or chest pain.  No abnormal stool melena.  He has had a few loose stools up to 7 PM yesterday.  Patient is stated that he did have similar episode 8 months back when he was treated conservatively.  Of note he does have history of surgical intervention at [facility] in colostomy was done in [place].  Denies any urinary urgency completed dysuria.  Patient has not taken his medications for diabetes and hypertension due to nausea.  In the ED, vitals were stable.  Labs were within normal limits except for hemoglobin of 14.1.  Sodium of 134.  Creatinine of 0.8.  Lipase was 29.  Lactic acid was 1.9.  CT scan of the abdomen and pelvis done in the ED showed left-sided ostomy with proximal colonic dilatation with some dilated small bowel loops around 4 cm in the mid abdomen with transition point in the right lower quadrant.  In the ED patient received Dilaudid Zofran and IV fluid with some improvement in clinical symptoms.  Patient was then considered for admission to the hospital for further evaluation and treatment.  Assessment and plan.  Abdominal pain nausea secondary to partial small bowel obstruction.  History of rectal cancer with colostomy.  Had previous episode 8 months back..  Will continue with conservative management with  n.p.o., IV fluids, antiemetics and analgesics as necessary.  Continue hydration.  NG tube for decompression.  Check abdominal x-ray in AM.  Patient had surgery at [place] in the past.  Essential hypertension.  Will put the patient on IV antihypertensives.  Closely monitor blood pressure.  Focus on pain control.  Patient is on amlodipine, lisinopril and Coreg at home.  History of coronary artery disease.  No acute issues at this time.  Continue to monitor closely.  Diabetes mellitus type 2.  Patient is on Jardiance, Lantus and metformin at home with sliding scale insulin.  Will continue insulin regimen while in the hospital.  Closely monitor.  Monitor every 4 hourly since patient will be NPO.  Hyperlipidemia.  Continue with Crestor Overall is okay.   DVT Prophylaxis: Heparin subcu  Review of Systems:  All systems were reviewed and were negative unless otherwise mentioned in the HPI   Past Medical History:  Diagnosis Date   Coronary artery disease    DDD (degenerative disc disease), lumbar    Diabetes mellitus without complication    Dyslipidemia    History of rectal cancer    dx 2006 --  Stage III, T3  N1  M0,  s/p  chemoradiation (07-10-2005 to 09-05-2005) and anterior colon resection 11-04-2006   History of shingles    Hyperlipidemia    Hypertension    Iron deficiency anemia    intolerent PO iron--  gets IV iron,  last one 03-12-2015 ([facility])   Organic impotence    Rectal cancer  adenocarcinoma stage lll   Past Surgical History:  Procedure Laterality Date   ANAL RECTAL MANOMETRY N/A 09/04/2021   Procedure: ANO RECTAL MANOMETRY;  Surgeon: Mauri Pole, MD;  Location: [facility];  Service: Endoscopy;  Laterality: N/A;   ANTERIOR COLON RESECTION W/ COLOSTOMY  11-04-2006   CATARACT EXTRACTION W/ INTRAOCULAR LENS IMPLANT Right sept 2015  &  feb 2016   COLONOSCOPY WITH ESOPHAGOGASTRODUODENOSCOPY (EGD)  last one 03-07-2010   COLONOSCOPY WITH PROPOFOL N/A  10/23/2015   Procedure: COLONOSCOPY WITH PROPOFOL;  Surgeon: Garlan Fair, MD;  Location: [facility];  Service: Endoscopy;  Laterality: N/A;   COLOSTOMY TAKEDOWN  june 2007   EXAMINATION UNDER ANESTHESIA N/A 04/23/2015   Procedure: EXAM UNDER ANESTHESIA, MANIPULATION OF PENILE PROSTHESIS PUMP;  Surgeon: Kathie Rhodes, MD;  Location: [place];  Service: Urology;  Laterality: N/A;   PENILE PROSTHESIS PLACEMENT  10-29-2009   PORT A CATH PLACEMENT  07-23-2005   prolapsed hemorrhoid      Social History:  reports that he has never smoked. He has never used smokeless tobacco. He reports current alcohol use. He reports that he does not use drugs.  Allergies  Allergen Reactions   Cardizem Cd [Diltiazem Hcl Er Beads] Swelling   Toprol Xl [Metoprolol] Other (See Comments)    headache   Tricor [Fenofibrate] Other (See Comments)    Constipation     Family History  Problem Relation Age of Onset   Hypertension Father    Diabetes Mother    Heart attack Neg Hx    Colon cancer Neg Hx    Esophageal cancer Neg Hx    Rectal cancer Neg Hx    Stomach cancer Neg Hx      Prior to Admission medications   Medication Sig Start Date End Date Taking? Authorizing Provider  acetaminophen (TYLENOL) 500 MG tablet Take 500 mg by mouth every 6 (six) hours as needed for mild pain.    [provider]  amLODipine (NORVASC) 10 MG tablet Take 1 tablet (10 mg total) by mouth daily. 12/18/22   Charlynne Cousins, MD  carvedilol (COREG) 12.5 MG tablet Take 12.5 mg by mouth 2 (two) times daily with a meal.    [provider]  empagliflozin (JARDIANCE) 25 MG TABS tablet Take 25 mg by mouth daily.    [provider]  Ferrous Sulfate (IRON PO) Take 1 tablet by mouth daily.    [provider]  gabapentin (NEURONTIN) 300 MG capsule Take 300 mg by mouth 2 (two) times daily. 08/02/16   [provider]  ibuprofen (ADVIL) 200 MG tablet Take 200 mg by mouth every  6 (six) hours as needed for mild pain.    [provider]  insulin glargine (LANTUS) 100 UNIT/ML injection Inject 36 Units into the skin at bedtime.    [provider]  lactulose (CEPHULAC) 10 g packet Take 10 g by mouth every other day. Patient not taking: Reported on 12/09/2022    [provider]  lisinopril (ZESTRIL) 40 MG tablet Take 1 tablet (40 mg total) by mouth daily. 01/22/23   Charlynne Cousins, MD  mesalamine (LIALDA) 1.2 g EC tablet Take 2 tablets (2.4 g total) by mouth daily with breakfast. Patient not taking: Reported on 05/07/2022 09/24/21   Thornton Park, MD  metFORMIN (GLUCOPHAGE) 850 MG tablet Take 850 mg by mouth 2 (two) times daily with a meal.    [provider]  methocarbamol (ROBAXIN) 500 MG tablet Take 1  and 1/2 tablets (750 mg total) by mouth every 6 (six) hours as needed for muscle spasms. 12/11/22   Charlynne Cousins, MD  NOVOLOG FLEXPEN 100 UNIT/ML FlexPen Inject 13-45 Units into the skin See admin instructions. Take 1 to 18 units at breakfast, 25 to 30 units at lunch, and 40 to 45 units at evening 07/18/16   [provider]  pantoprazole (PROTONIX) 40 MG tablet TAKE 1 TABLET TWICE DAILY Patient taking differently: Take 40 mg by mouth daily. 05/30/22   Thornton Park, MD  Polyethyl Glycol-Propyl Glycol (SYSTANE FREE OP) Place 1 drop into both eyes in the morning and at bedtime.    [provider]  rosuvastatin (CRESTOR) 10 MG tablet Take 1 tablet (10 mg total) by mouth daily. Patient taking differently: Take 5 mg by mouth every evening. 09/18/16   Jettie Booze, MD  zolpidem (AMBIEN) 10 MG tablet Take 10 mg by mouth at bedtime as needed for sleep. 10/01/15   [provider]    Physical Exam: Vitals:   04/01/23 1126 04/01/23 1127 04/01/23 1415  BP:  126/87 (!) 151/82  Pulse:  73 77  Resp:  15 (!) 24  Temp:  (!) 97.4 F (36.3 C)   SpO2:  97% 93%  Weight: 99.8 kg    Height: 5\' 9"  (1.753 m)      Wt Readings from Last 3 Encounters:  04/01/23 99.8 kg  12/09/22 101 kg  05/08/22 101.1 kg   Body mass index is 32.49 kg/m.  General: Obese built, not in obvious distress HENT: Normocephalic, No scleral pallor or icterus noted. Oral mucosa is moist.  Chest:  Clear breath sounds.  . No crackles or wheezes.  CVS: S1 &S2 heard. No murmur.  Regular rate and rhythm. Abdomen: Soft, distention with tenderness mostly in the lower abdomen bowel sounds are heard. No abdominal mass palpated.  Colostomy bag in place without any stool. Extremities: No cyanosis, clubbing or edema.  Peripheral pulses are palpable. Psych: Alert, awake and oriented, normal mood CNS:  No cranial nerve deficits.  Power equal in all extremities.   Skin: Warm and dry.  No rashes noted.  Labs on Admission:   CBC: Recent Labs  Lab 04/01/23 1130  WBC 8.7  HGB 15.1  HCT 49.0  MCV 71.4*  PLT [id number]    Basic Metabolic Panel: Recent Labs  Lab 04/01/23 1130  NA 134*  K 4.7  CL 99  CO2 21*  GLUCOSE 220*  BUN 17  CREATININE 0.82  CALCIUM 9.5    Liver Function Tests: Recent Labs  Lab 04/01/23 1130  AST 30  ALT 27  ALKPHOS 85  BILITOT 0.8  PROT 7.7  ALBUMIN 3.9   Recent Labs  Lab 04/01/23 1130  LIPASE 29   No results for input(s): "AMMONIA" in the last 168 hours.  Cardiac Enzymes: No results for input(s): "CKTOTAL", "CKMB", "CKMBINDEX", "TROPONINI" in the last 168 hours.  BNP (last 3 results) No results for input(s): "BNP" in the last 8760 hours.  ProBNP (last 3 results) No results for input(s): "PROBNP" in the last 8760 hours.  CBG: No results for input(s): "GLUCAP" in the last 168 hours.  Lipase     Component Value Date/Time   LIPASE 29 04/01/2023 1130     Urinalysis    Component Value Date/Time   COLORURINE YELLOW 05/07/2022 1344   APPEARANCEUR CLEAR 05/07/2022 1344   LABSPEC 1.024 05/07/2022 1344   PHURINE 8.0 05/07/2022 1344   GLUCOSEU >=500 (A) 05/07/2022  [place] 05/07/2022 1344   BILIRUBINUR NEGATIVE 05/07/2022 1344   KETONESUR 5 (A) 05/07/2022 1344   PROTEINUR 100 (A) 05/07/2022 1344   UROBILINOGEN 0.2 10/18/2007 1926   NITRITE NEGATIVE 05/07/2022 1344   LEUKOCYTESUR NEGATIVE 05/07/2022 1344     Drugs of Abuse  No results found for: "LABOPIA", "COCAINSCRNUR", "LABBENZ", "AMPHETMU", "THCU", "LABBARB"    Radiological Exams on Admission: CT ABDOMEN PELVIS W CONTRAST  Result Date: 04/01/2023 CLINICAL DATA:  Abdominal pain. EXAM: CT ABDOMEN AND PELVIS WITH CONTRAST TECHNIQUE: Multidetector CT imaging of the abdomen and pelvis was performed using the standard protocol following bolus administration of intravenous contrast. RADIATION DOSE REDUCTION: This exam was performed according to the departmental dose-optimization program which includes automated exposure control, adjustment of the mA and/or kV according to patient size and/or use of iterative reconstruction technique. CONTRAST:  69mL OMNIPAQUE IOHEXOL 350 MG/ML SOLN COMPARISON:  CT 09/04/2022 FINDINGS: Lower chest: There is motion at the lung bases. No pleural effusion. There is some dependent atelectasis. Coronary artery calcifications are seen. Small hiatal hernia. Hepatobiliary: Patent portal vein. Gallbladder is nondilated. No space-occupying liver lesion. Pancreas: Unremarkable. No pancreatic ductal dilatation or surrounding inflammatory changes. Spleen: Normal in size without focal abnormality. Adrenals/Urinary Tract: The adrenal glands are preserved. No enhancing renal mass or collecting system dilatation. The ureters have normal course and caliber down to the bladder. Preserved contours of the urinary bladder. There is a penile prosthesis in place with the reservoir in the right hemipelvis lateral to the bladder with some mass effect along the right side of the bladder, unchanged from prior Stomach/Bowel: Once again there has been resection of the distal colon with a left-sided ostomy. The  residual proximal colon has some moderate stool without dilatation. Normal appendix. Stomach is nondilated. Surgical changes as well along some loops of small bowel. There are some dilated loops of small bowel measuring up to 4 cm. These are new from previous. This does have some small bowel stool appearance. There is a mild transition near the suture line along small bowel in the anterior right hemipelvis. Coronal image 41 of series 6 and axial series 3, image 65. No pneumatosis or free air. Of note there is a fat containing peristomal hernia along the ostomy. But overall smaller than previous Vascular/Lymphatic: Normal caliber aorta and IVC with scattered vascular calcifications. No abnormal lymph node enlargement identified in the abdomen and pelvis. Reproductive: Prostate is unremarkable. Other: Area of fat necrosis along the left upper quadrant anterior to the stomach on image 14, unchanged. No free air or free fluid. Musculoskeletal: Mild degenerative changes of the spine particularly at L1-2. IMPRESSION: Previous surgical changes of distal colonic resection and left-sided colostomy. There is new mild-to-moderate dilatation of loops of small bowel in the midabdomen with a transition point in the right lower quadrant at the suture line of small-bowel. Developing or partial obstruction is possible. No free air or free fluid. No pneumatosis. Small fat containing peristomal hernia.  Decreased from previous. Coronary artery calcifications. Please correlate for other coronary risk factors. Electronically Signed   By: Jill Side M.D.   On: 04/01/2023 16:10     Consultant: None  Code Status: Full code  Microbiology none  Antibiotics: None  Family Communication:  Patients' condition and plan of care including tests being ordered have been discussed with the patient  who indicate understanding and agree with the plan.   Status is: Observation The patient remains OBS appropriate and will d/c before  2  midnights.   Severity of Illness: The appropriate patient status for this patient is OBSERVATION. Observation status is judged to be reasonable and necessary in order to provide the required intensity of service to ensure the patient's safety. The patient's presenting symptoms, physical exam findings, and initial radiographic and laboratory data in the context of their medical condition is felt to place them at decreased risk for further clinical deterioration. Furthermore, it is anticipated that the patient will be medically stable for discharge from the hospital within 2 midnights of admission.   Signed, Flora Lipps, MD [facility] 04/01/2023

## 2023-04-01 NOTE — ED Triage Notes (Signed)
Pt came in via POV d/t abd pain & n/v since last night, he was last able to eat solid foods around 8pm but has not been able to hold anything down since then. 10/10 pain, A/Ox4.

## 2023-04-01 NOTE — Hospital Course (Addendum)
41 old male with past medical history of coronary artery disease, hypertension, rectal cancer stage III status post colostomy presented to hospital with nausea abdominal pain and  since last night.  Patient stated that he did have a second onset of severe abdominal pain constant across the room mostly in periumbilical area associated with extreme nausea but no vomiting.  Patient has been having weakness and tiredness but no shortness of breath fever chills or chest pain.  No abnormal stool melena.  Denies any urinary urgency completed dysuria.  Patient has not taken his medications for diabetes and hypertension due to nausea.  In the ED vitals were stable.  Labs were within normal limits except for hemoglobin of 14.1.  Sodium of 134.  Creatinine of 0.8.  Lipase was 29.  Lactic acid was 1.9.  CT scan of the abdomen and pelvis done in the ED showed left-sided ostomy with proximal colonic dilatation with some dilated small bowel loops around 4 cm in the mid abdomen with transition point in the right lower quadrant.  In the ED patient received Dilaudid Zofran and IV fluid with some improvement in clinical symptoms.  Patient was then considered for admission to the hospital for further evaluation and treatment.  Assessment and plan.  Abdominal pain nausea secondary to partial small bowel obstruction.  History of rectal cancer with colostomy..  Will continue with conservative management with n.p.o., IV fluids, antiemetics and analgesics as necessary.  Continue hydration.  Continue IV fluids.  Continue IV PPI  Essential hypertension.  Will put the patient on IV antihypertensives.  Closely monitor blood pressure.  Focus on pain control.  Patient is on amlodipine, lisinopril and Coreg at home.  History of coronary artery disease.  No acute issues at this time.  Continue to monitor closely.  Diabetes mellitus type 2.  Patient is on Jardiance, Lantus and metformin at home with sliding scale insulin.  Will continue  insulin regimen while in the hospital.  Closely monitor.  Will decrease the dose of long-acting insulin to half at nighttime.  Hyperlipidemia.  Continue with Crestor and overall is okay.

## 2023-04-01 NOTE — Progress Notes (Signed)
Patrient arrived on unit is A&Ox4  states pain is 3/10 in abd at this time.

## 2023-04-01 NOTE — ED Notes (Signed)
ED TO INPATIENT HANDOFF REPORT  ED Nurse Name and Phone #: Josh  S Name/Age/Gender Phillip Tate 67 y.o. male Room/Bed: 016C/016C  Code Status   Code Status: Prior  Home/SNF/Other Home Patient oriented to: self, place, time, and situation Is this baseline? Yes   Triage Complete: Triage complete  Chief Complaint Bowel obstruction [id number]  Triage Note Pt came in via POV d/t abd pain & n/v since last night, he was last able to eat solid foods around 8pm but has not been able to hold anything down since then. 10/10 pain, A/Ox4.    Allergies Allergies  Allergen Reactions   Cardizem Cd [Diltiazem Hcl Er Beads] Swelling   Toprol Xl [Metoprolol] Other (See Comments)    headache   Tricor [Fenofibrate] Other (See Comments)    Constipation     Level of Care/Admitting Diagnosis ED Disposition     ED Disposition  Admit   Condition  --   Comment  Hospital Area: [facility] [[id number]]  Level of Care: Med-Surg [16]  May place patient in observation at [facility] or [facility] if equivalent level of care is available:: No  Covid Evaluation: Asymptomatic - no recent exposure (last 10 days) testing not required  Diagnosis: Bowel obstruction [id number]  Admitting Physician: Flora Lipps [id number]  Attending Physician: Flora Lipps [id number]          B Medical/Surgery History Past Medical History:  Diagnosis Date   Coronary artery disease    DDD (degenerative disc disease), lumbar    Diabetes mellitus without complication    Dyslipidemia    History of rectal cancer    dx 2006 --  Stage III, T3  N1  M0,  s/p  chemoradiation (07-10-2005 to 09-05-2005) and anterior colon resection 11-04-2006   History of shingles    Hyperlipidemia    Hypertension    Iron deficiency anemia    intolerent PO iron--  gets IV iron,  last one 03-12-2015 ([facility])   Organic impotence    Rectal cancer    adenocarcinoma stage lll   Past Surgical History:   Procedure Laterality Date   ANAL RECTAL MANOMETRY N/A 09/04/2021   Procedure: ANO RECTAL MANOMETRY;  Surgeon: Mauri Pole, MD;  Location: [facility];  Service: Endoscopy;  Laterality: N/A;   ANTERIOR COLON RESECTION W/ COLOSTOMY  11-04-2006   CATARACT EXTRACTION W/ INTRAOCULAR LENS IMPLANT Right sept 2015  &  feb 2016   COLONOSCOPY WITH ESOPHAGOGASTRODUODENOSCOPY (EGD)  last one 03-07-2010   COLONOSCOPY WITH PROPOFOL N/A 10/23/2015   Procedure: COLONOSCOPY WITH PROPOFOL;  Surgeon: Garlan Fair, MD;  Location: [facility];  Service: Endoscopy;  Laterality: N/A;   COLOSTOMY TAKEDOWN  june 2007   EXAMINATION UNDER ANESTHESIA N/A 04/23/2015   Procedure: EXAM UNDER ANESTHESIA, MANIPULATION OF PENILE PROSTHESIS PUMP;  Surgeon: Kathie Rhodes, MD;  Location: [facility];  Service: Urology;  Laterality: N/A;   PENILE PROSTHESIS PLACEMENT  10-29-2009   PORT A CATH PLACEMENT  07-23-2005   prolapsed hemorrhoid       A IV Location/Drains/Wounds Patient Lines/Drains/Airways Status     Active Line/Drains/Airways     Name Placement date Placement time Site Days   Peripheral IV 04/01/23 20 G Left Antecubital 04/01/23  1439  Antecubital  less than 1   Colostomy LUQ --  --  LUQ  --            Intake/Output Last 24 hours  Intake/Output Summary (Last 24 hours) at 04/01/2023 1749  Last data filed at 04/01/2023 1629 Gross per 24 hour  Intake 1000 ml  Output --  Net 1000 ml    Labs/Imaging Results for orders placed or performed during the hospital encounter of 04/01/23 (from the past 48 hour(s))  Lipase, blood     Status: None   Collection Time: 04/01/23 11:30 AM  Result Value Ref Range   Lipase 29 11 - 51 U/L    Comment: Performed at [facility], [place] [address], [place], [place] [address]  Comprehensive metabolic panel     Status: Abnormal   Collection Time: 04/01/23 11:30 AM  Result Value Ref Range   Sodium 134 (L) 135 - 145 mmol/L   Potassium 4.7 3.5 -  5.1 mmol/L   Chloride 99 98 - 111 mmol/L   CO2 21 (L) 22 - 32 mmol/L   Glucose, Bld 220 (H) 70 - 99 mg/dL    Comment: Glucose reference range applies only to samples taken after fasting for at least 8 hours.   BUN 17 8 - 23 mg/dL   Creatinine, Ser 0.82 0.61 - 1.24 mg/dL   Calcium 9.5 8.9 - 10.3 mg/dL   Total Protein 7.7 6.5 - 8.1 g/dL   Albumin 3.9 3.5 - 5.0 g/dL   AST 30 15 - 41 U/L   ALT 27 0 - 44 U/L   Alkaline Phosphatase 85 38 - 126 U/L   Total Bilirubin 0.8 0.3 - 1.2 mg/dL   GFR, Estimated >60 >60 mL/min    Comment: (NOTE) Calculated using the CKD-EPI Creatinine Equation (2021)    Anion gap 14 5 - 15    Comment: Performed at [facility] [address], [place], [place] [address]  CBC     Status: Abnormal   Collection Time: 04/01/23 11:30 AM  Result Value Ref Range   WBC 8.7 4.0 - 10.5 K/uL   RBC 6.86 (H) 4.22 - 5.81 MIL/uL   Hemoglobin 15.1 13.0 - 17.0 g/dL   HCT 49.0 39.0 - 52.0 %   MCV 71.4 (L) 80.0 - 100.0 fL   MCH 22.0 (L) 26.0 - 34.0 pg   MCHC 30.8 30.0 - 36.0 g/dL   RDW 20.0 (H) 11.5 - 15.5 %   Platelets 247 150 - 400 K/uL    Comment: REPEATED TO VERIFY   nRBC 0.0 0.0 - 0.2 %    Comment: Performed at [facility], [place] [address] [place], [place] [address]  Lactic acid, plasma     Status: None   Collection Time: 04/01/23  2:57 PM  Result Value Ref Range   Lactic Acid, Venous 1.9 0.5 - 1.9 mmol/L    Comment: Performed at [facility] [address], [place], [place] [address]   CT ABDOMEN PELVIS W CONTRAST  Result Date: 04/01/2023 CLINICAL DATA:  Abdominal pain. EXAM: CT ABDOMEN AND PELVIS WITH CONTRAST TECHNIQUE: Multidetector CT imaging of the abdomen and pelvis was performed using the standard protocol following bolus administration of intravenous contrast. RADIATION DOSE REDUCTION: This exam was performed according to the departmental dose-optimization program which includes automated exposure control, adjustment of the mA and/or kV  according to patient size and/or use of iterative reconstruction technique. CONTRAST:  78mL OMNIPAQUE IOHEXOL 350 MG/ML SOLN COMPARISON:  CT 09/04/2022 FINDINGS: Lower chest: There is motion at the lung bases. No pleural effusion. There is some dependent atelectasis. Coronary artery calcifications are seen. Small hiatal hernia. Hepatobiliary: Patent portal vein. Gallbladder is nondilated. No space-occupying liver  lesion. Pancreas: Unremarkable. No pancreatic ductal dilatation or surrounding inflammatory changes. Spleen: Normal in size without focal abnormality. Adrenals/Urinary Tract: The adrenal glands are preserved. No enhancing renal mass or collecting system dilatation. The ureters have normal course and caliber down to the bladder. Preserved contours of the urinary bladder. There is a penile prosthesis in place with the reservoir in the right hemipelvis lateral to the bladder with some mass effect along the right side of the bladder, unchanged from prior Stomach/Bowel: Once again there has been resection of the distal colon with a left-sided ostomy. The residual proximal colon has some moderate stool without dilatation. Normal appendix. Stomach is nondilated. Surgical changes as well along some loops of small bowel. There are some dilated loops of small bowel measuring up to 4 cm. These are new from previous. This does have some small bowel stool appearance. There is a mild transition near the suture line along small bowel in the anterior right hemipelvis. Coronal image 41 of series 6 and axial series 3, image 65. No pneumatosis or free air. Of note there is a fat containing peristomal hernia along the ostomy. But overall smaller than previous Vascular/Lymphatic: Normal caliber aorta and IVC with scattered vascular calcifications. No abnormal lymph node enlargement identified in the abdomen and pelvis. Reproductive: Prostate is unremarkable. Other: Area of fat necrosis along the left upper quadrant anterior to  the stomach on image 14, unchanged. No free air or free fluid. Musculoskeletal: Mild degenerative changes of the spine particularly at L1-2. IMPRESSION: Previous surgical changes of distal colonic resection and left-sided colostomy. There is new mild-to-moderate dilatation of loops of small bowel in the midabdomen with a transition point in the right lower quadrant at the suture line of small-bowel. Developing or partial obstruction is possible. No free air or free fluid. No pneumatosis. Small fat containing peristomal hernia.  Decreased from previous. Coronary artery calcifications. Please correlate for other coronary risk factors. Electronically Signed   By: Jill Side M.D.   On: 04/01/2023 16:10    Pending Labs Unresulted Labs (From admission, onward)     Start     Ordered   04/01/23 1442  Lactic acid, plasma  Now then every 2 hours,   R (with STAT occurrences)      04/01/23 1441            Vitals/Pain Today's Vitals   04/01/23 1125 04/01/23 1126 04/01/23 1127 04/01/23 1415  BP:   126/87 (!) 151/82  Pulse:   73 77  Resp:   15 (!) 24  Temp:   (!) 97.4 F (36.3 C)   SpO2:   97% 93%  Weight:  99.8 kg    Height:  5\' 9"  (1.753 m)    PainSc: 10-Worst pain ever       Isolation Precautions No active isolations  Medications Medications  lactated ringers infusion (has no administration in time range)  pantoprazole (PROTONIX) injection 40 mg (has no administration in time range)  HYDROmorphone (DILAUDID) injection 0.5 mg (has no administration in time range)  sodium chloride 0.9 % bolus 1,000 mL (0 mLs Intravenous Stopped 04/01/23 1629)  ondansetron (ZOFRAN) injection 4 mg (4 mg Intravenous Given 04/01/23 1440)  HYDROmorphone (DILAUDID) injection 0.5 mg (0.5 mg Intravenous Given 04/01/23 1439)  iohexol (OMNIPAQUE) 350 MG/ML injection 75 mL (75 mLs Intravenous Contrast Given 04/01/23 1558)    Mobility walks     Focused Assessments     R Recommendations: See Admitting Provider  Note  Report given to:  Additional Notes:

## 2023-04-01 NOTE — ED Provider Notes (Signed)
[place] EMERGENCY DEPARTMENT AT [facility] Provider Note   CSN: [id number] Arrival date & time: 04/01/23  1114     History  Chief Complaint  Patient presents with   Abdominal Pain   Nausea   Emesis    Phillip Tate is a 67 y.o. male with a past medical history of CAD, rectal cancer status post anterior colon resection with colostomy, diabetes, hypertension presents today for evaluation of abdominal pain.  Patient reports abdominal pain started around 8 PM last night.  States the pain is sharp, constant, across his belly but mostly in the periumbilical area.  He reports feeling extremely nauseated but no vomiting.  States he is feeling very weak and tired.  No fever, chest pain or shortness of breath.  No abnormal stool or blood in his colostomy bag.  Denies any urinary symptoms.  Patient states he has not taken his diabetes and hypertension medication this morning due to nausea.   Abdominal Pain Associated symptoms: vomiting   Emesis Associated symptoms: abdominal pain       Past Medical History:  Diagnosis Date   Coronary artery disease    DDD (degenerative disc disease), lumbar    Diabetes mellitus without complication    Dyslipidemia    History of rectal cancer    dx 2006 --  Stage III, T3  N1  M0,  s/p  chemoradiation (07-10-2005 to 09-05-2005) and anterior colon resection 11-04-2006   History of shingles    Hyperlipidemia    Hypertension    Iron deficiency anemia    intolerent PO iron--  gets IV iron,  last one 03-12-2015 ([facility])   Organic impotence    Rectal cancer    adenocarcinoma stage lll   Past Surgical History:  Procedure Laterality Date   ANAL RECTAL MANOMETRY N/A 09/04/2021   Procedure: ANO RECTAL MANOMETRY;  Surgeon: Napoleon Form, MD;  Location: [facility];  Service: Endoscopy;  Laterality: N/A;   ANTERIOR COLON RESECTION W/ COLOSTOMY  11-04-2006   CATARACT EXTRACTION W/ INTRAOCULAR LENS IMPLANT Right sept 2015  &  feb 2016    COLONOSCOPY WITH ESOPHAGOGASTRODUODENOSCOPY (EGD)  last one 03-07-2010   COLONOSCOPY WITH PROPOFOL N/A 10/23/2015   Procedure: COLONOSCOPY WITH PROPOFOL;  Surgeon: Charolett Bumpers, MD;  Location: [facility];  Service: Endoscopy;  Laterality: N/A;   COLOSTOMY TAKEDOWN  june 2007   EXAMINATION UNDER ANESTHESIA N/A 04/23/2015   Procedure: EXAM UNDER ANESTHESIA, MANIPULATION OF PENILE PROSTHESIS PUMP;  Surgeon: Ihor Gully, MD;  Location: [facility];  Service: Urology;  Laterality: N/A;   PENILE PROSTHESIS PLACEMENT  10-29-2009   PORT A CATH PLACEMENT  07-23-2005   prolapsed hemorrhoid       Home Medications Prior to Admission medications   Medication Sig Start Date End Date Taking? Authorizing Provider  amLODipine (NORVASC) 5 MG tablet Take 5 mg by mouth daily.   Yes [provider]  ascorbic acid (VITAMIN C) 500 MG tablet Take 500 mg by mouth 2 (two) times daily.   Yes [provider]  carvedilol (COREG) 25 MG tablet Take 25 mg by mouth 2 (two) times daily with a meal.   Yes [provider]  cholecalciferol (VITAMIN D3) 25 MCG (1000 UNIT) tablet Take 1,000 Units by mouth daily.   Yes [provider]  empagliflozin (JARDIANCE) 25 MG TABS tablet Take 25 mg by mouth daily.   Yes [provider]  Ferrous Sulfate (IRON PO) Take 1 tablet by mouth daily.  Yes [provider]  gabapentin (NEURONTIN) 300 MG capsule Take 300 mg by mouth 2 (two) times daily. 08/02/16  Yes [provider]  ibuprofen (ADVIL) 600 MG tablet Take 600 mg by mouth every 6 (six) hours as needed for mild pain.   Yes [provider]  insulin glargine (LANTUS) 100 UNIT/ML injection Inject 60 Units into the skin at bedtime.   Yes [provider]  lisinopril (ZESTRIL) 40 MG tablet Take 1 tablet (40 mg total) by mouth daily. 01/22/23  Yes Marinda ElkFeliz Ortiz, Abraham, MD  metFORMIN (GLUCOPHAGE) 850 MG tablet Take 850 mg by mouth 2 (two) times  daily with a meal.   Yes [provider]  methocarbamol (ROBAXIN) 500 MG tablet Take 1 and 1/2 tablets (750 mg total) by mouth every 6 (six) hours as needed for muscle spasms. 12/11/22  Yes Marinda ElkFeliz Ortiz, Abraham, MD  Multiple Vitamin (MULTIVITAMIN WITH MINERALS) TABS tablet Take 1 tablet by mouth daily.   Yes [provider]  NOVOLOG FLEXPEN 100 UNIT/ML FlexPen Inject 20-30 Units into the skin 3 (three) times daily with meals. Per sliding scale 07/18/16  Yes [provider]  pantoprazole (PROTONIX) 40 MG tablet TAKE 1 TABLET TWICE DAILY Patient taking differently: Take 40 mg by mouth 2 (two) times daily. 05/30/22  Yes Tressia DanasBeavers, Kimberly, MD  Polyethyl Glycol-Propyl Glycol (SYSTANE FREE OP) Place 1 drop into both eyes 3 (three) times daily.   Yes [provider]  polyethylene glycol (MIRALAX / GLYCOLAX) 17 g packet Take 17 g by mouth daily as needed.   Yes [provider]  rosuvastatin (CRESTOR) 10 MG tablet Take 1 tablet (10 mg total) by mouth daily. Patient taking differently: Take 10 mg by mouth every evening. 09/18/16  Yes Corky CraftsVaranasi, Jayadeep S, MD  zolpidem (AMBIEN) 10 MG tablet Take 10 mg by mouth at bedtime as needed for sleep. 10/01/15  Yes [provider]  acetaminophen (TYLENOL) 500 MG tablet Take 500 mg by mouth every 6 (six) hours as needed for mild pain.    [provider]  amLODipine (NORVASC) 10 MG tablet Take 1 tablet (10 mg total) by mouth daily. Patient not taking: Reported on 04/01/2023 12/18/22   Marinda ElkFeliz Ortiz, Abraham, MD  ibuprofen (ADVIL) 200 MG tablet Take 200 mg by mouth every 6 (six) hours as needed for mild pain.    [provider]  mesalamine (LIALDA) 1.2 g EC tablet Take 2 tablets (2.4 g total) by mouth daily with breakfast. Patient not taking: Reported on 05/07/2022 09/24/21   Tressia DanasBeavers, Kimberly, MD      Allergies    Cardizem cd [diltiazem hcl er beads], Toprol xl [metoprolol], and Tricor [fenofibrate]     Review of Systems   Review of Systems  Gastrointestinal:  Positive for abdominal pain and vomiting.    Physical Exam Updated Vital Signs BP (!) 145/76 (BP Location: Right Arm)   Pulse 71   Temp 98.2 F (36.8 C) (Oral)   Resp 16   Ht 5\' 9"  (1.753 m)   Wt 99.8 kg   SpO2 96%   BMI 32.49 kg/m  Physical Exam Vitals and nursing note reviewed.  Constitutional:      Appearance: Normal appearance. He is ill-appearing.  HENT:     Head: Normocephalic and atraumatic.     Mouth/Throat:     Mouth: Mucous membranes are moist.  Eyes:     General: No scleral icterus. Cardiovascular:     Rate and Rhythm: Normal rate and regular rhythm.  Pulses: Normal pulses.     Heart sounds: Normal heart sounds.  Pulmonary:     Effort: Pulmonary effort is normal.     Breath sounds: Normal breath sounds.  Abdominal:     General: Abdomen is flat.     Palpations: Abdomen is soft.     Tenderness: There is generalized abdominal tenderness.     Comments: Colostomy bag in the left lower quadrant.  No skin erythema around the beg opening.  Musculoskeletal:        General: No deformity.  Skin:    General: Skin is warm.     Coloration: Skin is pale.     Findings: No rash.  Neurological:     General: No focal deficit present.     Mental Status: He is alert.  Psychiatric:        Mood and Affect: Mood normal.     ED Results / Procedures / Treatments   Labs (all labs ordered are listed, but only abnormal results are displayed) Labs Reviewed  COMPREHENSIVE METABOLIC PANEL - Abnormal; Notable for the following components:      Result Value   Sodium 134 (*)    CO2 21 (*)    Glucose, Bld 220 (*)    All other components within normal limits  CBC - Abnormal; Notable for the following components:   RBC 6.86 (*)    MCV 71.4 (*)    MCH 22.0 (*)    RDW 20.0 (*)    All other components within normal limits  LACTIC ACID, PLASMA - Abnormal; Notable for the following components:   Lactic Acid, Venous  2.8 (*)    All other components within normal limits  CBC - Abnormal; Notable for the following components:   RBC 6.54 (*)    MCV 72.0 (*)    MCH 22.0 (*)    RDW 20.3 (*)    All other components within normal limits  CBC - Abnormal; Notable for the following components:   RBC 6.01 (*)    MCV 70.0 (*)    MCH 21.6 (*)    RDW 19.5 (*)    All other components within normal limits  COMPREHENSIVE METABOLIC PANEL - Abnormal; Notable for the following components:   Glucose, Bld 110 (*)    Calcium 8.5 (*)    Albumin 3.2 (*)    All other components within normal limits  GLUCOSE, CAPILLARY - Abnormal; Notable for the following components:   Glucose-Capillary 119 (*)    All other components within normal limits  GLUCOSE, CAPILLARY - Abnormal; Notable for the following components:   Glucose-Capillary 113 (*)    All other components within normal limits  LIPASE, BLOOD  LACTIC ACID, PLASMA  CREATININE, SERUM  MAGNESIUM  LACTIC ACID, PLASMA  LACTIC ACID, PLASMA  GLUCOSE, CAPILLARY    EKG None  Radiology Abd 1 View (KUB)  Result Date: 04/02/2023 CLINICAL DATA:  67 year old male under evaluation for small bowel obstruction. EXAM: ABDOMEN - 1 VIEW COMPARISON:  04/01/2023. FINDINGS: Nasogastric tube in position with tip in the mid body of the stomach and side port just distal to the gastroesophageal junction. Gas and stool are seen scattered throughout the colon extending to the level of the distal rectum. No pathologic distension of small bowel is noted. No gross evidence of pneumoperitoneum. Penile implant incidentally noted. IMPRESSION: 1. Nonobstructive bowel-gas pattern. 2. No pneumoperitoneum. 3. Support apparatus, as above. Electronically Signed   By: Trudie Reed M.D.   On: 04/02/2023 06:02  DG Abd Portable 1 View  Result Date: 04/01/2023 CLINICAL DATA:  Nasogastric tube placement EXAM: PORTABLE ABDOMEN - 1 VIEW COMPARISON:  Earlier today FINDINGS: Enteric tube with tip and side  port at the stomach. The bowel gas pattern is normal. Clear lung bases. IMPRESSION: Enteric tube with tip and side port at the stomach. Electronically Signed   By: Tiburcio PeaJonathan  Watts M.D.   On: 04/01/2023 18:32   CT ABDOMEN PELVIS W CONTRAST  Result Date: 04/01/2023 CLINICAL DATA:  Abdominal pain. EXAM: CT ABDOMEN AND PELVIS WITH CONTRAST TECHNIQUE: Multidetector CT imaging of the abdomen and pelvis was performed using the standard protocol following bolus administration of intravenous contrast. RADIATION DOSE REDUCTION: This exam was performed according to the departmental dose-optimization program which includes automated exposure control, adjustment of the mA and/or kV according to patient size and/or use of iterative reconstruction technique. CONTRAST:  75mL OMNIPAQUE IOHEXOL 350 MG/ML SOLN COMPARISON:  CT 09/04/2022 FINDINGS: Lower chest: There is motion at the lung bases. No pleural effusion. There is some dependent atelectasis. Coronary artery calcifications are seen. Small hiatal hernia. Hepatobiliary: Patent portal vein. Gallbladder is nondilated. No space-occupying liver lesion. Pancreas: Unremarkable. No pancreatic ductal dilatation or surrounding inflammatory changes. Spleen: Normal in size without focal abnormality. Adrenals/Urinary Tract: The adrenal glands are preserved. No enhancing renal mass or collecting system dilatation. The ureters have normal course and caliber down to the bladder. Preserved contours of the urinary bladder. There is a penile prosthesis in place with the reservoir in the right hemipelvis lateral to the bladder with some mass effect along the right side of the bladder, unchanged from prior Stomach/Bowel: Once again there has been resection of the distal colon with a left-sided ostomy. The residual proximal colon has some moderate stool without dilatation. Normal appendix. Stomach is nondilated. Surgical changes as well along some loops of small bowel. There are some dilated  loops of small bowel measuring up to 4 cm. These are new from previous. This does have some small bowel stool appearance. There is a mild transition near the suture line along small bowel in the anterior right hemipelvis. Coronal image 41 of series 6 and axial series 3, image 65. No pneumatosis or free air. Of note there is a fat containing peristomal hernia along the ostomy. But overall smaller than previous Vascular/Lymphatic: Normal caliber aorta and IVC with scattered vascular calcifications. No abnormal lymph node enlargement identified in the abdomen and pelvis. Reproductive: Prostate is unremarkable. Other: Area of fat necrosis along the left upper quadrant anterior to the stomach on image 14, unchanged. No free air or free fluid. Musculoskeletal: Mild degenerative changes of the spine particularly at L1-2. IMPRESSION: Previous surgical changes of distal colonic resection and left-sided colostomy. There is new mild-to-moderate dilatation of loops of small bowel in the midabdomen with a transition point in the right lower quadrant at the suture line of small-bowel. Developing or partial obstruction is possible. No free air or free fluid. No pneumatosis. Small fat containing peristomal hernia.  Decreased from previous. Coronary artery calcifications. Please correlate for other coronary risk factors. Electronically Signed   By: Karen KaysAshok  Gupta M.D.   On: 04/01/2023 16:10    Procedures Procedures    Medications Ordered in ED Medications  lactated ringers infusion ( Intravenous New Bag/Given 04/01/23 1758)  pantoprazole (PROTONIX) injection 40 mg (40 mg Intravenous Given 04/01/23 1759)  insulin glargine-yfgn (SEMGLEE) injection 18 Units (18 Units Subcutaneous Given 04/01/23 2155)  heparin injection 5,000 Units (5,000 Units Subcutaneous Given 04/02/23  9147)  sodium chloride flush (NS) 0.9 % injection 3 mL (3 mLs Intravenous Given 04/01/23 2156)  sodium chloride flush (NS) 0.9 % injection 3 mL (has no administration  in time range)  0.9 %  sodium chloride infusion (has no administration in time range)  HYDROmorphone (DILAUDID) injection 0.5-1 mg (has no administration in time range)  ondansetron (ZOFRAN) tablet 4 mg (has no administration in time range)    Or  ondansetron (ZOFRAN) injection 4 mg (has no administration in time range)  hydrALAZINE (APRESOLINE) injection 10 mg (has no administration in time range)  insulin aspart (novoLOG) injection 0-9 Units ( Subcutaneous Not Given 04/02/23 0628)  sodium chloride 0.9 % bolus 1,000 mL (0 mLs Intravenous Stopped 04/01/23 1629)  ondansetron (ZOFRAN) injection 4 mg (4 mg Intravenous Given 04/01/23 1440)  HYDROmorphone (DILAUDID) injection 0.5 mg (0.5 mg Intravenous Given 04/01/23 1439)  iohexol (OMNIPAQUE) 350 MG/ML injection 75 mL (75 mLs Intravenous Contrast Given 04/01/23 1558)  HYDROmorphone (DILAUDID) injection 0.5 mg (0.5 mg Intravenous Given 04/01/23 1824)  sodium chloride 0.9 % bolus 1,000 mL (1,000 mLs Intravenous New Bag/Given 04/01/23 2203)    ED Course/ Medical Decision Making/ A&P                             Medical Decision Making Amount and/or Complexity of Data Reviewed Labs: ordered. Radiology: ordered.  Risk Prescription drug management. Decision regarding hospitalization.   This patient presents to the ED for abdominal pain, nausea, vomiting, this involves an extensive number of treatment options, and is a complaint that carries with a high risk of complications and morbidity.  The differential diagnosis includes small bowel obstruction, acute gastroenteritis, appendicitis, gallbladder/biliary, pancreatitis, peptic ulcer disease, perforation, increased ICP meningitis, vertigo ACS/MI DKA, EtOH intoxication, cannabinoid hyperemesis.  This is not an exhaustive list.  Lab tests: I ordered and personally interpreted labs.  The pertinent results include: WBC unremarkable. Hbg unremarkable. Platelets unremarkable. Electrolytes unremarkable. BUN,  creatinine unremarkable.  Lactic acid 1.9.  Glucose 220.  Imaging studies: I ordered imaging studies. I personally reviewed, interpreted imaging and agree with the radiologist's interpretations. The results include: CT scan showed developing or partial obstruction.  Problem list/ ED course/ Critical interventions/ Medical management: HPI: See above Vital signs within normal range and stable throughout visit. Laboratory/imaging studies significant for: See above. On physical examination, patient is afebrile and ill appearing. Abdominal exam without peritoneal signs.  There was tenderness to palpation to the area above the umbilical.  CT scan showed developing or partial obstruction of the small intestine.  Dilaudid ordered.  NG tube ordered.  I do think patient requires admission for further evaluation and management. Given work up, low suspicion for acute hepatobiliary disease (including acute cholecystitis or cholangitis), acute pancreatitis (neg lipase), PUD (including gastric perforation), acute infectious processes (pneumonia, hepatitis, pyelonephritis), acute appendicitis, vascular catastrophe, bowel obstruction, viscus perforation, or testicular torsion, diverticulitis. Presentation not consistent with other acute, emergent causes of abdominal pain at this time.  I have reviewed the patient home medicines and have made adjustments as needed.  Cardiac monitoring/EKG: The patient was maintained on a cardiac monitor.  I personally reviewed and interpreted the cardiac monitor which showed an underlying rhythm of: sinus rhythm.  Additional history obtained: External records from outside source obtained and reviewed including: Chart review including previous notes, labs, imaging.  Consultations obtained: I requested consultation with Dr. Tyson Babinski, and discussed lab and imaging findings as well as pertinent plan.  He/she recommended admission..  Disposition Patient is admitted to This chart was  dictated using voice recognition software.  Despite best efforts to proofread,  errors can occur which can change the documentation meaning.          Final Clinical Impression(s) / ED Diagnoses Final diagnoses:  Partial small bowel obstruction    Rx / DC Orders ED Discharge Orders     None         Jeanelle Malling, [place] 04/02/23 1051    Gloris Manchester, MD 04/07/23 [phone]

## 2023-04-02 ENCOUNTER — Observation Stay (HOSPITAL_COMMUNITY): Payer: Medicare HMO

## 2023-04-02 DIAGNOSIS — E119 Type 2 diabetes mellitus without complications: Secondary | ICD-10-CM | POA: Diagnosis not present

## 2023-04-02 DIAGNOSIS — E785 Hyperlipidemia, unspecified: Secondary | ICD-10-CM | POA: Diagnosis not present

## 2023-04-02 DIAGNOSIS — K56600 Partial intestinal obstruction, unspecified as to cause: Secondary | ICD-10-CM | POA: Diagnosis not present

## 2023-04-02 DIAGNOSIS — Z85048 Personal history of other malignant neoplasm of rectum, rectosigmoid junction, and anus: Secondary | ICD-10-CM

## 2023-04-02 DIAGNOSIS — K2100 Gastro-esophageal reflux disease with esophagitis, without bleeding: Secondary | ICD-10-CM | POA: Diagnosis not present

## 2023-04-02 DIAGNOSIS — I10 Essential (primary) hypertension: Secondary | ICD-10-CM | POA: Diagnosis not present

## 2023-04-02 DIAGNOSIS — Z0389 Encounter for observation for other suspected diseases and conditions ruled out: Secondary | ICD-10-CM | POA: Diagnosis not present

## 2023-04-02 LAB — COMPREHENSIVE METABOLIC PANEL
ALT: 18 U/L (ref 0–44)
AST: 21 U/L (ref 15–41)
Albumin: 3.2 g/dL — ABNORMAL LOW (ref 3.5–5.0)
Alkaline Phosphatase: 75 U/L (ref 38–126)
Anion gap: 5 (ref 5–15)
BUN: 16 mg/dL (ref 8–23)
CO2: 24 mmol/L (ref 22–32)
Calcium: 8.5 mg/dL — ABNORMAL LOW (ref 8.9–10.3)
Chloride: 108 mmol/L (ref 98–111)
Creatinine, Ser: 0.85 mg/dL (ref 0.61–1.24)
GFR, Estimated: >60 mL/min (ref >60)
Glucose, Bld: 110 mg/dL — ABNORMAL HIGH (ref 70–99)
Potassium: 3.7 mmol/L (ref 3.5–5.1)
Sodium: 137 mmol/L (ref 135–145)
Total Bilirubin: 0.9 mg/dL (ref 0.3–1.2)
Total Protein: 6.5 g/dL (ref 6.5–8.1)

## 2023-04-02 LAB — LACTIC ACID, PLASMA
Lactic Acid, Venous: 1.3 mmol/L (ref 0.5–1.9)
Lactic Acid, Venous: 1.2 mmol/L (ref 0.5–1.9)

## 2023-04-02 LAB — CBC
HCT: 42.1 % (ref 39.0–52.0)
Hemoglobin: 13 g/dL (ref 13.0–17.0)
MCH: 21.6 pg — ABNORMAL LOW (ref 26.0–34.0)
MCHC: 30.9 g/dL (ref 30.0–36.0)
MCV: 70 fL — ABNORMAL LOW (ref 80.0–100.0)
Platelets: 225 10*3/uL (ref 150–400)
RBC: 6.01 MIL/uL — ABNORMAL HIGH (ref 4.22–5.81)
RDW: 19.5 % — ABNORMAL HIGH (ref 11.5–15.5)
WBC: 8 10*3/uL (ref 4.0–10.5)
nRBC: 0 % (ref 0.0–0.2)

## 2023-04-02 LAB — GLUCOSE, CAPILLARY
Glucose-Capillary: 113 mg/dL — ABNORMAL HIGH (ref 70–99)
Glucose-Capillary: 145 mg/dL — ABNORMAL HIGH (ref 70–99)
Glucose-Capillary: 148 mg/dL — ABNORMAL HIGH (ref 70–99)
Glucose-Capillary: 99 mg/dL (ref 70–99)

## 2023-04-02 LAB — MAGNESIUM: Magnesium: 2 mg/dL (ref 1.7–2.4)

## 2023-04-02 NOTE — Progress Notes (Signed)
Patient given Discharge instructions, verbalized understanding. IV removed by myself. All questions answered at this time. Patient declined wheel chair off unit.

## 2023-04-02 NOTE — Discharge Summary (Signed)
Physician Discharge Summary  Phillip Tate ZOX:[id number] DOB: [birth year] DOA: 04/01/2023  PCP: Georgann Housekeeper, MD  Admit date: 04/01/2023 Discharge date: 04/02/2023  Admitted From: Home  Discharge disposition: Home   Recommendations for Outpatient Follow-Up:   Follow up with your primary care provider in one week.  Check CBC, BMP, magnesium in the next visit  Discharge Diagnosis:   Principal Problem:   Bowel obstruction Active Problems:   History of rectal cancer with ostomy    Diabetes mellitus without complication   Essential hypertension   Gastroesophageal reflux disease   Hyperlipidemia   Discharge Condition: Improved.  Diet recommendation: Low sodium, heart healthy.  Carbohydrate-modified.    Wound care: None.  Code status: Full.   History of Present Illness:   36 old male with past medical history of coronary artery disease, hypertension, rectal cancer stage III status post colostomy presented to hospital with nausea, sudden onset of severe abdominal pain with decreased colostomy output.  In the ED, vitals were stable.  Labs were within normal limits except for hemoglobin of 14.1.  Sodium of 134.  Creatinine of 0.8.  Lipase was 29.  Lactic acid was 1.9.  CT scan of the abdomen and pelvis done in the ED showed left-sided ostomy with proximal colonic dilatation with some dilated small bowel loops around 4 cm in the mid abdomen with transition point in the right lower quadrant.  In the ED, patient received Dilaudid Zofran and IV fluid with some improvement in clinical symptoms.  Patient was then considered for admission to the hospital for further evaluation and treatment.    Hospital Course:   Following conditions were addressed during hospitalization as listed below,  Abdominal pain, nausea secondary to partial small bowel obstruction.   History of rectal cancer with colostomy.  Had previous similar episode 8 months back..  Patient was treated  conservatively with n.p.o., IV fluids, antiemetics and analgesics as necessary with significant improvement in  symptoms.    Patient had NG tube briefly which was discontinued.  Patient did have return of bowel movements, x-ray of the abdomen showed nonobstructive bowel gas pattern.  Patient felt better and was advised on his diet.  At this time, patient has felt better and is stable for disposition home..    Essential hypertension.    On amlodipine, lisinopril and Coreg at home.  Will be resumed on discharge   History of coronary artery disease.   No acute issues at this time.  Continue to monitor closely.   Diabetes mellitus type 2.   Patient is on Jardiance, Lantus and metformin at home.  Will be resumed on discharge.    Hyperlipidemia.  Continue with Crestor  Disposition.  At this time, patient is stable for disposition home with outpatient PCP follow-up.  Medical Consultants:   None.  Procedures:    NG tube insertion and removal Subjective:   Today, patient was seen and examined at bedside.  Continues to feel better.  No nausea vomiting abdominal pain.  Has tolerated oral diet.  Has had colostomy output.  Discharge Exam:   Vitals:   04/02/23 0737 04/02/23 0737  BP: (!) 145/76 (!) 145/76  Pulse: 70 71  Resp:    Temp: 98.2 F (36.8 C) 98.2 F (36.8 C)  SpO2: 97% 96%   Vitals:   04/01/23 2358 04/02/23 0414 04/02/23 0737 04/02/23 0737  BP: (!) 147/69 (!) 146/74 (!) 145/76 (!) 145/76  Pulse: 74 75 70 71  Resp: 15 16  Temp: 98.7 F (37.1 C) 97.9 F (36.6 C) 98.2 F (36.8 C) 98.2 F (36.8 C)  TempSrc:  Oral Oral Oral  SpO2: 97% 100% 97% 96%  Weight:      Height:        General: Alert awake, not in obvious distress, obese built HENT: pupils equally reacting to light,  No scleral pallor or icterus noted. Oral mucosa is moist.  Chest:  Clear breath sounds.  Diminished breath sounds bilaterally. No crackles or wheezes.  CVS: S1 &S2 heard. No murmur.  Regular rate  and rhythm. Abdomen: Soft, nontender, nondistended.  Bowel sounds are heard.  Colostomy with stool in the bag. Extremities: No cyanosis, clubbing or edema.  Peripheral pulses are palpable. Psych: Alert, awake and oriented, normal mood CNS:  No cranial nerve deficits.  Power equal in all extremities.   Skin: Warm and dry.  No rashes noted.  The results of significant diagnostics from this hospitalization (including imaging, microbiology, ancillary and laboratory) are listed below for reference.     Diagnostic Studies:   Abd 1 View (KUB)  Result Date: 04/02/2023 CLINICAL DATA:  67 year old male under evaluation for small bowel obstruction. EXAM: ABDOMEN - 1 VIEW COMPARISON:  04/01/2023. FINDINGS: Nasogastric tube in position with tip in the mid body of the stomach and side port just distal to the gastroesophageal junction. Gas and stool are seen scattered throughout the colon extending to the level of the distal rectum. No pathologic distension of small bowel is noted. No gross evidence of pneumoperitoneum. Penile implant incidentally noted. IMPRESSION: 1. Nonobstructive bowel-gas pattern. 2. No pneumoperitoneum. 3. Support apparatus, as above. Electronically Signed   By: Trudie Reed M.D.   On: 04/02/2023 06:02   DG Abd Portable 1 View  Result Date: 04/01/2023 CLINICAL DATA:  Nasogastric tube placement EXAM: PORTABLE ABDOMEN - 1 VIEW COMPARISON:  Earlier today FINDINGS: Enteric tube with tip and side port at the stomach. The bowel gas pattern is normal. Clear lung bases. IMPRESSION: Enteric tube with tip and side port at the stomach. Electronically Signed   By: Tiburcio Pea M.D.   On: 04/01/2023 18:32   CT ABDOMEN PELVIS W CONTRAST  Result Date: 04/01/2023 CLINICAL DATA:  Abdominal pain. EXAM: CT ABDOMEN AND PELVIS WITH CONTRAST TECHNIQUE: Multidetector CT imaging of the abdomen and pelvis was performed using the standard protocol following bolus administration of intravenous contrast.  RADIATION DOSE REDUCTION: This exam was performed according to the departmental dose-optimization program which includes automated exposure control, adjustment of the mA and/or kV according to patient size and/or use of iterative reconstruction technique. CONTRAST:  75mL OMNIPAQUE IOHEXOL 350 MG/ML SOLN COMPARISON:  CT 09/04/2022 FINDINGS: Lower chest: There is motion at the lung bases. No pleural effusion. There is some dependent atelectasis. Coronary artery calcifications are seen. Small hiatal hernia. Hepatobiliary: Patent portal vein. Gallbladder is nondilated. No space-occupying liver lesion. Pancreas: Unremarkable. No pancreatic ductal dilatation or surrounding inflammatory changes. Spleen: Normal in size without focal abnormality. Adrenals/Urinary Tract: The adrenal glands are preserved. No enhancing renal mass or collecting system dilatation. The ureters have normal course and caliber down to the bladder. Preserved contours of the urinary bladder. There is a penile prosthesis in place with the reservoir in the right hemipelvis lateral to the bladder with some mass effect along the right side of the bladder, unchanged from prior Stomach/Bowel: Once again there has been resection of the distal colon with a left-sided ostomy. The residual proximal colon has some moderate stool without dilatation. Normal  appendix. Stomach is nondilated. Surgical changes as well along some loops of small bowel. There are some dilated loops of small bowel measuring up to 4 cm. These are new from previous. This does have some small bowel stool appearance. There is a mild transition near the suture line along small bowel in the anterior right hemipelvis. Coronal image 41 of series 6 and axial series 3, image 65. No pneumatosis or free air. Of note there is a fat containing peristomal hernia along the ostomy. But overall smaller than previous Vascular/Lymphatic: Normal caliber aorta and IVC with scattered vascular calcifications. No  abnormal lymph node enlargement identified in the abdomen and pelvis. Reproductive: Prostate is unremarkable. Other: Area of fat necrosis along the left upper quadrant anterior to the stomach on image 14, unchanged. No free air or free fluid. Musculoskeletal: Mild degenerative changes of the spine particularly at L1-2. IMPRESSION: Previous surgical changes of distal colonic resection and left-sided colostomy. There is new mild-to-moderate dilatation of loops of small bowel in the midabdomen with a transition point in the right lower quadrant at the suture line of small-bowel. Developing or partial obstruction is possible. No free air or free fluid. No pneumatosis. Small fat containing peristomal hernia.  Decreased from previous. Coronary artery calcifications. Please correlate for other coronary risk factors. Electronically Signed   By: Karen KaysAshok  Gupta M.D.   On: 04/01/2023 16:10     Labs:   Basic Metabolic Panel: Recent Labs  Lab 04/01/23 1130 04/01/23 1958 04/02/23 0447  NA 134*  --  137  K 4.7  --  3.7  CL 99  --  108  CO2 21*  --  24  GLUCOSE 220*  --  110*  BUN 17  --  16  CREATININE 0.82 0.82 0.85  CALCIUM 9.5  --  8.5*  MG  --   --  2.0   GFR Estimated Creatinine Clearance: 98.2 mL/min (by C-G formula based on SCr of 0.85 mg/dL). Liver Function Tests: Recent Labs  Lab 04/01/23 1130 04/02/23 0447  AST 30 21  ALT 27 18  ALKPHOS 85 75  BILITOT 0.8 0.9  PROT 7.7 6.5  ALBUMIN 3.9 3.2*   Recent Labs  Lab 04/01/23 1130  LIPASE 29   No results for input(s): "AMMONIA" in the last 168 hours. Coagulation profile No results for input(s): "INR", "PROTIME" in the last 168 hours.  CBC: Recent Labs  Lab 04/01/23 1130 04/01/23 1958 04/02/23 0447  WBC 8.7 9.0 8.0  HGB 15.1 14.4 13.0  HCT 49.0 47.1 42.1  MCV 71.4* 72.0* 70.0*  PLT 247 208 225   Cardiac Enzymes: No results for input(s): "CKTOTAL", "CKMB", "CKMBINDEX", "TROPONINI" in the last 168 hours. BNP: Invalid  input(s): "POCBNP" CBG: Recent Labs  Lab 04/01/23 2008 04/02/23 0020 04/02/23 0620 04/02/23 1135  GLUCAP 119* 99 113* 148*   D-Dimer No results for input(s): "DDIMER" in the last 72 hours. Hgb A1c No results for input(s): "HGBA1C" in the last 72 hours. Lipid Profile No results for input(s): "CHOL", "HDL", "LDLCALC", "TRIG", "CHOLHDL", "LDLDIRECT" in the last 72 hours. Thyroid function studies No results for input(s): "TSH", "T4TOTAL", "T3FREE", "THYROIDAB" in the last 72 hours.  Invalid input(s): "FREET3" Anemia work up No results for input(s): "VITAMINB12", "FOLATE", "FERRITIN", "TIBC", "IRON", "RETICCTPCT" in the last 72 hours. Microbiology No results found for this or any previous visit (from the past 240 hour(s)).   Discharge Instructions:   Discharge Instructions     Call MD for:  persistant nausea and  vomiting   Complete by: As directed    Call MD for:  severe uncontrolled pain   Complete by: As directed    Diet Carb Modified   Complete by: As directed    Discharge instructions   Complete by: As directed    Follow-up with your primary care provider in 1 week.  Seek medical attention for worsening symptoms.   Increase activity slowly   Complete by: As directed       Allergies as of 04/02/2023       Reactions   Cardizem Cd [diltiazem Hcl Er Beads] Swelling   Toprol Xl [metoprolol] Other (See Comments)   headache   Tricor [fenofibrate] Other (See Comments)   Constipation        Medication List     STOP taking these medications    mesalamine 1.2 g EC tablet Commonly known as: LIALDA       TAKE these medications    acetaminophen 500 MG tablet Commonly known as: TYLENOL Take 500 mg by mouth every 6 (six) hours as needed for mild pain.   amLODipine 5 MG tablet Commonly known as: NORVASC Take 5 mg by mouth daily. What changed: Another medication with the same name was removed. Continue taking this medication, and follow the directions you see  here.   ascorbic acid 500 MG tablet Commonly known as: VITAMIN C Take 500 mg by mouth 2 (two) times daily.   carvedilol 25 MG tablet Commonly known as: COREG Take 25 mg by mouth 2 (two) times daily with a meal.   cholecalciferol 25 MCG (1000 UNIT) tablet Commonly known as: VITAMIN D3 Take 1,000 Units by mouth daily.   empagliflozin 25 MG Tabs tablet Commonly known as: JARDIANCE Take 25 mg by mouth daily.   gabapentin 300 MG capsule Commonly known as: NEURONTIN Take 300 mg by mouth 2 (two) times daily.   ibuprofen 200 MG tablet Commonly known as: ADVIL Take 200 mg by mouth every 6 (six) hours as needed for mild pain.   ibuprofen 600 MG tablet Commonly known as: ADVIL Take 600 mg by mouth every 6 (six) hours as needed for mild pain.   insulin glargine 100 UNIT/ML injection Commonly known as: LANTUS Inject 60 Units into the skin at bedtime.   IRON PO Take 1 tablet by mouth daily.   lisinopril 40 MG tablet Commonly known as: ZESTRIL Take 1 tablet (40 mg total) by mouth daily.   metFORMIN 850 MG tablet Commonly known as: GLUCOPHAGE Take 850 mg by mouth 2 (two) times daily with a meal.   methocarbamol 500 MG tablet Commonly known as: ROBAXIN Take 1 and 1/2 tablets (750 mg total) by mouth every 6 (six) hours as needed for muscle spasms.   multivitamin with minerals Tabs tablet Take 1 tablet by mouth daily.   NovoLOG FlexPen 100 UNIT/ML FlexPen Generic drug: insulin aspart Inject 20-30 Units into the skin 3 (three) times daily with meals. Per sliding scale   pantoprazole 40 MG tablet Commonly known as: PROTONIX TAKE 1 TABLET TWICE DAILY   polyethylene glycol 17 g packet Commonly known as: MIRALAX / GLYCOLAX Take 17 g by mouth daily as needed.   rosuvastatin 10 MG tablet Commonly known as: CRESTOR Take 1 tablet (10 mg total) by mouth daily. What changed: when to take this   SYSTANE FREE OP Place 1 drop into both eyes 3 (three) times daily.   zolpidem 10  MG tablet Commonly known as: AMBIEN Take 10 mg by mouth  at bedtime as needed for sleep.        Follow-up Information     Georgann Housekeeper, MD .   Specialty: Internal Medicine Contact information: [address] [place] [address] [phone]                  Time coordinating discharge: 39 minutes  Signed:  Tamryn Popko  [facility] 04/02/2023, 2:17 PM

## 2023-04-02 NOTE — Care Management Obs Status (Cosign Needed)
[facility] NOTIFICATION   Patient Details  Name: Phillip Tate MRN: [id number] Date of Birth: [birth year]   Medicare Observation Status Notification Given:  Yes    Curlene Labrum, RN 04/02/2023, 3:09 PM

## 2023-04-02 NOTE — Progress Notes (Signed)
Patient NG was removed, patikent tolerated well, clears ordered for patient

## 2023-04-02 NOTE — TOC Transition Note (Addendum)
Transition of Care [facility]) - CM/SW Discharge Note   Patient Details  Name: Phillip Tate MRN: [id number] Date of Birth: [birth year]  Transition of [facility] CM/SW Contact:  Curlene Labrum, RN Phone Number: 04/02/2023, 3:07 PM   Clinical Narrative:      Transition of Care [facility]) Screening Note   Patient Details  Name: Phillip Tate Date of Birth: 1956-06-24   Transition of [facility]) CM/SW Contact:    Curlene Labrum, RN Phone Number: 04/02/2023, 3:07 PM  Patient was provided with Medicare Observation notice.  The patient states that his wife will be picking him up by car at 6 pm for transport to home.  Transition of Care [facility]) has reviewed patient and no TOC needs have been identified at this time. We will continue to monitor patient advancement through interdisciplinary progression rounds. If new patient transition needs arise, please place a TOC consult.          Patient Goals and CMS Choice      Discharge Placement                         Discharge Plan and Services Additional resources added to the After Visit Summary for                                       Social Determinants of Health (SDOH) Interventions SDOH Screenings   Food Insecurity: No Food Insecurity (04/02/2023)  Housing: Low Risk  (04/02/2023)  Transportation Needs: No Transportation Needs (04/02/2023)  Utilities: Not At Risk (04/02/2023)  Tobacco Use: Low Risk  (04/01/2023)     Readmission Risk Interventions     No data to display

## 2023-04-02 NOTE — Plan of Care (Signed)
A/Ox4 and on room air. Complained of mild 3/10 abdominal pain this shift, but patient didn't think it warranted intervention. Pain eventually subsided. Patient noted movement into his ostomy around midnight. Appliance self managed. Self care. Q4 flushes to NG tube. Q6 blood sugars. LR at 125. Self care.    Problem: Education: Goal: Ability to describe self-care measures that may prevent or decrease complications (Diabetes Survival Skills Education) will improve Outcome: Progressing Goal: Individualized Educational Video(s) Outcome: Progressing   Problem: Coping: Goal: Ability to adjust to condition or change in health will improve Outcome: Progressing   Problem: Fluid Volume: Goal: Ability to maintain a balanced intake and output will improve Outcome: Progressing   Problem: Health Behavior/Discharge Planning: Goal: Ability to identify and utilize available resources and services will improve Outcome: Progressing Goal: Ability to manage health-related needs will improve Outcome: Progressing   Problem: Metabolic: Goal: Ability to maintain appropriate glucose levels will improve Outcome: Progressing   Problem: Nutritional: Goal: Maintenance of adequate nutrition will improve Outcome: Progressing Goal: Progress toward achieving an optimal weight will improve Outcome: Progressing   Problem: Skin Integrity: Goal: Risk for impaired skin integrity will decrease Outcome: Progressing   Problem: Tissue Perfusion: Goal: Adequacy of tissue perfusion will improve Outcome: Progressing   Problem: Education: Goal: Knowledge of General Education information will improve Description: Including pain rating scale, medication(s)/side effects and non-pharmacologic comfort measures Outcome: Progressing   Problem: Health Behavior/Discharge Planning: Goal: Ability to manage health-related needs will improve Outcome: Progressing   Problem: Clinical Measurements: Goal: Ability to maintain  clinical measurements within normal limits will improve Outcome: Progressing Goal: Will remain free from infection Outcome: Progressing Goal: Diagnostic test results will improve Outcome: Progressing Goal: Respiratory complications will improve Outcome: Progressing Goal: Cardiovascular complication will be avoided Outcome: Progressing   Problem: Activity: Goal: Risk for activity intolerance will decrease Outcome: Progressing   Problem: Nutrition: Goal: Adequate nutrition will be maintained Outcome: Progressing   Problem: Coping: Goal: Level of anxiety will decrease Outcome: Progressing   Problem: Elimination: Goal: Will not experience complications related to bowel motility Outcome: Progressing Goal: Will not experience complications related to urinary retention Outcome: Progressing   Problem: Pain Managment: Goal: General experience of comfort will improve Outcome: Progressing   Problem: Safety: Goal: Ability to remain free from injury will improve Outcome: Progressing   Problem: Skin Integrity: Goal: Risk for impaired skin integrity will decrease Outcome: Progressing

## 2023-04-10 DIAGNOSIS — K5900 Constipation, unspecified: Secondary | ICD-10-CM | POA: Diagnosis not present

## 2023-04-10 DIAGNOSIS — J309 Allergic rhinitis, unspecified: Secondary | ICD-10-CM | POA: Diagnosis not present

## 2023-04-10 DIAGNOSIS — Z933 Colostomy status: Secondary | ICD-10-CM | POA: Diagnosis not present

## 2023-04-10 DIAGNOSIS — D649 Anemia, unspecified: Secondary | ICD-10-CM | POA: Diagnosis not present

## 2023-04-10 DIAGNOSIS — K56600 Partial intestinal obstruction, unspecified as to cause: Secondary | ICD-10-CM | POA: Diagnosis not present

## 2023-04-13 DIAGNOSIS — H2512 Age-related nuclear cataract, left eye: Secondary | ICD-10-CM | POA: Diagnosis not present

## 2023-04-13 DIAGNOSIS — H25012 Cortical age-related cataract, left eye: Secondary | ICD-10-CM | POA: Diagnosis not present

## 2023-04-13 DIAGNOSIS — H25042 Posterior subcapsular polar age-related cataract, left eye: Secondary | ICD-10-CM | POA: Diagnosis not present

## 2023-04-13 DIAGNOSIS — E119 Type 2 diabetes mellitus without complications: Secondary | ICD-10-CM | POA: Diagnosis not present

## 2023-04-15 DIAGNOSIS — M5416 Radiculopathy, lumbar region: Secondary | ICD-10-CM | POA: Diagnosis not present

## 2023-04-16 DIAGNOSIS — Z933 Colostomy status: Secondary | ICD-10-CM | POA: Diagnosis not present

## 2023-04-21 DIAGNOSIS — H269 Unspecified cataract: Secondary | ICD-10-CM | POA: Diagnosis not present

## 2023-04-21 DIAGNOSIS — H2512 Age-related nuclear cataract, left eye: Secondary | ICD-10-CM | POA: Diagnosis not present

## 2023-04-23 ENCOUNTER — Telehealth: Payer: Self-pay | Admitting: Pharmacy Technician

## 2023-04-23 ENCOUNTER — Other Ambulatory Visit: Payer: Self-pay

## 2023-04-23 NOTE — Telephone Encounter (Signed)
Auth Submission: NO AUTH NEEDED Site of care: Site of care: [facility] Payer: [employer] Medication & CPT/J Code(s) submitted: Ferrlecit (Ferric Gluconate) [id number] Route of submission (phone, fax, portal):  Phone # Fax # Auth type: Buy/Bill Units/visits requested: 4 Reference number:  Approval from: 04/23/23 to 08/23/23

## 2023-04-29 DIAGNOSIS — L439 Lichen planus, unspecified: Secondary | ICD-10-CM | POA: Diagnosis not present

## 2023-05-05 ENCOUNTER — Ambulatory Visit (INDEPENDENT_AMBULATORY_CARE_PROVIDER_SITE_OTHER): Payer: Medicare HMO

## 2023-05-05 VITALS — BP 107/65 | HR 62 | Temp 97.6°F | Resp 16 | Ht 70.0 in | Wt 223.4 lb

## 2023-05-05 DIAGNOSIS — D509 Iron deficiency anemia, unspecified: Secondary | ICD-10-CM | POA: Diagnosis not present

## 2023-05-05 MED ORDER — SODIUM CHLORIDE 0.9 % IV SOLN
125.0000 mg | Freq: Once | INTRAVENOUS | Status: AC
  Administered 2023-05-05: 125 mg via INTRAVENOUS
  Filled 2023-05-05: qty 10

## 2023-05-05 MED ORDER — EPINEPHRINE 0.3 MG/0.3ML IJ SOAJ: 0.3000 mg | Freq: Once | INTRAMUSCULAR | Status: DC | PRN

## 2023-05-05 MED ORDER — METHYLPREDNISOLONE SODIUM SUCC 125 MG IJ SOLR: 125.0000 mg | Freq: Once | INTRAMUSCULAR | Status: DC | PRN

## 2023-05-05 MED ORDER — DIPHENHYDRAMINE HCL 25 MG PO CAPS
25.0000 mg | ORAL_CAPSULE | Freq: Once | ORAL | Status: AC
  Administered 2023-05-05: 25 mg via ORAL
  Filled 2023-05-05: qty 1

## 2023-05-05 MED ORDER — FAMOTIDINE IN NACL 20-0.9 MG/50ML-% IV SOLN: 20.0000 mg | Freq: Once | INTRAVENOUS | Status: DC | PRN

## 2023-05-05 MED ORDER — DIPHENHYDRAMINE HCL 50 MG/ML IJ SOLN: 50.0000 mg | Freq: Once | INTRAMUSCULAR | Status: DC | PRN

## 2023-05-05 MED ORDER — ACETAMINOPHEN 325 MG PO TABS
650.0000 mg | ORAL_TABLET | Freq: Once | ORAL | Status: AC
  Administered 2023-05-05: 650 mg via ORAL
  Filled 2023-05-05: qty 2

## 2023-05-05 MED ORDER — ALBUTEROL SULFATE HFA 108 (90 BASE) MCG/ACT IN AERS: 2.0000 | INHALATION_SPRAY | Freq: Once | RESPIRATORY_TRACT | Status: DC | PRN

## 2023-05-05 MED ORDER — SODIUM CHLORIDE 0.9 % IV SOLN: Freq: Once | INTRAVENOUS | Status: DC | PRN

## 2023-05-05 NOTE — Progress Notes (Signed)
Diagnosis: Iron Deficiency Anemia  Provider:  Chilton Greathouse MD  Procedure: IV Infusion  IV Type: Peripheral, IV Location: L Antecubital  Ferrlecit (ferric gluconate), Dose: 125 mg  Infusion Start Time: 1335  Infusion Stop Time: 1436  Post Infusion IV Care: Observation period completed and Peripheral IV Discontinued  Discharge: Condition: Good, Destination: Home . AVS Declined  Performed by:  Loney Hering, LPN

## 2023-05-06 DIAGNOSIS — M5416 Radiculopathy, lumbar region: Secondary | ICD-10-CM | POA: Diagnosis not present

## 2023-05-07 DIAGNOSIS — L810 Postinflammatory hyperpigmentation: Secondary | ICD-10-CM | POA: Diagnosis not present

## 2023-05-07 DIAGNOSIS — L300 Nummular dermatitis: Secondary | ICD-10-CM | POA: Diagnosis not present

## 2023-05-12 ENCOUNTER — Ambulatory Visit (INDEPENDENT_AMBULATORY_CARE_PROVIDER_SITE_OTHER): Payer: Medicare HMO

## 2023-05-12 VITALS — BP 104/64 | HR 65 | Temp 97.7°F | Resp 18 | Ht 70.0 in | Wt 222.6 lb

## 2023-05-12 DIAGNOSIS — D509 Iron deficiency anemia, unspecified: Secondary | ICD-10-CM

## 2023-05-12 MED ORDER — FAMOTIDINE IN NACL 20-0.9 MG/50ML-% IV SOLN: 20.0000 mg | Freq: Once | INTRAVENOUS | Status: DC | PRN

## 2023-05-12 MED ORDER — EPINEPHRINE 0.3 MG/0.3ML IJ SOAJ: 0.3000 mg | Freq: Once | INTRAMUSCULAR | Status: DC | PRN

## 2023-05-12 MED ORDER — SODIUM CHLORIDE 0.9 % IV SOLN
125.0000 mg | Freq: Once | INTRAVENOUS | Status: AC
  Administered 2023-05-12: 125 mg via INTRAVENOUS
  Filled 2023-05-12: qty 10

## 2023-05-12 MED ORDER — METHYLPREDNISOLONE SODIUM SUCC 125 MG IJ SOLR: 125.0000 mg | Freq: Once | INTRAMUSCULAR | Status: DC | PRN

## 2023-05-12 MED ORDER — SODIUM CHLORIDE 0.9 % IV SOLN: Freq: Once | INTRAVENOUS | Status: DC | PRN

## 2023-05-12 MED ORDER — ALBUTEROL SULFATE HFA 108 (90 BASE) MCG/ACT IN AERS: 2.0000 | INHALATION_SPRAY | Freq: Once | RESPIRATORY_TRACT | Status: DC | PRN

## 2023-05-12 MED ORDER — DIPHENHYDRAMINE HCL 50 MG/ML IJ SOLN: 50.0000 mg | Freq: Once | INTRAMUSCULAR | Status: DC | PRN

## 2023-05-12 NOTE — Progress Notes (Signed)
Diagnosis: Iron Deficiency Anemia  Provider:  Chilton Greathouse MD  Procedure: IV Infusion  IV Type: Peripheral, IV Location: L Antecubital  Ferrlecit (ferric gluconate), Dose: 125 mg  Infusion Start Time: 1318  Infusion Stop Time: 1418  Post Infusion IV Care: Peripheral IV Discontinued  Discharge: Condition: Good, Destination: Home . AVS Declined  Performed by:  Loney Hering, LPN

## 2023-05-13 DIAGNOSIS — Z8719 Personal history of other diseases of the digestive system: Secondary | ICD-10-CM | POA: Diagnosis not present

## 2023-05-13 DIAGNOSIS — K56600 Partial intestinal obstruction, unspecified as to cause: Secondary | ICD-10-CM | POA: Diagnosis not present

## 2023-05-15 DIAGNOSIS — M5459 Other low back pain: Secondary | ICD-10-CM | POA: Diagnosis not present

## 2023-05-18 DIAGNOSIS — Z933 Colostomy status: Secondary | ICD-10-CM | POA: Diagnosis not present

## 2023-05-19 ENCOUNTER — Ambulatory Visit: Payer: Medicare HMO

## 2023-05-21 ENCOUNTER — Emergency Department (HOSPITAL_COMMUNITY): Payer: Medicare HMO

## 2023-05-21 ENCOUNTER — Encounter (HOSPITAL_COMMUNITY): Payer: Self-pay

## 2023-05-21 ENCOUNTER — Other Ambulatory Visit: Payer: Self-pay

## 2023-05-21 ENCOUNTER — Inpatient Hospital Stay (HOSPITAL_COMMUNITY)
Admission: EM | Admit: 2023-05-21 | Discharge: 2023-05-23 | DRG: 390 | Disposition: A | Discharge status: Home / Self Care | Payer: Medicare HMO | Attending: Internal Medicine | Admitting: Internal Medicine

## 2023-05-21 DIAGNOSIS — N529 Male erectile dysfunction, unspecified: Secondary | ICD-10-CM | POA: Diagnosis present

## 2023-05-21 DIAGNOSIS — Z8619 Personal history of other infectious and parasitic diseases: Secondary | ICD-10-CM

## 2023-05-21 DIAGNOSIS — Z833 Family history of diabetes mellitus: Secondary | ICD-10-CM

## 2023-05-21 DIAGNOSIS — N181 Chronic kidney disease, stage 1: Secondary | ICD-10-CM | POA: Diagnosis present

## 2023-05-21 DIAGNOSIS — K219 Gastro-esophageal reflux disease without esophagitis: Secondary | ICD-10-CM | POA: Diagnosis present

## 2023-05-21 DIAGNOSIS — E875 Hyperkalemia: Secondary | ICD-10-CM | POA: Diagnosis present

## 2023-05-21 DIAGNOSIS — Z85048 Personal history of other malignant neoplasm of rectum, rectosigmoid junction, and anus: Secondary | ICD-10-CM | POA: Diagnosis not present

## 2023-05-21 DIAGNOSIS — K435 Parastomal hernia without obstruction or  gangrene: Secondary | ICD-10-CM | POA: Diagnosis present

## 2023-05-21 DIAGNOSIS — E1140 Type 2 diabetes mellitus with diabetic neuropathy, unspecified: Secondary | ICD-10-CM | POA: Diagnosis present

## 2023-05-21 DIAGNOSIS — D509 Iron deficiency anemia, unspecified: Secondary | ICD-10-CM | POA: Diagnosis present

## 2023-05-21 DIAGNOSIS — I10 Essential (primary) hypertension: Secondary | ICD-10-CM | POA: Diagnosis present

## 2023-05-21 DIAGNOSIS — E1142 Type 2 diabetes mellitus with diabetic polyneuropathy: Secondary | ICD-10-CM | POA: Diagnosis present

## 2023-05-21 DIAGNOSIS — I129 Hypertensive chronic kidney disease with stage 1 through stage 4 chronic kidney disease, or unspecified chronic kidney disease: Secondary | ICD-10-CM | POA: Diagnosis present

## 2023-05-21 DIAGNOSIS — Z9841 Cataract extraction status, right eye: Secondary | ICD-10-CM

## 2023-05-21 DIAGNOSIS — E785 Hyperlipidemia, unspecified: Secondary | ICD-10-CM | POA: Diagnosis present

## 2023-05-21 DIAGNOSIS — Z961 Presence of intraocular lens: Secondary | ICD-10-CM | POA: Diagnosis present

## 2023-05-21 DIAGNOSIS — R1013 Epigastric pain: Secondary | ICD-10-CM

## 2023-05-21 DIAGNOSIS — Z79899 Other long term (current) drug therapy: Secondary | ICD-10-CM

## 2023-05-21 DIAGNOSIS — K2100 Gastro-esophageal reflux disease with esophagitis, without bleeding: Secondary | ICD-10-CM

## 2023-05-21 DIAGNOSIS — E7800 Pure hypercholesterolemia, unspecified: Secondary | ICD-10-CM | POA: Diagnosis not present

## 2023-05-21 DIAGNOSIS — I2510 Atherosclerotic heart disease of native coronary artery without angina pectoris: Secondary | ICD-10-CM | POA: Diagnosis present

## 2023-05-21 DIAGNOSIS — Z923 Personal history of irradiation: Secondary | ICD-10-CM | POA: Diagnosis not present

## 2023-05-21 DIAGNOSIS — M5136 Other intervertebral disc degeneration, lumbar region: Secondary | ICD-10-CM | POA: Diagnosis present

## 2023-05-21 DIAGNOSIS — K56609 Unspecified intestinal obstruction, unspecified as to partial versus complete obstruction: Secondary | ICD-10-CM | POA: Diagnosis present

## 2023-05-21 DIAGNOSIS — Z933 Colostomy status: Secondary | ICD-10-CM | POA: Diagnosis not present

## 2023-05-21 DIAGNOSIS — Z888 Allergy status to other drugs, medicaments and biological substances status: Secondary | ICD-10-CM | POA: Diagnosis not present

## 2023-05-21 DIAGNOSIS — Z8249 Family history of ischemic heart disease and other diseases of the circulatory system: Secondary | ICD-10-CM | POA: Diagnosis not present

## 2023-05-21 DIAGNOSIS — E1122 Type 2 diabetes mellitus with diabetic chronic kidney disease: Secondary | ICD-10-CM | POA: Diagnosis present

## 2023-05-21 DIAGNOSIS — Z4659 Encounter for fitting and adjustment of other gastrointestinal appliance and device: Secondary | ICD-10-CM | POA: Diagnosis not present

## 2023-05-21 DIAGNOSIS — Z4682 Encounter for fitting and adjustment of non-vascular catheter: Secondary | ICD-10-CM | POA: Diagnosis not present

## 2023-05-21 DIAGNOSIS — Z9221 Personal history of antineoplastic chemotherapy: Secondary | ICD-10-CM

## 2023-05-21 DIAGNOSIS — K5989 Other specified functional intestinal disorders: Secondary | ICD-10-CM | POA: Diagnosis not present

## 2023-05-21 DIAGNOSIS — Z7984 Long term (current) use of oral hypoglycemic drugs: Secondary | ICD-10-CM

## 2023-05-21 DIAGNOSIS — Z794 Long term (current) use of insulin: Secondary | ICD-10-CM

## 2023-05-21 DIAGNOSIS — R109 Unspecified abdominal pain: Secondary | ICD-10-CM | POA: Diagnosis present

## 2023-05-21 DIAGNOSIS — K5669 Other partial intestinal obstruction: Secondary | ICD-10-CM | POA: Diagnosis not present

## 2023-05-21 DIAGNOSIS — E119 Type 2 diabetes mellitus without complications: Secondary | ICD-10-CM

## 2023-05-21 DIAGNOSIS — I700 Atherosclerosis of aorta: Secondary | ICD-10-CM | POA: Diagnosis not present

## 2023-05-21 DIAGNOSIS — R110 Nausea: Secondary | ICD-10-CM

## 2023-05-21 LAB — CBC WITH DIFFERENTIAL/PLATELET
Abs Immature Granulocytes: 0.03 10*3/uL (ref 0.00–0.07)
Basophils Absolute: 0.1 10*3/uL (ref 0.0–0.1)
Basophils Relative: 1 %
Eosinophils Absolute: 0.1 10*3/uL (ref 0.0–0.5)
Eosinophils Relative: 1 %
HCT: 49.5 % (ref 39.0–52.0)
Hemoglobin: 15.1 g/dL (ref 13.0–17.0)
Immature Granulocytes: 0 %
Lymphocytes Relative: 19 %
Lymphs Abs: 1.6 10*3/uL (ref 0.7–4.0)
MCH: 22.4 pg — ABNORMAL LOW (ref 26.0–34.0)
MCHC: 30.5 g/dL (ref 30.0–36.0)
MCV: 73.4 fL — ABNORMAL LOW (ref 80.0–100.0)
Monocytes Absolute: 0.3 10*3/uL (ref 0.1–1.0)
Monocytes Relative: 4 %
Neutro Abs: 6.5 10*3/uL (ref 1.7–7.7)
Neutrophils Relative %: 75 %
Platelets: 226 10*3/uL (ref 150–400)
RBC: 6.74 MIL/uL — ABNORMAL HIGH (ref 4.22–5.81)
RDW: 22.3 % — ABNORMAL HIGH (ref 11.5–15.5)
WBC: 8.6 10*3/uL (ref 4.0–10.5)
nRBC: 0 % (ref 0.0–0.2)

## 2023-05-21 LAB — COMPREHENSIVE METABOLIC PANEL
ALT: 23 U/L (ref 0–44)
AST: 24 U/L (ref 15–41)
Albumin: 4 g/dL (ref 3.5–5.0)
Alkaline Phosphatase: 77 U/L (ref 38–126)
Anion gap: 14 (ref 5–15)
BUN: 14 mg/dL (ref 8–23)
CO2: 21 mmol/L — ABNORMAL LOW (ref 22–32)
Calcium: 9.5 mg/dL (ref 8.9–10.3)
Chloride: 101 mmol/L (ref 98–111)
Creatinine, Ser: 0.93 mg/dL (ref 0.61–1.24)
GFR, Estimated: >60 mL/min (ref >60)
Glucose, Bld: 181 mg/dL — ABNORMAL HIGH (ref 70–99)
Potassium: 5.2 mmol/L — ABNORMAL HIGH (ref 3.5–5.1)
Sodium: 136 mmol/L (ref 135–145)
Total Bilirubin: 1.1 mg/dL (ref 0.3–1.2)
Total Protein: 7.7 g/dL (ref 6.5–8.1)

## 2023-05-21 LAB — GLUCOSE, CAPILLARY
Glucose-Capillary: 147 mg/dL — ABNORMAL HIGH (ref 70–99)
Glucose-Capillary: 109 mg/dL — ABNORMAL HIGH (ref 70–99)

## 2023-05-21 LAB — LIPASE, BLOOD: Lipase: 28 U/L (ref 11–51)

## 2023-05-21 LAB — HIV ANTIBODY (ROUTINE TESTING W REFLEX): HIV Screen 4th Generation wRfx: NONREACTIVE

## 2023-05-21 MED ORDER — HYDROMORPHONE HCL 1 MG/ML IJ SOLN
0.5000 mg | INTRAMUSCULAR | Status: DC | PRN
  Administered 2023-05-21: 0.5 mg via INTRAVENOUS
  Filled 2023-05-21: qty 0.5

## 2023-05-21 MED ORDER — HYDROMORPHONE HCL 1 MG/ML IJ SOLN
0.5000 mg | INTRAMUSCULAR | Status: DC | PRN
  Administered 2023-05-21 – 2023-05-22 (×7): 0.5 mg via INTRAVENOUS
  Filled 2023-05-21 (×7): qty 0.5

## 2023-05-21 MED ORDER — IOHEXOL 350 MG/ML SOLN
75.0000 mL | Freq: Once | INTRAVENOUS | Status: AC | PRN
  Administered 2023-05-21: 75 mL via INTRAVENOUS

## 2023-05-21 MED ORDER — LACTATED RINGERS IV BOLUS
1000.0000 mL | Freq: Once | INTRAVENOUS | Status: AC
  Administered 2023-05-21: 1000 mL via INTRAVENOUS

## 2023-05-21 MED ORDER — ONDANSETRON HCL 4 MG/2ML IJ SOLN
4.0000 mg | Freq: Once | INTRAMUSCULAR | Status: AC
  Administered 2023-05-21: 4 mg via INTRAVENOUS
  Filled 2023-05-21: qty 2

## 2023-05-21 MED ORDER — SODIUM CHLORIDE 0.9% FLUSH
3.0000 mL | Freq: Two times a day (BID) | INTRAVENOUS | Status: DC
  Administered 2023-05-21 – 2023-05-23 (×4): 3 mL via INTRAVENOUS

## 2023-05-21 MED ORDER — MORPHINE SULFATE (PF) 4 MG/ML IV SOLN
4.0000 mg | Freq: Once | INTRAVENOUS | Status: AC
  Administered 2023-05-21: 4 mg via INTRAVENOUS
  Filled 2023-05-21: qty 1

## 2023-05-21 MED ORDER — ACETAMINOPHEN 325 MG PO TABS: 650.0000 mg | ORAL_TABLET | Freq: Four times a day (QID) | ORAL | Status: DC | PRN

## 2023-05-21 MED ORDER — ONDANSETRON HCL 4 MG/2ML IJ SOLN
4.0000 mg | Freq: Four times a day (QID) | INTRAMUSCULAR | Status: DC | PRN
  Administered 2023-05-21 – 2023-05-22 (×2): 4 mg via INTRAVENOUS
  Filled 2023-05-21 (×2): qty 2

## 2023-05-21 MED ORDER — INSULIN GLARGINE-YFGN 100 UNIT/ML ~~LOC~~ SOLN
20.0000 [IU] | Freq: Every day | SUBCUTANEOUS | Status: DC
  Administered 2023-05-22: 20 [IU] via SUBCUTANEOUS
  Filled 2023-05-21 (×3): qty 0.2

## 2023-05-21 MED ORDER — DIATRIZOATE MEGLUMINE & SODIUM 66-10 % PO SOLN
90.0000 mL | Freq: Once | ORAL | Status: AC
  Administered 2023-05-21: 90 mL via NASOGASTRIC
  Filled 2023-05-21: qty 90

## 2023-05-21 MED ORDER — INSULIN ASPART 100 UNIT/ML IJ SOLN
0.0000 [IU] | INTRAMUSCULAR | Status: DC
  Administered 2023-05-21: 2 [IU] via SUBCUTANEOUS
  Administered 2023-05-22: 3 [IU] via SUBCUTANEOUS
  Administered 2023-05-22: 2 [IU] via SUBCUTANEOUS
  Administered 2023-05-22: 3 [IU] via SUBCUTANEOUS
  Administered 2023-05-22: 2 [IU] via SUBCUTANEOUS
  Administered 2023-05-23: 3 [IU] via SUBCUTANEOUS

## 2023-05-21 MED ORDER — PROCHLORPERAZINE EDISYLATE 10 MG/2ML IJ SOLN
5.0000 mg | Freq: Four times a day (QID) | INTRAMUSCULAR | Status: DC | PRN
  Administered 2023-05-21: 5 mg via INTRAVENOUS
  Filled 2023-05-21 (×2): qty 2

## 2023-05-21 MED ORDER — ACETAMINOPHEN 650 MG RE SUPP: 650.0000 mg | Freq: Four times a day (QID) | RECTAL | Status: DC | PRN

## 2023-05-21 MED ORDER — SODIUM CHLORIDE 0.9 % IV SOLN: INTRAVENOUS | Status: AC

## 2023-05-21 MED ORDER — HYDROMORPHONE HCL 1 MG/ML IJ SOLN
0.5000 mg | Freq: Once | INTRAMUSCULAR | Status: AC
  Administered 2023-05-21: 0.5 mg via INTRAVENOUS
  Filled 2023-05-21: qty 1

## 2023-05-21 NOTE — Consult Note (Signed)
Consult Note  Phillip Tate 1956-03-07  [id number].    Requesting MD: Glendora Score, MD Chief Complaint/Reason for Consult: SBO HPI:  Patient is a 67 year old male who presented to the ED with abdominal pain, n/v and decreased ostomy output. He has a hx of multiple prior abdominal surgeries including LAR and coloanal j pouch in the early 2000s for rectal cancer, hx of diverting loop ileostomy also that was reversed. 2022 underwent lap assisted descending colostomy in [place], [place] and had a mucosectomy at [facility] as well. He reports abdominal pain started yesterday evening and he has had no gas or stool since then. He recently saw surgeon in [place] and has had intermittent episodes of similar pain that sometimes resolve spontaneously with passage of stool. He initially reports he eats a lot of fiber but then says he does not do that and takes miralax daily. His surgeon in [place] recently told him that if he develops recurrent obstruction he would potentially consider revision of previous anastomosis as well repair of parastomal hernia. PMH otherwise significant for CAD, HTN, HLD, T2DM. Not on any blood thinners.   ROS: Negative other than HPI  Family History  Problem Relation Age of Onset   Hypertension Father    Diabetes Mother    Heart attack Neg Hx    Colon cancer Neg Hx    Esophageal cancer Neg Hx    Rectal cancer Neg Hx    Stomach cancer Neg Hx     Past Medical History:  Diagnosis Date   Coronary artery disease    DDD (degenerative disc disease), lumbar    Diabetes mellitus without complication (HCC)    Dyslipidemia    History of rectal cancer    dx 2006 --  Stage III, T3  N1  M0,  s/p  chemoradiation (07-10-2005 to 09-05-2005) and anterior colon resection 11-04-2006   History of shingles    Hyperlipidemia    Hypertension    Iron deficiency anemia    intolerent PO iron--  gets IV iron,  last one 03-12-2015 ([facility])   Organic impotence    Rectal cancer  (HCC)    adenocarcinoma stage lll    Past Surgical History:  Procedure Laterality Date   ANAL RECTAL MANOMETRY N/A 09/04/2021   Procedure: ANO RECTAL MANOMETRY;  Surgeon: Napoleon Form, MD;  Location: [facility];  Service: Endoscopy;  Laterality: N/A;   ANTERIOR COLON RESECTION W/ COLOSTOMY  11-04-2006   CATARACT EXTRACTION W/ INTRAOCULAR LENS IMPLANT Right sept 2015  &  feb 2016   COLONOSCOPY WITH ESOPHAGOGASTRODUODENOSCOPY (EGD)  last one 03-07-2010   COLONOSCOPY WITH PROPOFOL N/A 10/23/2015   Procedure: COLONOSCOPY WITH PROPOFOL;  Surgeon: Charolett Bumpers, MD;  Location: [facility];  Service: Endoscopy;  Laterality: N/A;   COLOSTOMY TAKEDOWN  june 2007   EXAMINATION UNDER ANESTHESIA N/A 04/23/2015   Procedure: EXAM UNDER ANESTHESIA, MANIPULATION OF PENILE PROSTHESIS PUMP;  Surgeon: Ihor Gully, MD;  Location: [facility];  Service: Urology;  Laterality: N/A;   PENILE PROSTHESIS PLACEMENT  10-29-2009   PORT A CATH PLACEMENT  07-23-2005   prolapsed hemorrhoid      Social History:  reports that he has never smoked. He has never used smokeless tobacco. He reports current alcohol use. He reports that he does not use drugs.  Allergies:  Allergies  Allergen Reactions   Cardizem Cd [Diltiazem Hcl Er Beads] Swelling   Toprol Xl [Metoprolol] Other (See Comments)    headache  Tricor [Fenofibrate] Other (See Comments)    Constipation     (Not in a hospital admission)   Blood pressure (!) 158/87, pulse 70, temperature 98.5 F (36.9 C), temperature source Oral, resp. rate 18, height 5\' 10"  (1.778 m), weight 101 kg, SpO2 100 %. Physical Exam:  General: pleasant, WD, obese male who is laying in bed in NAD HEENT: head is normocephalic, atraumatic.  Sclera are noninjected.  EOMI.  Ears and nose without any masses or lesions.  Mouth is pink and moist Heart: regular, rate, and rhythm.  Lungs: CTAB, no wheezes, rhonchi, or rales noted.  Respiratory effort  nonlabored Abd: soft, mildly ttp in right abdomen without peritonitis, mild distention, +BS, stoma viable in LLQ, multiple surgical scars MS: all 4 extremities are symmetrical with no cyanosis, clubbing, or edema. Skin: warm and dry with no masses, lesions, or rashes Neuro: Cranial nerves 2-12 grossly intact, sensation is normal throughout Psych: A&Ox3 with an appropriate affect.   Results for orders placed or performed during the hospital encounter of 05/21/23 (from the past 48 hour(s))  CBC with Differential     Status: Abnormal   Collection Time: 05/21/23 10:19 AM  Result Value Ref Range   WBC 8.6 4.0 - 10.5 K/uL   RBC 6.74 (H) 4.22 - 5.81 MIL/uL   Hemoglobin 15.1 13.0 - 17.0 g/dL   HCT 16.1 09.6 - 04.5 %   MCV 73.4 (L) 80.0 - 100.0 fL   MCH 22.4 (L) 26.0 - 34.0 pg   MCHC 30.5 30.0 - 36.0 g/dL   RDW 40.9 (H) 81.1 - 91.4 %   Platelets 226 150 - 400 K/uL   nRBC 0.0 0.0 - 0.2 %   Neutrophils Relative % 75 %   Neutro Abs 6.5 1.7 - 7.7 K/uL   Lymphocytes Relative 19 %   Lymphs Abs 1.6 0.7 - 4.0 K/uL   Monocytes Relative 4 %   Monocytes Absolute 0.3 0.1 - 1.0 K/uL   Eosinophils Relative 1 %   Eosinophils Absolute 0.1 0.0 - 0.5 K/uL   Basophils Relative 1 %   Basophils Absolute 0.1 0.0 - 0.1 K/uL   Immature Granulocytes 0 %   Abs Immature Granulocytes 0.03 0.00 - 0.07 K/uL    Comment: Performed at [facility], [address] [place], [place] [address]  Comprehensive metabolic panel     Status: Abnormal   Collection Time: 05/21/23 10:19 AM  Result Value Ref Range   Sodium 136 135 - 145 mmol/L   Potassium 5.2 (H) 3.5 - 5.1 mmol/L   Chloride 101 98 - 111 mmol/L   CO2 21 (L) 22 - 32 mmol/L   Glucose, Bld 181 (H) 70 - 99 mg/dL    Comment: Glucose reference range applies only to samples taken after fasting for at least 8 hours.   BUN 14 8 - 23 mg/dL   Creatinine, Ser 6.21 0.61 - 1.24 mg/dL   Calcium 9.5 8.9 - 30.8 mg/dL   Total Protein 7.7 6.5 - 8.1 g/dL   Albumin 4.0  3.5 - 5.0 g/dL   AST 24 15 - 41 U/L   ALT 23 0 - 44 U/L   Alkaline Phosphatase 77 38 - 126 U/L   Total Bilirubin 1.1 0.3 - 1.2 mg/dL   GFR, Estimated >65 >78 mL/min    Comment: (NOTE) Calculated using the CKD-EPI Creatinine Equation (2021)    Anion gap 14 5 - 15    Comment: Performed at [facility], [address] [place], [place] [address]  Lipase, blood     Status: None   Collection Time: 05/21/23 10:19 AM  Result Value Ref Range   Lipase 28 11 - 51 U/L    Comment: Performed at [facility], [address], [place], [place] [address]   CT ABDOMEN PELVIS W CONTRAST  Result Date: 05/21/2023 CLINICAL DATA:  Severe abdominal pain with decreased ostomy output. History of multiple prior bowel obstructions. EXAM: CT ABDOMEN AND PELVIS WITH CONTRAST TECHNIQUE: Multidetector CT imaging of the abdomen and pelvis was performed using the standard protocol following bolus administration of intravenous contrast. RADIATION DOSE REDUCTION: This exam was performed according to the departmental dose-optimization program which includes automated exposure control, adjustment of the mA and/or kV according to patient size and/or use of iterative reconstruction technique. CONTRAST:  75mL OMNIPAQUE IOHEXOL 350 MG/ML SOLN COMPARISON:  CT abdomen pelvis dated April 01, 2023. FINDINGS: Lower chest: No acute abnormality. Hepatobiliary: No focal liver abnormality is seen. No gallstones, gallbladder wall thickening, or biliary dilatation. Pancreas: Unremarkable. No pancreatic ductal dilatation or surrounding inflammatory changes. Spleen: Normal in size without focal abnormality. Adrenals/Urinary Tract: Adrenal glands are unremarkable. Kidneys are normal, without renal calculi, focal lesion, or hydronephrosis. Bladder is unremarkable. Stomach/Bowel: The stomach is within normal limits. Multiple dilated loops of small bowel with fecalization are again noted with transition point at the small bowel anastomosis in  the right anterior lower abdomen (series 3, image 61). Loops of ileum distal to the anastomosis are decompressed. Postsurgical changes from prior left hemicolectomy again noted with left-sided colostomy. Remaining colon is unremarkable. Normal appendix. Vascular/Lymphatic: Aortic atherosclerosis. No enlarged abdominal or pelvic lymph nodes. Reproductive: Prostate is unremarkable. Penile implant reservoir in the right pelvis again noted. Other: Trace interloop ascites in the right abdomen amongst the dilated small bowel loops, similar to prior study. No pneumoperitoneum. 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 Musculoskeletal: No acute or significant osseous findings. IMPRESSION: 1. Small-bowel obstruction with transition point at the small bowel anastomosis in the right anterior lower abdomen, similar to prior study. 2.  Aortic Atherosclerosis (ICD10-I70.0). Electronically Signed   By: Obie Dredge M.D.   On: 05/21/2023 12:13      Assessment/Plan SBO - patient seen for the same around this time last year and recently admitted 4/4 but had return in bowel function and was discharged  - multiple prior abdominal surgeries, most recently in [place], [place] vs [facility]  - CT today with sbo with transition in RLQ at the level of previous small bowel anastomosis, same noted on CT 4/3  - no peritonitis on exam, would not recommend emergent surgical intervention - recommend NGT placement and SBO protocol - hopefull this will resolve without surgical intervention but we will follow  FEN: NPO, IVF, NGT to LIWS VTE: ok to have LMWH or SQH from surgery standpoint ID: none indicated    I reviewed ED provider notes, last 24 h vitals and pain scores, last 48 h intake and output, last 24 h labs and trends, last 24 h imaging results, and previous hospitalization notes .   Juliet Rude, [facility]  Surgery 05/21/2023, 12:52 PM Please see Amion for pager number during day hours 7:00am-4:30pm

## 2023-05-21 NOTE — ED Provider Notes (Signed)
[facility] [address]  SURGICAL Provider Note  CSN: [id number] Arrival date & time: 05/21/23 1012  Chief Complaint(s) Abdominal Pain  HPI Phillip Tate is a 67 y.o. male with PMH CAD, HTN, rectal cancer status post colostomy, recent hospital discharge on 04/02/2023 for early small bowel obstruction who presents emergency room for evaluation of abdominal pain and decreased ostomy output.  Patient states that this feels distinctly similar to his last hospital presentation for bowel obstruction.  States that symptoms began abruptly last night and has had nausea but no vomiting.  Denies chest pain, shortness breath, headache, fever or other systemic symptoms.   Past Medical History Past Medical History:  Diagnosis Date   Coronary artery disease    DDD (degenerative disc disease), lumbar    Diabetes mellitus without complication (HCC)    Dyslipidemia    History of rectal cancer    dx 2006 --  Stage III, T3  N1  M0,  s/p  chemoradiation (07-10-2005 to 09-05-2005) and anterior colon resection 11-04-2006   History of shingles    Hyperlipidemia    Hypertension    Iron deficiency anemia    intolerent PO iron--  gets IV iron,  last one 03-12-2015 ([facility])   Organic impotence    Rectal cancer (HCC)    adenocarcinoma stage lll   Patient Active Problem List   Diagnosis Date Noted   SBO (small bowel obstruction) (HCC) 05/21/2023   Bowel obstruction (HCC) 04/01/2023   Syncope 12/09/2022   Rib fracture 12/09/2022   Influenza 12/09/2022   Nonhealing nonsurgical wound 12/01/2022   Incisional hernia, without obstruction or gangrene    Irritant contact dermatitis associated with fecal stoma    Colostomy complication (HCC)    Coronary artery disease of native artery of native heart with stable angina pectoris (HCC) 06/22/2022   Frequent PVCs 05/07/2022   Tinnitus of left ear 05/23/2021   Sleep disorder 05/23/2021   Paresthesia 05/23/2021   Neuropathy 05/23/2021    Dysphagia 05/23/2021   Allergic rhinitis 05/23/2021   Rectal pain 03/05/2021   Polyneuropathy due to type 2 diabetes mellitus (HCC) 03/05/2021   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   Long term (current) use of insulin (HCC) 03/05/2021   Lichen planus 03/05/2021   Irregular bowel habits 03/05/2021   Iron deficiency anemia 03/05/2021   Incontinence of feces 03/05/2021   Hyperglycemia due to type 2 diabetes mellitus (HCC) 03/05/2021   History of rectal cancer with ostomy  03/05/2021   History of iron deficiency 03/05/2021   Gastroesophageal reflux disease 03/05/2021   Diabetic retinopathy associated with type 2 diabetes mellitus (HCC) 03/05/2021   Diabetic renal disease (HCC) 03/05/2021   Constipation 03/05/2021   Chronic kidney disease, stage 1 03/05/2021   Cervical disc disease 03/05/2021   Rectal mucosa prolapse 10/26/2018   Essential hypertension 09/18/2016   Hyperlipidemia 09/18/2016   Diabetes mellitus without complication (HCC) 10/11/2015   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 Authorizing Provider  acetaminophen (TYLENOL) 500 MG tablet Take 500 mg by mouth every 6 (six) hours as needed for mild pain.   Yes [provider]  amLODipine (NORVASC) 5 MG tablet Take 5 mg by mouth daily.   Yes [provider]  ascorbic acid (VITAMIN C) 500 MG tablet Take 500 mg by mouth 2 (two) times daily.   Yes [provider]  carvedilol (COREG) 25 MG tablet Take 25 mg by mouth 2 (  two) times daily with a meal.   Yes [provider]  cholecalciferol (VITAMIN D3) 25 MCG (1000 UNIT) tablet Take 1,000 Units by mouth daily.   Yes [provider]  empagliflozin (JARDIANCE) 25 MG TABS tablet Take 25 mg by mouth daily.   Yes [provider]  Ferrous Sulfate (IRON PO) Take 1 tablet by mouth daily.   Yes [provider]  gabapentin (NEURONTIN) 300 MG capsule  Take 300 mg by mouth 2 (two) times daily. 08/02/16  Yes [provider]  ibuprofen (ADVIL) 600 MG tablet Take 600 mg by mouth every 6 (six) hours as needed for mild pain.   Yes [provider]  insulin glargine (LANTUS) 100 UNIT/ML injection Inject 60 Units into the skin at bedtime.   Yes [provider]  lisinopril (ZESTRIL) 40 MG tablet Take 1 tablet (40 mg total) by mouth daily. 01/22/23  Yes Marinda Elk, MD  metFORMIN (GLUCOPHAGE) 850 MG tablet Take 850 mg by mouth 2 (two) times daily with a meal.   Yes [provider]  methocarbamol (ROBAXIN) 500 MG tablet Take 1 and 1/2 tablets (750 mg total) by mouth every 6 (six) hours as needed for muscle spasms. 12/11/22  Yes Marinda Elk, MD  Multiple Vitamin (MULTIVITAMIN WITH MINERALS) TABS tablet Take 1 tablet by mouth daily.   Yes [provider]  NOVOLOG FLEXPEN 100 UNIT/ML FlexPen Inject 20-40 Units into the skin 3 (three) times daily with meals. Per sliding scale 07/18/16  Yes [provider]  Polyethyl Glycol-Propyl Glycol (SYSTANE FREE OP) Place 1 drop into both eyes 3 (three) times daily.   Yes [provider]  polyethylene glycol (MIRALAX / GLYCOLAX) 17 g packet Take 17 g by mouth daily as needed.   Yes [provider]  rosuvastatin (CRESTOR) 10 MG tablet Take 1 tablet (10 mg total) by mouth daily. Patient taking differently: Take 10 mg by mouth every evening. 09/18/16  Yes Corky Crafts, MD  zolpidem (AMBIEN) 10 MG tablet Take 10 mg by mouth at bedtime as needed for sleep. 10/01/15  Yes [provider]  ibuprofen (ADVIL) 200 MG tablet Take 200 mg by mouth every 6 (six) hours as needed for mild pain. Patient not taking: Reported on 05/21/2023    [provider]  pantoprazole (PROTONIX) 40 MG tablet TAKE 1 TABLET TWICE DAILY Patient not taking: Reported on 05/21/2023 05/30/22   Tressia Danas, MD                                                                                                                                     Past Surgical History Past Surgical History:  Procedure Laterality Date   ANAL RECTAL MANOMETRY N/A 09/04/2021   Procedure: ANO RECTAL MANOMETRY;  Surgeon: Napoleon Form, MD;  Location: [facility];  Service: Endoscopy;  Laterality: N/A;   ANTERIOR COLON RESECTION W/ COLOSTOMY  11-04-2006  CATARACT EXTRACTION W/ INTRAOCULAR LENS IMPLANT Right sept 2015  &  feb 2016   COLONOSCOPY WITH ESOPHAGOGASTRODUODENOSCOPY (EGD)  last one 03-07-2010   COLONOSCOPY WITH PROPOFOL N/A 10/23/2015   Procedure: COLONOSCOPY WITH PROPOFOL;  Surgeon: Charolett Bumpers, MD;  Location: [facility];  Service: Endoscopy;  Laterality: N/A;   COLOSTOMY TAKEDOWN  june 2007   EXAMINATION UNDER ANESTHESIA N/A 04/23/2015   Procedure: EXAM UNDER ANESTHESIA, MANIPULATION OF PENILE PROSTHESIS PUMP;  Surgeon: Ihor Gully, MD;  Location: [facility];  Service: Urology;  Laterality: N/A;   PENILE PROSTHESIS PLACEMENT  10-29-2009   PORT A CATH PLACEMENT  07-23-2005   prolapsed hemorrhoid     Family History Family History  Problem Relation Age of Onset   Hypertension Father    Diabetes Mother    Heart attack Neg Hx    Colon cancer Neg Hx    Esophageal cancer Neg Hx    Rectal cancer Neg Hx    Stomach cancer Neg Hx     Social History Social History   Tobacco Use   Smoking status: Never   Smokeless tobacco: Never  Vaping Use   Vaping Use: Never used  Substance Use Topics   Alcohol use: Yes    Comment: Rarely   Drug use: No   Allergies Cardizem cd [diltiazem hcl er beads], Toprol xl [metoprolol], and Tricor [fenofibrate]  Review of Systems Review of Systems  Gastrointestinal:  Positive for abdominal pain and nausea.    Physical Exam Vital Signs  I have reviewed the triage vital signs BP (!) 152/82   Pulse 70   Temp 98.1 F (36.7 C) (Oral)   Resp 19   Ht 5\' 10"  (1.778 m)   Wt 101 kg   SpO2 98%    BMI 31.95 kg/m   Physical Exam Constitutional:      General: He is not in acute distress.    Appearance: Normal appearance.  HENT:     Head: Normocephalic and atraumatic.     Nose: No congestion or rhinorrhea.  Eyes:     General:        Right eye: No discharge.        Left eye: No discharge.     Extraocular Movements: Extraocular movements intact.     Pupils: Pupils are equal, round, and reactive to light.  Cardiovascular:     Rate and Rhythm: Normal rate and regular rhythm.     Heart sounds: No murmur heard. Pulmonary:     Effort: No respiratory distress.     Breath sounds: No wheezing or rales.  Abdominal:     General: There is distension.     Tenderness: There is abdominal tenderness.  Musculoskeletal:        General: Normal range of motion.     Cervical back: Normal range of motion.  Skin:    General: Skin is warm and dry.  Neurological:     General: No focal deficit present.     Mental Status: He is alert.     ED Results and Treatments Labs (all labs ordered are listed, but only abnormal results are displayed) Labs Reviewed  CBC WITH DIFFERENTIAL/PLATELET - Abnormal; Notable for the following components:      Result Value   RBC 6.74 (*)    MCV 73.4 (*)    MCH 22.4 (*)    RDW 22.3 (*)    All other components within normal limits  COMPREHENSIVE METABOLIC PANEL - Abnormal; Notable for the following  components:   Potassium 5.2 (*)    CO2 21 (*)    Glucose, Bld 181 (*)    All other components within normal limits  GLUCOSE, CAPILLARY - Abnormal; Notable for the following components:   Glucose-Capillary 147 (*)    All other components within normal limits  LIPASE, BLOOD  HIV ANTIBODY (ROUTINE TESTING W REFLEX)  URINALYSIS, ROUTINE W REFLEX MICROSCOPIC  BASIC METABOLIC PANEL  CBC                                                                                                                          Radiology DG Abd Portable 1V-Small Bowel Protocol-Position  Verification  Result Date: 05/21/2023 CLINICAL DATA:  NG tube placement. EXAM: PORTABLE ABDOMEN - 1 VIEW COMPARISON:  04/02/2023 FINDINGS: NG tube tip is positioned in the mid stomach. Side port of the NG tube is below the GE junction. Visualized upper abdomen demonstrates nonspecific bowel gas pattern. IMPRESSION: NG tube tip is in the mid stomach. Electronically Signed   By: Kennith Center M.D.   On: 05/21/2023 13:15   CT ABDOMEN PELVIS W CONTRAST  Result Date: 05/21/2023 CLINICAL DATA:  Severe abdominal pain with decreased ostomy output. History of multiple prior bowel obstructions. EXAM: CT ABDOMEN AND PELVIS WITH CONTRAST TECHNIQUE: Multidetector CT imaging of the abdomen and pelvis was performed using the standard protocol following bolus administration of intravenous contrast. RADIATION DOSE REDUCTION: This exam was performed according to the departmental dose-optimization program which includes automated exposure control, adjustment of the mA and/or kV according to patient size and/or use of iterative reconstruction technique. CONTRAST:  75mL OMNIPAQUE IOHEXOL 350 MG/ML SOLN COMPARISON:  CT abdomen pelvis dated April 01, 2023. FINDINGS: Lower chest: No acute abnormality. Hepatobiliary: No focal liver abnormality is seen. No gallstones, gallbladder wall thickening, or biliary dilatation. Pancreas: Unremarkable. No pancreatic ductal dilatation or surrounding inflammatory changes. Spleen: Normal in size without focal abnormality. Adrenals/Urinary Tract: Adrenal glands are unremarkable. Kidneys are normal, without renal calculi, focal lesion, or hydronephrosis. Bladder is unremarkable. Stomach/Bowel: The stomach is within normal limits. Multiple dilated loops of small bowel with fecalization are again noted with transition point at the small bowel anastomosis in the right anterior lower abdomen (series 3, image 61). Loops of ileum distal to the anastomosis are decompressed. Postsurgical changes from prior  left hemicolectomy again noted with left-sided colostomy. Remaining colon is unremarkable. Normal appendix. Vascular/Lymphatic: Aortic atherosclerosis. No enlarged abdominal or pelvic lymph nodes. Reproductive: Prostate is unremarkable. Penile implant reservoir in the right pelvis again noted. Other: Trace interloop ascites in the right abdomen amongst the dilated small bowel loops, similar to prior study. No pneumoperitoneum. 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 Musculoskeletal: No acute or significant osseous findings. IMPRESSION: 1. Small-bowel obstruction with transition point at the small bowel anastomosis in the right anterior lower abdomen, similar to prior study. 2.  Aortic Atherosclerosis (ICD10-I70.0). Electronically Signed  By: Obie Dredge M.D.   On: 05/21/2023 12:13    Pertinent labs & imaging results that were available during my care of the patient were reviewed by me and considered in my medical decision making (see MDM for details).  Medications Ordered in ED Medications  diatrizoate meglumine-sodium (GASTROGRAFIN) 66-10 % solution 90 mL (has no administration in time range)  sodium chloride flush (NS) 0.9 % injection 3 mL (3 mLs Intravenous Given 05/21/23 1549)  0.9 %  sodium chloride infusion ( Intravenous New Bag/Given 05/21/23 1535)  acetaminophen (TYLENOL) tablet 650 mg (has no administration in time range)    Or  acetaminophen (TYLENOL) suppository 650 mg (has no administration in time range)  ondansetron (ZOFRAN) injection 4 mg (4 mg Intravenous Given 05/21/23 1819)  insulin glargine-yfgn (SEMGLEE) injection 20 Units (has no administration in time range)  insulin aspart (novoLOG) injection 0-15 Units (2 Units Subcutaneous Given 05/21/23 1548)  HYDROmorphone (DILAUDID) injection 0.5 mg (0.5 mg Intravenous Given 05/21/23 1815)  morphine (PF) 4 MG/ML injection 4 mg (4 mg Intravenous Given  05/21/23 1049)  ondansetron (ZOFRAN) injection 4 mg (4 mg Intravenous Given 05/21/23 1049)  lactated ringers bolus 1,000 mL (1,000 mLs Intravenous New Bag/Given 05/21/23 1050)  iohexol (OMNIPAQUE) 350 MG/ML injection 75 mL (75 mLs Intravenous Contrast Given 05/21/23 1151)  morphine (PF) 4 MG/ML injection 4 mg (4 mg Intravenous Given 05/21/23 1249)  ondansetron (ZOFRAN) injection 4 mg (4 mg Intravenous Given 05/21/23 1331)  HYDROmorphone (DILAUDID) injection 0.5 mg (0.5 mg Intravenous Given 05/21/23 1416)                                                                                                                                     Procedures Procedures  (including critical care time)  Medical Decision Making / ED Course   This patient presents to the ED for concern of abdominal pain, nausea, this involves an extensive number of treatment options, and is a complaint that carries with it a high risk of complications and morbidity.  The differential diagnosis includes small bowel obstruction, ileus, intra-abdominal infection, mass, gastroenteritis  MDM: Patient seen emergency room for evaluation of abdominal pain and nausea.  Physical exam reveals abdominal distention with generalized abdominal tenderness but is otherwise unremarkable.  Laboratory evaluation with a mild hyperkalemia to 5.2 but is otherwise unremarkable.  CT abdomen pelvis with small bowel obstruction with transition point at the small bowel anastomosis in the right lower abdomen similar to prior study.  NG tube placed and I did speak with the surgery team who will come evaluate the patient but is recommending medical admission.  Patient then admitted to the hospitalist.   Additional history obtained:  -External records from outside source obtained and reviewed including: Chart review including previous notes, labs, imaging, consultation notes   Lab Tests: -I ordered, reviewed, and interpreted labs.   The pertinent results  include:   Labs Reviewed  CBC WITH DIFFERENTIAL/PLATELET - Abnormal; Notable for the following components:      Result Value   RBC 6.74 (*)    MCV 73.4 (*)    MCH 22.4 (*)    RDW 22.3 (*)    All other components within normal limits  COMPREHENSIVE METABOLIC PANEL - Abnormal; Notable for the following components:   Potassium 5.2 (*)    CO2 21 (*)    Glucose, Bld 181 (*)    All other components within normal limits  GLUCOSE, CAPILLARY - Abnormal; Notable for the following components:   Glucose-Capillary 147 (*)    All other components within normal limits  LIPASE, BLOOD  HIV ANTIBODY (ROUTINE TESTING W REFLEX)  URINALYSIS, ROUTINE W REFLEX MICROSCOPIC  BASIC METABOLIC PANEL  CBC      EKG   EKG Interpretation  Date/Time:  Thursday May 21 2023 10:21:22 EDT Ventricular Rate:  70 PR Interval:  172 QRS Duration: 106 QT Interval:  400 QTC Calculation: 432 R Axis:   105 Text Interpretation: Sinus rhythm Probable left atrial enlargement Left posterior fascicular block RSR' in V1 or V2, probably normal variant Confirmed by Korbin Notaro ([id number]) on 05/21/2023 7:09:28 PM         Imaging Studies ordered: I ordered imaging studies including CT abdomen pelvis, KUB I independently visualized and interpreted imaging. I agree with the radiologist interpretation   Medicines ordered and prescription drug management: Meds ordered this encounter  Medications   morphine (PF) 4 MG/ML injection 4 mg   ondansetron (ZOFRAN) injection 4 mg   lactated ringers bolus 1,000 mL   iohexol (OMNIPAQUE) 350 MG/ML injection 75 mL   morphine (PF) 4 MG/ML injection 4 mg   diatrizoate meglumine-sodium (GASTROGRAFIN) 66-10 % solution 90 mL   ondansetron (ZOFRAN) injection 4 mg   sodium chloride flush (NS) 0.9 % injection 3 mL   0.9 %  sodium chloride infusion   OR Linked Order Group    acetaminophen (TYLENOL) tablet 650 mg    acetaminophen (TYLENOL) suppository 650 mg   DISCONTD: HYDROmorphone  (DILAUDID) injection 0.5 mg   ondansetron (ZOFRAN) injection 4 mg   HYDROmorphone (DILAUDID) injection 0.5 mg   insulin glargine-yfgn (SEMGLEE) injection 20 Units   insulin aspart (novoLOG) injection 0-15 Units    Order Specific Question:   Correction coverage:    Answer:   Moderate (average weight, post-op)    Order Specific Question:   CBG < 70:    Answer:   Implement Hypoglycemia Standing Orders and refer to Hypoglycemia Standing Orders sidebar report    Order Specific Question:   CBG 70 - 120:    Answer:   0 units    Order Specific Question:   CBG 121 - 150:    Answer:   2 units    Order Specific Question:   CBG 151 - 200:    Answer:   3 units    Order Specific Question:   CBG 201 - 250:    Answer:   5 units    Order Specific Question:   CBG 251 - 300:    Answer:   8 units    Order Specific Question:   CBG 301 - 350:    Answer:   11 units    Order Specific Question:   CBG 351 - 400:    Answer:   15 units    Order Specific Question:   CBG > 400    Answer:   call MD and obtain STAT lab  verification   HYDROmorphone (DILAUDID) injection 0.5 mg    -I have reviewed the patients home medicines and have made adjustments as needed  Critical interventions none  Consultations Obtained: I requested consultation with the general surgeons on-call,  and discussed lab and imaging findings as well as pertinent plan - they recommend: Medical admission   Cardiac Monitoring: The patient was maintained on a cardiac monitor.  I personally viewed and interpreted the cardiac monitored which showed an underlying rhythm of: NSR  Social Determinants of Health:  Factors impacting patients care include: none   Reevaluation: After the interventions noted above, I reevaluated the patient and found that they have :improved  Co morbidities that complicate the patient evaluation  Past Medical History:  Diagnosis Date   Coronary artery disease    DDD (degenerative disc disease), lumbar     Diabetes mellitus without complication (HCC)    Dyslipidemia    History of rectal cancer    dx 2006 --  Stage III, T3  N1  M0,  s/p  chemoradiation (07-10-2005 to 09-05-2005) and anterior colon resection 11-04-2006   History of shingles    Hyperlipidemia    Hypertension    Iron deficiency anemia    intolerent PO iron--  gets IV iron,  last one 03-12-2015 ([facility])   Organic impotence    Rectal cancer (HCC)    adenocarcinoma stage lll      Dispostion: I considered admission for this patient, and due to small bowel obstruction patient will require hospital admission     Final Clinical Impression(s) / ED Diagnoses Final diagnoses:  SBO (small bowel obstruction) [facility])     @PCDICTATION @    Glendora Score, MD 05/21/23 1909

## 2023-05-21 NOTE — ED Triage Notes (Signed)
Pt to the ed from home with a CC of sever abd pain with no output from his ostomy. Pt had ostomy placed 1.5 years ago and has had multiple obstructions after. Pt relays nausea without vomiting with chills. Pt denies cp, sob, dizziness loc at this time.

## 2023-05-21 NOTE — H&P (Signed)
History and Physical   [name]:[id number] DOB: August 05, 1956 DOA: 05/21/2023  PCP: Georgann Housekeeper, MD   Patient coming from: Home  Chief Complaint: Abdominal pain, decreased ostomy output  HPI: Phillip Tate is a 67 y.o. male with medical history significant of hypertension, hyperlipidemia, neuropathy, diabetes, anemia, GERD, CKD 1, CAD, rectal cancer status post surgery and colostomy creation presenting with abdominal pain and decreased ostomy output.  Patient has repeat episode of pain and no ostomy output starting this morning.  Similar to previous small bowel obstructions that he has had recurrent since surgery for his rectal cancer and ostomy creation.  Denies fevers, chills, chest pain, shortness of breath, nausea, vomiting.  Social*  ED Course: Vital signs in the ED notable for blood pressure in the 150s systolic.  Lab workup included CMP with potassium 5.2, bicarb 21, glucose 181.  CBC within normal limits but MCV noted to be in the 70s.  Lipase negative.  Urinalysis pending.  CT abdomen pelvis showed small bowel obstruction with transition point.  Patient received morphine in the ED, Zofran and also received a liter of IV fluids.  General surgery consulted and  recommended admission, NG tube, small bowel protocol imaging, n.p.o.  Review of Systems: As per HPI otherwise all other systems reviewed and are negative.  Past Medical History:  Diagnosis Date   Coronary artery disease    DDD (degenerative disc disease), lumbar    Diabetes mellitus without complication (HCC)    Dyslipidemia    History of rectal cancer    dx 2006 --  Stage III, T3  N1  M0,  s/p  chemoradiation (07-10-2005 to 09-05-2005) and anterior colon resection 11-04-2006   History of shingles    Hyperlipidemia    Hypertension    Iron deficiency anemia    intolerent PO iron--  gets IV iron,  last one 03-12-2015 ([facility])   Organic impotence    Rectal cancer (HCC)    adenocarcinoma stage lll     Past Surgical History:  Procedure Laterality Date   ANAL RECTAL MANOMETRY N/A 09/04/2021   Procedure: ANO RECTAL MANOMETRY;  Surgeon: Napoleon Form, MD;  Location: [facility];  Tate: Endoscopy;  Laterality: N/A;   ANTERIOR COLON RESECTION W/ COLOSTOMY  11-04-2006   CATARACT EXTRACTION W/ INTRAOCULAR LENS IMPLANT Right sept 2015  &  feb 2016   COLONOSCOPY WITH ESOPHAGOGASTRODUODENOSCOPY (EGD)  last one 03-07-2010   COLONOSCOPY WITH PROPOFOL N/A 10/23/2015   Procedure: COLONOSCOPY WITH PROPOFOL;  Surgeon: Charolett Bumpers, MD;  Location: [facility];  Tate: Endoscopy;  Laterality: N/A;   COLOSTOMY TAKEDOWN  june 2007   EXAMINATION UNDER ANESTHESIA N/A 04/23/2015   Procedure: EXAM UNDER ANESTHESIA, MANIPULATION OF PENILE PROSTHESIS PUMP;  Surgeon: Ihor Gully, MD;  Location: [facility] [place];  Tate: Urology;  Laterality: N/A;   PENILE PROSTHESIS PLACEMENT  10-29-2009   PORT A CATH PLACEMENT  07-23-2005   prolapsed hemorrhoid      Social History  reports that he has never smoked. He has never used smokeless tobacco. He reports current alcohol use. He reports that he does not use drugs.  Allergies  Allergen Reactions   Cardizem Cd [Diltiazem Hcl Er Beads] Swelling   Toprol Xl [Metoprolol] Other (See Comments)    headache   Tricor [Fenofibrate] Other (See Comments)    Constipation     Family History  Problem Relation Age of Onset   Hypertension Father    Diabetes Mother    Heart attack  Neg Hx    Colon cancer Neg Hx    Esophageal cancer Neg Hx    Rectal cancer Neg Hx    Stomach cancer Neg Hx   Reviewed on admission  Prior to Admission medications   Medication Sig Start Date End Date Taking? Authorizing Provider  acetaminophen (TYLENOL) 500 MG tablet Take 500 mg by mouth every 6 (six) hours as needed for mild pain.    [provider]  amLODipine (NORVASC) 5 MG tablet Take 5 mg by mouth daily.    [provider]  ascorbic acid  (VITAMIN C) 500 MG tablet Take 500 mg by mouth 2 (two) times daily.    [provider]  carvedilol (COREG) 25 MG tablet Take 25 mg by mouth 2 (two) times daily with a meal.    [provider]  cholecalciferol (VITAMIN D3) 25 MCG (1000 UNIT) tablet Take 1,000 Units by mouth daily.    [provider]  empagliflozin (JARDIANCE) 25 MG TABS tablet Take 25 mg by mouth daily.    [provider]  Ferrous Sulfate (IRON PO) Take 1 tablet by mouth daily.    [provider]  gabapentin (NEURONTIN) 300 MG capsule Take 300 mg by mouth 2 (two) times daily. 08/02/16   [provider]  ibuprofen (ADVIL) 200 MG tablet Take 200 mg by mouth every 6 (six) hours as needed for mild pain.    [provider]  ibuprofen (ADVIL) 600 MG tablet Take 600 mg by mouth every 6 (six) hours as needed for mild pain.    [provider]  insulin glargine (LANTUS) 100 UNIT/ML injection Inject 60 Units into the skin at bedtime.    [provider]  lisinopril (ZESTRIL) 40 MG tablet Take 1 tablet (40 mg total) by mouth daily. 01/22/23   Marinda Elk, MD  metFORMIN (GLUCOPHAGE) 850 MG tablet Take 850 mg by mouth 2 (two) times daily with a meal.    [provider]  methocarbamol (ROBAXIN) 500 MG tablet Take 1 and 1/2 tablets (750 mg total) by mouth every 6 (six) hours as needed for muscle spasms. 12/11/22   Marinda Elk, MD  Multiple Vitamin (MULTIVITAMIN WITH MINERALS) TABS tablet Take 1 tablet by mouth daily.    [provider]  NOVOLOG FLEXPEN 100 UNIT/ML FlexPen Inject 20-30 Units into the skin 3 (three) times daily with meals. Per sliding scale 07/18/16   [provider]  pantoprazole (PROTONIX) 40 MG tablet TAKE 1 TABLET TWICE DAILY Patient taking differently: Take 40 mg by mouth 2 (two) times daily. 05/30/22   Tressia Danas, MD  Polyethyl Glycol-Propyl Glycol (SYSTANE FREE OP) Place 1 drop into both eyes 3 (three)  times daily.    [provider]  polyethylene glycol (MIRALAX / GLYCOLAX) 17 g packet Take 17 g by mouth daily as needed.    [provider]  rosuvastatin (CRESTOR) 10 MG tablet Take 1 tablet (10 mg total) by mouth daily. Patient taking differently: Take 10 mg by mouth every evening. 09/18/16   Corky Crafts, MD  zolpidem (AMBIEN) 10 MG tablet Take 10 mg by mouth at bedtime as needed for sleep. 10/01/15   [provider]    Physical Exam: Vitals:   05/21/23 1021 05/21/23 1023 05/21/23 1100 05/21/23 1300  BP: (!) 158/87  (!) 152/88 (!) 142/84  Pulse: 70  73 73  Resp: 18  19 18   Temp:  98.5 F (36.9 C)    TempSrc:  Oral  SpO2: 100%  97% 97%  Weight:      Height:        Physical Exam Constitutional:      General: He is not in acute distress.    Appearance: Normal appearance.  HENT:     Head: Normocephalic and atraumatic.     Mouth/Throat:     Mouth: Mucous membranes are moist.     Pharynx: Oropharynx is clear.  Eyes:     Extraocular Movements: Extraocular movements intact.     Pupils: Pupils are equal, round, and reactive to light.  Cardiovascular:     Rate and Rhythm: Normal rate and regular rhythm.     Pulses: Normal pulses.     Heart sounds: Normal heart sounds.  Pulmonary:     Effort: Pulmonary effort is normal. No respiratory distress.     Breath sounds: Normal breath sounds.  Abdominal:     General: Bowel sounds are normal. There is no distension.     Palpations: Abdomen is soft.     Tenderness: There is abdominal tenderness.  Musculoskeletal:        General: No swelling or deformity.  Skin:    General: Skin is warm and dry.  Neurological:     General: No focal deficit present.     Mental Status: Mental status is at baseline.    Labs on Admission: I have personally reviewed following labs and imaging studies  CBC: Recent Labs  Lab 05/21/23 1019  WBC 8.6  NEUTROABS 6.5  HGB 15.1  HCT 49.5  MCV 73.4*  PLT 226     Basic Metabolic Panel: Recent Labs  Lab 05/21/23 1019  NA 136  K 5.2*  CL 101  CO2 21*  GLUCOSE 181*  BUN 14  CREATININE 0.93  CALCIUM 9.5    GFR: Estimated Creatinine Clearance: 91.8 mL/min (by C-G formula based on SCr of 0.93 mg/dL).  Liver Function Tests: Recent Labs  Lab 05/21/23 1019  AST 24  ALT 23  ALKPHOS 77  BILITOT 1.1  PROT 7.7  ALBUMIN 4.0    Urine analysis:    Component Value Date/Time   COLORURINE YELLOW 05/07/2022 1344   APPEARANCEUR CLEAR 05/07/2022 1344   LABSPEC 1.024 05/07/2022 1344   PHURINE 8.0 05/07/2022 1344   GLUCOSEU >=500 (A) 05/07/2022 1344   HGBUR NEGATIVE 05/07/2022 1344   BILIRUBINUR NEGATIVE 05/07/2022 1344   KETONESUR 5 (A) 05/07/2022 1344   PROTEINUR 100 (A) 05/07/2022 1344   UROBILINOGEN 0.2 10/18/2007 1926   NITRITE NEGATIVE 05/07/2022 1344   LEUKOCYTESUR NEGATIVE 05/07/2022 1344    Radiological Exams on Admission: DG Abd Portable 1V-Small Bowel Protocol-Position Verification  Result Date: 05/21/2023 CLINICAL DATA:  NG tube placement. EXAM: PORTABLE ABDOMEN - 1 VIEW COMPARISON:  04/02/2023 FINDINGS: NG tube tip is positioned in the mid stomach. Side port of the NG tube is below the GE junction. Visualized upper abdomen demonstrates nonspecific bowel gas pattern. IMPRESSION: NG tube tip is in the mid stomach. Electronically Signed   By: Kennith Center M.D.   On: 05/21/2023 13:15   CT ABDOMEN PELVIS W CONTRAST  Result Date: 05/21/2023 CLINICAL DATA:  Severe abdominal pain with decreased ostomy output. History of multiple prior bowel obstructions. EXAM: CT ABDOMEN AND PELVIS WITH CONTRAST TECHNIQUE: Multidetector CT imaging of the abdomen and pelvis was performed using the standard protocol following bolus administration of intravenous contrast. RADIATION DOSE REDUCTION: This exam was performed according to the departmental dose-optimization program which includes automated exposure control, adjustment of the  mA and/or kV  according to patient size and/or use of iterative reconstruction technique. CONTRAST:  75mL OMNIPAQUE IOHEXOL 350 MG/ML SOLN COMPARISON:  CT abdomen pelvis dated April 01, 2023. FINDINGS: Lower chest: No acute abnormality. Hepatobiliary: No focal liver abnormality is seen. No gallstones, gallbladder wall thickening, or biliary dilatation. Pancreas: Unremarkable. No pancreatic ductal dilatation or surrounding inflammatory changes. Spleen: Normal in size without focal abnormality. Adrenals/Urinary Tract: Adrenal glands are unremarkable. Kidneys are normal, without renal calculi, focal lesion, or hydronephrosis. Bladder is unremarkable. Stomach/Bowel: The stomach is within normal limits. Multiple dilated loops of small bowel with fecalization are again noted with transition point at the small bowel anastomosis in the right anterior lower abdomen (series 3, image 61). Loops of ileum distal to the anastomosis are decompressed. Postsurgical changes from prior left hemicolectomy again noted with left-sided colostomy. Remaining colon is unremarkable. Normal appendix. Vascular/Lymphatic: Aortic atherosclerosis. No enlarged abdominal or pelvic lymph nodes. Reproductive: Prostate is unremarkable. Penile implant reservoir in the right pelvis again noted. Other: Trace interloop ascites in the right abdomen amongst the dilated small bowel loops, similar to prior study. No pneumoperitoneum. 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 Musculoskeletal: No acute or significant osseous findings. IMPRESSION: 1. Small-bowel obstruction with transition point at the small bowel anastomosis in the right anterior lower abdomen, similar to prior study. 2.  Aortic Atherosclerosis (ICD10-I70.0). Electronically Signed   By: Obie Dredge M.D.   On: 05/21/2023 12:13    EKG: Independently reviewed.  Sinus rhythm at 70 bpm.  Assessment/Plan Principal  Problem:   SBO (small bowel obstruction) (HCC) Active Problems:   History of rectal cancer with ostomy    Diabetes mellitus without complication (HCC)   Essential hypertension   Gastroesophageal reflux disease   Hyperlipidemia   Polyneuropathy due to type 2 diabetes mellitus (HCC)   Iron deficiency anemia   SBO History of rectal cancer with ostomy > Patient with recurrent abdominal pain and decreased ostomy output with imaging confirming SBO. > General surgery consulted and recommending SBO imaging protocol, NG tube, NPO. - Appreciate general surgery recommendations and assistance - N.p.o. - Imaging protocol for small bowel obstruction - As needed pain control - IV fluids  Hypertension - Holding home amlodipine, carvedilol, lisinopril n.p.o.  Hyperlipidemia - Holding home rosuvastatin while n.p.o.  GERD - Holding home PPI while n.p.o.  Diabetes > 60 units nightly, 20 to 30 units with meals at home. - 20 units nightly while NPO - SSI   DVT prophylaxis: SCDs for now Code Status:   Full Family Communication:  None on admission  Disposition Plan:   Patient is from:  Home  Anticipated DC to:  Home  Anticipated DC date:  1 to 4 days  Anticipated DC barriers: None  Consults called:  General surgery Admission status:  Observation, telemetry  Severity of Illness: The appropriate patient status for this patient is OBSERVATION. Observation status is judged to be reasonable and necessary in order to provide the required intensity of Tate to ensure the patient's safety. The patient's presenting symptoms, physical exam findings, and initial radiographic and laboratory data in the context of their medical condition is felt to place them at decreased risk for further clinical deterioration. Furthermore, it is anticipated that the patient will be medically stable for discharge from the hospital within 2 midnights of admission.    Synetta Fail MD [facility]  How to  contact the [facility] Attending or  Consulting provider 7A - 7P or covering provider during after hours 7P -7A, for this patient?   Check the care team in [facility] and look for a) attending/consulting [facility] provider listed and b) the [facility] team listed Log into [email or web address] and use [employer] universal password to access. If you do not have the password, please contact the hospital operator. Locate the [name] provider you are looking for under [facility] and page to a number that you can be directly reached. If you still have difficulty reaching the provider, please page the [facility] (Director on Call) for the Hospitalists listed on amion for assistance.  05/21/2023, 1:18 PM

## 2023-05-21 NOTE — ED Notes (Signed)
ED TO INPATIENT HANDOFF REPORT  ED Nurse Name and Phone #: [phone]  S Name/Age/Gender [name] 67 y.o. male Room/Bed: 030C/030C  Code Status   Code Status: Full Code  Home/SNF/Other Home Patient oriented to: self, place, time, and situation Is this baseline? Yes   Triage Complete: Triage complete  Chief Complaint SBO (small bowel obstruction) (HCC) [K56.609]  Triage Note Pt to the ed from home with a CC of sever abd pain with no output from his ostomy. Pt had ostomy placed 1.5 years ago and has had multiple obstructions after. Pt relays nausea without vomiting with chills. Pt denies cp, sob, dizziness loc at this time.   Allergies Allergies  Allergen Reactions   Cardizem Cd [Diltiazem Hcl Er Beads] Swelling   Toprol Xl [Metoprolol] Other (See Comments)    headache   Tricor [Fenofibrate] Other (See Comments)    Constipation     Level of Care/Admitting Diagnosis ED Disposition     ED Disposition  Admit   Condition  --   Comment  Hospital Area: [facility] [[id number]]  Level of Care: Telemetry Surgical [[id number]]  May place patient in observation at [facility] or [facility] if equivalent level of care is available:: No  Covid Evaluation: Asymptomatic - no recent exposure (last 10 days) testing not required  Diagnosis: SBO (small bowel obstruction) [facility]) [[id number]]  Admitting Physician: Synetta Fail [[id number]]  Attending Physician: Synetta Fail [[id number]]          B Medical/Surgery History Past Medical History:  Diagnosis Date   Coronary artery disease    DDD (degenerative disc disease), lumbar    Diabetes mellitus without complication (HCC)    Dyslipidemia    History of rectal cancer    dx 2006 --  Stage III, T3  N1  M0,  s/p  chemoradiation (07-10-2005 to 09-05-2005) and anterior colon resection 11-04-2006   History of shingles    Hyperlipidemia    Hypertension    Iron deficiency anemia    intolerent PO iron--  gets  IV iron,  last one 03-12-2015 ([facility])   Organic impotence    Rectal cancer (HCC)    adenocarcinoma stage lll   Past Surgical History:  Procedure Laterality Date   ANAL RECTAL MANOMETRY N/A 09/04/2021   Procedure: ANO RECTAL MANOMETRY;  Surgeon: Napoleon Form, MD;  Location: [facility];  Service: Endoscopy;  Laterality: N/A;   ANTERIOR COLON RESECTION W/ COLOSTOMY  11-04-2006   CATARACT EXTRACTION W/ INTRAOCULAR LENS IMPLANT Right sept 2015  &  feb 2016   COLONOSCOPY WITH ESOPHAGOGASTRODUODENOSCOPY (EGD)  last one 03-07-2010   COLONOSCOPY WITH PROPOFOL N/A 10/23/2015   Procedure: COLONOSCOPY WITH PROPOFOL;  Surgeon: Charolett Bumpers, MD;  Location: [facility];  Service: Endoscopy;  Laterality: N/A;   COLOSTOMY TAKEDOWN  june 2007   EXAMINATION UNDER ANESTHESIA N/A 04/23/2015   Procedure: EXAM UNDER ANESTHESIA, MANIPULATION OF PENILE PROSTHESIS PUMP;  Surgeon: Ihor Gully, MD;  Location: [facility];  Service: Urology;  Laterality: N/A;   PENILE PROSTHESIS PLACEMENT  10-29-2009   PORT A CATH PLACEMENT  07-23-2005   prolapsed hemorrhoid       A IV Location/Drains/Wounds Patient Lines/Drains/Airways Status     Active Line/Drains/Airways     Name Placement date Placement time Site Days   Peripheral IV 05/21/23 Left Antecubital 05/21/23  1047  Antecubital  less than 1   NG/OG Vented/Dual Lumen Right nare 55 cm 05/21/23  1259  Right nare  less than 1   Colostomy LUQ --  --  LUQ  --            Intake/Output Last 24 hours No intake or output data in the 24 hours ending 05/21/23 1320  Labs/Imaging Results for orders placed or performed during the hospital encounter of 05/21/23 (from the past 48 hour(s))  CBC with Differential     Status: Abnormal   Collection Time: 05/21/23 10:19 AM  Result Value Ref Range   WBC 8.6 4.0 - 10.5 K/uL   RBC 6.74 (H) 4.22 - 5.81 MIL/uL   Hemoglobin 15.1 13.0 - 17.0 g/dL   HCT 96.0 45.4 - 09.8 %   MCV 73.4 (L) 80.0 - 100.0  fL   MCH 22.4 (L) 26.0 - 34.0 pg   MCHC 30.5 30.0 - 36.0 g/dL   RDW 11.9 (H) 14.7 - 82.9 %   Platelets 226 150 - 400 K/uL   nRBC 0.0 0.0 - 0.2 %   Neutrophils Relative % 75 %   Neutro Abs 6.5 1.7 - 7.7 K/uL   Lymphocytes Relative 19 %   Lymphs Abs 1.6 0.7 - 4.0 K/uL   Monocytes Relative 4 %   Monocytes Absolute 0.3 0.1 - 1.0 K/uL   Eosinophils Relative 1 %   Eosinophils Absolute 0.1 0.0 - 0.5 K/uL   Basophils Relative 1 %   Basophils Absolute 0.1 0.0 - 0.1 K/uL   Immature Granulocytes 0 %   Abs Immature Granulocytes 0.03 0.00 - 0.07 K/uL    Comment: Performed at [facility], [address] [place], [place] [address]  Comprehensive metabolic panel     Status: Abnormal   Collection Time: 05/21/23 10:19 AM  Result Value Ref Range   Sodium 136 135 - 145 mmol/L   Potassium 5.2 (H) 3.5 - 5.1 mmol/L   Chloride 101 98 - 111 mmol/L   CO2 21 (L) 22 - 32 mmol/L   Glucose, Bld 181 (H) 70 - 99 mg/dL    Comment: Glucose reference range applies only to samples taken after fasting for at least 8 hours.   BUN 14 8 - 23 mg/dL   Creatinine, Ser 0.86 0.61 - 1.24 mg/dL   Calcium 9.5 8.9 - 57.8 mg/dL   Total Protein 7.7 6.5 - 8.1 g/dL   Albumin 4.0 3.5 - 5.0 g/dL   AST 24 15 - 41 U/L   ALT 23 0 - 44 U/L   Alkaline Phosphatase 77 38 - 126 U/L   Total Bilirubin 1.1 0.3 - 1.2 mg/dL   GFR, Estimated >46 >96 mL/min    Comment: (NOTE) Calculated using the CKD-EPI Creatinine Equation (2021)    Anion gap 14 5 - 15    Comment: Performed at [facility], [address] [place], [place] [address]  Lipase, blood     Status: None   Collection Time: 05/21/23 10:19 AM  Result Value Ref Range   Lipase 28 11 - 51 U/L    Comment: Performed at [facility], [address], [place], [place] [address]   DG Abd Portable 1V-Small Bowel Protocol-Position Verification  Result Date: 05/21/2023 CLINICAL DATA:  NG tube placement. EXAM: PORTABLE ABDOMEN - 1 VIEW COMPARISON:  04/02/2023 FINDINGS:  NG tube tip is positioned in the mid stomach. Side port of the NG tube is below the GE junction. Visualized upper abdomen demonstrates nonspecific bowel gas pattern. IMPRESSION: NG tube tip is in the mid stomach. Electronically Signed   By:  Kennith Center M.D.   On: 05/21/2023 13:15   CT ABDOMEN PELVIS W CONTRAST  Result Date: 05/21/2023 CLINICAL DATA:  Severe abdominal pain with decreased ostomy output. History of multiple prior bowel obstructions. EXAM: CT ABDOMEN AND PELVIS WITH CONTRAST TECHNIQUE: Multidetector CT imaging of the abdomen and pelvis was performed using the standard protocol following bolus administration of intravenous contrast. RADIATION DOSE REDUCTION: This exam was performed according to the departmental dose-optimization program which includes automated exposure control, adjustment of the mA and/or kV according to patient size and/or use of iterative reconstruction technique. CONTRAST:  75mL OMNIPAQUE IOHEXOL 350 MG/ML SOLN COMPARISON:  CT abdomen pelvis dated April 01, 2023. FINDINGS: Lower chest: No acute abnormality. Hepatobiliary: No focal liver abnormality is seen. No gallstones, gallbladder wall thickening, or biliary dilatation. Pancreas: Unremarkable. No pancreatic ductal dilatation or surrounding inflammatory changes. Spleen: Normal in size without focal abnormality. Adrenals/Urinary Tract: Adrenal glands are unremarkable. Kidneys are normal, without renal calculi, focal lesion, or hydronephrosis. Bladder is unremarkable. Stomach/Bowel: The stomach is within normal limits. Multiple dilated loops of small bowel with fecalization are again noted with transition point at the small bowel anastomosis in the right anterior lower abdomen (series 3, image 61). Loops of ileum distal to the anastomosis are decompressed. Postsurgical changes from prior left hemicolectomy again noted with left-sided colostomy. Remaining colon is unremarkable. Normal appendix. Vascular/Lymphatic: Aortic  atherosclerosis. No enlarged abdominal or pelvic lymph nodes. Reproductive: Prostate is unremarkable. Penile implant reservoir in the right pelvis again noted. Other: Trace interloop ascites in the right abdomen amongst the dilated small bowel loops, similar to prior study. No pneumoperitoneum. 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 Musculoskeletal: No acute or significant osseous findings. IMPRESSION: 1. Small-bowel obstruction with transition point at the small bowel anastomosis in the right anterior lower abdomen, similar to prior study. 2.  Aortic Atherosclerosis (ICD10-I70.0). Electronically Signed   By: Obie Dredge M.D.   On: 05/21/2023 12:13    Pending Labs Unresulted Labs (From admission, onward)     Start     Ordered   05/22/23 0500  Basic metabolic panel  Tomorrow morning,   R        05/21/23 1318   05/22/23 0500  CBC  Tomorrow morning,   R        05/21/23 1318   05/21/23 1316  HIV Antibody (routine testing w rflx)  (HIV Antibody (Routine testing w reflex) panel)  Once,   R        05/21/23 1318   05/21/23 1019  Urinalysis, Routine w reflex microscopic -Urine, Clean Catch  (ED Abdominal Pain)  Once,   URGENT       Question:  Specimen Source  Answer:  Urine, Clean Catch   05/21/23 1018            Vitals/Pain Today's Vitals   05/21/23 1021 05/21/23 1023 05/21/23 1100 05/21/23 1300  BP: (!) 158/87  (!) 152/88 (!) 142/84  Pulse: 70  73 73  Resp: 18  19 18   Temp:  98.5 F (36.9 C)    TempSrc:  Oral    SpO2: 100%  97% 97%  Weight:      Height:      PainSc:        Isolation Precautions No active isolations  Medications Medications  diatrizoate meglumine-sodium (GASTROGRAFIN) 66-10 % solution 90 mL (has no administration in time range)  ondansetron (ZOFRAN) injection 4 mg (has  no administration in time range)  sodium chloride flush (NS) 0.9 % injection 3 mL (has no  administration in time range)  0.9 %  sodium chloride infusion (has no administration in time range)  acetaminophen (TYLENOL) tablet 650 mg (has no administration in time range)    Or  acetaminophen (TYLENOL) suppository 650 mg (has no administration in time range)  morphine (PF) 4 MG/ML injection 4 mg (4 mg Intravenous Given 05/21/23 1049)  ondansetron (ZOFRAN) injection 4 mg (4 mg Intravenous Given 05/21/23 1049)  lactated ringers bolus 1,000 mL (1,000 mLs Intravenous New Bag/Given 05/21/23 1050)  iohexol (OMNIPAQUE) 350 MG/ML injection 75 mL (75 mLs Intravenous Contrast Given 05/21/23 1151)  morphine (PF) 4 MG/ML injection 4 mg (4 mg Intravenous Given 05/21/23 1249)    Mobility walks     Focused Assessments Cardiac Assessment Handoff:    No results found for: "CKTOTAL", "CKMB", "CKMBINDEX", "TROPONINI" Lab Results  Component Value Date   DDIMER 4.48 (H) 12/09/2022   Does the Patient currently have chest pain? No    R Recommendations: See Admitting Provider Note  Report given to:   Additional Notes:

## 2023-05-22 ENCOUNTER — Observation Stay (HOSPITAL_COMMUNITY): Payer: Medicare HMO

## 2023-05-22 ENCOUNTER — Inpatient Hospital Stay (HOSPITAL_COMMUNITY): Payer: Medicare HMO

## 2023-05-22 DIAGNOSIS — Z888 Allergy status to other drugs, medicaments and biological substances status: Secondary | ICD-10-CM | POA: Diagnosis not present

## 2023-05-22 DIAGNOSIS — Z961 Presence of intraocular lens: Secondary | ICD-10-CM | POA: Diagnosis present

## 2023-05-22 DIAGNOSIS — E785 Hyperlipidemia, unspecified: Secondary | ICD-10-CM | POA: Diagnosis present

## 2023-05-22 DIAGNOSIS — I10 Essential (primary) hypertension: Secondary | ICD-10-CM | POA: Diagnosis not present

## 2023-05-22 DIAGNOSIS — E1140 Type 2 diabetes mellitus with diabetic neuropathy, unspecified: Secondary | ICD-10-CM | POA: Diagnosis present

## 2023-05-22 DIAGNOSIS — I129 Hypertensive chronic kidney disease with stage 1 through stage 4 chronic kidney disease, or unspecified chronic kidney disease: Secondary | ICD-10-CM | POA: Diagnosis present

## 2023-05-22 DIAGNOSIS — K435 Parastomal hernia without obstruction or  gangrene: Secondary | ICD-10-CM | POA: Diagnosis present

## 2023-05-22 DIAGNOSIS — D509 Iron deficiency anemia, unspecified: Secondary | ICD-10-CM | POA: Diagnosis present

## 2023-05-22 DIAGNOSIS — N529 Male erectile dysfunction, unspecified: Secondary | ICD-10-CM | POA: Diagnosis present

## 2023-05-22 DIAGNOSIS — Z794 Long term (current) use of insulin: Secondary | ICD-10-CM | POA: Diagnosis not present

## 2023-05-22 DIAGNOSIS — E875 Hyperkalemia: Secondary | ICD-10-CM | POA: Diagnosis present

## 2023-05-22 DIAGNOSIS — Z8619 Personal history of other infectious and parasitic diseases: Secondary | ICD-10-CM | POA: Diagnosis not present

## 2023-05-22 DIAGNOSIS — Z8249 Family history of ischemic heart disease and other diseases of the circulatory system: Secondary | ICD-10-CM | POA: Diagnosis not present

## 2023-05-22 DIAGNOSIS — Z933 Colostomy status: Secondary | ICD-10-CM | POA: Diagnosis not present

## 2023-05-22 DIAGNOSIS — N181 Chronic kidney disease, stage 1: Secondary | ICD-10-CM | POA: Diagnosis present

## 2023-05-22 DIAGNOSIS — Z833 Family history of diabetes mellitus: Secondary | ICD-10-CM | POA: Diagnosis not present

## 2023-05-22 DIAGNOSIS — E119 Type 2 diabetes mellitus without complications: Secondary | ICD-10-CM | POA: Diagnosis not present

## 2023-05-22 DIAGNOSIS — E1122 Type 2 diabetes mellitus with diabetic chronic kidney disease: Secondary | ICD-10-CM | POA: Diagnosis present

## 2023-05-22 DIAGNOSIS — K56609 Unspecified intestinal obstruction, unspecified as to partial versus complete obstruction: Secondary | ICD-10-CM | POA: Diagnosis not present

## 2023-05-22 DIAGNOSIS — I2510 Atherosclerotic heart disease of native coronary artery without angina pectoris: Secondary | ICD-10-CM | POA: Diagnosis present

## 2023-05-22 DIAGNOSIS — R109 Unspecified abdominal pain: Secondary | ICD-10-CM | POA: Diagnosis present

## 2023-05-22 DIAGNOSIS — Z923 Personal history of irradiation: Secondary | ICD-10-CM | POA: Diagnosis not present

## 2023-05-22 DIAGNOSIS — Z9221 Personal history of antineoplastic chemotherapy: Secondary | ICD-10-CM | POA: Diagnosis not present

## 2023-05-22 DIAGNOSIS — Z85048 Personal history of other malignant neoplasm of rectum, rectosigmoid junction, and anus: Secondary | ICD-10-CM | POA: Diagnosis not present

## 2023-05-22 DIAGNOSIS — Z9841 Cataract extraction status, right eye: Secondary | ICD-10-CM | POA: Diagnosis not present

## 2023-05-22 DIAGNOSIS — K219 Gastro-esophageal reflux disease without esophagitis: Secondary | ICD-10-CM | POA: Diagnosis present

## 2023-05-22 DIAGNOSIS — M5136 Other intervertebral disc degeneration, lumbar region: Secondary | ICD-10-CM | POA: Diagnosis present

## 2023-05-22 LAB — BASIC METABOLIC PANEL
Anion gap: 12 (ref 5–15)
BUN: 15 mg/dL (ref 8–23)
CO2: 23 mmol/L (ref 22–32)
Calcium: 8.9 mg/dL (ref 8.9–10.3)
Chloride: 102 mmol/L (ref 98–111)
Creatinine, Ser: 0.81 mg/dL (ref 0.61–1.24)
GFR, Estimated: >60 mL/min (ref >60)
Glucose, Bld: 179 mg/dL — ABNORMAL HIGH (ref 70–99)
Potassium: 4.2 mmol/L (ref 3.5–5.1)
Sodium: 137 mmol/L (ref 135–145)

## 2023-05-22 LAB — CBC
HCT: 48.1 % (ref 39.0–52.0)
Hemoglobin: 14.7 g/dL (ref 13.0–17.0)
MCH: 22.2 pg — ABNORMAL LOW (ref 26.0–34.0)
MCHC: 30.6 g/dL (ref 30.0–36.0)
MCV: 72.8 fL — ABNORMAL LOW (ref 80.0–100.0)
Platelets: 244 10*3/uL (ref 150–400)
RBC: 6.61 MIL/uL — ABNORMAL HIGH (ref 4.22–5.81)
RDW: 22.3 % — ABNORMAL HIGH (ref 11.5–15.5)
WBC: 7.7 10*3/uL (ref 4.0–10.5)
nRBC: 0 % (ref 0.0–0.2)

## 2023-05-22 LAB — GLUCOSE, CAPILLARY
Glucose-Capillary: 183 mg/dL — ABNORMAL HIGH (ref 70–99)
Glucose-Capillary: 155 mg/dL — ABNORMAL HIGH (ref 70–99)
Glucose-Capillary: 147 mg/dL — ABNORMAL HIGH (ref 70–99)
Glucose-Capillary: 118 mg/dL — ABNORMAL HIGH (ref 70–99)
Glucose-Capillary: 150 mg/dL — ABNORMAL HIGH (ref 70–99)

## 2023-05-22 MED ORDER — SORBITOL 70 % SOLN
960.0000 mL | TOPICAL_OIL | Freq: Once | ORAL | Status: AC
  Administered 2023-05-22: 960 mL via RECTAL
  Filled 2023-05-22: qty 240

## 2023-05-22 MED ORDER — PANTOPRAZOLE SODIUM 40 MG IV SOLR
40.0000 mg | Freq: Two times a day (BID) | INTRAVENOUS | Status: DC
  Administered 2023-05-22 – 2023-05-23 (×3): 40 mg via INTRAVENOUS
  Filled 2023-05-22 (×3): qty 10

## 2023-05-22 MED ORDER — SODIUM CHLORIDE 0.9 % IV SOLN: INTRAVENOUS | Status: DC

## 2023-05-22 MED ORDER — KETOROLAC TROMETHAMINE 15 MG/ML IJ SOLN
15.0000 mg | Freq: Three times a day (TID) | INTRAMUSCULAR | Status: DC
  Administered 2023-05-22 – 2023-05-23 (×4): 15 mg via INTRAVENOUS
  Filled 2023-05-22 (×6): qty 1

## 2023-05-22 MED ORDER — ENOXAPARIN SODIUM 40 MG/0.4ML IJ SOSY: 40.0000 mg | PREFILLED_SYRINGE | Freq: Every day | INTRAMUSCULAR | Status: DC

## 2023-05-22 MED ORDER — ACETAMINOPHEN 10 MG/ML IV SOLN
1000.0000 mg | Freq: Four times a day (QID) | INTRAVENOUS | Status: AC
  Administered 2023-05-22 – 2023-05-23 (×3): 1000 mg via INTRAVENOUS
  Filled 2023-05-22 (×3): qty 100

## 2023-05-22 MED ORDER — HYDRALAZINE HCL 20 MG/ML IJ SOLN: 10.0000 mg | Freq: Four times a day (QID) | INTRAMUSCULAR | Status: DC | PRN

## 2023-05-22 MED ORDER — ENOXAPARIN SODIUM 60 MG/0.6ML IJ SOSY: 50.0000 mg | PREFILLED_SYRINGE | Freq: Every day | INTRAMUSCULAR | Status: DC

## 2023-05-22 MED ORDER — ENOXAPARIN SODIUM 60 MG/0.6ML IJ SOSY
50.0000 mg | PREFILLED_SYRINGE | Freq: Every day | INTRAMUSCULAR | Status: DC
  Administered 2023-05-22 – 2023-05-23 (×2): 50 mg via SUBCUTANEOUS
  Filled 2023-05-22 (×2): qty 0.6

## 2023-05-22 NOTE — Progress Notes (Signed)
Progress Note     Subjective: Patient complaining of more pain and asking for decrease in interval between pain medication dosing. Describes pain as a rolling or cramping and feels like it is probably gas related. No output from stoma yet. Feeling weak and tired this AM.   Objective: Vital signs in last 24 hours: Temp:  [98 F (36.7 C)-98.5 F (36.9 C)] 98.3 F (36.8 C) (05/24 0729) Pulse Rate:  [70-81] 79 (05/24 0729) Resp:  [16-19] 16 (05/24 0729) BP: (134-158)/(65-88) 134/65 (05/24 0729) SpO2:  [90 %-100 %] 92 % (05/24 0729) Weight:  [101 kg] 101 kg (05/23 1019)    Intake/Output from previous day: 05/23 0701 - 05/24 0700 In: -  Out: 1625 [Urine:800; Emesis/NG output:825] Intake/Output this shift: No intake/output data recorded.  PE: General: pleasant, WD, obese male who is laying in bed in NAD Heart: regular, rate, and rhythm.  Lungs: Respiratory effort nonlabored Abd: soft, mild ttp without peritonitis, mild distention, +BS, stoma viable in LLQ, multiple surgical scars, NGT with thin bile tinged fluid  Psych: A&Ox3 with an appropriate affect.   Lab Results:  Recent Labs    05/21/23 1019 05/22/23 0145  WBC 8.6 7.7  HGB 15.1 14.7  HCT 49.5 48.1  PLT 226 244   BMET Recent Labs    05/21/23 1019 05/22/23 0145  NA 136 137  K 5.2* 4.2  CL 101 102  CO2 21* 23  GLUCOSE 181* 179*  BUN 14 15  CREATININE 0.93 0.81  CALCIUM 9.5 8.9   PT/INR No results for input(s): "LABPROT", "INR" in the last 72 hours. CMP     Component Value Date/Time   NA 137 05/22/2023 0145   K 4.2 05/22/2023 0145   CL 102 05/22/2023 0145   CO2 23 05/22/2023 0145   GLUCOSE 179 (H) 05/22/2023 0145   BUN 15 05/22/2023 0145   CREATININE 0.81 05/22/2023 0145   CALCIUM 8.9 05/22/2023 0145   PROT 7.7 05/21/2023 1019   ALBUMIN 4.0 05/21/2023 1019   AST 24 05/21/2023 1019   ALT 23 05/21/2023 1019   ALKPHOS 77 05/21/2023 1019   BILITOT 1.1 05/21/2023 1019   GFRNONAA >60 05/22/2023  0145   GFRAA  03/03/2010 1455    >60        The eGFR has been calculated using the MDRD equation. This calculation has not been validated in all clinical situations. eGFR's persistently <60 mL/min signify possible Chronic Kidney Disease.   Lipase     Component Value Date/Time   LIPASE 28 05/21/2023 1019       Studies/Results: DG Abd Portable 1V-Small Bowel Obstruction Protocol-initial, 8 hr delay  Result Date: 05/22/2023 CLINICAL DATA:  Small bowel obstruction. EXAM: PORTABLE ABDOMEN - 1 VIEW COMPARISON:  05/21/2023 FINDINGS: Fluid-filled/opacified small bowel loops are seen in the upper abdomen. Lack of gas filled loops limits assessment for degree of distension. Prominent stool volume evident right colon. Evidence of prior left ventral mesh placement. Bones are diffusely demineralized. IMPRESSION: Fluid-filled/opacified small bowel loops in the upper abdomen. Lack of gas filled loops limits assessment for degree of distension. Electronically Signed   By: Kennith Center M.D.   On: 05/22/2023 06:23   DG Abd Portable 1V-Small Bowel Protocol-Position Verification  Result Date: 05/21/2023 CLINICAL DATA:  NG tube placement. EXAM: PORTABLE ABDOMEN - 1 VIEW COMPARISON:  04/02/2023 FINDINGS: NG tube tip is positioned in the mid stomach. Side port of the NG tube is below the GE junction. Visualized upper abdomen demonstrates nonspecific  bowel gas pattern. IMPRESSION: NG tube tip is in the mid stomach. Electronically Signed   By: Kennith Center M.D.   On: 05/21/2023 13:15   CT ABDOMEN PELVIS W CONTRAST  Result Date: 05/21/2023 CLINICAL DATA:  Severe abdominal pain with decreased ostomy output. History of multiple prior bowel obstructions. EXAM: CT ABDOMEN AND PELVIS WITH CONTRAST TECHNIQUE: Multidetector CT imaging of the abdomen and pelvis was performed using the standard protocol following bolus administration of intravenous contrast. RADIATION DOSE REDUCTION: This exam was performed according  to the departmental dose-optimization program which includes automated exposure control, adjustment of the mA and/or kV according to patient size and/or use of iterative reconstruction technique. CONTRAST:  75mL OMNIPAQUE IOHEXOL 350 MG/ML SOLN COMPARISON:  CT abdomen pelvis dated April 01, 2023. FINDINGS: Lower chest: No acute abnormality. Hepatobiliary: No focal liver abnormality is seen. No gallstones, gallbladder wall thickening, or biliary dilatation. Pancreas: Unremarkable. No pancreatic ductal dilatation or surrounding inflammatory changes. Spleen: Normal in size without focal abnormality. Adrenals/Urinary Tract: Adrenal glands are unremarkable. Kidneys are normal, without renal calculi, focal lesion, or hydronephrosis. Bladder is unremarkable. Stomach/Bowel: The stomach is within normal limits. Multiple dilated loops of small bowel with fecalization are again noted with transition point at the small bowel anastomosis in the right anterior lower abdomen (series 3, image 61). Loops of ileum distal to the anastomosis are decompressed. Postsurgical changes from prior left hemicolectomy again noted with left-sided colostomy. Remaining colon is unremarkable. Normal appendix. Vascular/Lymphatic: Aortic atherosclerosis. No enlarged abdominal or pelvic lymph nodes. Reproductive: Prostate is unremarkable. Penile implant reservoir in the right pelvis again noted. Other: Trace interloop ascites in the right abdomen amongst the dilated small bowel loops, similar to prior study. No pneumoperitoneum. 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 Musculoskeletal: No acute or significant osseous findings. IMPRESSION: 1. Small-bowel obstruction with transition point at the small bowel anastomosis in the right anterior lower abdomen, similar to prior study. 2.  Aortic Atherosclerosis (ICD10-I70.0). Electronically Signed   By: Obie Dredge  M.D.   On: 05/21/2023 12:13    Anti-infectives: Anti-infectives (From admission, onward)    None        Assessment/Plan  SBO - multiple prior abdominal surgeries, most recently in [place], [place] vs [facility]  - CT 5/23 with sbo with transition in RLQ at the level of previous small bowel anastomosis, same noted on CT 4/3  - abdomen soft but patient complaining of increased pain, does not appear worse with palpation, no peritonitis  - SBO protocol started yesterday, 8h delay with no contrast noted but stool filled right colon - WOC consult for stomal enema today and monitor closely  - add IV tylenol and low dose toradol for pain control since increased narcotic will only worsen constipation  - mobilize as able  - no indication for emergent surgical intervention at this time    FEN: NPO, IVF, NGT to LIWS VTE: ok to have LMWH or SQH from surgery standpoint ID: none indicated    LOS: 0 days   I reviewed nursing notes, hospitalist notes, last 24 h vitals and pain scores, last 48 h intake and output, last 24 h labs and trends, and last 24 h imaging results.    Juliet Rude, [facility] 05/22/2023, 8:34 AM Please see Amion for pager number during day hours 7:00am-4:30pm

## 2023-05-22 NOTE — Progress Notes (Signed)
PROGRESS NOTE    Phillip Tate  ZOX:[id number] DOB: 1956/10/30 DOA: 05/21/2023 PCP: Georgann Housekeeper, MD    Brief Narrative:  Phillip Tate is a 67 y.o. male with medical history significant of hypertension, hyperlipidemia, neuropathy, diabetes mellitus, anemia, GERD, CKD 1, CAD, rectal cancer status post surgery and colostomy recent hospital with abdominal pain and decreased ostomy output.  Patient has history of small bowel obstructions in the past which has been recurrent since his surgery for rectal cancer in the stomach.  In the ED, blood pressure was slightly elevated.  Labs showed hypokalemia with potassium of 5.2.  Lipase was negative.  CT abdomen pelvis showed small bowel obstruction with transition point.  Patient was then considered for admission to the hospital for further evaluation and treatment.  Assessment and plan.  SBO, History of rectal cancer with ostomy Patient with history of recurrent small bowel obstruction in the past.  History of multiple prior abdominal surgeries.  CT scan showing SBO with transition in the right lower quadrant.  General surgery on board.  Currently n.p.o. IV fluids NG tube.  Surgery following imaging protocol for small bowel obstruction.  Stomal enema ordered by surgery today.  Follow surgical recommendations.  Prior to avoid narcotics if possible and work with Toradol.   Hypertension Currently holding home amlodipine, carvedilol, lisinopril.  Add as needed hydralazine.   Hyperlipidemia Hold Crestor.   GERD On oral Protonix at home.  Changed to IV PPI.   Diabetes mellitus type II, insulin-dependent Patient is on active 60 units nightly, 20 to 30 units with meals at home.  Continue 20 units nightly while NPO with sliding scale insulin every 4 hourly for now.  Will closely monitor for hypoglycemia.    DVT prophylaxis: SCDs Start: 05/21/23 1316, and Lovenox subcu.   Code Status:     Code Status: Full Code  Disposition: Likely  home in 2 to 3 days  Status is: Observation  The patient will require care spanning > 2 midnights and should be moved to inpatient because: Small bowel obstruction, NG tube, n.p.o. status, IV fluids, pending clinical improvement   Family Communication: None at bedside  Consultants:  General surgery  Procedures:  NG-tube placement  Antimicrobials:  None  Anti-infectives (From admission, onward)    None      Subjective: Today, patient was seen and examined at bedside.  Complains of intermittent moderate to severe abdominal pain mildly relieved with medication.  No nausea vomiting but has NG tube in place.  Has not had any bowel movement or gas in his colostomy.  Complains of fatigue and weakness.  Objective: Vitals:   05/21/23 1516 05/21/23 2002 05/22/23 0436 05/22/23 0729  BP: (!) 152/82 136/73 (!) 141/72 134/65  Pulse: 70 76 81 79  Resp: 19 18 18 16   Temp: 98.1 F (36.7 C) 98.3 F (36.8 C) 98 F (36.7 C) 98.3 F (36.8 C)  TempSrc: Oral Oral Oral Oral  SpO2: 98% 97% 90% 92%  Weight:      Height:        Intake/Output Summary (Last 24 hours) at 05/22/2023 0944 Last data filed at 05/22/2023 0700 Gross per 24 hour  Intake --  Output 1625 ml  Net -1625 ml   Filed Weights   05/21/23 1019  Weight: 101 kg    Physical Examination: Body mass index is 31.95 kg/m.   General: Obese built, not in obvious distress HENT:   No scleral pallor or icterus noted. Oral mucosa is moist.  NG tube in place Chest:  Clear breath sounds.  . No crackles or wheezes.  CVS: S1 &S2 heard. No murmur.  Regular rate and rhythm. Abdomen: Soft, nonspecific tenderness on palpation.  Mildly distended abdomen, midline scar, lower quadrant stoma without gas or stool.   Extremities: No cyanosis, clubbing or edema.  Peripheral pulses are palpable. Psych: Alert, awake and oriented, normal mood CNS:  No cranial nerve deficits.  Power equal in all extremities.   Skin: Warm and dry.  No rashes  noted.  Data Reviewed:   CBC: Recent Labs  Lab 05/21/23 1019 05/22/23 0145  WBC 8.6 7.7  NEUTROABS 6.5  --   HGB 15.1 14.7  HCT 49.5 48.1  MCV 73.4* 72.8*  PLT 226 244    Basic Metabolic Panel: Recent Labs  Lab 05/21/23 1019 05/22/23 0145  NA 136 137  K 5.2* 4.2  CL 101 102  CO2 21* 23  GLUCOSE 181* 179*  BUN 14 15  CREATININE 0.93 0.81  CALCIUM 9.5 8.9    Liver Function Tests: Recent Labs  Lab 05/21/23 1019  AST 24  ALT 23  ALKPHOS 77  BILITOT 1.1  PROT 7.7  ALBUMIN 4.0     Radiology Studies: DG Abd Portable 1V-Small Bowel Obstruction Protocol-initial, 8 hr delay  Result Date: 05/22/2023 CLINICAL DATA:  Small bowel obstruction. EXAM: PORTABLE ABDOMEN - 1 VIEW COMPARISON:  05/21/2023 FINDINGS: Fluid-filled/opacified small bowel loops are seen in the upper abdomen. Lack of gas filled loops limits assessment for degree of distension. Prominent stool volume evident right colon. Evidence of prior left ventral mesh placement. Bones are diffusely demineralized. IMPRESSION: Fluid-filled/opacified small bowel loops in the upper abdomen. Lack of gas filled loops limits assessment for degree of distension. Electronically Signed   By: Kennith Center M.D.   On: 05/22/2023 06:23   DG Abd Portable 1V-Small Bowel Protocol-Position Verification  Result Date: 05/21/2023 CLINICAL DATA:  NG tube placement. EXAM: PORTABLE ABDOMEN - 1 VIEW COMPARISON:  04/02/2023 FINDINGS: NG tube tip is positioned in the mid stomach. Side port of the NG tube is below the GE junction. Visualized upper abdomen demonstrates nonspecific bowel gas pattern. IMPRESSION: NG tube tip is in the mid stomach. Electronically Signed   By: Kennith Center M.D.   On: 05/21/2023 13:15   CT ABDOMEN PELVIS W CONTRAST  Result Date: 05/21/2023 CLINICAL DATA:  Severe abdominal pain with decreased ostomy output. History of multiple prior bowel obstructions. EXAM: CT ABDOMEN AND PELVIS WITH CONTRAST TECHNIQUE: Multidetector  CT imaging of the abdomen and pelvis was performed using the standard protocol following bolus administration of intravenous contrast. RADIATION DOSE REDUCTION: This exam was performed according to the departmental dose-optimization program which includes automated exposure control, adjustment of the mA and/or kV according to patient size and/or use of iterative reconstruction technique. CONTRAST:  75mL OMNIPAQUE IOHEXOL 350 MG/ML SOLN COMPARISON:  CT abdomen pelvis dated April 01, 2023. FINDINGS: Lower chest: No acute abnormality. Hepatobiliary: No focal liver abnormality is seen. No gallstones, gallbladder wall thickening, or biliary dilatation. Pancreas: Unremarkable. No pancreatic ductal dilatation or surrounding inflammatory changes. Spleen: Normal in size without focal abnormality. Adrenals/Urinary Tract: Adrenal glands are unremarkable. Kidneys are normal, without renal calculi, focal lesion, or hydronephrosis. Bladder is unremarkable. Stomach/Bowel: The stomach is within normal limits. Multiple dilated loops of small bowel with fecalization are again noted with transition point at the small bowel anastomosis in the right anterior lower abdomen (series 3, image 61). Loops of ileum distal to the anastomosis  are decompressed. Postsurgical changes from prior left hemicolectomy again noted with left-sided colostomy. Remaining colon is unremarkable. Normal appendix. Vascular/Lymphatic: Aortic atherosclerosis. No enlarged abdominal or pelvic lymph nodes. Reproductive: Prostate is unremarkable. Penile implant reservoir in the right pelvis again noted. Other: Trace interloop ascites in the right abdomen amongst the dilated small bowel loops, similar to prior study. No pneumoperitoneum. 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 Musculoskeletal: No acute or significant osseous findings. IMPRESSION: 1. Small-bowel obstruction with  transition point at the small bowel anastomosis in the right anterior lower abdomen, similar to prior study. 2.  Aortic Atherosclerosis (ICD10-I70.0). Electronically Signed   By: Obie Dredge M.D.   On: 05/21/2023 12:13      LOS: 0 days    Joycelyn Das, MD [facility] Available via Epic secure chat 7am-7pm After these hours, please refer to coverage provider listed on [email or web address] 05/22/2023, 9:44 AM

## 2023-05-22 NOTE — Plan of Care (Signed)

## 2023-05-22 NOTE — Consult Note (Signed)
WOC Nurse ostomy consult note; patient with well established colostomy admitted for small bowel obstruction  Stoma type/location: LLQ colostomy  Stomal assessment/size: 1 3/8" pink moist budded  Peristomal assessment: intact  Treatment options for stomal/peristomal skin:  2" barrier ring  Output  no output in 1 piece patient wearing from home  Ostomy pouching: placed in a 2 3/4" skin barrier and pouch after irrigation  Education provided: none, established ostomy.   Did discuss steps to [facility] enema.   SMOG enema administered per MD order.  After approximately 500 mls patient began having formed stool come out of stoma into irrigation sleeve.  Continued to administer rest of contents of enema and patient continued to have more stool from ostomy.  After entire contents had been administered patient was noted to have soft stool coming from ostomy.  We did snap a regular 2 3/4" pouch to the barrier ring after removing the irrigation sleeve. I cleaned the irrigation sleeve and placed back in box and left in room in case this procedure needed to be performed again.    Patient very encouraged by the amount of stool we were able to remove with the enema.  Did send secure chat to CCS and bedside nurse with results.    WOC team will not follow.  Re-consult if further needs arise.   Thank you,    Priscella Mann MSN, RN-BC, [employer] [phone]

## 2023-05-22 NOTE — Progress Notes (Signed)
Patient requesting Ng removed and clears after having 2 large outputs from ostomy and gas. -remove NG -clear liquids

## 2023-05-22 NOTE — Plan of Care (Signed)
  Problem: Health Behavior/Discharge Planning: Goal: Ability to manage health-related needs will improve Outcome: Progressing   

## 2023-05-22 NOTE — Hospital Course (Signed)
Phillip Tate is a 67 y.o. male with medical history significant of hypertension, hyperlipidemia, neuropathy, diabetes mellitus, anemia, GERD, CKD 1, CAD, rectal cancer status post surgery and colostomy recent hospital with abdominal pain and decreased ostomy output.  Patient has history of small bowel obstructions in the past which has been recurrent since his surgery for rectal cancer in the stomach.  In the ED blood pressure was slightly elevated.  Labs showed hypokalemia with potassium of 5.2.  Lipase was negative.  CT abdomen pelvis showed small bowel obstruction with transition point.  Patient was then considered for admission to the hospital for further evaluation and treatment.  Assessment and plan.  SBO History of rectal cancer with ostomy Patient with history of recurrent small bowel obstruction in the past.  General surgery on board.  Currently n.p.o. IV fluids NG tube.  Imaging protocol for small bowel obstruction.  Follow surgical recommendations.   Hypertension Currently holding home amlodipine, carvedilol, lisinopril.  Add as needed hydralazine.   Hyperlipidemia Hold Crestor.   GERD On oral Protonix at home.  Changed to IV PPI.   Diabetes mellitis type II Patient is on active 60 units nightly, 20 to 30 units with meals at home. - 20 units nightly while NPO with sliding scale insulin for now.

## 2023-05-23 DIAGNOSIS — K56609 Unspecified intestinal obstruction, unspecified as to partial versus complete obstruction: Secondary | ICD-10-CM | POA: Diagnosis not present

## 2023-05-23 LAB — COMPREHENSIVE METABOLIC PANEL
ALT: 16 U/L (ref 0–44)
AST: 19 U/L (ref 15–41)
Albumin: 3.1 g/dL — ABNORMAL LOW (ref 3.5–5.0)
Alkaline Phosphatase: 56 U/L (ref 38–126)
Anion gap: 8 (ref 5–15)
BUN: 21 mg/dL (ref 8–23)
CO2: 22 mmol/L (ref 22–32)
Calcium: 8.2 mg/dL — ABNORMAL LOW (ref 8.9–10.3)
Chloride: 106 mmol/L (ref 98–111)
Creatinine, Ser: 0.95 mg/dL (ref 0.61–1.24)
GFR, Estimated: >60 mL/min (ref >60)
Glucose, Bld: 61 mg/dL — ABNORMAL LOW (ref 70–99)
Potassium: 3.6 mmol/L (ref 3.5–5.1)
Sodium: 136 mmol/L (ref 135–145)
Total Bilirubin: 0.9 mg/dL (ref 0.3–1.2)
Total Protein: 6.2 g/dL — ABNORMAL LOW (ref 6.5–8.1)

## 2023-05-23 LAB — GLUCOSE, CAPILLARY
Glucose-Capillary: 100 mg/dL — ABNORMAL HIGH (ref 70–99)
Glucose-Capillary: 113 mg/dL — ABNORMAL HIGH (ref 70–99)
Glucose-Capillary: 180 mg/dL — ABNORMAL HIGH (ref 70–99)
Glucose-Capillary: 88 mg/dL (ref 70–99)

## 2023-05-23 LAB — CBC
HCT: 43 % (ref 39.0–52.0)
Hemoglobin: 13.3 g/dL (ref 13.0–17.0)
MCH: 22.9 pg — ABNORMAL LOW (ref 26.0–34.0)
MCHC: 30.9 g/dL (ref 30.0–36.0)
MCV: 74 fL — ABNORMAL LOW (ref 80.0–100.0)
Platelets: 190 10*3/uL (ref 150–400)
RBC: 5.81 MIL/uL (ref 4.22–5.81)
RDW: 22.2 % — ABNORMAL HIGH (ref 11.5–15.5)
WBC: 5.5 10*3/uL (ref 4.0–10.5)
nRBC: 0 % (ref 0.0–0.2)

## 2023-05-23 LAB — MAGNESIUM: Magnesium: 2.1 mg/dL (ref 1.7–2.4)

## 2023-05-23 MED ORDER — INSULIN GLARGINE 100 UNIT/ML ~~LOC~~ SOLN: 20.0000 [IU] | Freq: Every day | SUBCUTANEOUS | 11 refills | Status: AC

## 2023-05-23 MED ORDER — PANTOPRAZOLE SODIUM 40 MG PO TBEC
40.0000 mg | DELAYED_RELEASE_TABLET | Freq: Two times a day (BID) | ORAL | 0 refills | Status: DC
Start: 2023-05-23 — End: 2024-01-12

## 2023-05-23 MED ORDER — CARVEDILOL 3.125 MG PO TABS: 25.0000 mg | ORAL_TABLET | Freq: Two times a day (BID) | ORAL | 0 refills | Status: DC

## 2023-05-23 NOTE — Discharge Summary (Signed)
Physician Discharge Summary  Patient ID: Phillip Tate MRN: [id number] DOB/AGE: Apr 16, 1956 67 y.o.  Admit date: 05/21/2023 Discharge date: 05/23/2023  Admission Diagnoses:  Discharge Diagnoses:  Principal Problem:   SBO (small bowel obstruction) (HCC) Active Problems:   Diabetes mellitus without complication (HCC)   Essential hypertension   Hyperlipidemia   Polyneuropathy due to type 2 diabetes mellitus (HCC)   Iron deficiency anemia   History of rectal cancer with ostomy    Gastroesophageal reflux disease   Discharged Condition: stable  Hospital Course: Patient is a 67 year old male with past medical history significant for hypertension, hyperlipidemia, neuropathy, diabetes mellitus, anemia, GERD, CKD 1, CAD, rectal cancer status post surgery and s/p colostomy.  Patient was admitted recently with abdominal pain and decreased ostomy output.  Patient has history of recurrent small bowel obstructions following surgery for rectal cancer.  In the ED, blood pressure was slightly elevated.  CT abdomen pelvis showed small bowel obstruction with transition point.  Patient was admitted and managed supportively.  Surgical team directed patient's care.  Patient has been able to tolerate orally.  Surgical team is cleared patient for discharge.    SBO, History of rectal cancer with ostomy -Patient with history of recurrent small bowel obstruction in the past.  History of multiple prior abdominal surgeries.  CT scan showing SBO with transition in the right lower quadrant.   -General surgery team was consulted to assist with patient's management.  Patient was managed supportively.  Patient was kept n.p.o. NG tube was placed and patient was adequately hydrated. -Symptoms have resolved with supportive care.  Patient is eager to be discharged back on.  General surgery team has cleared patient for discharge. -Patient will follow-up with primary care provider and general surgery team within 1 week  of discharge.   Hypertension Continue to monitor and optimize.     Hyperlipidemia Resume Crestor.   GERD Patient was initially on IV PPI. -Continue home oral PPI.     Diabetes mellitus type II, insulin-dependent Patient is on active 60 units nightly, 20 to 30 units with meals at home.  Continue 20 units nightly while NPO with sliding scale insulin every 4 hourly for now.  Will closely monitor for hypoglycemia.   Consults: general surgery  Significant Diagnostic Studies:  CT abdomen and pelvis with IV contrast revealed: 1. Small-bowel obstruction with transition point at the small bowel anastomosis in the right anterior lower abdomen, similar to prior study. 2.  Aortic Atherosclerosis (ICD10-I70.0).  Treatments: Patient was managed supportively.  Discharge Exam: Blood pressure 132/74, pulse (!) 58, temperature 98 F (36.7 C), temperature source Oral, resp. rate 17, height 5\' 10"  (1.778 m), weight 101 kg, SpO2 96 %.   Disposition: Discharge disposition: 01-Home or Self Care       Discharge Instructions     Diet - low sodium heart healthy   Complete by: As directed    Increase activity slowly   Complete by: As directed       Allergies as of 05/23/2023       Reactions   Cardizem Cd [diltiazem Hcl Er Beads] Swelling   Toprol Xl [metoprolol] Other (See Comments)   headache   Tricor [fenofibrate] Other (See Comments)   Constipation        Medication List     STOP taking these medications    ibuprofen 200 MG tablet Commonly known as: ADVIL   ibuprofen 600 MG tablet Commonly known as: ADVIL   methocarbamol 500 MG tablet Commonly known  as: ROBAXIN   zolpidem 10 MG tablet Commonly known as: AMBIEN       TAKE these medications    acetaminophen 500 MG tablet Commonly known as: TYLENOL Take 500 mg by mouth every 6 (six) hours as needed for mild pain.   amLODipine 5 MG tablet Commonly known as: NORVASC Take 5 mg by mouth daily.   ascorbic acid  500 MG tablet Commonly known as: VITAMIN C Take 500 mg by mouth 2 (two) times daily.   carvedilol 3.125 MG tablet Commonly known as: COREG Take 8 tablets (25 mg total) by mouth 2 (two) times daily with a meal. What changed: medication strength   cholecalciferol 25 MCG (1000 UNIT) tablet Commonly known as: VITAMIN D3 Take 1,000 Units by mouth daily.   empagliflozin 25 MG Tabs tablet Commonly known as: JARDIANCE Take 25 mg by mouth daily.   gabapentin 300 MG capsule Commonly known as: NEURONTIN Take 300 mg by mouth 2 (two) times daily.   insulin glargine 100 UNIT/ML injection Commonly known as: LANTUS Inject 0.2 mLs (20 Units total) into the skin at bedtime. What changed: how much to take   IRON PO Take 1 tablet by mouth daily.   lisinopril 40 MG tablet Commonly known as: ZESTRIL Take 1 tablet (40 mg total) by mouth daily.   metFORMIN 850 MG tablet Commonly known as: GLUCOPHAGE Take 850 mg by mouth 2 (two) times daily with a meal.   multivitamin with minerals Tabs tablet Take 1 tablet by mouth daily.   NovoLOG FlexPen 100 UNIT/ML FlexPen Generic drug: insulin aspart Inject 20-40 Units into the skin 3 (three) times daily with meals. Per sliding scale   pantoprazole 40 MG tablet Commonly known as: PROTONIX Take 1 tablet (40 mg total) by mouth 2 (two) times daily.   polyethylene glycol 17 g packet Commonly known as: MIRALAX / GLYCOLAX Take 17 g by mouth daily as needed.   rosuvastatin 10 MG tablet Commonly known as: CRESTOR Take 1 tablet (10 mg total) by mouth daily. What changed: when to take this   SYSTANE FREE OP Place 1 drop into both eyes 3 (three) times daily.        Time spent: 36 minutes.  SignedBarnetta Chapel 05/23/2023, 3:43 PM

## 2023-05-23 NOTE — Progress Notes (Signed)
Patient tolerated soft diet and denies any nausea or vomiting. Patient denies any pain  Patient requesting to go home.

## 2023-05-23 NOTE — Progress Notes (Signed)
Progress Note     Subjective: Requesting a soft diet so he can go home today.  Tolerated CLD.  Colostomy still working  Objective: Vital signs in last 24 hours: Temp:  [97.9 F (36.6 C)-98.7 F (37.1 C)] 97.9 F (36.6 C) (05/25 0810) Pulse Rate:  [55-75] 55 (05/25 0810) Resp:  [16-17] 16 (05/25 0810) BP: (111-137)/(65-75) 130/65 (05/25 0810) SpO2:  [93 %-100 %] 97 % (05/25 0810) Last BM Date : 05/22/23  Intake/Output from previous day: 05/24 0701 - 05/25 0700 In: 1729.9 [I.V.:1629.9; IV Piggyback:100] Out: 1000 [Stool:1000] Intake/Output this shift: No intake/output data recorded.  PE: Abd: soft, NT, ND, colostomy with feculent output   Lab Results:  Recent Labs    05/22/23 0145 05/23/23 0110  WBC 7.7 5.5  HGB 14.7 13.3  HCT 48.1 43.0  PLT 244 190   BMET Recent Labs    05/22/23 0145 05/23/23 0110  NA 137 136  K 4.2 3.6  CL 102 106  CO2 23 22  GLUCOSE 179* 61*  BUN 15 21  CREATININE 0.81 0.95  CALCIUM 8.9 8.2*   PT/INR No results for input(s): "LABPROT", "INR" in the last 72 hours. CMP     Component Value Date/Time   NA 136 05/23/2023 0110   K 3.6 05/23/2023 0110   CL 106 05/23/2023 0110   CO2 22 05/23/2023 0110   GLUCOSE 61 (L) 05/23/2023 0110   BUN 21 05/23/2023 0110   CREATININE 0.95 05/23/2023 0110   CALCIUM 8.2 (L) 05/23/2023 0110   PROT 6.2 (L) 05/23/2023 0110   ALBUMIN 3.1 (L) 05/23/2023 0110   AST 19 05/23/2023 0110   ALT 16 05/23/2023 0110   ALKPHOS 56 05/23/2023 0110   BILITOT 0.9 05/23/2023 0110   GFRNONAA >60 05/23/2023 0110   GFRAA  03/03/2010 1455    >60        The eGFR has been calculated using the MDRD equation. This calculation has not been validated in all clinical situations. eGFR's persistently <60 mL/min signify possible Chronic Kidney Disease.   Lipase     Component Value Date/Time   LIPASE 28 05/21/2023 1019       Studies/Results: DG Abd Portable 1V-Small Bowel Obstruction Protocol-24 hr  delay  Result Date: 05/22/2023 CLINICAL DATA:  NG tube placement, 24 hour small-bowel protocol EXAM: PORTABLE ABDOMEN - 1 VIEW COMPARISON:  CT 05/21/2023, radiographs 05/21/2023, 05/22/2023 FINDINGS: No esophageal tube identified on the three views submitted. Left lower quadrant ostomy. Moderate air distension of multiple small bowel loops measuring up to 4.4 cm. Possible dilute contrast within the right colon. IMPRESSION: 1. No esophageal tube is identified on the three views submitted. 2. Moderate air distension of multiple small bowel loops, appears slightly increased compared with scout image from 05/21/2023 CT. Question small amount of dilute contrast in right upper quadrant colon. Electronically Signed   By: Jasmine Pang M.D.   On: 05/22/2023 21:54   DG Abd Portable 1V-Small Bowel Obstruction Protocol-initial, 8 hr delay  Result Date: 05/22/2023 CLINICAL DATA:  Small bowel obstruction. EXAM: PORTABLE ABDOMEN - 1 VIEW COMPARISON:  05/21/2023 FINDINGS: Fluid-filled/opacified small bowel loops are seen in the upper abdomen. Lack of gas filled loops limits assessment for degree of distension. Prominent stool volume evident right colon. Evidence of prior left ventral mesh placement. Bones are diffusely demineralized. IMPRESSION: Fluid-filled/opacified small bowel loops in the upper abdomen. Lack of gas filled loops limits assessment for degree of distension. Electronically Signed   By: Jamison Oka.D.  On: 05/22/2023 06:23   DG Abd Portable 1V-Small Bowel Protocol-Position Verification  Result Date: 05/21/2023 CLINICAL DATA:  NG tube placement. EXAM: PORTABLE ABDOMEN - 1 VIEW COMPARISON:  04/02/2023 FINDINGS: NG tube tip is positioned in the mid stomach. Side port of the NG tube is below the GE junction. Visualized upper abdomen demonstrates nonspecific bowel gas pattern. IMPRESSION: NG tube tip is in the mid stomach. Electronically Signed   By: Kennith Center M.D.   On: 05/21/2023 13:15   CT  ABDOMEN PELVIS W CONTRAST  Result Date: 05/21/2023 CLINICAL DATA:  Severe abdominal pain with decreased ostomy output. History of multiple prior bowel obstructions. EXAM: CT ABDOMEN AND PELVIS WITH CONTRAST TECHNIQUE: Multidetector CT imaging of the abdomen and pelvis was performed using the standard protocol following bolus administration of intravenous contrast. RADIATION DOSE REDUCTION: This exam was performed according to the departmental dose-optimization program which includes automated exposure control, adjustment of the mA and/or kV according to patient size and/or use of iterative reconstruction technique. CONTRAST:  75mL OMNIPAQUE IOHEXOL 350 MG/ML SOLN COMPARISON:  CT abdomen pelvis dated April 01, 2023. FINDINGS: Lower chest: No acute abnormality. Hepatobiliary: No focal liver abnormality is seen. No gallstones, gallbladder wall thickening, or biliary dilatation. Pancreas: Unremarkable. No pancreatic ductal dilatation or surrounding inflammatory changes. Spleen: Normal in size without focal abnormality. Adrenals/Urinary Tract: Adrenal glands are unremarkable. Kidneys are normal, without renal calculi, focal lesion, or hydronephrosis. Bladder is unremarkable. Stomach/Bowel: The stomach is within normal limits. Multiple dilated loops of small bowel with fecalization are again noted with transition point at the small bowel anastomosis in the right anterior lower abdomen (series 3, image 61). Loops of ileum distal to the anastomosis are decompressed. Postsurgical changes from prior left hemicolectomy again noted with left-sided colostomy. Remaining colon is unremarkable. Normal appendix. Vascular/Lymphatic: Aortic atherosclerosis. No enlarged abdominal or pelvic lymph nodes. Reproductive: Prostate is unremarkable. Penile implant reservoir in the right pelvis again noted. Other: Trace interloop ascites in the right abdomen amongst the dilated small bowel loops, similar to prior study. No pneumoperitoneum.  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 Musculoskeletal: No acute or significant osseous findings. IMPRESSION: 1. Small-bowel obstruction with transition point at the small bowel anastomosis in the right anterior lower abdomen, similar to prior study. 2.  Aortic Atherosclerosis (ICD10-I70.0). Electronically Signed   By: Obie Dredge M.D.   On: 05/21/2023 12:13    Anti-infectives: Anti-infectives (From admission, onward)    None        Assessment/Plan  SBO - tolerating CLD -adv to soft diet and can DC home later today if he tolerates this with no issues.  Patient has requested this line of care, but given his benign exam and tolerance of liquids, etc, I do not think this is unreasonable unless he fails to tolerate his soft diet, at which point he would need to stay, obviously. -d/w primary service   FEN: soft VTE: ok to have LMWH or SQH from surgery standpoint ID: none indicated    LOS: 1 day   I reviewed nursing notes, hospitalist notes, last 24 h vitals and pain scores, last 48 h intake and output, last 24 h labs and trends, and last 24 h imaging results.    Letha Cape, [facility] 05/23/2023, 8:51 AM Please see Amion for pager number during day hours 7:00am-4:30pm

## 2023-05-26 ENCOUNTER — Ambulatory Visit: Payer: Medicare HMO

## 2023-05-28 DIAGNOSIS — Z933 Colostomy status: Secondary | ICD-10-CM | POA: Diagnosis not present

## 2023-05-28 DIAGNOSIS — K56600 Partial intestinal obstruction, unspecified as to cause: Secondary | ICD-10-CM | POA: Diagnosis not present

## 2023-05-29 ENCOUNTER — Ambulatory Visit (HOSPITAL_COMMUNITY)
Admission: RE | Admit: 2023-05-29 | Discharge: 2023-05-29 | Disposition: A | Discharge status: Home / Self Care | Payer: Medicare HMO | Source: Ambulatory Visit | Attending: Internal Medicine | Admitting: Internal Medicine

## 2023-05-29 DIAGNOSIS — K4021 Bilateral inguinal hernia, without obstruction or gangrene, recurrent: Secondary | ICD-10-CM

## 2023-05-29 DIAGNOSIS — K5651 Intestinal adhesions [bands], with partial obstruction: Secondary | ICD-10-CM | POA: Diagnosis not present

## 2023-05-29 DIAGNOSIS — K9400 Colostomy complication, unspecified: Secondary | ICD-10-CM

## 2023-05-29 DIAGNOSIS — K56609 Unspecified intestinal obstruction, unspecified as to partial versus complete obstruction: Secondary | ICD-10-CM | POA: Insufficient documentation

## 2023-06-01 DIAGNOSIS — E1165 Type 2 diabetes mellitus with hyperglycemia: Secondary | ICD-10-CM | POA: Diagnosis not present

## 2023-06-01 NOTE — Progress Notes (Signed)
Ostomy Clinic   Reason for visit:  LLQ colostomy, recent small bowel obstruction, recurring.  At the small bowel transition, site of previous bowel anastomosis Good response to SMOG enema via stoma and supportive care while in hospital.  HPI:  Rectal cancer with colostomy Past Medical History:  Diagnosis Date   Coronary artery disease    DDD (degenerative disc disease), lumbar    Diabetes mellitus without complication (HCC)    Dyslipidemia    History of rectal cancer    dx 2006 --  Stage III, T3  N1  M0,  s/p  chemoradiation (07-10-2005 to 09-05-2005) and anterior colon resection 11-04-2006   History of shingles    Hyperlipidemia    Hypertension    Iron deficiency anemia    intolerent PO iron--  gets IV iron,  last one 03-12-2015 ([facility])   Organic impotence    Rectal cancer (HCC)    adenocarcinoma stage lll   Family History  Problem Relation Age of Onset   Hypertension Father    Diabetes Mother    Heart attack Neg Hx    Colon cancer Neg Hx    Esophageal cancer Neg Hx    Rectal cancer Neg Hx    Stomach cancer Neg Hx    Allergies  Allergen Reactions   Cardizem Cd [Diltiazem Hcl Er Beads] Swelling   Toprol Xl [Metoprolol] Other (See Comments)    headache   Tricor [Fenofibrate] Other (See Comments)    Constipation    Current Outpatient Medications  Medication Sig Dispense Refill Last Dose   acetaminophen (TYLENOL) 500 MG tablet Take 500 mg by mouth every 6 (six) hours as needed for mild pain.      amLODipine (NORVASC) 5 MG tablet Take 5 mg by mouth daily.      ascorbic acid (VITAMIN C) 500 MG tablet Take 500 mg by mouth 2 (two) times daily.      carvedilol (COREG) 3.125 MG tablet Take 8 tablets (25 mg total) by mouth 2 (two) times daily with a meal. 60 tablet 0    cholecalciferol (VITAMIN D3) 25 MCG (1000 UNIT) tablet Take 1,000 Units by mouth daily.      empagliflozin (JARDIANCE) 25 MG TABS tablet Take 25 mg by mouth daily.      Ferrous Sulfate (IRON PO)  Take 1 tablet by mouth daily.      gabapentin (NEURONTIN) 300 MG capsule Take 300 mg by mouth 2 (two) times daily.  0    insulin glargine (LANTUS) 100 UNIT/ML injection Inject 0.2 mLs (20 Units total) into the skin at bedtime. 10 mL 11    lisinopril (ZESTRIL) 40 MG tablet Take 1 tablet (40 mg total) by mouth daily.      metFORMIN (GLUCOPHAGE) 850 MG tablet Take 850 mg by mouth 2 (two) times daily with a meal.      Multiple Vitamin (MULTIVITAMIN WITH MINERALS) TABS tablet Take 1 tablet by mouth daily.      NOVOLOG FLEXPEN 100 UNIT/ML FlexPen Inject 20-40 Units into the skin 3 (three) times daily with meals. Per sliding scale  2    pantoprazole (PROTONIX) 40 MG tablet Take 1 tablet (40 mg total) by mouth 2 (two) times daily. 60 tablet 0    Polyethyl Glycol-Propyl Glycol (SYSTANE FREE OP) Place 1 drop into both eyes 3 (three) times daily.      polyethylene glycol (MIRALAX / GLYCOLAX) 17 g packet Take 17 g by mouth daily as needed.  rosuvastatin (CRESTOR) 10 MG tablet Take 1 tablet (10 mg total) by mouth daily. (Patient taking differently: Take 10 mg by mouth every evening.) 90 tablet 3    No current facility-administered medications for this encounter.   ROS  Review of Systems  Gastrointestinal:        LLQ colostomy Constipation, slow transit Recent bowel obstruction, resolved  Skin: Negative.   All other systems reviewed and are negative.  Vital signs:  BP 110/69   Pulse 74   Temp 98.7 F (37.1 C) (Oral)   Resp 18   SpO2 95%  Exam:  Physical Exam Vitals reviewed.  Constitutional:      Appearance: Normal appearance.  Abdominal:     Palpations: Abdomen is soft.     Hernia: A hernia is present.  Skin:    General: Skin is warm and dry.  Neurological:     Mental Status: He is alert and oriented to person, place, and time.  Psychiatric:        Mood and Affect: Mood normal.        Behavior: Behavior normal.     Stoma type/location:  LLQ colostomy Stomal assessment/size:  1  1/2" pink and moist Peristomal assessment:  Parastomal hernia Treatment options for stomal/peristomal skin: barrier ring and 2 piece pouch Output: soft brown stool Ostomy pouching: 2pc. Pouch with barrier ring  Education provided:  patient is feeling much better.  We discuss exercise, diet, hydration and continuing bowel regimen to keep stool moving.  He is feeling stronger and increasing activity daily. No new issues with pouching in regards to hernia and change in abdominal topography.  No issues with supply acquisition.     Impression/dx  Colostomy SBO, recurring Discussion  See above Plan  See back as needed.     Visit time: 45 minutes.   Phillip Hudson FNP-BC

## 2023-06-03 DIAGNOSIS — H0288B Meibomian gland dysfunction left eye, upper and lower eyelids: Secondary | ICD-10-CM | POA: Diagnosis not present

## 2023-06-03 DIAGNOSIS — H04213 Epiphora due to excess lacrimation, bilateral lacrimal glands: Secondary | ICD-10-CM | POA: Diagnosis not present

## 2023-06-03 DIAGNOSIS — H0288A Meibomian gland dysfunction right eye, upper and lower eyelids: Secondary | ICD-10-CM | POA: Diagnosis not present

## 2023-06-03 DIAGNOSIS — H04123 Dry eye syndrome of bilateral lacrimal glands: Secondary | ICD-10-CM | POA: Diagnosis not present

## 2023-06-03 DIAGNOSIS — H0279 Other degenerative disorders of eyelid and periocular area: Secondary | ICD-10-CM | POA: Diagnosis not present

## 2023-06-03 DIAGNOSIS — H02224 Mechanical lagophthalmos left upper eyelid: Secondary | ICD-10-CM | POA: Diagnosis not present

## 2023-06-03 DIAGNOSIS — H02535 Eyelid retraction left lower eyelid: Secondary | ICD-10-CM | POA: Diagnosis not present

## 2023-06-03 DIAGNOSIS — H02532 Eyelid retraction right lower eyelid: Secondary | ICD-10-CM | POA: Diagnosis not present

## 2023-06-03 DIAGNOSIS — H02221 Mechanical lagophthalmos right upper eyelid: Secondary | ICD-10-CM | POA: Diagnosis not present

## 2023-06-04 DIAGNOSIS — L810 Postinflammatory hyperpigmentation: Secondary | ICD-10-CM | POA: Diagnosis not present

## 2023-06-05 DIAGNOSIS — K4021 Bilateral inguinal hernia, without obstruction or gangrene, recurrent: Secondary | ICD-10-CM | POA: Insufficient documentation

## 2023-06-05 NOTE — Discharge Instructions (Signed)
See back as needed Call clinic for concerns with stoma, supplies or general questions

## 2023-06-09 DIAGNOSIS — J310 Chronic rhinitis: Secondary | ICD-10-CM | POA: Diagnosis not present

## 2023-06-09 DIAGNOSIS — H9042 Sensorineural hearing loss, unilateral, left ear, with unrestricted hearing on the contralateral side: Secondary | ICD-10-CM | POA: Diagnosis not present

## 2023-06-09 DIAGNOSIS — H9312 Tinnitus, left ear: Secondary | ICD-10-CM | POA: Diagnosis not present

## 2023-06-09 DIAGNOSIS — J342 Deviated nasal septum: Secondary | ICD-10-CM | POA: Diagnosis not present

## 2023-06-09 DIAGNOSIS — J343 Hypertrophy of nasal turbinates: Secondary | ICD-10-CM | POA: Diagnosis not present

## 2023-06-15 DIAGNOSIS — Z933 Colostomy status: Secondary | ICD-10-CM | POA: Diagnosis not present

## 2023-06-15 DIAGNOSIS — Z961 Presence of intraocular lens: Secondary | ICD-10-CM | POA: Diagnosis not present

## 2023-06-15 DIAGNOSIS — H04213 Epiphora due to excess lacrimation, bilateral lacrimal glands: Secondary | ICD-10-CM | POA: Diagnosis not present

## 2023-06-18 DIAGNOSIS — M5416 Radiculopathy, lumbar region: Secondary | ICD-10-CM | POA: Diagnosis not present

## 2023-06-22 ENCOUNTER — Ambulatory Visit (HOSPITAL_COMMUNITY)
Admission: RE | Admit: 2023-06-22 | Discharge: 2023-06-22 | Disposition: A | Discharge status: Home / Self Care | Payer: Medicare HMO | Source: Ambulatory Visit | Attending: Nurse Practitioner | Admitting: Nurse Practitioner

## 2023-06-22 DIAGNOSIS — K9400 Colostomy complication, unspecified: Secondary | ICD-10-CM | POA: Diagnosis not present

## 2023-06-22 DIAGNOSIS — Z8719 Personal history of other diseases of the digestive system: Secondary | ICD-10-CM | POA: Diagnosis not present

## 2023-06-22 DIAGNOSIS — Z933 Colostomy status: Secondary | ICD-10-CM | POA: Insufficient documentation

## 2023-06-22 DIAGNOSIS — K435 Parastomal hernia without obstruction or  gangrene: Secondary | ICD-10-CM | POA: Diagnosis not present

## 2023-06-22 NOTE — Progress Notes (Signed)
[facility]   Reason for visit:  LLQ colostomy, would like to inquire about colostomy irrigation.   HPI:  Recent small bowel obstruction Past Medical History:  Diagnosis Date  . Coronary artery disease   . DDD (degenerative disc disease), lumbar   . Diabetes mellitus without complication (HCC)   . Dyslipidemia   . History of rectal cancer    dx 2006 --  Stage III, T3  N1  M0,  s/p  chemoradiation (07-10-2005 to 09-05-2005) and anterior colon resection 11-04-2006  . History of shingles   . Hyperlipidemia   . Hypertension   . Iron deficiency anemia    intolerent PO iron--  gets IV iron,  last one 03-12-2015 ([facility])  . Organic impotence   . Rectal cancer (HCC)    adenocarcinoma stage lll   Family History  Problem Relation Age of Onset  . Hypertension Father   . Diabetes Mother   . Heart attack Neg Hx   . Colon cancer Neg Hx   . Esophageal cancer Neg Hx   . Rectal cancer Neg Hx   . Stomach cancer Neg Hx    Allergies  Allergen Reactions  . Cardizem Cd [Diltiazem Hcl Er Beads] Swelling  . Toprol Xl [Metoprolol] Other (See Comments)    headache  . Tricor [Fenofibrate] Other (See Comments)    Constipation    Current Outpatient Medications  Medication Sig Dispense Refill Last Dose  . acetaminophen (TYLENOL) 500 MG tablet Take 500 mg by mouth every 6 (six) hours as needed for mild pain.     Marland Kitchen amLODipine (NORVASC) 5 MG tablet Take 5 mg by mouth daily.     Marland Kitchen ascorbic acid (VITAMIN C) 500 MG tablet Take 500 mg by mouth 2 (two) times daily.     . carvedilol (COREG) 3.125 MG tablet Take 8 tablets (25 mg total) by mouth 2 (two) times daily with a meal. 60 tablet 0   . cholecalciferol (VITAMIN D3) 25 MCG (1000 UNIT) tablet Take 1,000 Units by mouth daily.     . empagliflozin (JARDIANCE) 25 MG TABS tablet Take 25 mg by mouth daily.     . Ferrous Sulfate (IRON PO) Take 1 tablet by mouth daily.     Marland Kitchen gabapentin (NEURONTIN) 300 MG capsule Take 300 mg by mouth 2 (two) times  daily.  0   . insulin glargine (LANTUS) 100 UNIT/ML injection Inject 0.2 mLs (20 Units total) into the skin at bedtime. 10 mL 11   . lisinopril (ZESTRIL) 40 MG tablet Take 1 tablet (40 mg total) by mouth daily.     . metFORMIN (GLUCOPHAGE) 850 MG tablet Take 850 mg by mouth 2 (two) times daily with a meal.     . Multiple Vitamin (MULTIVITAMIN WITH MINERALS) TABS tablet Take 1 tablet by mouth daily.     Marland Kitchen NOVOLOG FLEXPEN 100 UNIT/ML FlexPen Inject 20-40 Units into the skin 3 (three) times daily with meals. Per sliding scale  2   . pantoprazole (PROTONIX) 40 MG tablet Take 1 tablet (40 mg total) by mouth 2 (two) times daily. 60 tablet 0   . Polyethyl Glycol-Propyl Glycol (SYSTANE FREE OP) Place 1 drop into both eyes 3 (three) times daily.     . polyethylene glycol (MIRALAX / GLYCOLAX) 17 g packet Take 17 g by mouth daily as needed.     . rosuvastatin (CRESTOR) 10 MG tablet Take 1 tablet (10 mg total) by mouth daily. (Patient taking differently: Take 10 mg by  mouth every evening.) 90 tablet 3    No current facility-administered medications for this encounter.   ROS  Review of Systems  Gastrointestinal:        LLQ colostomy  Skin: Negative.   All other systems reviewed and are negative. Vital signs:  BP 114/63 (BP Location: Right Arm)   Pulse 73   Temp 98.1 F (36.7 C) (Oral)   Resp 20   SpO2 95%  Exam:  Physical Exam Vitals reviewed.  Constitutional:      Appearance: Normal appearance.  Abdominal:     Palpations: Abdomen is soft.     Hernia: A hernia is present.  Skin:    General: Skin is warm and dry.  Neurological:     Mental Status: He is alert and oriented to person, place, and time.  Psychiatric:        Mood and Affect: Mood normal.        Behavior: Behavior normal.    Stoma type/location:  LLQ colostomy, budded Stomal assessment/size:  1 1/2" pink and moist Peristomal assessment:  intact, parastomal hernia present Treatment options for stomal/peristomal skin: barrier  ring and 2 piece pouch Output: soft brown stool Ostomy pouching: 2pc. With barrier ring Education provided:  Patient has upcoming surgery to resect bowel and cut away the scarred area to reduce risk of obstruction    Impression/dx  Colostomy  Parastomal hernia Discussion  Upcoming surgery Colostomy irrigation Plan  He does not feel comfortable performing SMOG enema via cone irrigation kit.  He will call clinic if needed or report to ED if after hours.     Visit time: 45 minutes.   Maple Hudson FNP-BC

## 2023-06-24 DIAGNOSIS — Z8719 Personal history of other diseases of the digestive system: Secondary | ICD-10-CM | POA: Insufficient documentation

## 2023-06-24 NOTE — Discharge Instructions (Signed)
Call clinic as needed Good luck with upcoming surgery!

## 2023-07-07 DIAGNOSIS — E1136 Type 2 diabetes mellitus with diabetic cataract: Secondary | ICD-10-CM | POA: Diagnosis not present

## 2023-07-07 DIAGNOSIS — E782 Mixed hyperlipidemia: Secondary | ICD-10-CM | POA: Diagnosis not present

## 2023-07-07 DIAGNOSIS — Z1331 Encounter for screening for depression: Secondary | ICD-10-CM | POA: Diagnosis not present

## 2023-07-07 DIAGNOSIS — F411 Generalized anxiety disorder: Secondary | ICD-10-CM | POA: Diagnosis not present

## 2023-07-07 DIAGNOSIS — N400 Enlarged prostate without lower urinary tract symptoms: Secondary | ICD-10-CM | POA: Diagnosis not present

## 2023-07-07 DIAGNOSIS — E1122 Type 2 diabetes mellitus with diabetic chronic kidney disease: Secondary | ICD-10-CM | POA: Diagnosis not present

## 2023-07-07 DIAGNOSIS — Z9181 History of falling: Secondary | ICD-10-CM | POA: Diagnosis not present

## 2023-07-07 DIAGNOSIS — E113291 Type 2 diabetes mellitus with mild nonproliferative diabetic retinopathy without macular edema, right eye: Secondary | ICD-10-CM | POA: Diagnosis not present

## 2023-07-07 DIAGNOSIS — Z0000 Encounter for general adult medical examination without abnormal findings: Secondary | ICD-10-CM | POA: Diagnosis not present

## 2023-07-07 DIAGNOSIS — Z23 Encounter for immunization: Secondary | ICD-10-CM | POA: Diagnosis not present

## 2023-07-07 DIAGNOSIS — I10 Essential (primary) hypertension: Secondary | ICD-10-CM | POA: Diagnosis not present

## 2023-07-07 DIAGNOSIS — I700 Atherosclerosis of aorta: Secondary | ICD-10-CM | POA: Diagnosis not present

## 2023-07-07 DIAGNOSIS — I2510 Atherosclerotic heart disease of native coronary artery without angina pectoris: Secondary | ICD-10-CM | POA: Diagnosis not present

## 2023-07-08 DIAGNOSIS — H903 Sensorineural hearing loss, bilateral: Secondary | ICD-10-CM | POA: Diagnosis not present

## 2023-07-14 ENCOUNTER — Inpatient Hospital Stay: Admit: 2023-07-14 | Payer: MEDICARE

## 2023-07-14 DIAGNOSIS — Z01818 Encounter for other preprocedural examination: Secondary | ICD-10-CM

## 2023-07-14 DIAGNOSIS — R9431 Abnormal electrocardiogram [ECG] [EKG]: Secondary | ICD-10-CM | POA: Diagnosis not present

## 2023-07-14 DIAGNOSIS — K56600 Partial intestinal obstruction, unspecified as to cause: Secondary | ICD-10-CM | POA: Diagnosis not present

## 2023-07-14 DIAGNOSIS — K435 Parastomal hernia without obstruction or  gangrene: Secondary | ICD-10-CM | POA: Diagnosis not present

## 2023-07-14 DIAGNOSIS — Z8719 Personal history of other diseases of the digestive system: Secondary | ICD-10-CM | POA: Diagnosis not present

## 2023-07-14 LAB — EKG 12-LEAD
Atrial Rate: 69 {beats}/min
Diagnosis: NORMAL
P Axis: 38 degrees
P-R Interval: 156 ms
Q-T Interval: 418 ms
QRS Duration: 104 ms
QTc Calculation (Bazett): 447 ms
R Axis: 62 degrees
T Axis: 110 degrees
Ventricular Rate: 69 {beats}/min

## 2023-07-14 LAB — CBC WITH AUTO DIFFERENTIAL
Basophils %: 1 % (ref 0–1)
Basophils Absolute: 0.1 10*3/uL (ref 0.0–0.1)
Eosinophils %: 3 % (ref 0–7)
Eosinophils Absolute: 0.3 10*3/uL (ref 0.0–0.4)
Hematocrit: 43.8 % (ref 36.6–50.3)
Hemoglobin: 14 g/dL (ref 12.1–17.0)
Immature Granulocytes %: 0 % (ref 0.0–0.5)
Immature Granulocytes Absolute: 0 10*3/uL (ref 0.00–0.04)
Lymphocytes %: 29 % (ref 12–49)
Lymphocytes Absolute: 2.1 10*3/uL (ref 0.8–3.5)
MCH: 24.6 PG — ABNORMAL LOW (ref 26.0–34.0)
MCHC: 32 g/dL (ref 30.0–36.5)
MCV: 77.1 FL — ABNORMAL LOW (ref 80.0–99.0)
MPV: 10 FL (ref 8.9–12.9)
Monocytes %: 8 % (ref 5–13)
Monocytes Absolute: 0.6 10*3/uL (ref 0.0–1.0)
Neutrophils %: 59 % (ref 32–75)
Neutrophils Absolute: 4.3 10*3/uL (ref 1.8–8.0)
Nucleated RBCs: 0 PER 100 WBC
Platelets: 285 10*3/uL (ref 150–400)
RBC: 5.68 M/uL (ref 4.10–5.70)
RDW: 19.5 % — ABNORMAL HIGH (ref 11.5–14.5)
WBC: 7.4 10*3/uL (ref 4.1–11.1)
nRBC: 0 10*3/uL (ref 0.00–0.01)

## 2023-07-14 LAB — TYPE AND SCREEN
ABO/Rh: B POS
Antibody Screen: NEGATIVE

## 2023-07-14 NOTE — Other (Addendum)
CARDIOLOGY NOTES/[facility]. NOTED IN CARE EVERYWHERE FROM APPT 01/22/23.    ERAS NUTRITIONAL PLAN REVIEWED W/ PT AND WIFE.  PT GIVEN BAG OF DRINKS FOR DIABETICS AND BOTH PT AND WIFE VERBALIZED UNDERSTANDING OF THESE INSTRUCTIONS.  PT STATES HE WILL BE DOING CLEAR LIQUIDS THE DAY BEFORE SURGERY AS WELL AS A BOWEL PREP.  PT VERBALIZED UNDERSTANDING OF THESE INSTRUCTIONS FROM DR. GHAEMMAGHAMI.  PT TO BEGIN HIS ERAS DRINKS 6 DAYS PRIOR TO SURGERY ON 07/22/23.    PT INSTRUCTED ON INCENTIVE SPIROMETER.  PT VERBALIZED UNDERSTANDING OF ITS USE.  STATES HE HAS USED THIS MULTIPLE TIMES IN THE PAST AND DID NOT FEEL THE NEED TO DEMONSTRATE USE IN PAT.    PT HAS A LT LOWER QUADRANT COLOSTOMY THAT HE HAS HAD FOR 67YRS NOW.  HE STATES  THAT HIS SURGEON DID NOT MENTION DOING ANYTHING W/ THIS DURING HIS UPCOMING SURGERY.    PT AND WIFE LIVE IN [place], [place].  THEY STATE THEY WILL BE DRIVING UP DAY OF SURGERY.  REFUSED [facility] REFERRAL AND STATE THEY'D PREFER TO BE IN THEIR OWN HOME PRIOR TO COMING TO HOSPITAL.        PAT Nutrition Screen    1. Has the patient had an unplanned weight loss of 10% of body weight or more in the last 6 months? No = 0   2. Has the patient been eating less than 50% of their normal diet in the preceding week? No = 0       Patient instructed on Diabetic pre-operative nutrition plan to start 6 days prior to surgery. Patient given 10 shakes and 1 pre-surgery drinks. Opportunity given for questions and all questions answered.

## 2023-07-14 NOTE — Other (Signed)
[facility]                  [address], [place], [place] [address]   MAIN OR                                  [phone]    MAIN PRE OP             [phone]                                                                                AMBULATORY PRE OP          [phone]  PRE-ADMISSION TESTING    [phone]     Surgery Date:  07/28/23       Is surgery arrival time given by surgeon?  YES     If "NO", [facility] staff will call you between 4 and 7pm the day before your surgery with your arrival time. (If your surgery is on a Monday, we will call you the Friday before.)    Call [phone] after 7pm Monday-Friday if you did not receive this call.    INSTRUCTIONS BEFORE YOUR SURGERY   When You  Arrive Arrive at [facility] Patient Access on 1st floor the day of your surgery.  Have your insurance card, photo ID,living will/advanced directive/POA (if applicable),  and any copayment (if needed)   Food   and   Drink NO solid food after midnight the night before surgery. You can drink clear liquids from midnight until ONE hour prior to your arrival at the hospital on the day of your surgery. Clear liquids include:  Water  Fruit juices without pulp  Carbonated beverages  Black coffee(no cream/milk)  Tea(no cream/milk)  Gatorade    No alcohol (beer, wine, liquor) or marijuana (smoking) 24 hours, edibles (3 days). Stop smoking cigarettes 14 days before surgery (helps w/healing and breathing).   Medications to   TAKE   Morning of Surgery MEDICATIONS TO TAKE THE MORNING OF SURGERY WITH A SIP OF WATER:     GABAPENTIN, CARVEDILOL, ATROVENT NASAL SPRAY, PANTOPRAZOLE    Ask your surgeon/prescribing doctor for instructions on taking or stopping these medications prior to surgery: JARDIANCE SHOULD BE HELD FOR 3 DAYS PRIOR TO SURGERY, HOLD METFORMIN FOR 24HRS PRIOR TO SURGERY     Medications to STOP  before surgery Non-Steroidal anti-inflammatory Drugs (NSAID's): for example, Ibuprofen (Advil,  Motrin), Naproxen (Aleve) 7 days    STOP all herbal supplements and vitamins(unless prescribed by your doctor), and fish oil for 7 days    Other: ASK YOUR PRESCRIBING DOCTOR ABOUT HOW MUCH OF YOUR LANTUS INSULIN YOU SHOULD TAKE THE NIGHT PRIOR TO SURGERY.    (Pain medications not listed above, including Tylenol may be taken up until 4 hours prior to arrival time)   Blood  Thinners If you take Aspirin, Plavix, Coumadin, or any blood-thinning or anti-blood clot medicine, talk to the doctor who prescribed the medications for pre-operative instructions.  If you take aspirin or aspirin containing products for pain, stop 7 days prior to  surgery   Bathing Clothing  Jewelry  Valuables     When you shower the morning of surgery, please do not apply anything to your skin (lotions, powders, deodorant (if having breast surgery), or makeup, especially mascara). Remove fingernail polish except for clear.  Follow Chlorhexidine body wash instructions provided to you during PAT appointment. The night before and morning of surgery.  Do not shave or trim anywhere 24 hours before surgery  Wear your hair loose or down; no pony-tails, buns, or metal hair clips  Wear loose, comfortable, clean clothes  Wear glasses instead of contacts. Bring a case to keep your glasses safe.  Leave money, valuables, and jewelry, including body piercings, at home  If you were given an [employer], bring it on day of surgery.  If you use inhalers or CPAP machine, bring it with you the day of surgery.     Going Home - or Spending the Night OUTPATIENT SURGERY: You must have a responsible adult drive you home and stay with you 24 hours after surgery. You may not drive for 24 hours after surgery.  ADMITS: If your doctor is keeping you in the hospital after surgery, leave personal belongings/luggage in your car until you have a hospital room number.    Hospital discharge time is 12 noon  Drivers must be here before 12 noon unless you are told differently    Special Instructions Free valet parking available from 6 AM until 4:30 PM.         Preventing Infections Before and After - Your Surgery    IMPORTANT INSTRUCTIONS      You play an important role in your health and preparation for surgery. To reduce the germs on your skin you will need to shower with CHG soap (Chorhexidine gluconate 4%) two times before surgery.    CHG soap (Hibiclens, Hex-A-Clens or store brand)  CHG soap will be provided at your Preadmission Testing (PAT) appointment.  If you do not have a PAT appointment before surgery, you may arrange to pick up CHG soap from our office or purchase CHG soap at a pharmacy, grocery or department store.  You need to purchase TWO 4 ounce bottles to use for your 2 showers.    Steps to follow:  Wash your hair with your normal shampoo and your body with regular soap and rinse well to remove shampoo and soap from your skin.  Wet a clean washcloth and turn off the shower.  Put CHG soap on washcloth and apply to your entire body from the neck down. Do not use on your head, face or private parts(genitals). Do not use CHG soap on open sores, wounds or areas of skin irritation.  Wash you body gently for 5 minutes. Do not wash your skin too hard. This soap does not create lather. Pay special attention to your underarms and from your belly button to your feet.  Turn the shower back on and rinse well to get CHG soap off your body.  Pat your skin dry with a clean, dry towel. Do not apply lotions or moisturizer.  Put on clean clothes and sleep on fresh bed sheets and do not allow pets to sleep with you.    Shower with CHG soap 2 times before your surgery  The evening before your surgery  The morning of your surgery      Tips to help prevent infections after your surgery:  Protect your surgical wound from germs:  Hand washing is the most  important thing you and your caregivers can do to prevent infections.  Keep your bandage clean and dry!  Do not touch your surgical wound.  Use  clean, freshly washed towels and washcloths every time you shower; do not share bath linens with others.  Until your surgical wound is healed, wear clothing and sleep on bed linens that are clean.  Do not allow pets to sleep in your bed with you or touch your surgical wound.  Do not smoke - smoking delays wound healing. This may be a good time to stop smoking.  If you have diabetes, it is important for you to manage your blood sugar levels properly before your surgery as well as after your surgery. Poorly managed blood sugar levels slow down wound healing and prevent you from healing completely.    Incentive Spirometer        Using the incentive spirometer helps expand the small air sacs of your lungs, helps you breathe deeply, and helps improve your lung function.  Use your incentive spirometer twice a day (10 breaths each time) prior to surgery.      How to Use Your Incentive Spirometer:  Hold the incentive spirometer in an upright position.   Breathe out as usual.   Place the mouthpiece in your mouth and seal your lips tightly around it.   Take a deep breath.  Breathe in slowly and as deeply as possible. Keep the blue flow rate guide between the arrows.   Hold your breath as long as possible. Then exhale slowly and allow the piston to fall to the bottom of the column.   Rest for a few seconds and repeat steps one through five at least 10 times.       Follow all instructions so your surgery won't be cancelled.  Please, be on time.                    If a situation occurs and you are delayed the day of surgery, call [phone]/ [phone].    If your physical condition changes (like a fever, cold, flu, etc.) call your surgeon as soon as possible.    Home medication(s) reviewed and verified via during PAT appointment/call.    The patient was contacted in person.     He verbalized understanding of all instructions does not need reinforcement.

## 2023-07-14 NOTE — Other (Signed)
PAT: 7-16 @ 1;30

## 2023-07-14 NOTE — Other (Signed)
EKG reviewed by Emily Fricker, NP.    EKG / PT OK for surgery per Emily.

## 2023-07-15 DIAGNOSIS — E113291 Type 2 diabetes mellitus with mild nonproliferative diabetic retinopathy without macular edema, right eye: Secondary | ICD-10-CM | POA: Diagnosis not present

## 2023-07-15 DIAGNOSIS — H04213 Epiphora due to excess lacrimation, bilateral lacrimal glands: Secondary | ICD-10-CM | POA: Diagnosis not present

## 2023-07-15 DIAGNOSIS — H20042 Secondary noninfectious iridocyclitis, left eye: Secondary | ICD-10-CM | POA: Diagnosis not present

## 2023-07-15 DIAGNOSIS — H40013 Open angle with borderline findings, low risk, bilateral: Secondary | ICD-10-CM | POA: Diagnosis not present

## 2023-07-15 LAB — COMPREHENSIVE METABOLIC PANEL
ALT: 30 U/L (ref 12–78)
AST: 21 U/L (ref 15–37)
Albumin/Globulin Ratio: 1.1 (ref 1.1–2.2)
Albumin: 3.6 g/dL (ref 3.5–5.0)
Alk Phosphatase: 91 U/L (ref 45–117)
Anion Gap: 9 mmol/L (ref 5–15)
BUN/Creatinine Ratio: 18 (ref 12–20)
BUN: 17 MG/DL (ref 6–20)
CO2: 22 mmol/L (ref 21–32)
Calcium: 9.1 MG/DL (ref 8.5–10.1)
Chloride: 106 mmol/L (ref 97–108)
Creatinine: 0.96 MG/DL (ref 0.70–1.30)
Est, Glom Filt Rate: 87 mL/min/{1.73_m2} (ref >60)
Globulin: 3.4 g/dL (ref 2.0–4.0)
Glucose: 92 mg/dL (ref 65–100)
Potassium: 4.1 mmol/L (ref 3.5–5.1)
Sodium: 137 mmol/L (ref 136–145)
Total Bilirubin: 0.4 MG/DL (ref 0.2–1.0)
Total Protein: 7 g/dL (ref 6.4–8.2)

## 2023-07-15 LAB — HEMOGLOBIN A1C
Estimated Avg Glucose: 120 mg/dL
Hemoglobin A1C: 5.8 % — ABNORMAL HIGH (ref 4.0–5.6)

## 2023-07-16 DIAGNOSIS — M5416 Radiculopathy, lumbar region: Secondary | ICD-10-CM | POA: Diagnosis not present

## 2023-07-21 DIAGNOSIS — M5416 Radiculopathy, lumbar region: Secondary | ICD-10-CM | POA: Diagnosis not present

## 2023-07-24 DIAGNOSIS — K56600 Partial intestinal obstruction, unspecified as to cause: Secondary | ICD-10-CM

## 2023-07-28 ENCOUNTER — Inpatient Hospital Stay: Admit: 2023-07-28 | Payer: MEDICARE

## 2023-07-28 ENCOUNTER — Inpatient Hospital Stay
Admission: RE | Admit: 2023-07-28 | Discharge: 2023-08-04 | Disposition: A | Discharge status: Home / Self Care | Payer: MEDICARE | Admitting: Colon & Rectal Surgery

## 2023-07-28 DIAGNOSIS — K56600 Partial intestinal obstruction, unspecified as to cause: Secondary | ICD-10-CM

## 2023-07-28 DIAGNOSIS — K433 Parastomal hernia with obstruction, without gangrene: Secondary | ICD-10-CM

## 2023-07-28 DIAGNOSIS — K5651 Intestinal adhesions [bands], with partial obstruction: Secondary | ICD-10-CM | POA: Diagnosis not present

## 2023-07-28 DIAGNOSIS — Z85048 Personal history of other malignant neoplasm of rectum, rectosigmoid junction, and anus: Secondary | ICD-10-CM | POA: Diagnosis not present

## 2023-07-28 DIAGNOSIS — I10 Essential (primary) hypertension: Secondary | ICD-10-CM | POA: Diagnosis not present

## 2023-07-28 DIAGNOSIS — E119 Type 2 diabetes mellitus without complications: Secondary | ICD-10-CM | POA: Diagnosis not present

## 2023-07-28 DIAGNOSIS — K567 Ileus, unspecified: Secondary | ICD-10-CM | POA: Diagnosis not present

## 2023-07-28 DIAGNOSIS — E785 Hyperlipidemia, unspecified: Secondary | ICD-10-CM | POA: Diagnosis not present

## 2023-07-28 DIAGNOSIS — G8918 Other acute postprocedural pain: Secondary | ICD-10-CM | POA: Diagnosis not present

## 2023-07-28 DIAGNOSIS — Z8719 Personal history of other diseases of the digestive system: Secondary | ICD-10-CM | POA: Diagnosis not present

## 2023-07-28 DIAGNOSIS — K9189 Other postprocedural complications and disorders of digestive system: Secondary | ICD-10-CM | POA: Diagnosis not present

## 2023-07-28 DIAGNOSIS — K219 Gastro-esophageal reflux disease without esophagitis: Secondary | ICD-10-CM | POA: Diagnosis not present

## 2023-07-28 DIAGNOSIS — K435 Parastomal hernia without obstruction or  gangrene: Secondary | ICD-10-CM | POA: Diagnosis not present

## 2023-07-28 LAB — POCT GLUCOSE
POC Glucose: 188 mg/dL — ABNORMAL HIGH (ref 65–117)
POC Glucose: 171 mg/dL — ABNORMAL HIGH (ref 65–117)

## 2023-07-28 LAB — BASIC METABOLIC PANEL
Anion Gap: 5 mmol/L (ref 5–15)
BUN/Creatinine Ratio: 14 (ref 12–20)
BUN: 13 MG/DL (ref 6–20)
CO2: 25 mmol/L (ref 21–32)
Calcium: 8.8 MG/DL (ref 8.5–10.1)
Chloride: 106 mmol/L (ref 97–108)
Creatinine: 0.93 MG/DL (ref 0.70–1.30)
Est, Glom Filt Rate: 90 mL/min/{1.73_m2} (ref >60)
Glucose: 197 mg/dL — ABNORMAL HIGH (ref 65–100)
Potassium: 4.6 mmol/L (ref 3.5–5.1)
Sodium: 136 mmol/L (ref 136–145)

## 2023-07-28 LAB — CBC
Hematocrit: 46.6 % (ref 36.6–50.3)
Hemoglobin: 14.5 g/dL (ref 12.1–17.0)
MCH: 24 PG — ABNORMAL LOW (ref 26.0–34.0)
MCHC: 31.1 g/dL (ref 30.0–36.5)
MCV: 77.3 FL — ABNORMAL LOW (ref 80.0–99.0)
MPV: 9.5 FL (ref 8.9–12.9)
Nucleated RBCs: 0 PER 100 WBC
Platelets: 213 10*3/uL (ref 150–400)
RBC: 6.03 M/uL — ABNORMAL HIGH (ref 4.10–5.70)
RDW: 17.8 % — ABNORMAL HIGH (ref 11.5–14.5)
WBC: 10 10*3/uL (ref 4.1–11.1)
nRBC: 0 10*3/uL (ref 0.00–0.01)

## 2023-07-28 LAB — PHOSPHORUS: Phosphorus: 3.2 MG/DL (ref 2.6–4.7)

## 2023-07-28 LAB — MAGNESIUM: Magnesium: 2.3 mg/dL (ref 1.6–2.4)

## 2023-07-28 MED ORDER — LACTATED RINGERS IV SOLN
INTRAVENOUS | Status: DC
Start: 2023-07-28 — End: 2023-07-28
  Administered 2023-07-28: 15:00:00 via INTRAVENOUS

## 2023-07-28 MED ORDER — NORMAL SALINE FLUSH 0.9 % IV SOLN
0.9 | INTRAVENOUS | Status: DC | PRN
Start: 2023-07-28 — End: 2023-08-04

## 2023-07-28 MED ORDER — ONDANSETRON HCL 4 MG/2ML IJ SOLN
4 | Freq: Four times a day (QID) | INTRAMUSCULAR | Status: DC | PRN
Start: 2023-07-28 — End: 2023-08-04
  Administered 2023-08-01 – 2023-08-03 (×2): 4 mg via INTRAVENOUS

## 2023-07-28 MED ORDER — SODIUM CHLORIDE 0.9 % IV SOLN
0.9 | INTRAVENOUS | Status: DC | PRN
Start: 2023-07-28 — End: 2023-07-28

## 2023-07-28 MED ORDER — FENTANYL CITRATE (PF) 100 MCG/2ML IJ SOLN
100 | INTRAMUSCULAR | Status: AC
Start: 2023-07-28 — End: ?

## 2023-07-28 MED ORDER — GABAPENTIN 300 MG PO CAPS
300 | Freq: Two times a day (BID) | ORAL | Status: DC
Start: 2023-07-28 — End: 2023-08-04
  Administered 2023-07-29 – 2023-08-04 (×14): 300 mg via ORAL

## 2023-07-28 MED ORDER — INSULIN LISPRO 100 UNIT/ML IJ SOLN
100 | Freq: Every evening | INTRAMUSCULAR | Status: DC
Start: 2023-07-28 — End: 2023-08-04

## 2023-07-28 MED ORDER — NALOXONE HCL 0.4 MG/ML IJ SOLN
0.4 | INTRAMUSCULAR | Status: DC | PRN
Start: 2023-07-28 — End: 2023-07-28

## 2023-07-28 MED ORDER — FENTANYL CITRATE (PF) 100 MCG/2ML IJ SOLN
100 | Freq: Once | INTRAMUSCULAR | Status: AC | PRN
Start: 2023-07-28 — End: 2023-07-28
  Administered 2023-07-28: 15:00:00 100 ug via INTRAVENOUS

## 2023-07-28 MED ORDER — DEXTROSE 10 % IV BOLUS
INTRAVENOUS | Status: DC | PRN
Start: 2023-07-28 — End: 2023-08-04

## 2023-07-28 MED ORDER — FENTANYL CITRATE (PF) 100 MCG/2ML IJ SOLN
100 | INTRAMUSCULAR | Status: DC | PRN
Start: 2023-07-28 — End: 2023-07-28
  Administered 2023-07-28 (×3): 25 ug via INTRAVENOUS

## 2023-07-28 MED ORDER — AMLODIPINE BESYLATE 5 MG PO TABS
5 | Freq: Every evening | ORAL | Status: DC
Start: 2023-07-28 — End: 2023-07-30
  Administered 2023-07-29: 01:00:00 5 mg via ORAL

## 2023-07-28 MED ORDER — KETAMINE HCL 50 MG/5ML IJ SOSY
50 | INTRAMUSCULAR | Status: AC
Start: 2023-07-28 — End: ?

## 2023-07-28 MED ORDER — MAGNESIUM SULFATE 2000 MG/50 ML IVPB PREMIX
2 | INTRAVENOUS | Status: AC
Start: 2023-07-28 — End: ?

## 2023-07-28 MED ORDER — HYDROMORPHONE HCL 1 MG/ML IJ SOLN
1 | INTRAMUSCULAR | Status: AC
Start: 2023-07-28 — End: ?

## 2023-07-28 MED ORDER — NORMAL SALINE FLUSH 0.9 % IV SOLN
0.9 | Freq: Two times a day (BID) | INTRAVENOUS | Status: DC
Start: 2023-07-28 — End: 2023-08-04
  Administered 2023-07-29 – 2023-08-04 (×12): 10 mL via INTRAVENOUS

## 2023-07-28 MED ORDER — BUPIVACAINE HCL (PF) 0.25 % IJ SOLN
0.25 | INTRAMUSCULAR | Status: AC
Start: 2023-07-28 — End: ?

## 2023-07-28 MED ORDER — MIDAZOLAM HCL (PF) 2 MG/2ML IJ SOLN
2 | Freq: Once | INTRAMUSCULAR | Status: AC
Start: 2023-07-28 — End: 2023-07-28
  Administered 2023-07-28: 15:00:00 2 mg via INTRAVENOUS

## 2023-07-28 MED ORDER — LIDOCAINE HCL (PF) 1 % IJ SOLN
1 | Freq: Once | INTRAMUSCULAR | Status: DC | PRN
Start: 2023-07-28 — End: 2023-07-28

## 2023-07-28 MED ORDER — CEFAZOLIN SODIUM 1 G IJ SOLR
1 | INTRAMUSCULAR | Status: AC
Start: 2023-07-28 — End: 2023-07-28
  Administered 2023-07-28 (×2): 2000 mg via INTRAVENOUS

## 2023-07-28 MED ORDER — NORMAL SALINE FLUSH 0.9 % IV SOLN
0.9 | Freq: Two times a day (BID) | INTRAVENOUS | Status: DC
Start: 2023-07-28 — End: 2023-07-28

## 2023-07-28 MED ORDER — DIPHENHYDRAMINE HCL 50 MG/ML IJ SOLN
50 | Freq: Once | INTRAMUSCULAR | Status: DC | PRN
Start: 2023-07-28 — End: 2023-07-28

## 2023-07-28 MED ORDER — PROPOFOL 100 MG/10ML IV EMUL
100 | INTRAVENOUS | Status: DC | PRN
Start: 2023-07-28 — End: 2023-07-28
  Administered 2023-07-28: 16:00:00 160 via INTRAVENOUS

## 2023-07-28 MED ORDER — ONDANSETRON HCL 4 MG/2ML IJ SOLN
4 | Freq: Four times a day (QID) | INTRAMUSCULAR | Status: DC | PRN
Start: 2023-07-28 — End: 2023-07-28

## 2023-07-28 MED ORDER — ACETAMINOPHEN 500 MG PO TABS
500 | Freq: Four times a day (QID) | ORAL | Status: DC
Start: 2023-07-28 — End: 2023-08-04
  Administered 2023-07-29 – 2023-08-04 (×23): 1000 mg via ORAL

## 2023-07-28 MED ORDER — SUGAMMADEX SODIUM 200 MG/2ML IV SOLN
200 MG/2ML | INTRAVENOUS | Status: DC | PRN
  Administered 2023-07-28: 20:00:00 200 via INTRAVENOUS

## 2023-07-28 MED ORDER — VALACYCLOVIR HCL 500 MG PO TABS
500 | Freq: Every day | ORAL | Status: DC | PRN
Start: 2023-07-28 — End: 2023-08-04

## 2023-07-28 MED ORDER — PROCHLORPERAZINE EDISYLATE 10 MG/2ML IJ SOLN
10 | Freq: Once | INTRAMUSCULAR | Status: AC | PRN
Start: 2023-07-28 — End: 2023-07-28
  Administered 2023-07-28: 20:00:00 5 mg via INTRAVENOUS

## 2023-07-28 MED ORDER — IPRATROPIUM-ALBUTEROL 0.5-2.5 (3) MG/3ML IN SOLN
Freq: Once | RESPIRATORY_TRACT | Status: DC | PRN
Start: 2023-07-28 — End: 2023-07-28

## 2023-07-28 MED ORDER — ALVIMOPAN 12 MG PO CAPS
12 | Freq: Two times a day (BID) | ORAL | Status: AC
Start: 2023-07-28 — End: 2023-08-01
  Administered 2023-07-29 – 2023-08-01 (×8): 12 mg via ORAL

## 2023-07-28 MED ORDER — INSULIN LISPRO 100 UNIT/ML IJ SOLN
100 | Freq: Three times a day (TID) | INTRAMUSCULAR | Status: DC
Start: 2023-07-28 — End: 2023-08-04

## 2023-07-28 MED ORDER — ALVIMOPAN 12 MG PO CAPS
12 | Freq: Once | ORAL | Status: AC
Start: 2023-07-28 — End: 2023-07-28
  Administered 2023-07-28: 15:00:00 12 mg via ORAL

## 2023-07-28 MED ORDER — CELECOXIB 100 MG PO CAPS
100 | Freq: Once | ORAL | Status: AC
Start: 2023-07-28 — End: 2023-07-28
  Administered 2023-07-28: 15:00:00 100 mg via ORAL

## 2023-07-28 MED ORDER — MAGNESIUM SULFATE 2 GM/50ML IV SOLN
2 | INTRAVENOUS | Status: DC | PRN
Start: 2023-07-28 — End: 2023-07-28
  Administered 2023-07-28: 16:00:00 2000 via INTRAVENOUS

## 2023-07-28 MED ORDER — CARVEDILOL 3.125 MG PO TABS
3.125 | Freq: Two times a day (BID) | ORAL | Status: DC
Start: 2023-07-28 — End: 2023-08-04
  Administered 2023-07-29 – 2023-08-03 (×11): 12.5 mg via ORAL

## 2023-07-28 MED ORDER — ALBUMIN HUMAN 5 % IV SOLN
5 % | INTRAVENOUS | Status: DC | PRN
  Administered 2023-07-28: 17:00:00 12.5 via INTRAVENOUS

## 2023-07-28 MED ORDER — ROCURONIUM BROMIDE 50 MG/5ML IV SOLN
50 | INTRAVENOUS | Status: DC | PRN
Start: 2023-07-28 — End: 2023-07-28
  Administered 2023-07-28: 16:00:00 45 via INTRAVENOUS
  Administered 2023-07-28: 16:00:00 5 via INTRAVENOUS
  Administered 2023-07-28: 17:00:00 10 via INTRAVENOUS

## 2023-07-28 MED ORDER — LACTATED RINGERS IV SOLN
INTRAVENOUS | Status: AC
Start: 2023-07-28 — End: 2023-07-29
  Administered 2023-07-29: 01:00:00 via INTRAVENOUS

## 2023-07-28 MED ORDER — SODIUM CHLORIDE (PF) 0.9 % IJ SOLN
0.9 | INTRAMUSCULAR | Status: DC | PRN
Start: 2023-07-28 — End: 2023-07-28

## 2023-07-28 MED ORDER — FENTANYL CITRATE PF 50 MCG/ML IJ SOSY
50 | INTRAMUSCULAR | Status: DC | PRN
Start: 2023-07-28 — End: 2023-07-28
  Administered 2023-07-28 (×2): 50 via INTRAVENOUS

## 2023-07-28 MED ORDER — ALBUMIN HUMAN 5 % IV SOLN
5 | INTRAVENOUS | Status: AC
Start: 2023-07-28 — End: ?

## 2023-07-28 MED ORDER — ONDANSETRON 4 MG PO TBDP
4 | Freq: Three times a day (TID) | ORAL | Status: DC | PRN
Start: 2023-07-28 — End: 2023-08-04

## 2023-07-28 MED ORDER — NORMAL SALINE FLUSH 0.9 % IV SOLN
0.9 | INTRAVENOUS | Status: DC | PRN
Start: 2023-07-28 — End: 2023-07-28

## 2023-07-28 MED ORDER — HYDROMORPHONE HCL PF 1 MG/ML IJ SOLN
1 | INTRAMUSCULAR | Status: DC | PRN
Start: 2023-07-28 — End: 2023-07-28

## 2023-07-28 MED ORDER — HYDROMORPHONE (PF) 10 MCG/ML-BUPIVACAINE 0.0625% NS EPIDURAL
Status: DC
Start: 2023-07-28 — End: 2023-07-28
  Administered 2023-07-28: 17:00:00 6 via EPIDURAL

## 2023-07-28 MED ORDER — DIPHENHYDRAMINE HCL 50 MG/ML IJ SOLN
50 | INTRAMUSCULAR | Status: DC | PRN
Start: 2023-07-28 — End: 2023-08-04

## 2023-07-28 MED ORDER — MIDAZOLAM HCL 2 MG/2ML IJ SOLN
2 | INTRAMUSCULAR | Status: AC
Start: 2023-07-28 — End: ?

## 2023-07-28 MED ORDER — PANTOPRAZOLE SODIUM 40 MG PO TBEC
40 | Freq: Two times a day (BID) | ORAL | Status: DC
Start: 2023-07-28 — End: 2023-08-04
  Administered 2023-07-29 – 2023-08-04 (×13): 40 mg via ORAL

## 2023-07-28 MED ORDER — GLYCOPYRROLATE 0.2 MG/ML IJ SOLN
0.2 | INTRAMUSCULAR | Status: DC | PRN
Start: 2023-07-28 — End: 2023-07-28
  Administered 2023-07-28: 17:00:00 .3 via INTRAVENOUS

## 2023-07-28 MED ORDER — MIDAZOLAM HCL (PF) 2 MG/2ML IJ SOLN
2 | Freq: Once | INTRAMUSCULAR | Status: AC | PRN
Start: 2023-07-28 — End: 2023-07-28
  Administered 2023-07-28: 15:00:00 2 mg via INTRAVENOUS

## 2023-07-28 MED ORDER — GLUCAGON (RDNA) 1 MG IJ KIT
1 | INTRAMUSCULAR | Status: DC | PRN
Start: 2023-07-28 — End: 2023-08-04

## 2023-07-28 MED ORDER — OXYCODONE HCL 5 MG PO TABS
5 | Freq: Once | ORAL | Status: DC | PRN
Start: 2023-07-28 — End: 2023-07-28

## 2023-07-28 MED ORDER — SUGAMMADEX SODIUM 200 MG/2ML IV SOLN
200 | INTRAVENOUS | Status: AC
Start: 2023-07-28 — End: ?

## 2023-07-28 MED ORDER — SODIUM CHLORIDE 0.9 % IV SOLN
0.9 | INTRAVENOUS | Status: DC | PRN
Start: 2023-07-28 — End: 2023-07-28
  Administered 2023-07-28: 16:00:00 30 via INTRAVENOUS

## 2023-07-28 MED ORDER — ROCURONIUM BROMIDE 50 MG/5ML IV SOLN
50 | INTRAVENOUS | Status: AC
Start: 2023-07-28 — End: ?

## 2023-07-28 MED ORDER — KETAMINE HCL 50 MG/5ML IJ SOSY
50 | INTRAMUSCULAR | Status: DC | PRN
Start: 2023-07-28 — End: 2023-07-28
  Administered 2023-07-28 (×2): 25 via INTRAVENOUS

## 2023-07-28 MED ORDER — IPRATROPIUM BROMIDE 0.06 % NA SOLN
0.06 | Freq: Two times a day (BID) | NASAL | Status: DC
Start: 2023-07-28 — End: 2023-08-04
  Administered 2023-07-29 – 2023-08-04 (×12): 2 via NASAL

## 2023-07-28 MED ORDER — ONDANSETRON HCL 4 MG/2ML IJ SOLN
4 MG/2ML | INTRAMUSCULAR | Status: DC | PRN
  Administered 2023-07-28: 20:00:00 4 via INTRAVENOUS

## 2023-07-28 MED ORDER — LISINOPRIL 20 MG PO TABS
20 | Freq: Every day | ORAL | Status: DC
Start: 2023-07-28 — End: 2023-07-30
  Administered 2023-07-29: 13:00:00 40 mg via ORAL

## 2023-07-28 MED ORDER — ACETAMINOPHEN 500 MG PO TABS
500 | Freq: Once | ORAL | Status: DC
Start: 2023-07-28 — End: 2023-07-28

## 2023-07-28 MED ORDER — ACETAMINOPHEN 500 MG PO TABS
500 | Freq: Once | ORAL | Status: AC
Start: 2023-07-28 — End: 2023-07-28
  Administered 2023-07-28: 15:00:00 1000 mg via ORAL

## 2023-07-28 MED ORDER — LORAZEPAM 2 MG/ML IJ SOLN
2 | Freq: Once | INTRAMUSCULAR | Status: DC | PRN
Start: 2023-07-28 — End: 2023-07-28

## 2023-07-28 MED ORDER — METRONIDAZOLE 500 MG/100ML IV SOLN
500 | INTRAVENOUS | Status: AC
Start: 2023-07-28 — End: 2023-07-28
  Administered 2023-07-28: 17:00:00 500 mg via INTRAVENOUS

## 2023-07-28 MED ORDER — DEXTROSE 10 % IV SOLN
10 | INTRAVENOUS | Status: DC | PRN
Start: 2023-07-28 — End: 2023-08-04

## 2023-07-28 MED ORDER — SODIUM CHLORIDE 0.9 % IV SOLN
0.9 | INTRAVENOUS | Status: DC | PRN
Start: 2023-07-28 — End: 2023-08-04

## 2023-07-28 MED ORDER — NALBUPHINE HCL 10 MG/ML IJ SOLN
10 | INTRAMUSCULAR | Status: DC | PRN
Start: 2023-07-28 — End: 2023-07-28

## 2023-07-28 MED ORDER — SUCCINYLCHOLINE CHLORIDE 20 MG/ML IJ SOLN
20 | INTRAMUSCULAR | Status: DC | PRN
Start: 2023-07-28 — End: 2023-07-28
  Administered 2023-07-28: 16:00:00 200 via INTRAVENOUS

## 2023-07-28 MED ORDER — GLUCOSE 4 G PO CHEW
4 | ORAL | Status: DC | PRN
Start: 2023-07-28 — End: 2023-08-04

## 2023-07-28 MED ORDER — ONDANSETRON HCL 4 MG/2ML IJ SOLN
4 | Freq: Once | INTRAMUSCULAR | Status: AC | PRN
Start: 2023-07-28 — End: 2023-07-28
  Administered 2023-07-28: 20:00:00 4 mg via INTRAVENOUS

## 2023-07-28 MED ORDER — HYDRALAZINE HCL 20 MG/ML IJ SOLN
20 | INTRAMUSCULAR | Status: DC | PRN
Start: 2023-07-28 — End: 2023-07-28

## 2023-07-28 MED ORDER — DEXAMETHASONE 4 MG/ML IJ SOLN (MIXTURES ONLY)
4 | INTRAMUSCULAR | Status: DC | PRN
Start: 2023-07-28 — End: 2023-07-28
  Administered 2023-07-28: 16:00:00 4 via INTRAVENOUS

## 2023-07-28 MED ORDER — TAMSULOSIN HCL 0.4 MG PO CAPS
0.4 | Freq: Every evening | ORAL | Status: DC
Start: 2023-07-28 — End: 2023-08-04
  Administered 2023-07-31 – 2023-08-04 (×5): 0.4 mg via ORAL

## 2023-07-28 MED FILL — METRONIDAZOLE 500 MG/100ML IV SOLN: 500 MG/100ML | INTRAVENOUS | Qty: 100

## 2023-07-28 MED FILL — ACETAMINOPHEN EXTRA STRENGTH 500 MG PO TABS: 500 MG | ORAL | Qty: 2

## 2023-07-28 MED FILL — NORMAL SALINE FLUSH 0.9 % IV SOLN: 0.9 % | INTRAVENOUS | Qty: 40

## 2023-07-28 MED FILL — KETAMINE HCL 50 MG/5ML IJ SOSY: 50 MG/5ML | INTRAMUSCULAR | Qty: 5

## 2023-07-28 MED FILL — BUPIVACAINE HCL (PF) 0.25 % IJ SOLN: 0.25 % | INTRAMUSCULAR | Qty: 30

## 2023-07-28 MED FILL — ALVIMOPAN 12 MG PO CAPS: 12 MG | ORAL | Qty: 1

## 2023-07-28 MED FILL — MIDAZOLAM HCL 2 MG/2ML IJ SOLN: 2 MG/ML | INTRAMUSCULAR | Qty: 2

## 2023-07-28 MED FILL — FENTANYL CITRATE (PF) 100 MCG/2ML IJ SOLN: 100 MCG/2ML | INTRAMUSCULAR | Qty: 2

## 2023-07-28 MED FILL — ALBUTEIN 5 % IV SOLN: 5 % | INTRAVENOUS | Qty: 250

## 2023-07-28 MED FILL — CEFAZOLIN SODIUM 1 G IJ SOLR: 1 g | INTRAMUSCULAR | Qty: 2000

## 2023-07-28 MED FILL — ONDANSETRON HCL 4 MG/2ML IJ SOLN: 4 MG/2ML | INTRAMUSCULAR | Qty: 2

## 2023-07-28 MED FILL — LACTATED RINGERS IV SOLN: INTRAVENOUS | Qty: 1000

## 2023-07-28 MED FILL — DEXTROSE 10 % IV SOLN: 10 % | INTRAVENOUS | Qty: 1000

## 2023-07-28 MED FILL — ROCURONIUM BROMIDE 50 MG/5ML IV SOLN: 50 MG/5ML | INTRAVENOUS | Qty: 5

## 2023-07-28 MED FILL — MAGNESIUM SULFATE 2 GM/50ML IV SOLN: 2 GM/50ML | INTRAVENOUS | Qty: 50

## 2023-07-28 MED FILL — NALBUPHINE HCL 10 MG/ML IJ SOLN: 10 MG/ML | INTRAMUSCULAR | Qty: 0.5

## 2023-07-28 MED FILL — HYDROMORPHONE HCL PF 500 MG/50ML IJ SOLN: 500 MG/50ML | INTRAMUSCULAR | Qty: 2.5

## 2023-07-28 MED FILL — HYDROMORPHONE HCL 1 MG/ML IJ SOLN: 1 MG/ML | INTRAMUSCULAR | Qty: 1

## 2023-07-28 MED FILL — BRIDION 200 MG/2ML IV SOLN: 200 MG/2ML | INTRAVENOUS | Qty: 4

## 2023-07-28 MED FILL — IPRATROPIUM BROMIDE 0.06 % NA SOLN: 0.06 % | NASAL | Qty: 1.5

## 2023-07-28 MED FILL — PROCHLORPERAZINE EDISYLATE 10 MG/2ML IJ SOLN: 10 MG/2ML | INTRAMUSCULAR | Qty: 2

## 2023-07-28 MED FILL — CELECOXIB 100 MG PO CAPS: 100 MG | ORAL | Qty: 1

## 2023-07-28 NOTE — Op Note (Signed)
To be dictated

## 2023-07-28 NOTE — Other (Addendum)
07/28/23 1250   Family Communication   Contact Person Relationship to Patient Spouse   Contact Person Phone Number Tsutomu, Pelley (Spouse) [phone]   Family/Significant Other Update Called   Delivery Origin Nurse   Message Disposition Family present - message delivered   Update Given Yes   Family Communication   Family Update Message Procedure started;Surgeon working;Patient stable     UPDATED FAMILY ABOUT THE PATIENT'S STATUS EVERY 2 HRS.

## 2023-07-28 NOTE — H&P (Signed)
Colorectal Surgery Pre-Operative History and Physical Examination Note          NAME:  Evan Shepherd   DOB:   Nov 13, 1956   MRN:   [id number]     Operation Date: 07/28/2023    Chief Complaint:  History of recurrent small bowel obstructions     History of Present Illness:    The patient is a 67 year old male who has had multiple abdominal and pelvic operations and multiple ER visits and hospitalizations for treatment of partial small bowel obstructions that seem to be occurring in the portion of the small intestine just proximal to an ileoileostomy that was created on 06/22/2006.  The most recent of these occurred on 04/01/2023.  He also now reports that he has been appreciating what could be a recurrence of his previously repaired paracolostomy hernia.      PMH:  Past Medical History:   Diagnosis Date    Arthritis     Dyslipidemia     Erectile dysfunction     Fecal incontinence     GERD (gastroesophageal reflux disease)     History of colonic polyps     History of rectal cancer 2006    Treated with neoadjuvant chemoradiation followed by surgery and then, presumably, adjuvant chemotherapy. The patient appears to have been cured.    History of small bowel obstruction     Multiple episodes treated non-operatively    Hypertension     S/P ablation of ventricular arrhythmia 09/26/2022    Type II diabetes mellitus (HCC)        PSH:  Past Surgical History:   Procedure Laterality Date    CARDIAC ELECTROPHYSIOLOGY MAPPING AND ABLATION  09/26/2022    [facility], [place], [place].    COLONOSCOPY  2006    COLONOSCOPY  11/28/2020    Tressia Danas, M.D. (in [place]) ? Four polypoid lesions were excised. Two proved to have been tubular adenomas and two were lymphoid aggregates. A five-year follow-up interval was recommended.    COLOSTOMY      HEENT Bilateral 2018    CATARACT SURGERY    HEMICOLECTOMY N/A 10/16/2022    Open lysis of adhesions, excision of coloanal J-pouch, colostomy revision, and repair of parastomal hernia  with Parietene DS mesh; Dr. Nanda Quinton.    IR PORT PLACEMENT EQUAL OR GREATER THAN 5 YEARS  2006    OTHER SURGICAL HISTORY  12/03/2021    Hand-assisted laparoscopic creation of divided loop colostomy; Dr. Nanda Quinton.    OTHER SURGICAL HISTORY  10/29/2005    Intersphincteric proctectomy with colonic J-pouch to anal anastomosis and diverting loop ileostomy Aleatha Borer, M.D., [facility])    OTHER SURGICAL HISTORY  06/22/2006    Closure of loop ileostomy and removal of subcutaneous infusion port Aleatha Borer, M.D., [facility])    OTHER SURGICAL HISTORY  11/01/2018    ?Hemorrhoidectomy? Rosalyn Charters, M.D. in [place]) ? This operation was really the excision of prolapsing colonic J pouch mucosa using the Ligasure and closure of the defects with 3-0 Vicryl sutures.    OTHER SURGICAL HISTORY  04/08/2022    Proctoplasty; Dr. Nanda Quinton    PR UNLISTED PROCEDURE ABDOMEN PERITONEUM & OMENTUM  2008    HERNIA REPAIR    UPPER GASTROINTESTINAL ENDOSCOPY  04/18/2020    EGD; Tressia Danas, MD.    URETER SURGERY Bilateral 10/16/2022    CYSTOSCOPY WITH INSERTION BILATERAL URETERAL CATHETERS performed by Eschenroeder, Doy Hutching, MD at [facility] MAIN OR    UROLOGICAL SURGERY  12/03/2021  Cystoscopy and placement of bilateral ureteral catheters; Dennard Nip, MD.    UROLOGICAL SURGERY  10/29/2009    Insertion of Coloplast three-piece inflatable penile prosthesis (Mark C. Vernie Ammons, M.D.)    WISDOM TOOTH EXTRACTION         Home Medications:  Cannot display prior to admission medications because the patient has not been admitted in this contact.       Hospital Medications:  No current facility-administered medications for this encounter.     Current Outpatient Medications   Medication Sig    tamsulosin (FLOMAX) 0.4 MG capsule Take 1 capsule by mouth nightly    carvedilol (COREG) 6.25 MG tablet Take 2 tablets by mouth 2 times daily (with meals)    TURMERIC PO Take 1 tablet by mouth daily    acetaminophen (TYLENOL) 500 MG  tablet Take 2 tablets by mouth every 6 hours    amLODIPine (NORVASC) 10 MG tablet Take 0.5 tablets by mouth every evening    Cholecalciferol 50 MCG (2000 UT) TABS Take 2.5 tablets by mouth every evening    empagliflozin (JARDIANCE) 25 MG tablet Take 1 tablet by mouth daily    gabapentin (NEURONTIN) 300 MG capsule Take 1 capsule by mouth 2 times daily.    insulin aspart (NOVOLOG) 100 UNIT/ML injection pen Inject 20 Units into the skin 3 times daily (before meals) 20 UNITS BEFORE BREAKFAST  35 UNITS BEFORE LUNCH  35 UNITS BEFORE DINNER    insulin glargine (LANTUS SOLOSTAR) 100 UNIT/ML injection pen Inject 60 Units into the skin nightly    ipratropium (ATROVENT) 0.06 % nasal spray 2 sprays by Nasal route 2 times daily    lisinopril (PRINIVIL;ZESTRIL) 40 MG tablet Take 1 tablet by mouth daily    metFORMIN (GLUCOPHAGE) 850 MG tablet Take 1 tablet by mouth 2 times daily (with meals)    pantoprazole (PROTONIX) 40 MG tablet Take 1 tablet by mouth 2 times daily    rosuvastatin (CRESTOR) 10 MG tablet Take 1 tablet by mouth nightly    valACYclovir (VALTREX) 1 g tablet Take 1 tablet by mouth as needed (COLD SORES)    zolpidem (AMBIEN) 10 MG tablet Take 1 tablet by mouth nightly.       Allergies:  Allergies   Allergen Reactions    Pork-Derived Products      THIS IS A RELIGIOUS PREFERENCE       Family History:  Family History   Problem Relation Age of Onset    Diabetes Mother     Hypertension Father     Stroke Father     Anesth Problems Neg Hx        Social History:  Social History     Tobacco Use    Smoking status: Some Days     Types: Cigars    Smokeless tobacco: Never   Substance Use Topics    Alcohol use: Not Currently       Review of Systems:    Symptom Y/N Comments  Symptom Y/N Comments   Fever/Chills N   Chest Pain N    Cough N   Abdominal Pain Y    Sputum N   Joint Pain     SOB/DOE N   Pruritis/Rash     Nausea/vomit Y   Tolerating PT/OT     Diarrhea N   Tolerating Diet Y    Constipation Y   Other       Could NOT obtain  due to:        Objective:  No data found.  No intake/output data recorded.  No intake/output data recorded.    PHYSICAL EXAMINATION:    GENERAL:  No distress.  HEENT:  Anicteric.  LUNGS:  Clear to auscultation bilaterally.  HEART:  Regular rate and rhythm with no murmurs, gallops, or rubs.  ABDOMEN:  Multiple scars. Left-sided colostomy.  EXTREMITIES:  No edema.  NEURO:  Alert and oriented.       Data Review    07/14/23 14:24   Sodium 137   Potassium 4.1   Chloride 106   CARBON DIOXIDE 22   BUN,BUNPL 17   Creatinine 0.96   Bun/Cre 18   Anion Gap 9   Est, Glom Filt Rate 87   Glucose 92   Calcium 9.1   Albumin/Globulin Ratio 1.1   Total Protein 7.0   Albumin 3.6   Globulin 3.4   Alkaline Phosphatase 91   ALT 30   AST 21   Total Bilirubin 0.4   Hemoglobin A1C 5.8 (H)   eAG (mg/dL) 161   WBC 7.4   RBC 0.96   Hemoglobin Quant 14.0   Hematocrit 43.8   MCV 77.1 (L)   MCH 24.6 (L)   MCHC 32.0   MPV 10.0   RDW 19.5 (H)   Platelet Count 285   (H): Data is abnormally high  (L): Data is abnormally low                      Assessment and Plan:      The patient has experienced multiple episodes of partial small bowel obstruction that are most likely adhesive in nature and that might be preventable by performing another lysis of adhesions and possibly revision of the ileoileal anastomosis.  In addition, if there is a recurrence of the parastomal hernia then another parastomal hernia repair would also be indicated.    We have discussed the risks of these procedures in detail and the patient has agreed to proceed.       Active Problems:    Partial small bowel obstruction (HCC)  Resolved Problems:    * No resolved hospital problems. *

## 2023-07-28 NOTE — Brief Op Note (Signed)
Brief Postoperative Note      Patient: Evan Shepherd  Date of Birth: [birth year]  MRN: [id number]    Date of Procedure: 08/24/2023    Pre-Op Diagnosis Codes:  History of small bowel obstruction [Z87.19];  Recurrent parastomal hernia  Post-Op Diagnosis:  History of small bowel obstruction.  Recurrent parastomal hernia.  Procedure:  Exploratory laparotomy, extensive lysis of adhesions, repair of recurrent paracolostomy hernia with Parietene DS mesh, and revision of ileoileostomy  Surgeon:  Theresia Majors, MD  Assistant:  Jill Side, SA  Anesthesia: General endotracheal and epidural  Specimens:   ID Type Source Tests Collected by Time Destination   ILEAL SEGMENT Tissue Ileum SURGICAL PATHOLOGY Theresia Majors, MD 08-24-2023 1443    Drains:  None  Estimated Blood Loss:  200 mL  Crystalloid:  1300 mL  Albumin:  250 mL  Urine:  350 mL  Complications:  None apparent.  The closing needle count was incorrect, but the abdominal radiographs revealed no foreign body other than the penile prosthesis hardware.  Implants:  Parietene DS mesh (12 cm circular prior to cutting)    Findings:  Infection Present At Time Of Surgery (PATOS) (choose all levels that have infection present):  No infection present  Other Findings:  Extensive, mostly thin adhesions.  A few, more dense adhesions.  No definite point of obstruction, but several acute angulations.  Healthy-appearing ileoileostomy invested with some adhesions and perhaps with a smaller luminal diameter than when it was created. Healthy-appearing colostomy with small paracolostomy hernia defect located superior to the bowel.  No evidence of  infection or malignancy.    Electronically signed by Jake Shark, MD

## 2023-07-28 NOTE — OR Nursing (Addendum)
SEPRAFILM X 3 WAS GIVEN TO THE STERILE FIELD TO BE USED BY MD DURING PROCEDURE  REF: [id number]  LOT: [id number]   EXP: 07/02/2025

## 2023-07-28 NOTE — Anesthesia Pre-Procedure Evaluation (Signed)
Department of Anesthesiology  Preprocedure Note       Name:  Evan Shepherd   Age:  67 y.o.  DOB:  [birth year]                                          MRN:  [id number]         Date:  07/28/2023      Surgeon: Moishe Spice):  Theresia Majors, MD    Procedure: Procedure(s):  EXPLORATORY LAPAROTOMY, LYSIS OF ADHESIONS, POSSIBLE SMALL BOWEL RESECTION, REPAIR PARASTOMAL HERNIA (E.R.A.S.) (ANES GENERAL/EPIDURAL)    Medications prior to admission:   Prior to Admission medications    Medication Sig Start Date End Date Taking? Authorizing Provider   tamsulosin (FLOMAX) 0.4 MG capsule Take 1 capsule by mouth nightly    [provider]   carvedilol (COREG) 6.25 MG tablet Take 2 tablets by mouth 2 times daily (with meals)    [provider]   TURMERIC PO Take 1 tablet by mouth daily    Automatic Reconciliation, Ar   acetaminophen (TYLENOL) 500 MG tablet Take 2 tablets by mouth every 6 hours 12/10/21   Automatic Reconciliation, Ar   amLODIPine (NORVASC) 10 MG tablet Take 0.5 tablets by mouth every evening    Automatic Reconciliation, Ar   Cholecalciferol 50 MCG (2000 UT) TABS Take 2.5 tablets by mouth every evening    Automatic Reconciliation, Ar   empagliflozin (JARDIANCE) 25 MG tablet Take 1 tablet by mouth daily    Automatic Reconciliation, Ar   gabapentin (NEURONTIN) 300 MG capsule Take 1 capsule by mouth 2 times daily.    Automatic Reconciliation, Ar   insulin aspart (NOVOLOG) 100 UNIT/ML injection pen Inject 20 Units into the skin 3 times daily (before meals) 20 UNITS BEFORE BREAKFAST  35 UNITS BEFORE LUNCH  35 UNITS BEFORE DINNER    Automatic Reconciliation, Ar   insulin glargine (LANTUS SOLOSTAR) 100 UNIT/ML injection pen Inject 60 Units into the skin nightly    Automatic Reconciliation, Ar   ipratropium (ATROVENT) 0.06 % nasal spray 2 sprays by Nasal route 2 times daily    Automatic Reconciliation, Ar   lisinopril (PRINIVIL;ZESTRIL) 40 MG tablet Take 1 tablet by mouth daily    Automatic  Reconciliation, Ar   metFORMIN (GLUCOPHAGE) 850 MG tablet Take 1 tablet by mouth 2 times daily (with meals)    Automatic Reconciliation, Ar   pantoprazole (PROTONIX) 40 MG tablet Take 1 tablet by mouth 2 times daily    Automatic Reconciliation, Ar   rosuvastatin (CRESTOR) 10 MG tablet Take 1 tablet by mouth nightly    Automatic Reconciliation, Ar   valACYclovir (VALTREX) 1 g tablet Take 1 tablet by mouth as needed (COLD SORES)    Automatic Reconciliation, Ar   zolpidem (AMBIEN) 10 MG tablet Take 1 tablet by mouth nightly.    Automatic Reconciliation, Ar       Current medications:    Current Facility-Administered Medications   Medication Dose Route Frequency Provider Last Rate Last Admin    sodium chloride flush 0.9 % injection 5-40 mL  5-40 mL IntraVENous 2 times per day Jake Shark A, MD        sodium chloride flush 0.9 % injection 5-40 mL  5-40 mL IntraVENous PRN Ghaemmaghami, Worthy Rancher, MD        0.9 % sodium chloride infusion   IntraVENous PRN Theresia Majors, MD  ceFAZolin (ANCEF) 2,000 mg in sterile water 20 mL IV syringe  2,000 mg IntraVENous On Call to OR Theresia Majors, MD        And    metroNIDAZOLE (FLAGYL) 500 mg in 0.9% NaCl 100 mL IVPB premix  500 mg IntraVENous On Call to OR Theresia Majors, MD        acetaminophen (TYLENOL) tablet 1,000 mg  1,000 mg Oral Once Theresia Majors, MD        celecoxib (CELEBREX) capsule 100 mg  100 mg Oral Once Theresia Majors, MD        alvimopan (ENTEREG) capsule 12 mg  12 mg Oral Once Theresia Majors, MD        lidocaine PF 1 % injection 1 mL  1 mL IntraDERmal Once PRN Bjorn Pippin, MD        acetaminophen (TYLENOL) tablet 1,000 mg  1,000 mg Oral Once Bjorn Pippin, MD        fentaNYL (SUBLIMAZE) injection 100 mcg  100 mcg IntraVENous Once PRN Bjorn Pippin, MD        lactated ringers IV soln infusion   IntraVENous Continuous Bjorn Pippin, MD        sodium chloride flush 0.9 % injection 5-40 mL  5-40 mL IntraVENous 2  times per day Bjorn Pippin, MD        sodium chloride flush 0.9 % injection 5-40 mL  5-40 mL IntraVENous PRN Bjorn Pippin, MD        0.9 % sodium chloride infusion   IntraVENous PRN Bjorn Pippin, MD        midazolam PF (VERSED) injection 2 mg  2 mg IntraVENous Once PRN Bjorn Pippin, MD           Allergies:    Allergies   Allergen Reactions    Pork-Derived Products      THIS IS A RELIGIOUS PREFERENCE       Problem List:    Patient Active Problem List   Diagnosis Code    Fecal incontinence R15.9    Type II diabetes mellitus (HCC) E11.9    Hypertension I10    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 The patient appears to have been cured.    History of small bowel obstruction     Multiple episodes treated non-operatively    Hypertension     S/P ablation of ventricular arrhythmia 09/26/2022    Type II diabetes mellitus [facility])        Past Surgical History:        Procedure Laterality Date    CARDIAC ELECTROPHYSIOLOGY MAPPING AND ABLATION  09/26/2022    [facility], [place], [place].    COLONOSCOPY  2006    COLONOSCOPY  11/28/2020    Tressia Danas, M.D. (in [place]) ? Four polypoid lesions were excised. Two proved to have been tubular adenomas and two were lymphoid aggregates. A five-year follow-up  interval was recommended.    COLOSTOMY      HEENT Bilateral 2018    CATARACT SURGERY    HEMICOLECTOMY N/A 10/16/2022    Open lysis of adhesions, excision of coloanal J-pouch, colostomy revision, and repair of parastomal hernia with Parietene DS mesh; Dr. Nanda Quinton.    IR PORT PLACEMENT EQUAL OR  GREATER THAN 5 YEARS  2006    OTHER SURGICAL HISTORY  12/03/2021    Hand-assisted laparoscopic creation of divided loop colostomy; Dr. Nanda Quinton.    OTHER SURGICAL HISTORY  10/29/2005    Intersphincteric proctectomy with colonic J-pouch to anal anastomosis and diverting loop ileostomy Aleatha Borer, M.D., [facility])    OTHER SURGICAL HISTORY  06/22/2006    Closure of loop ileostomy and removal of subcutaneous infusion port Aleatha Borer, M.D., [facility])    OTHER SURGICAL HISTORY  11/01/2018    ?Hemorrhoidectomy? Rosalyn Charters, M.D. in [place]) ? This operation was really the excision of prolapsing colonic J pouch mucosa using the Ligasure and closure of the defects with 3-0 Vicryl sutures.    OTHER SURGICAL HISTORY  04/08/2022    Proctoplasty; Dr. Nanda Quinton    PR UNLISTED PROCEDURE ABDOMEN PERITONEUM & OMENTUM  2008    HERNIA REPAIR    UPPER GASTROINTESTINAL ENDOSCOPY  04/18/2020    EGD; Tressia Danas, MD.    URETER SURGERY Bilateral 10/16/2022    CYSTOSCOPY WITH INSERTION BILATERAL URETERAL CATHETERS performed by Eschenroeder, Doy Hutching, MD at [facility] MAIN OR    UROLOGICAL SURGERY  12/03/2021    Cystoscopy and placement of bilateral ureteral catheters; Dennard Nip, MD.    UROLOGICAL SURGERY  10/29/2009    Insertion of Coloplast three-piece inflatable penile prosthesis (Mark C. Vernie Ammons, M.D.)    WISDOM TOOTH EXTRACTION         Social History:    Social History     Tobacco Use    Smoking status: Some Days     Types: Cigars    Smokeless tobacco: Never   Substance Use Topics    Alcohol use: Not Currently                                Ready to quit: Not Answered  Counseling given: Not Answered      Vital Signs (Current):   Vitals:    07/28/23 1033   Pulse: 62                                              BP Readings from Last 3 Encounters:   07/14/23 109/62   10/23/22 97/71       NPO Status:                                                                                 BMI:   Wt Readings from  Last 3 Encounters:   07/14/23 99.8 kg (220 lb)   10/23/22 95.8 kg (211 lb 4.8 oz)     There is no height or weight on file to  calculate BMI.    CBC:   Lab Results   Component Value Date/Time    WBC 7.4 07/14/2023 02:24 PM    RBC 5.68 07/14/2023 02:24 PM    HGB 14.0 07/14/2023 02:24 PM    HCT 43.8 07/14/2023 02:24 PM    MCV 77.1 07/14/2023 02:24 PM    RDW 19.5 07/14/2023 02:24 PM    PLT 285 07/14/2023 02:24 PM       CMP:   Lab Results   Component Value Date/Time    NA 137 07/14/2023 02:24 PM    K 4.1 07/14/2023 02:24 PM    CL 106 07/14/2023 02:24 PM    CO2 22 07/14/2023 02:24 PM    BUN 17 07/14/2023 02:24 PM    CREATININE 0.96 07/14/2023 02:24 PM    AGRATIO 0.7 12/06/2021 04:43 AM    LABGLOM 87 07/14/2023 02:24 PM    LABGLOM >60 10/23/2022 12:12 AM    LABGLOM >60 04/09/2022 02:50 AM    GLUCOSE 92 07/14/2023 02:24 PM    CALCIUM 9.1 07/14/2023 02:24 PM    BILITOT 0.4 07/14/2023 02:24 PM    ALKPHOS 91 07/14/2023 02:24 PM    ALKPHOS 56 12/06/2021 04:43 AM    AST 21 07/14/2023 02:24 PM    ALT 30 07/14/2023 02:24 PM       POC Tests: No results for input(s): "POCGLU", "POCNA", "POCK", "POCCL", "POCBUN", "POCHEMO", "POCHCT" in the last 72 hours.    Coags: No results found for: "PROTIME", "INR", "APTT"    HCG (If Applicable): No results found for: "PREGTESTUR", "PREGSERUM", "HCG", "HCGQUANT"     ABGs: No results found for: "PHART", "PO2ART", "PCO2ART", "HCO3ART", "BEART", "O2SATART"     Type & Screen (If Applicable):  No results found for: "LABABO"    Drug/Infectious Status (If Applicable):  No results found for: "HIV", "HEPCAB"    COVID-19 Screening (If Applicable): No results found for: "COVID19"        Anesthesia Evaluation  Patient summary reviewed and Nursing notes reviewed  Airway: Mallampati: II  TM distance: >3 FB   Neck ROM: full  Mouth opening: > = 3 FB   Dental: normal exam         Pulmonary:Negative Pulmonary ROS and normal exam                               Cardiovascular:  Exercise tolerance: good (>4 METS)  (+)  hypertension:                  Neuro/Psych:   Negative Neuro/Psych ROS              GI/Hepatic/Renal:   (+) GERD:          Endo/Other:    (+) DiabetesType II DM.                 Abdominal: normal exam            Vascular: negative vascular ROS.         Other Findings:             Anesthesia Plan      general     ASA 2       Induction: intravenous.    MIPS: Postoperative opioids intended and Prophylactic antiemetics administered.  Anesthetic plan and risks discussed with patient.      Plan discussed with CRNA.    Attending anesthesiologist reviewed and agrees with Preprocedure  content      Post-op pain plan if not by surgeon: continuous epidural            Rometta Emery, MD   07/28/2023

## 2023-07-28 NOTE — OR Nursing (Signed)
DISCREPANCY IN SUTURE COUNT DURING CLOSURE REPORTED TO SURGEON AND REQUESTED AN ABDOMINAL XRAY FOR CONFIRMATION ON MISSING COUNT.  ABDOMINAL XRAY DONE AND READ BY RADIOLOGY WITH NO SUTURE AND INSTRUMENT LEFT IN THE ABDOMEN PRIOR TO FINAL CLOSING. DISCREPANCY IN COUNTS ARE AMENDED PRIOR TO SKIN CLOSURE.

## 2023-07-28 NOTE — Anesthesia Post-Procedure Evaluation (Signed)
Post-Anesthesia Evaluation and Assessment    Patient: Dawon Sokolowski MRN: [id number]  SSN: [id number]    Date of Birth: [birth year]  Age: 67 y.o.  Sex: male      I have evaluated the patient and they are stable and ready for discharge from the PACU.     Cardiovascular Function/Vital Signs  Visit Vitals  BP (!) 126/56   Pulse 67   Temp 98.5 F (36.9 C) (Axillary)   Resp 18   Ht 1.753 m (5\' 9" )   Wt 99.8 kg (220 lb)   SpO2 93%   BMI 32.49 kg/m       Patient is status post General anesthesia for Procedure(s):  EXPLORATORY LAPAROTOMY, EXTENSIVE LYSIS OF ADHESIONS, REPAIR OF RECURENT PARAHERNIA WITH MESH AND REVISION OF ILEOSTOMY (E.R.A.S.) (ANES GENERAL/EPIDURAL).    Nausea/Vomiting: None    Postoperative hydration reviewed and adequate.    Pain:      Managed    Neurological Status:       At baseline    Mental Status, Level of Consciousness: Alert and  oriented to person, place, and time    Pulmonary Status:       Adequate oxygenation and airway patent    Complications related to anesthesia: None    Post-anesthesia assessment completed. No concerns    Signed By: Rometta Emery, MD     July 28, 2023            Department of Anesthesiology  Postprocedure Note    Patient: Trevaun Chrisman  MRN: [id number]  Birthdate: 1956-02-13  Date of evaluation: 07/28/2023    Procedure Summary       Date: 07/28/23 Room / Location: [facility] MAIN OR 20 / [facility] MAIN OR    Anesthesia Start: 1154 Anesthesia Stop: 1615    Procedure: EXPLORATORY LAPAROTOMY, EXTENSIVE LYSIS OF ADHESIONS, REPAIR OF RECURENT PARAHERNIA WITH MESH AND REVISION OF ILEOSTOMY (E.R.A.S.) (ANES GENERAL/EPIDURAL) (Abdomen) Diagnosis:       History of small bowel obstruction      Parastomal hernia with obstruction and without gangrene      (History of small bowel obstruction [Z87.19])      (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

## 2023-07-28 NOTE — Anesthesia Procedure Notes (Signed)
Epidural Block    Patient location during procedure: pre-op  Start time: 07/28/2023 11:18 AM  End time: 07/28/2023 11:30 AM  Reason for block: procedure for pain  Staffing  Anesthesiologist: Bjorn Pippin, MD  Performed by: Bjorn Pippin, MD  Authorized by: Bjorn Pippin, MD    Epidural  Patient position: sitting  Prep: DuraPrep and site prepped and draped  Patient monitoring: cardiac monitor, continuous pulse ox and frequent blood pressure checks  Approach: midline  Location: T11-12  Injection technique: LOR air  Provider prep: mask and sterile gloves  Needle  Needle type: Tuohy   Needle gauge: 17 G  Needle length: 3.5 in  Needle insertion depth: 8 cm  Catheter type: end hole  Catheter size: 19 G  Catheter at skin depth: 13 cmCatheter Secured: tegaderm and tape  Assessment  Hemodynamics: stable  Attempts: 2  Outcomes: uncomplicated

## 2023-07-28 NOTE — Other (Signed)
TRANSFER - OUT REPORT:    Verbal report given to Byrd Hesselbach, RN(name) on [employer]  being transferred to 519(unit) for routine post-op       Report consisted of patient's Situation, Background, Assessment and   Recommendations(SBAR).     Time Pre op antibiotic given:1230  Anesthesia Stop time: 1615    Information from the following report(s) SBAR, OR Summary, Procedure Summary, Intake/Output, MAR, and Cardiac Rhythm NSR  was reviewed with the receiving nurse.    Opportunity for questions and clarification was provided.     Is the patient on 02? Yes       L/Min 2         Other n/a    Is the patient on a monitor? No    Is the nurse transporting with the patient? No    Surgical Waiting Area notified of patient's transfer from PACU? Yes

## 2023-07-29 LAB — BASIC METABOLIC PANEL
Anion Gap: 8 mmol/L (ref 5–15)
BUN/Creatinine Ratio: 18 (ref 12–20)
BUN: 14 MG/DL (ref 6–20)
CO2: 21 mmol/L (ref 21–32)
Calcium: 8.7 MG/DL (ref 8.5–10.1)
Chloride: 104 mmol/L (ref 97–108)
Creatinine: 0.76 MG/DL (ref 0.70–1.30)
Est, Glom Filt Rate: >90 mL/min/{1.73_m2} (ref >60)
Glucose: 154 mg/dL — ABNORMAL HIGH (ref 65–100)
Potassium: 4.6 mmol/L (ref 3.5–5.1)
Sodium: 133 mmol/L — ABNORMAL LOW (ref 136–145)

## 2023-07-29 LAB — POCT GLUCOSE
POC Glucose: 176 mg/dL — ABNORMAL HIGH (ref 65–117)
POC Glucose: 135 mg/dL — ABNORMAL HIGH (ref 65–117)
POC Glucose: 160 mg/dL — ABNORMAL HIGH (ref 65–117)
POC Glucose: 171 mg/dL — ABNORMAL HIGH (ref 65–117)

## 2023-07-29 LAB — CBC WITH AUTO DIFFERENTIAL
Basophils %: 0 % (ref 0–1)
Basophils Absolute: 0 10*3/uL (ref 0.0–0.1)
Eosinophils %: 0 % (ref 0–7)
Eosinophils Absolute: 0 10*3/uL (ref 0.0–0.4)
Hematocrit: 45.7 % (ref 36.6–50.3)
Hemoglobin: 14.2 g/dL (ref 12.1–17.0)
Immature Granulocytes %: 0 % (ref 0.0–0.5)
Immature Granulocytes Absolute: 0 10*3/uL (ref 0.00–0.04)
Lymphocytes %: 21 % (ref 12–49)
Lymphocytes Absolute: 1.9 10*3/uL (ref 0.8–3.5)
MCH: 24.1 PG — ABNORMAL LOW (ref 26.0–34.0)
MCHC: 31.1 g/dL (ref 30.0–36.5)
MCV: 77.6 FL — ABNORMAL LOW (ref 80.0–99.0)
MPV: 10 FL (ref 8.9–12.9)
Monocytes %: 9 % (ref 5–13)
Monocytes Absolute: 0.8 10*3/uL (ref 0.0–1.0)
Neutrophils %: 70 % (ref 32–75)
Neutrophils Absolute: 6.3 10*3/uL (ref 1.8–8.0)
Nucleated RBCs: 0 PER 100 WBC
Platelets: 214 10*3/uL (ref 150–400)
RBC: 5.89 M/uL — ABNORMAL HIGH (ref 4.10–5.70)
RDW: 17.7 % — ABNORMAL HIGH (ref 11.5–14.5)
WBC: 9.1 10*3/uL (ref 4.1–11.1)
nRBC: 0 10*3/uL (ref 0.00–0.01)

## 2023-07-29 LAB — PHOSPHORUS: Phosphorus: 2.6 MG/DL (ref 2.6–4.7)

## 2023-07-29 LAB — MAGNESIUM: Magnesium: 1.9 mg/dL (ref 1.6–2.4)

## 2023-07-29 MED ORDER — SODIUM CHLORIDE 0.9 % IV BOLUS
0.9 | Freq: Once | INTRAVENOUS | Status: AC
Start: 2023-07-29 — End: 2023-07-29
  Administered 2023-07-29: 23:00:00 1000 mL via INTRAVENOUS

## 2023-07-29 MED ORDER — MECLIZINE HCL 12.5 MG PO TABS
12.5 | Freq: Three times a day (TID) | ORAL | Status: DC | PRN
Start: 2023-07-29 — End: 2023-08-04
  Administered 2023-07-29: 18:00:00 12.5 mg via ORAL

## 2023-07-29 MED ORDER — INSULIN GLARGINE 100 UNIT/ML SC SOLN
100 | Freq: Every evening | SUBCUTANEOUS | Status: DC
Start: 2023-07-29 — End: 2023-07-30
  Administered 2023-07-30: 02:00:00 25 [IU] via SUBCUTANEOUS

## 2023-07-29 MED ORDER — HYDROMORPHONE (PF) 10 MCG/ML-BUPIVACAINE 0.0625% NS EPIDURAL
Status: DC
Start: 2023-07-29 — End: 2023-07-30

## 2023-07-29 MED FILL — ALVIMOPAN 12 MG PO CAPS: 12 MG | ORAL | Qty: 1

## 2023-07-29 MED FILL — GABAPENTIN 300 MG PO CAPS: 300 MG | ORAL | Qty: 1

## 2023-07-29 MED FILL — CARVEDILOL 3.125 MG PO TABS: 3.125 MG | ORAL | Qty: 4

## 2023-07-29 MED FILL — MECLIZINE HCL 12.5 MG PO TABS: 12.5 MG | ORAL | Qty: 1

## 2023-07-29 MED FILL — LISINOPRIL 20 MG PO TABS: 20 MG | ORAL | Qty: 2

## 2023-07-29 MED FILL — ACETAMINOPHEN EXTRA STRENGTH 500 MG PO TABS: 500 MG | ORAL | Qty: 2

## 2023-07-29 MED FILL — SODIUM CHLORIDE 0.9 % IV SOLN: 0.9 % | INTRAVENOUS | Qty: 1000

## 2023-07-29 MED FILL — AMLODIPINE BESYLATE 5 MG PO TABS: 5 MG | ORAL | Qty: 1

## 2023-07-29 MED FILL — HYDROMORPHONE (PF) 10 MCG/ML-BUPIVACAINE 0.0625% NS EPIDURAL: Qty: 250

## 2023-07-29 MED FILL — PANTOPRAZOLE SODIUM 40 MG PO TBEC: 40 MG | ORAL | Qty: 1

## 2023-07-29 NOTE — Plan of Care (Signed)
Problem: Physical Therapy - Adult  Goal: By Discharge: Performs mobility at highest level of function for planned discharge setting.  See evaluation for individualized goals.  Description: FUNCTIONAL STATUS PRIOR TO ADMISSION: Patient was independent and active without use of DME.    HOME SUPPORT PRIOR TO ADMISSION: The patient lived with wife but did not require assistance.    Physical Therapy Goals  Initiated 07/29/2023  1.  Patient will move from supine to sit and sit to supine, scoot up and down, and roll side to side in bed with independence within 7 day(s).    2.  Patient will perform sit to stand with independence within 7 day(s).  3.  Patient will transfer from bed to chair and chair to bed with independence using the least restrictive device within 7 day(s).  4.  Patient will ambulate with independence for 300 feet with the least restrictive device within 7 day(s).   5.  Patient will ascend/descend 13 stairs with 1 handrail(s) with modified independence within 7 day(s).    Outcome: Progressing     PHYSICAL THERAPY EVALUATION    Patient: Evan Shepherd (67 y.o. 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]  Partial small bowel obstruction (HCC) [K56.600]  Procedure(s) (LRB):  EXPLORATORY LAPAROTOMY, EXTENSIVE LYSIS OF ADHESIONS, REPAIR OF RECURENT PARAHERNIA WITH MESH AND REVISION OF ILEOSTOMY (E.R.A.S.) (ANES GENERAL/EPIDURAL) (N/A) 1 Day Post-Op   Precautions:                        ASSESSMENT :   DEFICITS/IMPAIRMENTS:   The patient is limited by decreased functional mobility, independence in ADLs, high-level IADLs, strength, body mechanics, activity tolerance, endurance, safety awareness, balance, increased pain levels     Based on the impairments listed above pt admitted with exploratory lap of hernia/bowel obstruction, lysis of adhesions, repair of parahernia near colostomy POD #1. Pt is noted  to have increased fear of mobility and required increased education initially for technique, benefits greatly from rationale and education. Pt tolerates log roll, no s/s of OHTN, and able to perform gait training for 369ft. Pt educated on safety and decreased abdominal bracing during sitting, cued for breathing during transitions. Will continue to follow and progress as able    Patient will benefit from skilled intervention to address the above impairments.    Functional Outcome Measure:  The patient scored 23/24 on the AMPAC outcome measure which is indicative of low complexity.           PLAN :  Recommendations and Planned Interventions:   bed mobility training, transfer training, gait training, therapeutic exercises, neuromuscular re-education, patient and family training/education, and therapeutic activities    Frequency/Duration: Patient will be followed by physical therapy to address goals, PT Plan of Care: 5 times/week to address goals.    Recommendation for discharge: (in order for the patient to meet his/her long term goals): No skilled physical therapy    Other factors to consider for discharge: no additional factors    IF patient discharges home will need the following DME: continuing to assess with progress                SUBJECTIVE:   Patient stated "I want to do well."    OBJECTIVE DATA SUMMARY:       Past Medical History:   Diagnosis Date    Arthritis     Dyslipidemia  Erectile dysfunction     Fecal incontinence     GERD (gastroesophageal reflux disease)     History of colonic polyps     History of rectal cancer 2006    Treated with neoadjuvant chemoradiation followed by surgery and then, presumably, adjuvant chemotherapy. The patient appears to have been cured.    History of small bowel obstruction     Multiple episodes treated non-operatively    Hypertension     S/P ablation of ventricular arrhythmia 09/26/2022    Type II diabetes mellitus [facility])      Past Surgical History:   Procedure Laterality  Date    CARDIAC ELECTROPHYSIOLOGY MAPPING AND ABLATION  09/26/2022    [facility], [place], [place].    COLONOSCOPY  2006    COLONOSCOPY  11/28/2020    Tressia Danas, M.D. (in [place]) ? Four polypoid lesions were excised. Two proved to have been tubular adenomas and two were lymphoid aggregates. A five-year follow-up interval was recommended.    COLOSTOMY      HEENT Bilateral 2018    CATARACT SURGERY    HEMICOLECTOMY N/A 10/16/2022    Open lysis of adhesions, excision of coloanal J-pouch, colostomy revision, and repair of parastomal hernia with Parietene DS mesh; Dr. Nanda Quinton.    IR PORT PLACEMENT EQUAL OR GREATER THAN 5 YEARS  2006    OTHER SURGICAL HISTORY  12/03/2021    Hand-assisted laparoscopic creation of divided loop colostomy; Dr. Nanda Quinton.    OTHER SURGICAL HISTORY  10/29/2005    Intersphincteric proctectomy with colonic J-pouch to anal anastomosis and diverting loop ileostomy Aleatha Borer, M.D., [facility])    OTHER SURGICAL HISTORY  06/22/2006    Closure of loop ileostomy and removal of subcutaneous infusion port Aleatha Borer, M.D., [facility])    OTHER SURGICAL HISTORY  11/01/2018    ?Hemorrhoidectomy? Rosalyn Charters, M.D. in [place]) ? This operation was really the excision of prolapsing colonic J pouch mucosa using the Ligasure and closure of the defects with 3-0 Vicryl sutures.    OTHER SURGICAL HISTORY  04/08/2022    Proctoplasty; Dr. Nanda Quinton    PR UNLISTED PROCEDURE ABDOMEN PERITONEUM & OMENTUM  2008    HERNIA REPAIR    UPPER GASTROINTESTINAL ENDOSCOPY  04/18/2020    EGD; Tressia Danas, MD.    URETER SURGERY Bilateral 10/16/2022    CYSTOSCOPY WITH INSERTION BILATERAL URETERAL CATHETERS performed by Eschenroeder, Doy Hutching, MD at [facility] MAIN OR    UROLOGICAL SURGERY  12/03/2021    Cystoscopy and placement of bilateral ureteral catheters; Dennard Nip, MD.    UROLOGICAL SURGERY  10/29/2009    Insertion of Coloplast three-piece inflatable penile prosthesis (Mark C.  Vernie Ammons, M.D.)    WISDOM TOOTH EXTRACTION         Home Situation:  Social/Functional History  Lives With: Spouse  Type of Home: House  Home Layout: Two level, Able to Live on Main level with bedroom/bathroom  Receives Help From: Family  ADL Assistance: Independent  Homemaking Assistance: Independent  Homemaking Responsibilities: No  Ambulation Assistance: Independent  Transfer Assistance: Surveyor, minerals: Yes  Mode of Transportation: Company secretary Status:          Skin: intact    Edema: none    Hearing:        Vision/Perceptual:                  Strength:    Strength: Generally decreased, functional    Tone & Sensation:  Coordination:  Coordination: Within functional limits    Range Of Motion:  AROM: Within functional limits  PROM: Within functional limits    Functional Mobility:  Bed Mobility:     Bed Mobility Training  Bed Mobility Training: Yes  Overall Level of Assistance: Contact-guard assistance  Rolling: Assist X1;Contact-guard assistance  Supine to Sit: Assist X1;Contact-guard assistance (increased cues for log rolling)  Scooting: Assist X1;Stand-by assistance  Transfers:     Art therapist: Yes  Overall Level of Assistance: Supervision;Contact-guard assistance  Sit to Stand: Supervision;Contact-guard assistance  Stand to Sit: Supervision;Contact-guard assistance  Balance:               Balance  Sitting: Intact  Standing: Impaired;With support  Standing - Static: Good;Constant support  Standing - Dynamic: Constant support;Good;Fair  Ambulation/Gait Training:                       Gait  Gait Training: Yes  Overall Level of Assistance: Stand-by assistance;Contact-guard assistance (IV pole for support)  Distance (ft): 340 Feet  Assistive Device: Other (comment) (IV pole)  Base of Support: Widened  Speed/Cadence: Slow;Shuffled  Step Length: Left shortened;Right shortened                                                                                                                                                                                                                                             [employer] AM-PAC      Basic Mobility Inpatient Short Form (6-Clicks) Version 2  How much HELP from another person do you currently need... (If the patient hasn't done an activity recently, how much help from another person do you think they would need if they tried?) Total A Lot A Little None   1.  Turning from your back to your side while in a flat bed without using bedrails? []   1 []   2 []   3  [x]   4   2.  Moving from lying on your back to sitting on the side of a flat bed without using bedrails? []   1 []   2 []   3  [x]   4   3.  Moving to and from a bed to a chair (including a wheelchair)? []   1 []   2 []   3  [x]   4   4. Standing up from  a chair using your arms (e.g. wheelchair or bedside chair)? []   1 []   2 []   3  [x]   4   5.  Walking in hospital room? []   1 []   2 []   3  [x]   4   6.  Climbing 3-5 steps with a railing? []   1 []   2 [x]   3  []   4     Raw Score: 23/24                            Cutoff score ?171,2,3 had higher odds of discharging home with home health or need of SNF/IPR.    1. Emelia Loron, Janeece Riggers, Vinoth Fransico Meadow, Lupe Carney Passek, Thornton Dales. Cassandria Anger.  Validity of the AM-PAC "6-Clicks" Inpatient Daily Activity and Basic Mobility Short Forms. Physical Therapy Mar 2014, [phone]) 379-391; DOI: 10.2522/ptj.20130199  2. Venetia Night. Association of AM-PAC "6-Clicks" Basic Mobility and Daily Activity Scores With Discharge Destination. Phys Ther. 2021 Apr 4;101(4):pzab043. doi: 10.1093/ptj/pzab043. PMID: 16109604.  3. Herbold J, Rajaraman D, Lubertha Basque, Agayby K, Bushnell S. Activity Measure for Post-Acute Care "6-Clicks" Basic Mobility Scores Predict Discharge Destination After Acute Care Hospitalization in Select Patient Groups: A Retrospective, Observational Study. Arch Rehabil Res Clin Transl.  2022 Jul 16;4(3):100204. doi: 10.1016/j.arrct.5409.811914. PMID: 78295621; PMCID: HYQ6578469.  4. Josefina Do, Coster W, Ni P. AM-PAC Short Forms Manual 4.0. Revised 01/2019.                                                                                                                                                                                                                              Pain Rating:       Activity Tolerance:   Good and Fair     After treatment:   Patient left in no apparent distress sitting up in chair, Call bell within reach, and Caregiver / family present    COMMUNICATION/EDUCATION:   The patient's plan of care was discussed with: registered nurse and case manager         Thank you for this referral.  Edgardo Roys, PT  Minutes: 33      Physical Therapy Evaluation Charge Determination   History Examination Presentation Decision-Making   HIGH Complexity :3+ comorbidities / personal factors will impact the outcome/ POC  MEDIUM Complexity : 3 Standardized tests  and measures addressin body structure, function, activity limitation and / or participation in recreation  LOW Complexity : Stable, uncomplicated  AM-PAC  LOW    Based on the above components, the patient evaluation is determined to be of the following complexity level: Low

## 2023-07-29 NOTE — Progress Notes (Signed)
General Daily Progress Note    Admission Date: 07/28/2023  Hospital Day 2  Post-Op Day 1  Subjective:   Not nauseated, but not really interested in advancing the diet yet.  Has passed some gas from the colostomy.  Pain control is adequate.  The Foley catheter is not bothering him.  Hasn't been out of bed yet.  Requested prn medication for vertigo.        Objective:   Patient Vitals for the past 24 hrs:   BP Temp Temp src Pulse Resp SpO2 Height Weight   07/29/23 0814 107/64 -- Oral 61 14 94 % -- --   07/29/23 0336 111/87 98.1 F (36.7 C) Oral 64 18 96 % -- --   07/28/23 2343 129/78 97.9 F (36.6 C) Oral 65 -- 96 % -- --   07/28/23 2130 (!) 148/76 -- -- 67 -- 96 % -- --   07/28/23 1950 137/66 97.5 F (36.4 C) Oral 62 18 96 % -- --   07/28/23 1745 (!) 138/58 97.5 F (36.4 C) -- 66 14 98 % -- --   07/28/23 1715 -- -- -- 67 18 93 % -- --   07/28/23 1710 -- -- -- 67 17 94 % -- --   07/28/23 1705 -- -- -- 66 16 95 % -- --   07/28/23 1700 (!) 126/56 98.5 F (36.9 C) Axillary 67 17 93 % -- --   07/28/23 1655 -- -- -- 69 18 94 % -- --   07/28/23 1650 -- -- -- 70 19 (!) 88 % -- --   07/28/23 1645 (!) 139/56 -- -- 72 21 (!) 87 % -- --   07/28/23 1640 -- -- -- 75 20 92 % -- --   07/28/23 1635 -- -- -- 79 19 93 % -- --   07/28/23 1630 (!) 167/66 -- -- 85 13 96 % -- --   07/28/23 1625 (!) 139/53 -- -- 76 17 97 % -- --   07/28/23 1620 139/68 -- -- 69 20 97 % -- --   07/28/23 1615 137/65 98.5 F (36.9 C) Axillary 74 23 97 % -- --   07/28/23 1610 133/66 -- -- -- -- -- -- --   07/28/23 1145 (!) 122/58 -- -- 60 21 -- -- --   07/28/23 1135 139/69 -- -- -- -- -- -- --   07/28/23 1130 119/70 -- -- -- -- -- -- --   07/28/23 1037 (!) 135/92 97.9 F (36.6 C) Oral 62 16 99 % 1.753 m (5\' 9" ) 99.8 kg (220 lb)   07/28/23 1033 -- -- -- 62 -- -- -- --     No intake/output data recorded.  07/29 1901 - 07/31 0700  In: 1548.2 [I.V.:1548.2]  Out: 1650 [Urine:1450]      Physical Examination:  General Appearance - No distress.  Abdomen - Soft.   Mildly distended.  Appropriately tender.  Dressing clean dry and intact.  Colostomy viable; some gas in the appliance.  GU - Foley catheter draining normal-appearing urine.  Extremities - Pneumatic compression stockings in use.  No calf tenderness.         Data Review   Recent Results (from the past 24 hour(s))   POCT Glucose    Collection Time: 07/28/23 10:58 AM   Result Value Ref Range    POC Glucose 188 (H) 65 - 117 mg/dL    Performed by: Debby Bud    CBC without Diff    Collection Time: 07/28/23  4:56 PM   Result Value Ref Range    WBC 10.0 4.1 - 11.1 K/uL    RBC 6.03 (H) 4.10 - 5.70 M/uL    Hemoglobin 14.5 12.1 - 17.0 g/dL    Hematocrit 40.9 81.1 - 50.3 %    MCV 77.3 (L) 80.0 - 99.0 FL    MCH 24.0 (L) 26.0 - 34.0 PG    MCHC 31.1 30.0 - 36.5 g/dL    RDW 91.4 (H) 78.2 - 14.5 %    Platelets 213 150 - 400 K/uL    MPV 9.5 8.9 - 12.9 FL    Nucleated RBCs 0.0 0 PER 100 WBC    nRBC 0.00 0.00 - 0.01 K/uL   Basic Metabolic Panel    Collection Time: 07/28/23  4:56 PM   Result Value Ref Range    Sodium 136 136 - 145 mmol/L    Potassium 4.6 3.5 - 5.1 mmol/L    Chloride 106 97 - 108 mmol/L    CO2 25 21 - 32 mmol/L    Anion Gap 5 5 - 15 mmol/L    Glucose 197 (H) 65 - 100 mg/dL    BUN 13 6 - 20 MG/DL    Creatinine 9.56 2.13 - 1.30 MG/DL    BUN/Creatinine Ratio 14 12 - 20      Est, Glom Filt Rate 90 >60 ml/min/1.55m2    Calcium 8.8 8.5 - 10.1 MG/DL   Magnesium    Collection Time: 07/28/23  4:56 PM   Result Value Ref Range    Magnesium 2.3 1.6 - 2.4 mg/dL   Phosphorus    Collection Time: 07/28/23  4:56 PM   Result Value Ref Range    Phosphorus 3.2 2.6 - 4.7 MG/DL   POCT Glucose    Collection Time: 07/28/23  4:57 PM   Result Value Ref Range    POC Glucose 171 (H) 65 - 117 mg/dL    Performed by: Vita Barley    POCT Glucose    Collection Time: 07/28/23  9:43 PM   Result Value Ref Range    POC Glucose 176 (H) 65 - 117 mg/dL    Performed by: Mohammed Kindle    Basic Metabolic Panel    Collection Time: 07/29/23  5:40 AM    Result Value Ref Range    Sodium 133 (L) 136 - 145 mmol/L    Potassium 4.6 3.5 - 5.1 mmol/L    Chloride 104 97 - 108 mmol/L    CO2 21 21 - 32 mmol/L    Anion Gap 8 5 - 15 mmol/L    Glucose 154 (H) 65 - 100 mg/dL    BUN 14 6 - 20 MG/DL    Creatinine 0.86 5.78 - 1.30 MG/DL    BUN/Creatinine Ratio 18 12 - 20      Est, Glom Filt Rate >90 >60 ml/min/1.63m2    Calcium 8.7 8.5 - 10.1 MG/DL   Magnesium    Collection Time: 07/29/23  5:40 AM   Result Value Ref Range    Magnesium 1.9 1.6 - 2.4 mg/dL   Phosphorus    Collection Time: 07/29/23  5:40 AM   Result Value Ref Range    Phosphorus 2.6 2.6 - 4.7 MG/DL   CBC with Auto Differential    Collection Time: 07/29/23  5:40 AM   Result Value Ref Range    WBC 9.1 4.1 - 11.1 K/uL    RBC 5.89 (H) 4.10 - 5.70 M/uL    Hemoglobin 14.2  12.1 - 17.0 g/dL    Hematocrit 65.7 84.6 - 50.3 %    MCV 77.6 (L) 80.0 - 99.0 FL    MCH 24.1 (L) 26.0 - 34.0 PG    MCHC 31.1 30.0 - 36.5 g/dL    RDW 96.2 (H) 95.2 - 14.5 %    Platelets 214 150 - 400 K/uL    MPV 10.0 8.9 - 12.9 FL    Nucleated RBCs 0.0 0 PER 100 WBC    nRBC 0.00 0.00 - 0.01 K/uL    Neutrophils % 70 32 - 75 %    Lymphocytes % 21 12 - 49 %    Monocytes % 9 5 - 13 %    Eosinophils % 0 0 - 7 %    Basophils % 0 0 - 1 %    Immature Granulocytes % 0 0.0 - 0.5 %    Neutrophils Absolute 6.3 1.8 - 8.0 K/UL    Lymphocytes Absolute 1.9 0.8 - 3.5 K/UL    Monocytes Absolute 0.8 0.0 - 1.0 K/UL    Eosinophils Absolute 0.0 0.0 - 0.4 K/UL    Basophils Absolute 0.0 0.0 - 0.1 K/UL    Immature Granulocytes Absolute 0.0 0.00 - 0.04 K/UL    Differential Type AUTOMATED     POCT Glucose    Collection Time: 07/29/23  6:10 AM   Result Value Ref Range    POC Glucose 135 (H) 65 - 117 mg/dL    Performed by: Mohammed Kindle            Assessment:     Principal Problem:    Partial small bowel obstruction (HCC)  Resolved Problems:    * No resolved hospital problems. *      1 Day Post-Op s/p  exploratory laparotomy, extensive lysis of adhesions, repair of recurrent  paracolostomy hernia with Parietene DS mesh, and revision of ileoileostomy.    Hemodynamically stable.  Hemoglobin stable.  Good urine output.  Creatinine within normal limits.        Plan:   Continue clear liquid diet and nutritional supplements for now.  Continue Foley catheter until tomorrow to reduce the chances of epidural analgesia-related urinary retention.  Ambulate.  Physical therapy.  Incentive spirometer.

## 2023-07-29 NOTE — Care Coordination-Inpatient (Signed)
Care Management Initial Assessment       RUR:9%  Readmission? No  1st IM letter given? Yes - 7/31  1st [employer] letter given: No     CM met with patient/spouse at bedside to introduce self and role.     ADLs: Independent  DME: None  PCP follow up: Dr. Georgann Housekeeper, [facility] [phone]  Previous Home Health: No  Previous Skilled Nursing Facility: No  Previous Inpatient Rehab: No  Insurance verified: [facility] Medicare  [facility]: [facility] [place], [place], [place]  Emergency Contact: Damani Kelemen [phone]    CM will follow patient progress and assist as needed with [facility] plan.        07/29/23 1205   Service Assessment   Patient Orientation Alert and Oriented   Cognition Alert   History Provided By Patient   Primary Caregiver Self   Support Systems Spouse/Significant Other;Family Members   Patient's Healthcare Decision Maker is: Legal Next of Kin   PCP Verified by CM Yes  (Dr. Natalia [facility] [phone])   Last Visit to PCP Within last 3 months   Prior Functional Level Independent in ADLs/IADLs   Current Functional Level Independent in ADLs/IADLs   Can patient return to prior living arrangement Yes   Ability to make needs known: Good   Family able to assist with home care needs: Yes   Would you like for me to discuss the discharge plan with any other family members/significant others, and if so, who? Yes  (Karima Gilchrest(Spouse))   Financial Resources Medicare   Social/Functional History   Lives With Spouse   Type of Home House   Home Layout Two level;Able to Live on Main level with bedroom/bathroom   Receives Help From Family   ADL Assistance Independent   Homemaking Assistance Independent   Homemaking Responsibilities No   Ambulation Assistance Independent   Copy Yes   Mode of Economist   Type of Residence House   Living Arrangements Spouse/Significant Other   Potential Assistance Needed N/A   DME Ordered? No    Potential Assistance Purchasing Medications No   Patient expects to be discharged to: House   One/Two Story Residence One Chief Financial Officer At/After Discharge   Mode of Transport at Discharge Other (see comment)  (Spouse)   Confirm Follow Up Transport Self     Cheree Ditto, RN/CRM  [phone]

## 2023-07-29 NOTE — Plan of Care (Signed)
Problem: Chronic Conditions and Co-morbidities  Goal: Patient's chronic conditions and co-morbidity symptoms are monitored and maintained or improved  Outcome: Progressing     Problem: Pain  Goal: Verbalizes/displays adequate comfort level or baseline comfort level  Outcome: Progressing     Problem: Discharge Planning  Goal: Discharge to home or other facility with appropriate resources  Outcome: Progressing     Problem: Safety - Adult  Goal: Free from fall injury  Outcome: Progressing

## 2023-07-30 LAB — BASIC METABOLIC PANEL
Anion Gap: 5 mmol/L (ref 5–15)
BUN/Creatinine Ratio: 13 (ref 12–20)
BUN: 10 MG/DL (ref 6–20)
CO2: 27 mmol/L (ref 21–32)
Calcium: 8.1 MG/DL — ABNORMAL LOW (ref 8.5–10.1)
Chloride: 106 mmol/L (ref 97–108)
Creatinine: 0.79 MG/DL (ref 0.70–1.30)
Est, Glom Filt Rate: >90 mL/min/{1.73_m2} (ref >60)
Glucose: 186 mg/dL — ABNORMAL HIGH (ref 65–100)
Potassium: 4 mmol/L (ref 3.5–5.1)
Sodium: 138 mmol/L (ref 136–145)

## 2023-07-30 LAB — CBC WITH AUTO DIFFERENTIAL
Basophils %: 0 % (ref 0–1)
Basophils Absolute: 0 10*3/uL (ref 0.0–0.1)
Eosinophils %: 2 % (ref 0–7)
Eosinophils Absolute: 0.2 10*3/uL (ref 0.0–0.4)
Hematocrit: 40.8 % (ref 36.6–50.3)
Hemoglobin: 13.1 g/dL (ref 12.1–17.0)
Immature Granulocytes %: 0 % (ref 0.0–0.5)
Immature Granulocytes Absolute: 0 10*3/uL (ref 0.00–0.04)
Lymphocytes %: 20 % (ref 12–49)
Lymphocytes Absolute: 1.4 10*3/uL (ref 0.8–3.5)
MCH: 24.8 PG — ABNORMAL LOW (ref 26.0–34.0)
MCHC: 32.1 g/dL (ref 30.0–36.5)
MCV: 77.3 FL — ABNORMAL LOW (ref 80.0–99.0)
MPV: 10.4 FL (ref 8.9–12.9)
Monocytes %: 8 % (ref 5–13)
Monocytes Absolute: 0.5 10*3/uL (ref 0.0–1.0)
Neutrophils %: 70 % (ref 32–75)
Neutrophils Absolute: 4.9 10*3/uL (ref 1.8–8.0)
Nucleated RBCs: 0 PER 100 WBC
Platelets: 189 10*3/uL (ref 150–400)
RBC: 5.28 M/uL (ref 4.10–5.70)
RDW: 17.4 % — ABNORMAL HIGH (ref 11.5–14.5)
WBC: 7 10*3/uL (ref 4.1–11.1)
nRBC: 0 10*3/uL (ref 0.00–0.01)

## 2023-07-30 LAB — POCT GLUCOSE
POC Glucose: 150 mg/dL — ABNORMAL HIGH (ref 65–117)
POC Glucose: 113 mg/dL (ref 65–117)
POC Glucose: 136 mg/dL — ABNORMAL HIGH (ref 65–117)
POC Glucose: 140 mg/dL — ABNORMAL HIGH (ref 65–117)

## 2023-07-30 LAB — PHOSPHORUS: Phosphorus: 2.2 MG/DL — ABNORMAL LOW (ref 2.6–4.7)

## 2023-07-30 MED ORDER — OXYCODONE HCL 5 MG PO TABS
5 | ORAL | Status: DC | PRN
Start: 2023-07-30 — End: 2023-08-04
  Administered 2023-07-30 – 2023-08-04 (×17): 10 mg via ORAL

## 2023-07-30 MED ORDER — LACTATED RINGERS IV SOLN
INTRAVENOUS | Status: AC
Start: 2023-07-30 — End: 2023-07-31
  Administered 2023-07-30: 05:00:00 via INTRAVENOUS

## 2023-07-30 MED ORDER — INSULIN GLARGINE 100 UNIT/ML SC SOLN
100 | Freq: Every evening | SUBCUTANEOUS | Status: DC
Start: 2023-07-30 — End: 2023-08-04
  Administered 2023-07-31: 02:00:00 30 [IU] via SUBCUTANEOUS
  Administered 2023-08-01: 02:00:00 35 [IU] via SUBCUTANEOUS
  Administered 2023-08-02: 01:00:00 40 [IU] via SUBCUTANEOUS
  Administered 2023-08-03 – 2023-08-04 (×2): 45 [IU] via SUBCUTANEOUS

## 2023-07-30 MED ORDER — OXYCODONE HCL 5 MG PO TABS
5 | ORAL | Status: DC | PRN
Start: 2023-07-30 — End: 2023-08-04
  Administered 2023-07-30 – 2023-08-03 (×3): 5 mg via ORAL

## 2023-07-30 MED ORDER — HYDROMORPHONE HCL PF 1 MG/ML IJ SOLN
1 | INTRAMUSCULAR | Status: DC | PRN
Start: 2023-07-30 — End: 2023-08-04
  Administered 2023-07-30: 19:00:00 0.5 mg via INTRAVENOUS

## 2023-07-30 MED ORDER — LISINOPRIL 20 MG PO TABS
20 | Freq: Every day | ORAL | Status: DC
Start: 2023-07-30 — End: 2023-08-04
  Administered 2023-07-30 – 2023-08-04 (×5): 40 mg via ORAL

## 2023-07-30 MED ORDER — AMLODIPINE BESYLATE 5 MG PO TABS
5 | Freq: Every evening | ORAL | Status: DC
Start: 2023-07-30 — End: 2023-08-04
  Administered 2023-07-30 – 2023-08-03 (×5): 5 mg via ORAL

## 2023-07-30 MED FILL — OXYCODONE HCL 5 MG PO TABS: 5 MG | ORAL | Qty: 1

## 2023-07-30 MED FILL — GABAPENTIN 300 MG PO CAPS: 300 MG | ORAL | Qty: 1

## 2023-07-30 MED FILL — TAMSULOSIN HCL 0.4 MG PO CAPS: 0.4 MG | ORAL | Qty: 1

## 2023-07-30 MED FILL — ACETAMINOPHEN EXTRA STRENGTH 500 MG PO TABS: 500 MG | ORAL | Qty: 2

## 2023-07-30 MED FILL — CARVEDILOL 3.125 MG PO TABS: 3.125 MG | ORAL | Qty: 4

## 2023-07-30 MED FILL — PANTOPRAZOLE SODIUM 40 MG PO TBEC: 40 MG | ORAL | Qty: 1

## 2023-07-30 MED FILL — ALVIMOPAN 12 MG PO CAPS: 12 MG | ORAL | Qty: 1

## 2023-07-30 MED FILL — HYDROMORPHONE HCL 1 MG/ML IJ SOLN: 1 MG/ML | INTRAMUSCULAR | Qty: 1

## 2023-07-30 MED FILL — LISINOPRIL 20 MG PO TABS: 20 MG | ORAL | Qty: 2

## 2023-07-30 MED FILL — OXYCODONE HCL 5 MG PO TABS: 5 MG | ORAL | Qty: 2

## 2023-07-30 MED FILL — LACTATED RINGERS IV SOLN: INTRAVENOUS | Qty: 1000

## 2023-07-30 MED FILL — AMLODIPINE BESYLATE 5 MG PO TABS: 5 MG | ORAL | Qty: 1

## 2023-07-30 MED FILL — LANTUS 100 UNIT/ML SC SOLN: 100 UNIT/ML | SUBCUTANEOUS | Qty: 25

## 2023-07-30 NOTE — Progress Notes (Incomplete)
General Daily Progress Note    Admission Date: 07/28/2023  Hospital Day {0-10:33138}  Post-Op Day {0-10:33138}  Subjective:             Objective:   Patient Vitals for the past 24 hrs:   BP Temp Temp src Pulse Resp SpO2   07/30/23 0837 125/69 98.6 F (37 C) Oral 72 16 91 %   07/30/23 0415 -- -- -- -- 15 --   07/30/23 0340 (!) 112/56 98.8 F (37.1 C) Oral 69 15 93 %   07/29/23 2350 (!) 96/46 -- -- -- -- --   07/29/23 2340 (!) 82/50 98.4 F (36.9 C) Oral 68 14 95 %   07/29/23 2130 (!) 98/55 -- -- -- -- --   07/29/23 2028 (!) 101/51 -- -- -- -- --   07/29/23 1953 -- 98.6 F (37 C) Oral 68 12 90 %   07/29/23 1841 (!) 84/48 -- -- -- -- --   07/29/23 1840 (!) 88/49 -- -- -- -- --   07/29/23 1607 101/64 -- -- 68 -- --     No intake/output data recorded.  07/30 1901 - 08/01 0700  In: 363 [I.V.:363]  Out: 3650 [Urine:3650]    Physical Examination: {male adult master:310786}   Physical Examination: {male adult master:310785}             Data Review   Recent Results (from the past 24 hour(s))   POCT Glucose    Collection Time: 07/29/23 11:49 AM   Result Value Ref Range    POC Glucose 160 (H) 65 - 117 mg/dL    Performed by: Charlsie Merles    POCT Glucose    Collection Time: 07/29/23  4:17 PM   Result Value Ref Range    POC Glucose 171 (H) 65 - 117 mg/dL    Performed by: Charlsie Merles    POCT Glucose    Collection Time: 07/29/23  9:20 PM   Result Value Ref Range    POC Glucose 150 (H) 65 - 117 mg/dL    Performed by: Jomarie Longs    POCT Glucose    Collection Time: 07/30/23  6:31 AM   Result Value Ref Range    POC Glucose 113 65 - 117 mg/dL    Performed by: Jomarie Longs            Assessment:     Principal Problem:    Partial small bowel obstruction (HCC)  Resolved Problems:    * No resolved hospital problems. *      2 Days Post-Op     Plan:   Ambulate ***  Incentive spirometer ***  Wound care ***  Advance diet ***    Low fiber diabetic dioet.  Increase Lantus.  rEmove epiduural catheter.  Remove Foley catheter.

## 2023-07-30 NOTE — Plan of Care (Signed)
Problem: Physical Therapy - Adult  Goal: By Discharge: Performs mobility at highest level of function for planned discharge setting.  See evaluation for individualized goals.  Description: FUNCTIONAL STATUS PRIOR TO ADMISSION: Patient was independent and active without use of DME.    HOME SUPPORT PRIOR TO ADMISSION: The patient lived with wife but did not require assistance.    Physical Therapy Goals  Initiated 07/29/2023  1.  Patient will move from supine to sit and sit to supine, scoot up and down, and roll side to side in bed with independence within 7 day(s).    2.  Patient will perform sit to stand with independence within 7 day(s).  3.  Patient will transfer from bed to chair and chair to bed with independence using the least restrictive device within 7 day(s).  4.  Patient will ambulate with independence for 300 feet with the least restrictive device within 7 day(s).   5.  Patient will ascend/descend 13 stairs with 1 handrail(s) with modified independence within 7 day(s).    Outcome: Progressing     PHYSICAL THERAPY TREATMENT    Patient: Evan Shepherd (67 y.o. 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 Pt performed bed mobility at supervision level, able to complete gait training for 2 laps with and without UE support. Pt able to navigate stairs with railing x3. Pt continues to be mildly anxious regarding pain and medication complications. Pt tolerates movement well, one more session  planned tomorrow for comfort and to increase confidence.          PLAN:  Patient continues to benefit from skilled intervention to address the above impairments.  Continue treatment per established plan of care.    Recommendation for discharge: (in order for the patient to meet his/her long term goals): No skilled physical therapy    Other factors to consider for discharge: no additional factors    IF patient discharges home will need the following DME: none       SUBJECTIVE:   Patient stated, "I am much better, but I didn't think to ask for the pain pills, so it was a lot today."    OBJECTIVE DATA SUMMARY:   Critical Behavior:          Functional Mobility Training:  Bed Mobility:  Bed Mobility Training  Bed Mobility Training: Yes  Overall Level of Assistance: Contact-guard assistance  Rolling: Supervision;Stand-by assistance  Supine to Sit: Stand-by assistance;Supervision  Sit to Supine: Stand-by assistance;Supervision  Scooting: Stand-by assistance;Supervision  Transfers:  Art therapist: Yes  Overall Level of Assistance: Supervision  Sit to Stand: Supervision  Stand to Sit: Supervision  Balance:  Balance  Sitting: Intact  Standing: Intact;Without support  Standing - Static: Good;Constant support  Standing - Dynamic: Constant support;Good   Ambulation/Gait Training:     Gait  Gait Training: Yes  Overall Level of Assistance: Supervision  Distance (ft): 680 Feet  Assistive Device: Other (comment) (IV pole for  one lap, no AD for one lap)  Base of Support: Widened  Speed/Cadence: Slow;Shuffled  Step Length: Left shortened;Right shortened      Pain Rating:  8/10, Pt pain appears stable with use of FLACC objective measurement pain scale:  Face 0, Legs 0, Activity 0, Cry 1, Consolability 1 for a score of 2/10    Face  0 No particular expression or smile 1 Occasional grimace or frown, withdrawn, uninterested 2 Frequent to constant quivering chin, clenched jaw  Legs  0 Normal position or relaxed  1  Uneasy, restless, tense      2 Kicking, or legs drawn up  Activity 0 Lying quietly/normally, moves easily 1 Squirming, shifting, back and forth, tense   2 Arched, rigid or jerking  Cry  0 No cry (awake or asleep)   1 Moans or whimpers; occasional complaint    2 Crying steadily, screams or sobs, frequent complaints  Consolability  0 Content, relaxed    1 Reassured by touching, being talked to, distractible  2 Difficult to console or comfort       Pain Intervention(s):       Activity Tolerance:   Good    After treatment:   Patient left in no apparent distress sitting up in chair and Call bell within reach      COMMUNICATION/EDUCATION:   The patient's plan of care was discussed with: registered nurse and case manager           Edgardo Roys, PT  Minutes: [phone]

## 2023-07-31 LAB — POCT GLUCOSE
POC Glucose: 205 mg/dL — ABNORMAL HIGH (ref 65–117)
POC Glucose: 101 mg/dL (ref 65–117)
POC Glucose: 172 mg/dL — ABNORMAL HIGH (ref 65–117)
POC Glucose: 149 mg/dL — ABNORMAL HIGH (ref 65–117)

## 2023-07-31 LAB — CBC WITH AUTO DIFFERENTIAL
Basophils %: 0 % (ref 0–1)
Basophils Absolute: 0 10*3/uL (ref 0.0–0.1)
Eosinophils %: 4 % (ref 0–7)
Eosinophils Absolute: 0.3 10*3/uL (ref 0.0–0.4)
Hematocrit: 41.5 % (ref 36.6–50.3)
Hemoglobin: 12.8 g/dL (ref 12.1–17.0)
Immature Granulocytes %: 0 % (ref 0.0–0.5)
Immature Granulocytes Absolute: 0 10*3/uL (ref 0.00–0.04)
Lymphocytes %: 29 % (ref 12–49)
Lymphocytes Absolute: 1.9 10*3/uL (ref 0.8–3.5)
MCH: 24.2 PG — ABNORMAL LOW (ref 26.0–34.0)
MCHC: 30.8 g/dL (ref 30.0–36.5)
MCV: 78.3 FL — ABNORMAL LOW (ref 80.0–99.0)
MPV: 10.1 FL (ref 8.9–12.9)
Monocytes %: 8 % (ref 5–13)
Monocytes Absolute: 0.5 10*3/uL (ref 0.0–1.0)
Neutrophils %: 59 % (ref 32–75)
Neutrophils Absolute: 4 10*3/uL (ref 1.8–8.0)
Nucleated RBCs: 0 PER 100 WBC
Platelets: 183 10*3/uL (ref 150–400)
RBC: 5.3 M/uL (ref 4.10–5.70)
RDW: 17 % — ABNORMAL HIGH (ref 11.5–14.5)
WBC: 6.8 10*3/uL (ref 4.1–11.1)
nRBC: 0 10*3/uL (ref 0.00–0.01)

## 2023-07-31 LAB — BASIC METABOLIC PANEL
Anion Gap: 6 mmol/L (ref 5–15)
BUN/Creatinine Ratio: 13 (ref 12–20)
BUN: 8 MG/DL (ref 6–20)
CO2: 26 mmol/L (ref 21–32)
Calcium: 8.7 MG/DL (ref 8.5–10.1)
Chloride: 107 mmol/L (ref 97–108)
Creatinine: 0.64 MG/DL — ABNORMAL LOW (ref 0.70–1.30)
Est, Glom Filt Rate: >90 mL/min/{1.73_m2} (ref >60)
Glucose: 107 mg/dL — ABNORMAL HIGH (ref 65–100)
Potassium: 4.1 mmol/L (ref 3.5–5.1)
Sodium: 139 mmol/L (ref 136–145)

## 2023-07-31 MED ORDER — MELATONIN 3 MG PO TABS
3 | Freq: Every evening | ORAL | Status: DC | PRN
Start: 2023-07-31 — End: 2023-08-04
  Administered 2023-07-31 – 2023-08-01 (×2): 3 mg via ORAL

## 2023-07-31 MED FILL — ALVIMOPAN 12 MG PO CAPS: 12 MG | ORAL | Qty: 1

## 2023-07-31 MED FILL — TAMSULOSIN HCL 0.4 MG PO CAPS: 0.4 MG | ORAL | Qty: 1

## 2023-07-31 MED FILL — AMLODIPINE BESYLATE 5 MG PO TABS: 5 MG | ORAL | Qty: 1

## 2023-07-31 MED FILL — PANTOPRAZOLE SODIUM 40 MG PO TBEC: 40 MG | ORAL | Qty: 1

## 2023-07-31 MED FILL — CARVEDILOL 3.125 MG PO TABS: 3.125 MG | ORAL | Qty: 4

## 2023-07-31 MED FILL — OXYCODONE HCL 5 MG PO TABS: 5 MG | ORAL | Qty: 1

## 2023-07-31 MED FILL — ACETAMINOPHEN EXTRA STRENGTH 500 MG PO TABS: 500 MG | ORAL | Qty: 2

## 2023-07-31 MED FILL — GABAPENTIN 300 MG PO CAPS: 300 MG | ORAL | Qty: 1

## 2023-07-31 MED FILL — OXYCODONE HCL 5 MG PO TABS: 5 MG | ORAL | Qty: 2

## 2023-07-31 MED FILL — MELATONIN 3 MG PO TABS: 3 MG | ORAL | Qty: 1

## 2023-07-31 MED FILL — LISINOPRIL 20 MG PO TABS: 20 MG | ORAL | Qty: 2

## 2023-07-31 MED FILL — LANTUS 100 UNIT/ML SC SOLN: 100 UNIT/ML | SUBCUTANEOUS | Qty: 50

## 2023-07-31 NOTE — Plan of Care (Signed)
Problem: Chronic Conditions and Co-morbidities  Goal: Patient's chronic conditions and co-morbidity symptoms are monitored and maintained or improved  Outcome: Progressing     Problem: Pain  Goal: Verbalizes/displays adequate comfort level or baseline comfort level  Outcome: Progressing     Problem: Discharge Planning  Goal: Discharge to home or other facility with appropriate resources  Outcome: Progressing     Problem: Physical Therapy - Adult  Goal: By Discharge: Performs mobility at highest level of function for planned discharge setting.  See evaluation for individualized goals.  Description: FUNCTIONAL STATUS PRIOR TO ADMISSION: Patient was independent and active without use of DME.    HOME SUPPORT PRIOR TO ADMISSION: The patient lived with wife but did not require assistance.    Physical Therapy Goals  Initiated 07/29/2023  1.  Patient will move from supine to sit and sit to supine, scoot up and down, and roll side to side in bed with independence within 7 day(s).    2.  Patient will perform sit to stand with independence within 7 day(s).  3.  Patient will transfer from bed to chair and chair to bed with independence using the least restrictive device within 7 day(s).  4.  Patient will ambulate with independence for 300 feet with the least restrictive device within 7 day(s).   5.  Patient will ascend/descend 13 stairs with 1 handrail(s) with modified independence within 7 day(s).    07/31/2023 1253 by Edgardo Roys, PT  Outcome: Progressing     Problem: Safety - Adult  Goal: Free from fall injury  Outcome: Progressing     Problem: ABCDS Injury Assessment  Goal: Absence of physical injury  Outcome: Progressing

## 2023-07-31 NOTE — Op Note (Signed)
[facility]                 [address] [place], [place]  [address]                            OPERATIVE REPORT      PATIENT NAME: Evan Shepherd, Evan Shepherd          DOB: [birth year]  MED REC NO: [id number]                       ROOM: [id number]  ACCOUNT NO: [id number]                       ADMIT DATE: 07/28/2023  PROVIDER: Theresia Majors, MD    DATE OF SERVICE:  07/28/2023    PREOPERATIVE DIAGNOSES:    1. History of small bowel obstruction  2. Recurrent parastomal hernia    POSTOPERATIVE DIAGNOSES:    1. History of small bowel obstruction  2. Recurrent parastomal hernia    PROCEDURES PERFORMED:  Exploratory laparotomy, extensive lysis of adhesions, repair of recurrent paracolostomy hernia with Parietene DS mesh, and revision of ileoileostomy    SURGEON:  Theresia Majors, MD    ASSISTANT:  Jill Side, SA    ANESTHESIA:  General endotracheal and epidural    SPECIMENS REMOVED:  Ileal segment    TUBES AND DRAINS:  None    ESTIMATED BLOOD LOSS:  200 mL    CRYSTALLOID:  1300 mL    ALBUMIN:  250 mL    URINE OUTPUT:  350 mL     COMPLICATIONS:  None apparent.  The closing needle count was incorrect, but abdominal radiographs revealed no foreign body other than the penile prosthesis hardware.    IMPLANTS:  Parietene DS mesh (12 cm circular prior to cutting).    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  The most recent of these obstructive episodes occurred on 04/01/2023.  The patient also now reports that he has been appreciating what could be a recurrence of his previously repaired paracolostomy hernia.  The operation is indicated to attempt to prevent future obstructive episodes.  Lysis of adhesions and possible revision  of the ileoileal anastomosis was discussed in detail.  In addition, if indeed there was a recurrence of the parastomal hernia, then another parastomal hernia repair would also be indicated.  The patient understood the risks of these procedures and agreed to proceed.    OPERATIVE FINDINGS:  There were extensive adhesions that were mostly thin, but there were a few that were more dense and more likely to be sources of obstruction.  There was no definite point of obstruction, but there were several acute angulations.  The ileoileostomy appeared to be healthy, but it was invested with some adhesions and perhaps was of a smaller luminal diameter than it had been when it was created.  The colostomy appeared to be healthy, but there was a small paracolostomy hernia defect located superior to the bowel.  There was no evidence of infection or malignancy.    DESCRIPTION OF PROCEDURE:  After informed consent was obtained and after an epidural catheter had been placed by  the anesthesiologist, the patient was taken to the operating room.  Standard monitoring devices were attached and adequate general endotracheal anesthesia was induced.  Using standard sterile technique, a Foley catheter was placed in the bladder.  The patient was positioned supine with the lower extremities fitted with pneumatic compression stockings and all pressure points padded appropriately.  Both upper extremities were extended on arm boards.  An orogastric tube was placed.  500 mg of metronidazole and 2 g of cefazolin were administered parenterally.  The ostomy appliance was removed and residual adhesive was cleared from the skin.  The abdomen was then prepped and draped in the usual sterile fashion and a sterile ostomy appliance was put in place to contain any output and prevent contamination of the field.  The medial edge of the ostomy appliance was further secured by placement of a strip of Ioban.  The patient had undergone mechanical and antibiotic  bowel preparation at home on the day before the procedure.    The abdomen was entered through a midline incision that was made with a scalpel through the existing scar.  The incision was deepened using electrocautery and then the scalpel once again.  The midline fascia and scar were opened and careful dissection was performed to divide adhesions to the anterior abdominal wall.  Safe entry into the peritoneal cavity was thus achieved.  A significant portion of the procedure was then spent exploring the abdomen and dividing the adhesions.  The division of the adhesions was performed using a combination of electrocautery and sharp dissection with scissors.  The entire small intestine with the exception of the duodenum was freed, and inspection revealed no injuries.  The ileoileostomy was carefully examined and it was decided that revising it to increase the luminal diameter could be beneficial.  Before performing that part of the procedure, however, it was decided to repair the recurrent parastomal hernia.  This hernia was much smaller than the one that had been previously repaired.  The previously placed Parietene DS mesh was still present, but there was just one gap superior to the bowel where it passed through the abdominal wall.  There was some fat contained within the hernia and it was reduced.  The hernia defect was then closed primarily with a #1 PDS suture.    A 12 cm circular piece of Parietene DS mesh was brought onto the field.  It was oriented properly so that the non-adherent side would be exposed in the peritoneal cavity.  The mesh was then used in an onlay fashion (modified Sugarbaker technique) to cover the primarily repaired defect.  It was secured by placement of approximately 16 Secure Strap Open tacks as well as some #1 PDS sutures and 3-0 Vicryl sutures.  The colostomy was directly visualized throughout the case through the transparent ostomy appliance, and it did not appear to have been  compromised or altered.  The pressure exerted by the mesh on the colon was assessed and it was felt to be insufficient to cause an obstruction.  The colon was then packed away to separate it from the small bowel and the ileoileostomy was reidentified.    In order to enlarge the size of the lumen of this side-to-side (functional end-to-end) enteroenterostomy, an enterotomy was made on the antimesenteric portion of the bowel using the electrocautery.  Retained bowel contents were evacuated with suction and the GIA 75 stapling device with a blue cartridge was introduced longitudinally into the two lumens.  The two portions of the  ileum were adjacent to one another.  The stapler was closed with care taken to avoid incorporating the mesentery or other extraneous tissues.  The stapler was then fired, opened, and removed.  Inspection of the staple lines within the bowel lumen revealed good hemostasis.  The enterotomy that had been created to permit insertion of the stapler was then closed using a transverse firing of the TX60B stapling device.  The small piece of ileum distal to the TX staple line was excised using a scalpel and passed off the table as a specimen.  The anastomosis was then selectively reinforced with multiple seromuscular 3-0 silk sutures.  The ends of the TX staple line were imbricated.  The crotch of the anastomosis was reinforced.  Upon completion, the revised anastomosis was apparently well vascularized and the luminal diameter was at least twice the size of what it had been.  There was no rent in the mesentery.    The bowel was returned to the abdominal cavity.  The cavity was irrigated and the irrigant was mostly evacuated.  Five small sheets of Seprafilm were placed on the viscera with none directly on the ileoileostomy.  The midline fascial closure was then performed using tw running #1 PDS sutures.  The subcutaneous portion of the midline wound was irrigated with saline and the subcutaneous  adipose tissue was reapproximated using 2-0 Vicryl sutures.  Skin closure was then performed using running subcuticular 4-0 Monocryl sutures and Dermabond.  The ostomy appliance was replaced with a new clean one.  The Foley catheter was left in the bladder, and the orogastric tube was removed.    There were no apparent complications but, as noted above, there was an incorrect needle count.  Abdominal radiographs were therefore obtained and, after I had reviewed them myself and after we received confirmation from the radiologist that there was no retained foreign body, the patient was extubated and transported in stable condition to the recovery room.        Theresia Majors, MD      PAG/AQS  D:  07/31/2023 22:49:02  T:  07/31/2023 23:18:02  JOB #:  [id number]    CC:   Theresia Majors, MD

## 2023-07-31 NOTE — Progress Notes (Signed)
General Daily Progress Note    Admission Date: 07/28/2023  Hospital Day 4  Post-Op Day 3  Subjective:   Tolerating small amounts of food.  Little output form the colostomy so far.  Voiding well.  Pain control adequate.      Objective:   Patient Vitals for the past 24 hrs:   BP Temp Temp src Pulse Resp SpO2 Weight   07/31/23 1802 (!) 155/78 -- -- 83 -- -- --   07/31/23 0825 123/64 97.7 F (36.5 C) Oral 64 18 94 % --   07/31/23 0656 115/69 -- -- 66 -- -- 97.3 kg (214 lb 9.6 oz)   07/30/23 2207 -- -- -- -- 18 -- --   07/30/23 2137 -- -- -- -- 18 -- --   07/30/23 1922 135/70 98.2 F (36.8 C) Oral 71 18 93 % --     08/02 0701 - 08/02 1900  In: -   Out: 25   07/31 1901 - 08/02 0700  In: -   Out: 1750 [Urine:1750]    Physical Examination:  General Appearance - No distress.  Abdomen - Soft.  Mildly distended.  Appropriately tender.  Incision clean, dry, and intact; no significant erythema.  Colostomy viable; some gas in the appliance.  Extremities - Pneumatic compression stockings in use.  No calf tenderness.                 Data Review   Recent Results (from the past 24 hour(s))   POCT Glucose    Collection Time: 07/30/23  9:41 PM   Result Value Ref Range    POC Glucose 205 (H) 65 - 117 mg/dL    Performed by: Sarita Haver    CBC with Auto Differential    Collection Time: 07/31/23  5:37 AM   Result Value Ref Range    WBC 6.8 4.1 - 11.1 K/uL    RBC 5.30 4.10 - 5.70 M/uL    Hemoglobin 12.8 12.1 - 17.0 g/dL    Hematocrit 30.8 65.7 - 50.3 %    MCV 78.3 (L) 80.0 - 99.0 FL    MCH 24.2 (L) 26.0 - 34.0 PG    MCHC 30.8 30.0 - 36.5 g/dL    RDW 84.6 (H) 96.2 - 14.5 %    Platelets 183 150 - 400 K/uL    MPV 10.1 8.9 - 12.9 FL    Nucleated RBCs 0.0 0 PER 100 WBC    nRBC 0.00 0.00 - 0.01 K/uL    Neutrophils % 59 32 - 75 %    Lymphocytes % 29 12 - 49 %    Monocytes % 8 5 - 13 %    Eosinophils % 4 0 - 7 %    Basophils % 0 0 - 1 %    Immature Granulocytes % 0 0.0 - 0.5 %    Neutrophils Absolute 4.0 1.8 - 8.0 K/UL    Lymphocytes Absolute  1.9 0.8 - 3.5 K/UL    Monocytes Absolute 0.5 0.0 - 1.0 K/UL    Eosinophils Absolute 0.3 0.0 - 0.4 K/UL    Basophils Absolute 0.0 0.0 - 0.1 K/UL    Immature Granulocytes Absolute 0.0 0.00 - 0.04 K/UL    Differential Type AUTOMATED     Basic Metabolic Panel    Collection Time: 07/31/23  5:37 AM   Result Value Ref Range    Sodium 139 136 - 145 mmol/L    Potassium 4.1 3.5 - 5.1 mmol/L    Chloride 107  97 - 108 mmol/L    CO2 26 21 - 32 mmol/L    Anion Gap 6 5 - 15 mmol/L    Glucose 107 (H) 65 - 100 mg/dL    BUN 8 6 - 20 MG/DL    Creatinine 0.98 (L) 0.70 - 1.30 MG/DL    BUN/Creatinine Ratio 13 12 - 20      Est, Glom Filt Rate >90 >60 ml/min/1.12m2    Calcium 8.7 8.5 - 10.1 MG/DL   POCT Glucose    Collection Time: 07/31/23  6:04 AM   Result Value Ref Range    POC Glucose 101 65 - 117 mg/dL    Performed by: Mohammed Kindle    POCT Glucose    Collection Time: 07/31/23 11:32 AM   Result Value Ref Range    POC Glucose 172 (H) 65 - 117 mg/dL    Performed by: Odella Aquas    POCT Glucose    Collection Time: 07/31/23  5:42 PM   Result Value Ref Range    POC Glucose 149 (H) 65 - 117 mg/dL    Performed by: Odella Aquas            Assessment:     Principal Problem:    Partial small bowel obstruction (HCC)  Resolved Problems:    * No resolved hospital problems. *      3 Days Post-Op s/p  exploratory laparotomy, extensive lysis of adhesions, repair of recurrent paracolostomy hernia with Parietene DS mesh, and revision of ileoileostomy.     Hemodynamically stable.  Hemoglobin stable.  Good urine output.  Creatinine within normal limits.  GI function is slowly returning.       Plan:   Continue low fiber diet begun on 07/30/2023.  Continue nutritional supplements.  Ambulate.  Physical therapy.  Incentive spirometer.    Possible discharge tomorrow if there has been a greater return of GI function.

## 2023-07-31 NOTE — Plan of Care (Signed)
Problem: Physical Therapy - Adult  Goal: By Discharge: Performs mobility at highest level of function for planned discharge setting.  See evaluation for individualized goals.  Description: FUNCTIONAL STATUS PRIOR TO ADMISSION: Patient was independent and active without use of DME.    HOME SUPPORT PRIOR TO ADMISSION: The patient lived with wife but did not require assistance.    Physical Therapy Goals  Initiated 07/29/2023  1.  Patient will move from supine to sit and sit to supine, scoot up and down, and roll side to side in bed with independence within 7 day(s).    2.  Patient will perform sit to stand with independence within 7 day(s).  3.  Patient will transfer from bed to chair and chair to bed with independence using the least restrictive device within 7 day(s).  4.  Patient will ambulate with independence for 300 feet with the least restrictive device within 7 day(s).   5.  Patient will ascend/descend 13 stairs with 1 handrail(s) with modified independence within 7 day(s).    Outcome: Progressing     PHYSICAL THERAPY  DISCHARGE    Patient: Evan Shepherd (67 y.o. 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 Pt moving overall slightly slower however stable without LOB and greatly improved anxiety. Discussed progression and expectations. Will DC as skilled therapy no longer needed.      Further skilled acute physical therapy is not indicated at this time.       PLAN  :  Recommendation for discharge: (in order for the patient to meet his/her long term goals): No skilled physical therapy    Other factors to consider for discharge: new weight bearing restrictions limiting activity or patient is unable to maintain    IF patient discharges home will need the following DME: none       SUBJECTIVE:   Patient stated "I am very good today."    OBJECTIVE DATA SUMMARY:     Past Medical History:   Diagnosis Date    Arthritis     Dyslipidemia     Erectile dysfunction     Fecal incontinence     GERD (gastroesophageal reflux disease)     History of colonic polyps     History of rectal cancer 2006    Treated with neoadjuvant chemoradiation followed by surgery and then, presumably, adjuvant chemotherapy. The patient appears to have been cured.    History of small bowel obstruction     Multiple episodes treated non-operatively    Hypertension     S/P ablation of ventricular arrhythmia 09/26/2022    Type II diabetes mellitus [facility])      Past Surgical History:   Procedure Laterality Date    CARDIAC ELECTROPHYSIOLOGY MAPPING AND ABLATION  09/26/2022    [facility], [place], [place].    COLONOSCOPY  2006    COLONOSCOPY  11/28/2020    Tressia Danas, M.D. (in [place]) ? Four polypoid lesions  were excised. Two proved to have been tubular adenomas and two were lymphoid aggregates. A five-year follow-up interval was recommended.    COLOSTOMY      HEENT Bilateral 2018    CATARACT SURGERY    HEMICOLECTOMY N/A 10/16/2022    Open lysis of adhesions, excision of coloanal J-pouch, colostomy revision, and repair of parastomal hernia with Parietene DS mesh; Dr. Nanda Quinton.    HERNIA REPAIR N/A 07/28/2023    EXPLORATORY LAPAROTOMY, EXTENSIVE LYSIS OF ADHESIONS, REPAIR OF RECURENT PARAHERNIA WITH MESH AND REVISION OF ILEOSTOMY (E.R.A.S.) (ANES GENERAL/EPIDURAL) performed by Theresia Majors, MD at [facility] MAIN OR    IR PORT PLACEMENT EQUAL OR GREATER THAN 5 YEARS  2006    OTHER SURGICAL HISTORY  12/03/2021     Hand-assisted laparoscopic creation of divided loop colostomy; Dr. Nanda Quinton.    OTHER SURGICAL HISTORY  10/29/2005    Intersphincteric proctectomy with colonic J-pouch to anal anastomosis and diverting loop ileostomy Aleatha Borer, M.D., [facility])    OTHER SURGICAL HISTORY  06/22/2006    Closure of loop ileostomy and removal of subcutaneous infusion port Aleatha Borer, M.D., [facility])    OTHER SURGICAL HISTORY  11/01/2018    ?Hemorrhoidectomy? Rosalyn Charters, M.D. in [place]) ? This operation was really the excision of prolapsing colonic J pouch mucosa using the Ligasure and closure of the defects with 3-0 Vicryl sutures.    OTHER SURGICAL HISTORY  04/08/2022    Proctoplasty; Dr. Nanda Quinton    PR UNLISTED PROCEDURE ABDOMEN PERITONEUM & OMENTUM  2008    HERNIA REPAIR    UPPER GASTROINTESTINAL ENDOSCOPY  04/18/2020    EGD; Tressia Danas, MD.    URETER SURGERY Bilateral 10/16/2022    CYSTOSCOPY WITH INSERTION BILATERAL URETERAL CATHETERS performed by Eschenroeder, Doy Hutching, MD at [facility] MAIN OR    UROLOGICAL SURGERY  12/03/2021    Cystoscopy and placement of bilateral ureteral catheters; Dennard Nip, MD.    UROLOGICAL SURGERY  10/29/2009    Insertion of Coloplast three-piece inflatable penile prosthesis (Mark C. Vernie Ammons, M.D.)    WISDOM TOOTH EXTRACTION         Home Situation and Prior Level of Function:   Social/Functional History  Lives With: Spouse  Type of Home: House  Home Layout: Two level, Able to Live on Main level with bedroom/bathroom  Receives Help From: Family  ADL Assistance: Independent  Homemaking Assistance: Independent  Homemaking Responsibilities: No  Ambulation Assistance: Independent  Transfer Assistance: Surveyor, minerals: Yes  Mode of Transportation: Customer service manager Behavior:     Functional Mobility:  Bed Mobility:     [employer]: Yes  Overall Level of Assistance: Independent  Rolling: Independent  Supine to Sit:  Independent  Transfers:     Art therapist: Yes  Overall Level of Assistance: Independent  Sit to Stand: Independent  Stand to Sit: Independent  Balance:               Balance  Sitting: Intact  Standing: Intact;Without support  Standing - Static: Good  Standing - Dynamic: Good  Ambulation/Gait Training:                       Gait  Gait Training: Yes  Overall Level of Assistance: Independent  Distance (ft): 340 Feet  Base of Support: Widened  Speed/Cadence: Slow;Shuffled  Step Length: Left shortened;Right shortened  Pain Rating:        Activity Tolerance:   Good    After treatment:   Patient left in no apparent distress sitting up in chair      COMMUNICATION/EDUCATION:   The patient's plan of care was discussed with: registered nurse and case manager         Thank you for this referral.  Edgardo Roys, PT  Minutes: 12

## 2023-07-31 NOTE — Progress Notes (Signed)
Patient strongly requested to have only 35 units of Lantus. Administered 35 units as per requested.

## 2023-08-01 LAB — POCT GLUCOSE
POC Glucose: 184 mg/dL — ABNORMAL HIGH (ref 65–117)
POC Glucose: 191 mg/dL — ABNORMAL HIGH (ref 65–117)
POC Glucose: 154 mg/dL — ABNORMAL HIGH (ref 65–117)
POC Glucose: 158 mg/dL — ABNORMAL HIGH (ref 65–117)
POC Glucose: 121 mg/dL — ABNORMAL HIGH (ref 65–117)

## 2023-08-01 LAB — CBC WITH AUTO DIFFERENTIAL
Basophils %: 1 % (ref 0–1)
Basophils Absolute: 0 10*3/uL (ref 0.0–0.1)
Eosinophils %: 5 % (ref 0–7)
Eosinophils Absolute: 0.3 10*3/uL (ref 0.0–0.4)
Hematocrit: 41.9 % (ref 36.6–50.3)
Hemoglobin: 13.2 g/dL (ref 12.1–17.0)
Immature Granulocytes %: 0 % (ref 0.0–0.5)
Immature Granulocytes Absolute: 0 10*3/uL (ref 0.00–0.04)
Lymphocytes %: 23 % (ref 12–49)
Lymphocytes Absolute: 1.7 10*3/uL (ref 0.8–3.5)
MCH: 24.3 PG — ABNORMAL LOW (ref 26.0–34.0)
MCHC: 31.5 g/dL (ref 30.0–36.5)
MCV: 77.2 FL — ABNORMAL LOW (ref 80.0–99.0)
MPV: 10.6 FL (ref 8.9–12.9)
Monocytes %: 7 % (ref 5–13)
Monocytes Absolute: 0.5 10*3/uL (ref 0.0–1.0)
Neutrophils %: 64 % (ref 32–75)
Neutrophils Absolute: 5 10*3/uL (ref 1.8–8.0)
Nucleated RBCs: 0 PER 100 WBC
Platelets: 227 10*3/uL (ref 150–400)
RBC: 5.43 M/uL (ref 4.10–5.70)
RDW: 16.9 % — ABNORMAL HIGH (ref 11.5–14.5)
WBC: 7.6 10*3/uL (ref 4.1–11.1)
nRBC: 0 10*3/uL (ref 0.00–0.01)

## 2023-08-01 MED FILL — OXYCODONE HCL 5 MG PO TABS: 5 MG | ORAL | Qty: 2

## 2023-08-01 MED FILL — LISINOPRIL 20 MG PO TABS: 20 MG | ORAL | Qty: 2

## 2023-08-01 MED FILL — GABAPENTIN 300 MG PO CAPS: 300 MG | ORAL | Qty: 1

## 2023-08-01 MED FILL — CARVEDILOL 3.125 MG PO TABS: 3.125 MG | ORAL | Qty: 4

## 2023-08-01 MED FILL — MELATONIN 3 MG PO TABS: 3 MG | ORAL | Qty: 1

## 2023-08-01 MED FILL — ONDANSETRON HCL 4 MG/2ML IJ SOLN: 4 MG/2ML | INTRAMUSCULAR | Qty: 2

## 2023-08-01 MED FILL — AMLODIPINE BESYLATE 5 MG PO TABS: 5 MG | ORAL | Qty: 1

## 2023-08-01 MED FILL — ACETAMINOPHEN EXTRA STRENGTH 500 MG PO TABS: 500 MG | ORAL | Qty: 2

## 2023-08-01 MED FILL — PANTOPRAZOLE SODIUM 40 MG PO TBEC: 40 MG | ORAL | Qty: 1

## 2023-08-01 MED FILL — TAMSULOSIN HCL 0.4 MG PO CAPS: 0.4 MG | ORAL | Qty: 1

## 2023-08-01 MED FILL — ALVIMOPAN 12 MG PO CAPS: 12 MG | ORAL | Qty: 1

## 2023-08-01 MED FILL — ACETAMINOPHEN EXTRA STRENGTH 500 MG PO TABS: 500 MG | ORAL | Qty: 1

## 2023-08-01 MED FILL — LANTUS 100 UNIT/ML SC SOLN: 100 UNIT/ML | SUBCUTANEOUS | Qty: 35

## 2023-08-01 NOTE — Progress Notes (Signed)
Signed         Bedside shift change report given to Karla,RN (oncoming nurse) by [employer], RN (offgoing nurse). Report included the following information Nurse Handoff Report, Index, ED Encounter Summary, ED SBAR, Adult Overview, Surgery Report, Intake/Output, MAR, Recent Results, Med Rec Status, Alarm Parameters, Procedure Verification, Quality Measures, and Neuro Assessment.

## 2023-08-01 NOTE — Progress Notes (Signed)
General Daily Progress Note    Admission Date: 07/28/2023  Hospital Day 5  Post-Op Day 4  Subjective:   Mild nausea last night.  Passing flatus but only scant stool.  Eating solid food in small quantities.  Ambulating well.  Voiding well.  Pain control adequate.      Objective:   Patient Vitals for the past 24 hrs:   BP Temp Temp src Pulse Resp SpO2 Weight   08/01/23 0944 112/71 -- -- 66 -- -- --   08/01/23 0810 (!) 112/57 97.7 F (36.5 C) Oral 65 18 94 % --   08/01/23 0709 -- -- -- -- -- -- 105.1 kg (231 lb 12.8 oz)   08/01/23 0344 127/70 99 F (37.2 C) Oral 73 15 95 % --   08/01/23 0315 -- 98.4 F (36.9 C) Oral -- -- -- --   07/31/23 1956 (!) 147/74 98.4 F (36.9 C) Oral 70 16 96 % --   07/31/23 1802 (!) 155/78 -- -- 83 -- -- --     No intake/output data recorded.  08/01 1901 - 08/03 0700  In: -   Out: 25     Physical Examination:  General Appearance - No distress.  Abdomen - Soft.  Mildly distended.  Appropriately tender.  Incision clean, dry, and intact; no erythema.  Colostomy viable; some gas in the appliance but no new stool.  Extremities - Pneumatic compression stockings in use.          Data Review   Recent Results (from the past 24 hour(s))   POCT Glucose    Collection Time: 07/31/23 11:32 AM   Result Value Ref Range    POC Glucose 172 (H) 65 - 117 mg/dL    Performed by: Odella Aquas    POCT Glucose    Collection Time: 07/31/23  4:48 PM   Result Value Ref Range    POC Glucose 184 (H) 65 - 117 mg/dL    Performed by: Odella Aquas    POCT Glucose    Collection Time: 07/31/23  5:42 PM   Result Value Ref Range    POC Glucose 149 (H) 65 - 117 mg/dL    Performed by: Odella Aquas    POCT Glucose    Collection Time: 07/31/23  9:28 PM   Result Value Ref Range    POC Glucose 191 (H) 65 - 117 mg/dL    Performed by: PATEL Noopur    POCT Glucose    Collection Time: 08/01/23  6:34 AM   Result Value Ref Range    POC Glucose 154 (H) 65 - 117 mg/dL    Performed by: Olena Leatherwood             Assessment:     Principal Problem:    Partial small bowel obstruction (HCC)  Resolved Problems:    * No resolved hospital problems. *      4 Days Post-Op s/p  exploratory laparotomy, extensive lysis of adhesions, repair of recurrent paracolostomy hernia with Parietene DS mesh, and revision of ileoileostomy.     Hemodynamically stable.  Hemoglobin stable.  No leukocytosis.    Awaiting return of GI function.    Plan:   Continue low fiber diet and nutritional supplements.  Ambulate.  Physical therapy.  Incentive spirometer.     Will discharge once there has been sufficient return of GI function.

## 2023-08-01 NOTE — Progress Notes (Signed)
Patient requested to have only 40 units of Lantus. Administered 40 units as per requested.

## 2023-08-02 LAB — POCT GLUCOSE
POC Glucose: 104 mg/dL (ref 65–117)
POC Glucose: 116 mg/dL (ref 65–117)
POC Glucose: 162 mg/dL — ABNORMAL HIGH (ref 65–117)
POC Glucose: 113 mg/dL (ref 65–117)

## 2023-08-02 LAB — CBC
Hematocrit: 40 % (ref 36.6–50.3)
Hemoglobin: 13.2 g/dL (ref 12.1–17.0)
MCH: 25.1 PG — ABNORMAL LOW (ref 26.0–34.0)
MCHC: 33 g/dL (ref 30.0–36.5)
MCV: 76.2 FL — ABNORMAL LOW (ref 80.0–99.0)
MPV: 10.1 FL (ref 8.9–12.9)
Nucleated RBCs: 0 PER 100 WBC
Platelets: 234 10*3/uL (ref 150–400)
RBC: 5.25 M/uL (ref 4.10–5.70)
RDW: 16.9 % — ABNORMAL HIGH (ref 11.5–14.5)
WBC: 7.2 10*3/uL (ref 4.1–11.1)
nRBC: 0 10*3/uL (ref 0.00–0.01)

## 2023-08-02 LAB — BASIC METABOLIC PANEL
Anion Gap: 7 mmol/L (ref 5–15)
BUN/Creatinine Ratio: 16 (ref 12–20)
BUN: 12 MG/DL (ref 6–20)
CO2: 25 mmol/L (ref 21–32)
Calcium: 8.9 MG/DL (ref 8.5–10.1)
Chloride: 103 mmol/L (ref 97–108)
Creatinine: 0.76 MG/DL (ref 0.70–1.30)
Est, Glom Filt Rate: >90 mL/min/{1.73_m2} (ref >60)
Glucose: 133 mg/dL — ABNORMAL HIGH (ref 65–100)
Potassium: 4.6 mmol/L (ref 3.5–5.1)
Sodium: 135 mmol/L — ABNORMAL LOW (ref 136–145)

## 2023-08-02 MED FILL — TAMSULOSIN HCL 0.4 MG PO CAPS: 0.4 MG | ORAL | Qty: 1

## 2023-08-02 MED FILL — OXYCODONE HCL 5 MG PO TABS: 5 MG | ORAL | Qty: 2

## 2023-08-02 MED FILL — LANTUS 100 UNIT/ML SC SOLN: 100 UNIT/ML | SUBCUTANEOUS | Qty: 40

## 2023-08-02 MED FILL — ACETAMINOPHEN EXTRA STRENGTH 500 MG PO TABS: 500 MG | ORAL | Qty: 2

## 2023-08-02 MED FILL — CARVEDILOL 3.125 MG PO TABS: 3.125 MG | ORAL | Qty: 4

## 2023-08-02 MED FILL — GABAPENTIN 300 MG PO CAPS: 300 MG | ORAL | Qty: 1

## 2023-08-02 MED FILL — PANTOPRAZOLE SODIUM 40 MG PO TBEC: 40 MG | ORAL | Qty: 1

## 2023-08-02 MED FILL — AMLODIPINE BESYLATE 5 MG PO TABS: 5 MG | ORAL | Qty: 1

## 2023-08-02 NOTE — Progress Notes (Signed)
General Daily Progress Note    Admission Date: 07/28/2023  Hospital Day 6  Post-Op Day 5  Subjective:   Tried taking 5 mg of oxycodone instead of 10 mg but he found that he really needed the higher dose.  Scant passage of gas via the colostomy.  Still no bowel movement.  Mild nausea earlier has now resolved.          Objective:   Patient Vitals for the past 24 hrs:   BP Temp Temp src Pulse Resp SpO2 Weight   08/02/23 0802 106/64 97.9 F (36.6 C) Oral 73 18 96 % --   08/02/23 0645 -- -- -- -- -- -- 104.8 kg (231 lb 0.7 oz)   08/01/23 1947 123/63 97.7 F (36.5 C) -- 68 18 96 % --   08/01/23 1915 131/72 -- -- 71 -- -- --   08/01/23 1815 111/64 -- -- 68 -- 93 % --   08/01/23 1230 115/68 -- -- 64 -- -- --     No intake/output data recorded.  No intake/output data recorded.    Physical Examination:  General Appearance - No distress.  Abdomen - Soft.  Mildly distended.  Appropriately tender.  Incision clean, dry, and intact; no erythema.  Colostomy viable; scant gas in the appliance but no new stool.  Extremities - Pneumatic compression stockings in use.               Data Review   Recent Results (from the past 24 hour(s))   POCT Glucose    Collection Time: 08/01/23 11:52 AM   Result Value Ref Range    POC Glucose 158 (H) 65 - 117 mg/dL    Performed by: Odella Aquas    POCT Glucose    Collection Time: 08/01/23  5:28 PM   Result Value Ref Range    POC Glucose 121 (H) 65 - 117 mg/dL    Performed by: Odella Aquas    POCT Glucose    Collection Time: 08/01/23  8:54 PM   Result Value Ref Range    POC Glucose 104 65 - 117 mg/dL    Performed by: PATEL Noopur    CBC    Collection Time: 08/02/23  6:04 AM   Result Value Ref Range    WBC 7.2 4.1 - 11.1 K/uL    RBC 5.25 4.10 - 5.70 M/uL    Hemoglobin 13.2 12.1 - 17.0 g/dL    Hematocrit 04.5 40.9 - 50.3 %    MCV 76.2 (L) 80.0 - 99.0 FL    MCH 25.1 (L) 26.0 - 34.0 PG    MCHC 33.0 30.0 - 36.5 g/dL    RDW 81.1 (H) 91.4 - 14.5 %    Platelets 234 150 - 400 K/uL    MPV 10.1  8.9 - 12.9 FL    Nucleated RBCs 0.0 0 PER 100 WBC    nRBC 0.00 0.00 - 0.01 K/uL   Basic Metabolic Panel    Collection Time: 08/02/23  6:04 AM   Result Value Ref Range    Sodium 135 (L) 136 - 145 mmol/L    Potassium 4.6 3.5 - 5.1 mmol/L    Chloride 103 97 - 108 mmol/L    CO2 25 21 - 32 mmol/L    Anion Gap 7 5 - 15 mmol/L    Glucose 133 (H) 65 - 100 mg/dL    BUN 12 6 - 20 MG/DL    Creatinine 7.82 9.56 - 1.30 MG/DL  BUN/Creatinine Ratio 16 12 - 20      Est, Glom Filt Rate >90 >60 ml/min/1.82m2    Calcium 8.9 8.5 - 10.1 MG/DL   POCT Glucose    Collection Time: 08/02/23  6:39 AM   Result Value Ref Range    POC Glucose 116 65 - 117 mg/dL    Performed by: PATEL Noopur            Assessment:     Principal Problem:    Partial small bowel obstruction (HCC)  Active Problems:    Postoperative ileus (HCC)  Resolved Problems:    * No resolved hospital problems. *      5 Days Post-Op s/p  exploratory laparotomy, extensive lysis of adhesions, repair of recurrent paracolostomy hernia with Parietene DS mesh, and revision of ileoileostomy.     Hemodynamically stable.  Hemoglobin stable.  No leukocytosis.  Post-op ileus.   Awaiting return of GI function.      Plan:   Continue low fiber diet and nutritional supplements.  Ambulate.  Physical therapy.  Incentive spirometer.     Will discharge once there has been sufficient return of GI function.

## 2023-08-02 NOTE — Progress Notes (Signed)
Bedside shift change report given to Paula Compton, Charity fundraiser (Cabin crew) by [employer], Museum/gallery conservator). Report included the following information Nurse Handoff Report, Index, ED Encounter Summary, ED SBAR, Adult Overview, Surgery Report, Intake/Output, MAR, Recent Results, Med Rec Status, Alarm Parameters, Procedure Verification, Quality Measures, and Neuro Assessment.

## 2023-08-03 LAB — BASIC METABOLIC PANEL
Anion Gap: 6 mmol/L (ref 5–15)
BUN/Creatinine Ratio: 12 (ref 12–20)
BUN: 10 MG/DL (ref 6–20)
CO2: 26 mmol/L (ref 21–32)
Calcium: 8.8 MG/DL (ref 8.5–10.1)
Chloride: 103 mmol/L (ref 97–108)
Creatinine: 0.82 MG/DL (ref 0.70–1.30)
Est, Glom Filt Rate: >90 mL/min/{1.73_m2} (ref >60)
Glucose: 112 mg/dL — ABNORMAL HIGH (ref 65–100)
Potassium: 4.1 mmol/L (ref 3.5–5.1)
Sodium: 135 mmol/L — ABNORMAL LOW (ref 136–145)

## 2023-08-03 LAB — POCT GLUCOSE
POC Glucose: 181 mg/dL — ABNORMAL HIGH (ref 65–117)
POC Glucose: 104 mg/dL (ref 65–117)
POC Glucose: 99 mg/dL (ref 65–117)
POC Glucose: 186 mg/dL — ABNORMAL HIGH (ref 65–117)

## 2023-08-03 LAB — PHOSPHORUS: Phosphorus: 4.1 MG/DL (ref 2.6–4.7)

## 2023-08-03 LAB — CBC WITH AUTO DIFFERENTIAL
Basophils %: 1 % (ref 0–1)
Basophils Absolute: 0.1 10*3/uL (ref 0.0–0.1)
Eosinophils %: 5 % (ref 0–7)
Eosinophils Absolute: 0.4 10*3/uL (ref 0.0–0.4)
Hematocrit: 40.7 % (ref 36.6–50.3)
Hemoglobin: 13.1 g/dL (ref 12.1–17.0)
Immature Granulocytes %: 0 % (ref 0.0–0.5)
Immature Granulocytes Absolute: 0 10*3/uL (ref 0.00–0.04)
Lymphocytes %: 24 % (ref 12–49)
Lymphocytes Absolute: 1.9 10*3/uL (ref 0.8–3.5)
MCH: 24.8 PG — ABNORMAL LOW (ref 26.0–34.0)
MCHC: 32.2 g/dL (ref 30.0–36.5)
MCV: 76.9 FL — ABNORMAL LOW (ref 80.0–99.0)
MPV: 10 FL (ref 8.9–12.9)
Monocytes %: 7 % (ref 5–13)
Monocytes Absolute: 0.5 10*3/uL (ref 0.0–1.0)
Neutrophils %: 63 % (ref 32–75)
Neutrophils Absolute: 4.9 10*3/uL (ref 1.8–8.0)
Nucleated RBCs: 0 PER 100 WBC
Platelets: 248 10*3/uL (ref 150–400)
RBC: 5.29 M/uL (ref 4.10–5.70)
RDW: 16.6 % — ABNORMAL HIGH (ref 11.5–14.5)
WBC: 7.7 10*3/uL (ref 4.1–11.1)
nRBC: 0 10*3/uL (ref 0.00–0.01)

## 2023-08-03 LAB — MAGNESIUM: Magnesium: 1.7 mg/dL (ref 1.6–2.4)

## 2023-08-03 MED ORDER — POLYETHYLENE GLYCOL 3350 17 G PO PACK
17 | Freq: Two times a day (BID) | ORAL | Status: DC
Start: 2023-08-03 — End: 2023-08-04
  Administered 2023-08-03 – 2023-08-04 (×3): 17 g via ORAL

## 2023-08-03 MED FILL — CARVEDILOL 3.125 MG PO TABS: 3.125 MG | ORAL | Qty: 4

## 2023-08-03 MED FILL — ACETAMINOPHEN EXTRA STRENGTH 500 MG PO TABS: 500 MG | ORAL | Qty: 2

## 2023-08-03 MED FILL — LISINOPRIL 20 MG PO TABS: 20 MG | ORAL | Qty: 2

## 2023-08-03 MED FILL — OXYCODONE HCL 5 MG PO TABS: 5 MG | ORAL | Qty: 2

## 2023-08-03 MED FILL — LANTUS 100 UNIT/ML SC SOLN: 100 UNIT/ML | SUBCUTANEOUS | Qty: 45

## 2023-08-03 MED FILL — ONDANSETRON HCL 4 MG/2ML IJ SOLN: 4 MG/2ML | INTRAMUSCULAR | Qty: 2

## 2023-08-03 MED FILL — PANTOPRAZOLE SODIUM 40 MG PO TBEC: 40 MG | ORAL | Qty: 1

## 2023-08-03 MED FILL — GABAPENTIN 300 MG PO CAPS: 300 MG | ORAL | Qty: 1

## 2023-08-03 MED FILL — TAMSULOSIN HCL 0.4 MG PO CAPS: 0.4 MG | ORAL | Qty: 1

## 2023-08-03 MED FILL — AMLODIPINE BESYLATE 5 MG PO TABS: 5 MG | ORAL | Qty: 1

## 2023-08-03 MED FILL — MIRALAX 17 G PO PACK: 17 g | ORAL | Qty: 1

## 2023-08-03 MED FILL — OXYCODONE HCL 5 MG PO TABS: 5 MG | ORAL | Qty: 1

## 2023-08-03 NOTE — Progress Notes (Signed)
General Daily Progress Note    Admission Date: 07/28/2023  Hospital Day 7  Post-Op Day 6  Subjective:   Mild nausea last night.  Zofran resolved it.  Very small amount of colostomy output this afternoon.      Objective:   Patient Vitals for the past 24 hrs:   BP Temp Temp src Pulse Resp SpO2   08/03/23 0838 120/73 97.9 F (36.6 C) Oral 72 18 91 %   08/02/23 2004 (!) 141/76 98.6 F (37 C) Oral 76 18 93 %   08/02/23 1921 -- -- -- 94 -- --   08/02/23 1919 122/70 -- -- -- -- --     No intake/output data recorded.  08/03 1901 - 08/05 0700  In: 900 [P.O.:900]  Out: 300 [Urine:300]    Physical Examination:  General Appearance - No distress.  Abdomen - Soft.  Less distended.  Appropriately tender.  Incision clean, dry, and intact; no erythema.  Colostomy viable; scant fluid in the appliance.  Extremities - Pneumatic compression stockings in use.            Data Review   Recent Results (from the past 24 hour(s))   POCT Glucose    Collection Time: 08/02/23  4:18 PM   Result Value Ref Range    POC Glucose 113 65 - 117 mg/dL    Performed by: Odella Aquas    POCT Glucose    Collection Time: 08/02/23  9:45 PM   Result Value Ref Range    POC Glucose 181 (H) 65 - 117 mg/dL    Performed by: Mohammed Kindle    POCT Glucose    Collection Time: 08/03/23  6:34 AM   Result Value Ref Range    POC Glucose 104 65 - 117 mg/dL    Performed by: Mohammed Kindle    CBC with Auto Differential    Collection Time: 08/03/23 10:05 AM   Result Value Ref Range    WBC 7.7 4.1 - 11.1 K/uL    RBC 5.29 4.10 - 5.70 M/uL    Hemoglobin 13.1 12.1 - 17.0 g/dL    Hematocrit 16.1 09.6 - 50.3 %    MCV 76.9 (L) 80.0 - 99.0 FL    MCH 24.8 (L) 26.0 - 34.0 PG    MCHC 32.2 30.0 - 36.5 g/dL    RDW 04.5 (H) 40.9 - 14.5 %    Platelets 248 150 - 400 K/uL    MPV 10.0 8.9 - 12.9 FL    Nucleated RBCs 0.0 0 PER 100 WBC    nRBC 0.00 0.00 - 0.01 K/uL    Neutrophils % 63 32 - 75 %    Lymphocytes % 24 12 - 49 %    Monocytes % 7 5 - 13 %    Eosinophils % 5 0 - 7 %    Basophils  % 1 0 - 1 %    Immature Granulocytes % 0 0.0 - 0.5 %    Neutrophils Absolute 4.9 1.8 - 8.0 K/UL    Lymphocytes Absolute 1.9 0.8 - 3.5 K/UL    Monocytes Absolute 0.5 0.0 - 1.0 K/UL    Eosinophils Absolute 0.4 0.0 - 0.4 K/UL    Basophils Absolute 0.1 0.0 - 0.1 K/UL    Immature Granulocytes Absolute 0.0 0.00 - 0.04 K/UL    Differential Type AUTOMATED     Basic Metabolic Panel    Collection Time: 08/03/23 10:05 AM   Result Value Ref Range    Sodium  135 (L) 136 - 145 mmol/L    Potassium 4.1 3.5 - 5.1 mmol/L    Chloride 103 97 - 108 mmol/L    CO2 26 21 - 32 mmol/L    Anion Gap 6 5 - 15 mmol/L    Glucose 112 (H) 65 - 100 mg/dL    BUN 10 6 - 20 MG/DL    Creatinine 9.52 8.41 - 1.30 MG/DL    BUN/Creatinine Ratio 12 12 - 20      Est, Glom Filt Rate >90 >60 ml/min/1.81m2    Calcium 8.8 8.5 - 10.1 MG/DL   Magnesium    Collection Time: 08/03/23 10:05 AM   Result Value Ref Range    Magnesium 1.7 1.6 - 2.4 mg/dL   Phosphorus    Collection Time: 08/03/23 10:05 AM   Result Value Ref Range    Phosphorus 4.1 2.6 - 4.7 MG/DL   POCT Glucose    Collection Time: 08/03/23 11:31 AM   Result Value Ref Range    POC Glucose 99 65 - 117 mg/dL    Performed by: Helyn App            Assessment:     Principal Problem:    Partial small bowel obstruction (HCC)  Active Problems:    Postoperative ileus (HCC)  Resolved Problems:    * No resolved hospital problems. *      6 Days Post-Op s/p  exploratory laparotomy, extensive lysis of adhesions, repair of recurrent paracolostomy hernia with Parietene DS mesh, and revision of ileoileostomy.     Hemodynamically stable.  Hemoglobin stable.  No leukocytosis.  Post-op ileus might be improving.      Plan:   Continue low fiber diet and nutritional supplements.  Begin MiraLAx.    Ambulate.  Physical therapy.  Incentive spirometer.    Lab holiday tomorrow.     Will discharge once there has been sufficient return of GI function.

## 2023-08-04 LAB — POCT GLUCOSE
POC Glucose: 117 mg/dL (ref 65–117)
POC Glucose: 103 mg/dL (ref 65–117)
POC Glucose: 117 mg/dL (ref 65–117)

## 2023-08-04 MED ORDER — POLYETHYLENE GLYCOL 3350 17 G PO PACK
17 g | Freq: Two times a day (BID) | ORAL | 1 refills | Status: AC | PRN
Start: 2023-08-04 — End: 2023-09-04

## 2023-08-04 MED ORDER — OXYCODONE HCL 5 MG PO TABS
5 MG | ORAL_TABLET | ORAL | 0 refills | Status: AC | PRN
Start: 2023-08-04 — End: 2023-08-07

## 2023-08-04 MED FILL — ACETAMINOPHEN EXTRA STRENGTH 500 MG PO TABS: 500 MG | ORAL | Qty: 2

## 2023-08-04 MED FILL — OXYCODONE HCL 5 MG PO TABS: 5 MG | ORAL | Qty: 1

## 2023-08-04 MED FILL — GABAPENTIN 300 MG PO CAPS: 300 MG | ORAL | Qty: 1

## 2023-08-04 MED FILL — LANTUS 100 UNIT/ML SC SOLN: 100 UNIT/ML | SUBCUTANEOUS | Qty: 45

## 2023-08-04 MED FILL — LISINOPRIL 20 MG PO TABS: 20 MG | ORAL | Qty: 2

## 2023-08-04 MED FILL — OXYCODONE HCL 5 MG PO TABS: 5 MG | ORAL | Qty: 2

## 2023-08-04 MED FILL — MIRALAX 17 G PO PACK: 17 g | ORAL | Qty: 1

## 2023-08-04 MED FILL — TAMSULOSIN HCL 0.4 MG PO CAPS: 0.4 MG | ORAL | Qty: 1

## 2023-08-04 MED FILL — PANTOPRAZOLE SODIUM 40 MG PO TBEC: 40 MG | ORAL | Qty: 1

## 2023-08-04 NOTE — Discharge Instructions (Signed)
Discharge Instructions      Do not lift any objects weighing more than 10 pounds for 4 weeks after the surgery date.    Do not do any housework including vacuuming, scrubbing or yardwork for 4 weeks after the surgery date.    Do not drive for 4 weeks after the surgery date or while taking sedating medications.    You should walk frequently and you should go up and down stairs as desired.    You may shower, but do not submerge your abdomen.  Do not take tub baths, swim, or use hot tubs for the next 4 weeks.    You have surgical glue on your incision.  It will dissolve over time.  Do not scrub around the incision or attempt to remove the glue.    Continue the low fiber diet for the next 2 weeks. (See handbook for additional details).     Drink nutritional supplements, one-half bottle up to 4 times per day, until your diet and appetite are back to normal.     It can be normal to have multiple bowel movements each day.   Contact Dr. Corky Crafts office at [phone] for any concerns.    Take maximum doses of Tylenol (1000 mg every 6 hours) until you do not need pain medication any more.  You may take other pain medication (oxycodone) as prescribed in addition to the Tylenol (but not in place of it) if you have already had as much Tylenol as you are allowed to have and your pain is not controlled well enough.    Follow up:  Please return to Dr. Corky Crafts office at 2:15 PM on Monday 08/17/2023.  If you need to change the appointment, please call [phone] on a weekday between 9:00 AM and noon.    If you experience fever (temperature greater than 100.5), chills, vomiting, redness around an incision, or infected-looking drainage from an incision, please contact Dr. Corky Crafts office at [phone].    Please see the [facility] handbook for additional instructions.  If you have further questions, please call Dr. Corky Crafts office at [phone].

## 2023-08-04 NOTE — Other (Signed)
Notes  Patient: Evan Shepherd, Evan Shepherd MRN: [id number]     Note Information    Author Chartered loss adjuster Type Note Type New Service Truddie Crumble, MD Physician Progress Notes New Colorectal Surgery 08/03/23 1421       Note Text    Expand All Collapse All    General Daily Progress Note     Admission Date: 07/28/2023  Hospital Day 7  Post-Op Day 6  Subjective:   Mild nausea last night.  Zofran resolved it.  Very small amount of colostomy output this afternoon.        Objective:   Patient Vitals for the past 24 hrs:    BP Temp Temp src Pulse Resp SpO2   08/03/23 0838 120/73 97.9 F (36.6 C) Oral 72 18 91 %   08/02/23 2004 (!) 141/76 98.6 F (37 C) Oral 76 18 93 %   08/02/23 1921 -- -- -- 94 -- --   08/02/23 1919 122/70 -- -- -- -- --      No intake/output data recorded.  08/03 1901 - 08/05 0700  In: 900 [P.O.:900]  Out: 300 [Urine:300]     Physical Examination:  General Appearance - No distress.  Abdomen - Soft.  Less distended.  Appropriately tender.  Incision clean, dry, and intact; no erythema.  Colostomy viable; scant fluid in the appliance.  Extremities - Pneumatic compression stockings in use.               Data Review   Recent Results         Recent Results (from the past 24 hour(s))   POCT Glucose     Collection Time: 08/02/23  4:18 PM   Result Value Ref Range     POC Glucose 113 65 - 117 mg/dL     Performed by: Odella Aquas     POCT Glucose     Collection Time: 08/02/23  9:45 PM   Result Value Ref Range     POC Glucose 181 (H) 65 - 117 mg/dL     Performed by: Mohammed Kindle     POCT Glucose     Collection Time: 08/03/23  6:34 AM   Result Value Ref Range     POC Glucose 104 65 - 117 mg/dL     Performed by: Mohammed Kindle     CBC with Auto Differential     Collection Time: 08/03/23 10:05 AM   Result Value Ref Range     WBC 7.7 4.1 - 11.1 K/uL     RBC 5.29 4.10 - 5.70 M/uL     Hemoglobin 13.1 12.1 - 17.0 g/dL     Hematocrit 21.3 08.6 - 50.3 %     MCV 76.9 (L) 80.0 - 99.0 FL     MCH 24.8 (L) 26.0 - 34.0 PG      MCHC 32.2 30.0 - 36.5 g/dL     RDW 57.8 (H) 46.9 - 14.5 %     Platelets 248 150 - 400 K/uL     MPV 10.0 8.9 - 12.9 FL     Nucleated RBCs 0.0 0 PER 100 WBC     nRBC 0.00 0.00 - 0.01 K/uL     Neutrophils % 63 32 - 75 %     Lymphocytes % 24 12 - 49 %     Monocytes % 7 5 - 13 %     Eosinophils % 5 0 - 7 %     Basophils % 1  0 - 1 %     Immature Granulocytes % 0 0.0 - 0.5 %     Neutrophils Absolute 4.9 1.8 - 8.0 K/UL     Lymphocytes Absolute 1.9 0.8 - 3.5 K/UL     Monocytes Absolute 0.5 0.0 - 1.0 K/UL     Eosinophils Absolute 0.4 0.0 - 0.4 K/UL     Basophils Absolute 0.1 0.0 - 0.1 K/UL     Immature Granulocytes Absolute 0.0 0.00 - 0.04 K/UL     Differential Type AUTOMATED     Basic Metabolic Panel     Collection Time: 08/03/23 10:05 AM   Result Value Ref Range     Sodium 135 (L) 136 - 145 mmol/L     Potassium 4.1 3.5 - 5.1 mmol/L     Chloride 103 97 - 108 mmol/L     CO2 26 21 - 32 mmol/L     Anion Gap 6 5 - 15 mmol/L     Glucose 112 (H) 65 - 100 mg/dL     BUN 10 6 - 20 MG/DL     Creatinine 6.44 0.34 - 1.30 MG/DL     BUN/Creatinine Ratio 12 12 - 20       Est, Glom Filt Rate >90 >60 ml/min/1.39m2     Calcium 8.8 8.5 - 10.1 MG/DL   Magnesium     Collection Time: 08/03/23 10:05 AM   Result Value Ref Range     Magnesium 1.7 1.6 - 2.4 mg/dL   Phosphorus     Collection Time: 08/03/23 10:05 AM   Result Value Ref Range     Phosphorus 4.1 2.6 - 4.7 MG/DL   POCT Glucose     Collection Time: 08/03/23 11:31 AM   Result Value Ref Range     POC Glucose 99 65 - 117 mg/dL     Performed by: Helyn App                    Assessment:      Principal Problem:    Partial small bowel obstruction (HCC)  Active Problems:    Postoperative ileus (HCC)  Resolved Problems:    * No resolved hospital problems. *        6 Days Post-Op s/p  exploratory laparotomy, extensive lysis of adhesions, repair of recurrent paracolostomy hernia with Parietene DS mesh, and revision of ileoileostomy.     Hemodynamically stable.  Hemoglobin stable.  No  leukocytosis.  Post-op ileus might be improving.        Plan:   Continue low fiber diet and nutritional supplements.  Begin MiraLAx.     Ambulate.  Physical therapy.  Incentive spirometer.     Lab holiday tomorrow.     Will discharge once there has been sufficient return of GI function.

## 2023-08-04 NOTE — Progress Notes (Incomplete)
General Daily Progress Note    Admission Date: 07/28/2023  Hospital Day {0-10:33138}  Post-Op Day {0-10:33138}  Subjective:     Having n more colostomy output.  Feels well eenogh to go home.        Objective:   Patient Vitals for the past 24 hrs:   BP Temp Temp src Pulse Resp SpO2   08/04/23 0933 126/71 98.4 F (36.9 C) Oral 76 18 96 %   08/03/23 1934 136/79 98.2 F (36.8 C) Oral 72 18 95 %   08/03/23 1659 113/63 97.7 F (36.5 C) -- 69 -- --     No intake/output data recorded.  08/04 1901 - 08/06 0700  In: 900 [P.O.:900]  Out: 300 [Urine:300]    Physical Examination: {male adult master:310786}   Physical Examination: {male adult master:310785}             Data Review   Recent Results (from the past 24 hour(s))   POCT Glucose    Collection Time: 08/03/23 11:31 AM   Result Value Ref Range    POC Glucose 99 65 - 117 mg/dL    Performed by: Helyn App    POCT Glucose    Collection Time: 08/03/23  4:54 PM   Result Value Ref Range    POC Glucose 186 (H) 65 - 117 mg/dL    Performed by: Helyn App    POCT Glucose    Collection Time: 08/03/23  9:29 PM   Result Value Ref Range    POC Glucose 117 65 - 117 mg/dL    Performed by: Jearld Pies  PCT    POCT Glucose    Collection Time: 08/04/23  6:46 AM   Result Value Ref Range    POC Glucose 103 65 - 117 mg/dL    Performed by: Olena Leatherwood            Assessment:     Principal Problem:    Partial small bowel obstruction (HCC)  Active Problems:    Postoperative ileus (HCC)  Resolved Problems:    * No resolved hospital problems. *      7 Days Post-Op     Plan:   Ambulate ***  Incentive spirometer ***  Wound care ***  Advance diet ***

## 2023-08-04 NOTE — Discharge Summary (Incomplete)
Discharge Summary     Patient ID:    Evan Shepherd  [id number]  67 y.o.  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 [K43.3]  Partial small bowel obstruction (HCC) [K56.600]    Chronic Diagnoses:    Patient Active Problem List   Diagnosis    Fecal incontinence    Type II diabetes mellitus (HCC)    Hypertension    Colostomy dysfunction (HCC)    Intra-abdominal adhesions    Partial small bowel obstruction (HCC)    Postoperative ileus (HCC)       Discharge Diagnoses:      Procedure(s) Performed:  Procedure(s):  EXPLORATORY LAPAROTOMY, EXTENSIVE LYSIS OF ADHESIONS, REPAIR OF RECURENT PARAHERNIA WITH MESH AND REVISION OF ILEOSTOMY (E.R.A.S.) (ANES GENERAL/EPIDURAL)  Surgical      Discharge Medications:   Current Discharge Medication List        START taking these medications    Details   oxyCODONE (ROXICODONE) 5 MG immediate release tablet Take 1 tablet by mouth every 4 hours as needed for Pain (acute post-operative pain) for up to 3 days. Max Daily Amount: 30 mg  Qty: 30 tablet, Refills: 0    Comments: Reduce doses taken as pain becomes manageable  Associated Diagnoses: Acute post-operative pain      polyethylene glycol (GLYCOLAX) 17 g packet Take 1 packet by mouth 2 times daily as needed for Constipation (Constipation)  Qty: 527 g, Refills: 1           CONTINUE these medications which have NOT CHANGED    Details   tamsulosin (FLOMAX) 0.4 MG capsule Take 1 capsule by mouth nightly      carvedilol (COREG) 6.25 MG tablet Take 2 tablets by mouth 2 times daily (with meals)      TURMERIC PO Take 1 tablet by mouth daily      acetaminophen (TYLENOL) 500 MG tablet Take 2 tablets by mouth every 6 hours      amLODIPine (NORVASC) 10 MG tablet Take 0.5 tablets by mouth every evening      Cholecalciferol 50 MCG (2000 UT) TABS Take 2.5 tablets by mouth every evening       empagliflozin (JARDIANCE) 25 MG tablet Take 1 tablet by mouth daily      gabapentin (NEURONTIN) 300 MG capsule Take 1 capsule by mouth 2 times daily.      insulin aspart (NOVOLOG) 100 UNIT/ML injection pen Inject 20 Units into the skin 3 times daily (before meals) 20 UNITS BEFORE BREAKFAST  35 UNITS BEFORE LUNCH  35 UNITS BEFORE DINNER      insulin glargine (LANTUS SOLOSTAR) 100 UNIT/ML injection pen Inject 54 Units into the skin nightly      ipratropium (ATROVENT) 0.06 % nasal spray 2 sprays by Nasal route 2 times daily      lisinopril (PRINIVIL;ZESTRIL) 40 MG tablet Take 1 tablet by mouth daily      metFORMIN (GLUCOPHAGE) 850 MG tablet Take 1 tablet by mouth 2 times daily (with meals)      pantoprazole (PROTONIX) 40 MG tablet Take 1 tablet by mouth 2 times daily      rosuvastatin (CRESTOR) 10 MG tablet Take 1 tablet by mouth nightly      valACYclovir (VALTREX) 1 g tablet Take 1 tablet by mouth as needed (COLD SORES)      zolpidem (AMBIEN) 10 MG tablet  Take 1 tablet by mouth nightly.              Diet: {diet:18262}    Activity: {discharge activity:18261}    Wound Care:  {wound ZOXW:96045    Discharge Condition: {condition:30010686}    Disposition:      Follow-up Care:  1.  ***  2. No primary care provider on file. in 1-2 weeks      CONSULTATIONS: {consultation:18241}    Significant Diagnostic Studies:   Recent Labs     08/02/23  0604 08/03/23  1005   WBC 7.2 7.7   HGB 13.2 13.1   HCT 40.0 40.7   PLT 234 248     Recent Labs     08/02/23  0604 08/03/23  1005   NA 135* 135*   K 4.6 4.1   CL 103 103   CO2 25 26   BUN 12 10   MG  --  1.7   PHOS  --  4.1     No results for input(s): "TP", "GLOB", "GGT" in the last 72 hours.    Invalid input(s): "SGOT", "GPT", "AP", "TBIL", "TBILI", "ALB", "AML", "AMYP", "LPSE", "HLPSE"  No results for input(s): "INR", "APTT" in the last 72 hours.    Invalid input(s): "PTP"   No results for input(s): "TIBC" in the last 72 hours.    Invalid input(s): "FE", "PSAT", "FERR"   No results for  input(s): "PH", "PCO2", "PO2" in the last 72 hours.  No results for input(s): "CPK", "CKMB", "TROPONINI" in the last 72 hours.  No results found for: "[facility]"      HOSPITAL COURSE & TREATMENT RENDERED:   1. ***    Signed:  Jake Shark, MD  08/04/2023  10:12 AM

## 2023-08-17 DIAGNOSIS — H26492 Other secondary cataract, left eye: Secondary | ICD-10-CM | POA: Diagnosis not present

## 2023-09-10 DIAGNOSIS — E291 Testicular hypofunction: Secondary | ICD-10-CM | POA: Diagnosis not present

## 2023-09-10 DIAGNOSIS — E1122 Type 2 diabetes mellitus with diabetic chronic kidney disease: Secondary | ICD-10-CM | POA: Diagnosis not present

## 2023-09-10 DIAGNOSIS — Z794 Long term (current) use of insulin: Secondary | ICD-10-CM | POA: Diagnosis not present

## 2023-09-10 DIAGNOSIS — E1142 Type 2 diabetes mellitus with diabetic polyneuropathy: Secondary | ICD-10-CM | POA: Diagnosis not present

## 2023-09-10 DIAGNOSIS — N181 Chronic kidney disease, stage 1: Secondary | ICD-10-CM | POA: Diagnosis not present

## 2023-09-15 DIAGNOSIS — H52223 Regular astigmatism, bilateral: Secondary | ICD-10-CM | POA: Diagnosis not present

## 2023-09-15 DIAGNOSIS — Z961 Presence of intraocular lens: Secondary | ICD-10-CM | POA: Diagnosis not present

## 2023-09-15 DIAGNOSIS — H524 Presbyopia: Secondary | ICD-10-CM | POA: Diagnosis not present

## 2023-09-15 DIAGNOSIS — H04213 Epiphora due to excess lacrimation, bilateral lacrimal glands: Secondary | ICD-10-CM | POA: Diagnosis not present

## 2023-09-15 DIAGNOSIS — E113291 Type 2 diabetes mellitus with mild nonproliferative diabetic retinopathy without macular edema, right eye: Secondary | ICD-10-CM | POA: Diagnosis not present

## 2023-09-15 DIAGNOSIS — H40013 Open angle with borderline findings, low risk, bilateral: Secondary | ICD-10-CM | POA: Diagnosis not present

## 2023-09-22 DIAGNOSIS — M5416 Radiculopathy, lumbar region: Secondary | ICD-10-CM | POA: Diagnosis not present

## 2023-09-22 DIAGNOSIS — Z933 Colostomy status: Secondary | ICD-10-CM | POA: Diagnosis not present

## 2023-09-23 DIAGNOSIS — M5416 Radiculopathy, lumbar region: Secondary | ICD-10-CM | POA: Diagnosis not present

## 2023-09-29 DIAGNOSIS — M5416 Radiculopathy, lumbar region: Secondary | ICD-10-CM | POA: Diagnosis not present

## 2023-10-06 DIAGNOSIS — M5416 Radiculopathy, lumbar region: Secondary | ICD-10-CM | POA: Diagnosis not present

## 2023-10-08 ENCOUNTER — Observation Stay (HOSPITAL_COMMUNITY)
Admission: EM | Admit: 2023-10-08 | Discharge: 2023-10-09 | Disposition: A | Discharge status: Home / Self Care | Payer: Medicare HMO | Attending: Family Medicine | Admitting: Family Medicine

## 2023-10-08 ENCOUNTER — Other Ambulatory Visit: Payer: Self-pay

## 2023-10-08 ENCOUNTER — Encounter (HOSPITAL_COMMUNITY): Payer: Self-pay

## 2023-10-08 ENCOUNTER — Emergency Department (HOSPITAL_COMMUNITY): Payer: Medicare HMO

## 2023-10-08 DIAGNOSIS — I6782 Cerebral ischemia: Secondary | ICD-10-CM | POA: Diagnosis not present

## 2023-10-08 DIAGNOSIS — R112 Nausea with vomiting, unspecified: Secondary | ICD-10-CM | POA: Insufficient documentation

## 2023-10-08 DIAGNOSIS — I10 Essential (primary) hypertension: Secondary | ICD-10-CM | POA: Insufficient documentation

## 2023-10-08 DIAGNOSIS — E785 Hyperlipidemia, unspecified: Secondary | ICD-10-CM | POA: Diagnosis present

## 2023-10-08 DIAGNOSIS — Z794 Long term (current) use of insulin: Secondary | ICD-10-CM | POA: Insufficient documentation

## 2023-10-08 DIAGNOSIS — Z79899 Other long term (current) drug therapy: Secondary | ICD-10-CM | POA: Diagnosis not present

## 2023-10-08 DIAGNOSIS — Z7984 Long term (current) use of oral hypoglycemic drugs: Secondary | ICD-10-CM | POA: Insufficient documentation

## 2023-10-08 DIAGNOSIS — E119 Type 2 diabetes mellitus without complications: Secondary | ICD-10-CM | POA: Diagnosis not present

## 2023-10-08 DIAGNOSIS — Z85048 Personal history of other malignant neoplasm of rectum, rectosigmoid junction, and anus: Secondary | ICD-10-CM | POA: Insufficient documentation

## 2023-10-08 DIAGNOSIS — Z933 Colostomy status: Secondary | ICD-10-CM | POA: Insufficient documentation

## 2023-10-08 DIAGNOSIS — R42 Dizziness and giddiness: Secondary | ICD-10-CM | POA: Diagnosis not present

## 2023-10-08 DIAGNOSIS — G319 Degenerative disease of nervous system, unspecified: Secondary | ICD-10-CM | POA: Diagnosis not present

## 2023-10-08 DIAGNOSIS — R4182 Altered mental status, unspecified: Secondary | ICD-10-CM | POA: Diagnosis not present

## 2023-10-08 DIAGNOSIS — I2510 Atherosclerotic heart disease of native coronary artery without angina pectoris: Secondary | ICD-10-CM | POA: Insufficient documentation

## 2023-10-08 DIAGNOSIS — R0789 Other chest pain: Secondary | ICD-10-CM | POA: Diagnosis not present

## 2023-10-08 DIAGNOSIS — R079 Chest pain, unspecified: Secondary | ICD-10-CM

## 2023-10-08 DIAGNOSIS — R404 Transient alteration of awareness: Secondary | ICD-10-CM | POA: Diagnosis not present

## 2023-10-08 DIAGNOSIS — I700 Atherosclerosis of aorta: Secondary | ICD-10-CM | POA: Diagnosis not present

## 2023-10-08 LAB — URINALYSIS, ROUTINE W REFLEX MICROSCOPIC
Bacteria, UA: NONE SEEN
Bilirubin Urine: NEGATIVE
Glucose, UA: >=500 mg/dL — AB
Hgb urine dipstick: NEGATIVE
Ketones, ur: NEGATIVE mg/dL
Leukocytes,Ua: NEGATIVE
Nitrite: NEGATIVE
Protein, ur: NEGATIVE mg/dL
Specific Gravity, Urine: 1.02 (ref 1.005–1.030)
pH: 6 (ref 5.0–8.0)

## 2023-10-08 LAB — DIFFERENTIAL
Abs Immature Granulocytes: 0.02 10*3/uL (ref 0.00–0.07)
Basophils Absolute: 0.1 10*3/uL (ref 0.0–0.1)
Basophils Relative: 1 %
Eosinophils Absolute: 0.1 10*3/uL (ref 0.0–0.5)
Eosinophils Relative: 2 %
Immature Granulocytes: 0 %
Lymphocytes Relative: 24 %
Lymphs Abs: 2 10*3/uL (ref 0.7–4.0)
Monocytes Absolute: 0.3 10*3/uL (ref 0.1–1.0)
Monocytes Relative: 4 %
Neutro Abs: 5.7 10*3/uL (ref 1.7–7.7)
Neutrophils Relative %: 69 %

## 2023-10-08 LAB — COMPREHENSIVE METABOLIC PANEL
ALT: 20 U/L (ref 0–44)
AST: 21 U/L (ref 15–41)
Albumin: 3.7 g/dL (ref 3.5–5.0)
Alkaline Phosphatase: 71 U/L (ref 38–126)
Anion gap: 14 (ref 5–15)
BUN: 18 mg/dL (ref 8–23)
CO2: 21 mmol/L — ABNORMAL LOW (ref 22–32)
Calcium: 9.1 mg/dL (ref 8.9–10.3)
Chloride: 101 mmol/L (ref 98–111)
Creatinine, Ser: 0.83 mg/dL (ref 0.61–1.24)
GFR, Estimated: >60 mL/min (ref >60)
Glucose, Bld: 184 mg/dL — ABNORMAL HIGH (ref 70–99)
Potassium: 4.9 mmol/L (ref 3.5–5.1)
Sodium: 136 mmol/L (ref 135–145)
Total Bilirubin: 0.7 mg/dL (ref 0.3–1.2)
Total Protein: 7.4 g/dL (ref 6.5–8.1)

## 2023-10-08 LAB — I-STAT CHEM 8, ED
BUN: 20 mg/dL (ref 8–23)
Calcium, Ion: 1.09 mmol/L — ABNORMAL LOW (ref 1.15–1.40)
Chloride: 104 mmol/L (ref 98–111)
Creatinine, Ser: 0.8 mg/dL (ref 0.61–1.24)
Glucose, Bld: 185 mg/dL — ABNORMAL HIGH (ref 70–99)
HCT: 47 % (ref 39.0–52.0)
Hemoglobin: 16 g/dL (ref 13.0–17.0)
Potassium: 4.8 mmol/L (ref 3.5–5.1)
Sodium: 135 mmol/L (ref 135–145)
TCO2: 18 mmol/L — ABNORMAL LOW (ref 22–32)

## 2023-10-08 LAB — CBC
HCT: 44.7 % (ref 39.0–52.0)
Hemoglobin: 14.1 g/dL (ref 13.0–17.0)
MCH: 24.8 pg — ABNORMAL LOW (ref 26.0–34.0)
MCHC: 31.5 g/dL (ref 30.0–36.0)
MCV: 78.7 fL — ABNORMAL LOW (ref 80.0–100.0)
Platelets: 262 10*3/uL (ref 150–400)
RBC: 5.68 MIL/uL (ref 4.22–5.81)
RDW: 18 % — ABNORMAL HIGH (ref 11.5–15.5)
WBC: 8.2 10*3/uL (ref 4.0–10.5)
nRBC: 0 % (ref 0.0–0.2)

## 2023-10-08 LAB — HEMOGLOBIN A1C
Hgb A1c MFr Bld: 6.2 % — ABNORMAL HIGH (ref 4.8–5.6)
Mean Plasma Glucose: 131.24 mg/dL

## 2023-10-08 LAB — TROPONIN I (HIGH SENSITIVITY)
Troponin I (High Sensitivity): 5 ng/L (ref <18)
Troponin I (High Sensitivity): 4 ng/L (ref <18)

## 2023-10-08 LAB — APTT: aPTT: 25 s (ref 24–36)

## 2023-10-08 LAB — RAPID URINE DRUG SCREEN, HOSP PERFORMED
Amphetamines: NOT DETECTED
Barbiturates: NOT DETECTED
Benzodiazepines: NOT DETECTED
Cocaine: NOT DETECTED
Opiates: NOT DETECTED
Tetrahydrocannabinol: NOT DETECTED

## 2023-10-08 LAB — PROTIME-INR
INR: 1 (ref 0.8–1.2)
Prothrombin Time: 13.4 s (ref 11.4–15.2)

## 2023-10-08 LAB — ETHANOL: Alcohol, Ethyl (B): <10 mg/dL (ref <10)

## 2023-10-08 MED ORDER — INSULIN GLARGINE-YFGN 100 UNIT/ML ~~LOC~~ SOLN
45.0000 [IU] | Freq: Every day | SUBCUTANEOUS | Status: DC
  Administered 2023-10-09: 40 [IU] via SUBCUTANEOUS
  Filled 2023-10-08 (×2): qty 0.45

## 2023-10-08 MED ORDER — CLOPIDOGREL BISULFATE 300 MG PO TABS
300.0000 mg | ORAL_TABLET | Freq: Once | ORAL | Status: AC
  Administered 2023-10-08: 300 mg via ORAL
  Filled 2023-10-08: qty 1

## 2023-10-08 MED ORDER — IOHEXOL 350 MG/ML SOLN
75.0000 mL | Freq: Once | INTRAVENOUS | Status: AC | PRN
  Administered 2023-10-08: 75 mL via INTRAVENOUS

## 2023-10-08 MED ORDER — ROSUVASTATIN CALCIUM 5 MG PO TABS: 10.0000 mg | ORAL_TABLET | Freq: Every evening | ORAL | Status: DC

## 2023-10-08 MED ORDER — CLOPIDOGREL BISULFATE 75 MG PO TABS
75.0000 mg | ORAL_TABLET | Freq: Every day | ORAL | Status: DC
  Administered 2023-10-09: 75 mg via ORAL
  Filled 2023-10-08: qty 1

## 2023-10-08 MED ORDER — FONDAPARINUX SODIUM 2.5 MG/0.5ML ~~LOC~~ SOLN
2.5000 mg | SUBCUTANEOUS | Status: DC
  Administered 2023-10-09: 2.5 mg via SUBCUTANEOUS
  Filled 2023-10-08: qty 0.5

## 2023-10-08 MED ORDER — INSULIN ASPART 100 UNIT/ML IJ SOLN
0.0000 [IU] | Freq: Three times a day (TID) | INTRAMUSCULAR | Status: DC
  Administered 2023-10-09: 1 [IU] via SUBCUTANEOUS
  Administered 2023-10-09: 2 [IU] via SUBCUTANEOUS

## 2023-10-08 MED ORDER — INSULIN ASPART 100 UNIT/ML IJ SOLN: 0.0000 [IU] | Freq: Every day | INTRAMUSCULAR | Status: DC

## 2023-10-08 MED ORDER — SERTRALINE HCL 50 MG PO TABS
25.0000 mg | ORAL_TABLET | Freq: Every day | ORAL | Status: DC
  Administered 2023-10-09: 25 mg via ORAL
  Filled 2023-10-08: qty 1

## 2023-10-08 MED ORDER — PANTOPRAZOLE SODIUM 40 MG PO TBEC
40.0000 mg | DELAYED_RELEASE_TABLET | Freq: Two times a day (BID) | ORAL | Status: DC
  Administered 2023-10-09: 40 mg via ORAL
  Filled 2023-10-08 (×2): qty 1

## 2023-10-08 MED ORDER — ACETAMINOPHEN 650 MG RE SUPP: 650.0000 mg | Freq: Four times a day (QID) | RECTAL | Status: DC | PRN

## 2023-10-08 MED ORDER — STROKE: EARLY STAGES OF RECOVERY BOOK
Freq: Once | Status: AC
  Filled 2023-10-08: qty 1

## 2023-10-08 MED ORDER — ACETAMINOPHEN 325 MG PO TABS: 650.0000 mg | ORAL_TABLET | Freq: Four times a day (QID) | ORAL | Status: DC | PRN

## 2023-10-08 MED ORDER — TAMSULOSIN HCL 0.4 MG PO CAPS
0.4000 mg | ORAL_CAPSULE | Freq: Every day | ORAL | Status: DC
  Administered 2023-10-09: 0.4 mg via ORAL
  Filled 2023-10-08: qty 1

## 2023-10-08 NOTE — H&P (Signed)
History and Physical    Phillip Tate:[id number] DOB: [birth year] DOA: 10/08/2023  PCP: Georgann Housekeeper, MD  Patient coming from: Home  Chief Complaint: Dizziness  HPI: Phillip Tate is a 67 y.o. male with medical history significant of CAD, insulin-dependent type 2 diabetes, hypertension, hyperlipidemia, history of rectal cancer status with colostomy, recurrent SBO's, BPH, anemia, GERD.  Patient states he normally walks a mile every day.  Today he went for a walk and after just 1 block, he started feeling sick.  He was having "a weird feeling" in his chest at that time and went back home.  After he sat down, he started sweating, felt nauseous, and had an episode of vomiting. He started feeling dizzy.  He reports history of vertigo but states this episode was different as the room was not spinning.  After vomiting, he initially felt better for about an hour but then started feeling dizzy again and had another episode of vomiting.  Patient states he was informed by his family that he was also very confused during this episode.  He did not know why he was sitting in a chair and did not know why he had an ostomy bag.  Also he did not remember that his sister was recently hospitalized.  States the confusion lasted less than 30 minutes and fully resolved.  Patient denies abdominal pain and reports normal stool output from his ostomy/no change.  No longer nauseous now and no longer having any chest pain/pressure/discomfort.  No other complaints.  ED Course: Vital signs stable.  Labs showing no leukocytosis, hemoglobin stable, glucose 184, normal LFTs, blood ethanol level undetectable, troponin negative x 2, UDS negative, UA not suggestive of infection.  CT head negative for acute intracranial abnormality and CTA head and neck negative for LVO.  Brain MRI negative for acute intracranial abnormality. Neurology recommended TIA workup and EEG.   Also ordered Plavix 300 mg once followed by 75 mg  daily.  TRH called to admit.  Review of Systems:  Review of Systems  All other systems reviewed and are negative.   Past Medical History:  Diagnosis Date   Coronary artery disease    DDD (degenerative disc disease), lumbar    Diabetes mellitus without complication (HCC)    Dyslipidemia    History of rectal cancer    dx 2006 --  Stage III, T3  N1  M0,  s/p  chemoradiation (07-10-2005 to 09-05-2005) and anterior colon resection 11-04-2006   History of shingles    Hyperlipidemia    Hypertension    Iron deficiency anemia    intolerent PO iron--  gets IV iron,  last one 03-12-2015 ([facility])   Organic impotence    Rectal cancer (HCC)    adenocarcinoma stage lll    Past Surgical History:  Procedure Laterality Date   ANAL RECTAL MANOMETRY N/A 09/04/2021   Procedure: ANO RECTAL MANOMETRY;  Surgeon: Napoleon Form, MD;  Location: [facility];  Service: Endoscopy;  Laterality: N/A;   ANTERIOR COLON RESECTION W/ COLOSTOMY  11/04/2006   CATARACT EXTRACTION W/ INTRAOCULAR LENS IMPLANT Right sept 2015  &  feb 2016   COLONOSCOPY WITH ESOPHAGOGASTRODUODENOSCOPY (EGD)  last one 03-07-2010   COLONOSCOPY WITH PROPOFOL N/A 10/23/2015   Procedure: COLONOSCOPY WITH PROPOFOL;  Surgeon: Charolett Bumpers, MD;  Location: [facility];  Service: Endoscopy;  Laterality: N/A;   COLOSTOMY TAKEDOWN  05/2006   EXAMINATION UNDER ANESTHESIA N/A 04/23/2015   Procedure: EXAM UNDER ANESTHESIA, MANIPULATION OF PENILE PROSTHESIS PUMP;  Surgeon: Ihor Gully, MD;  Location: [facility];  Service: Urology;  Laterality: N/A;   HERNIA REPAIR     PENILE PROSTHESIS PLACEMENT  10/29/2009   PORT A CATH PLACEMENT  07/23/2005   prolapsed hemorrhoid       reports that he has never smoked. He has never used smokeless tobacco. He reports current alcohol use. He reports that he does not use drugs.  Allergies  Allergen Reactions   Cardizem Cd [Diltiazem Hcl Er Beads] Swelling   Pork-Derived Products      THIS IS A RELIGIOUS PREFERENCE   Toprol Xl [Metoprolol] Other (See Comments)    headache   Tricor [Fenofibrate] Other (See Comments)    Constipation     Family History  Problem Relation Age of Onset   Hypertension Father    Diabetes Mother    Heart attack Neg Hx    Colon cancer Neg Hx    Esophageal cancer Neg Hx    Rectal cancer Neg Hx    Stomach cancer Neg Hx     Prior to Admission medications   Medication Sig Start Date End Date Taking? Authorizing Provider  acetaminophen (TYLENOL) 500 MG tablet Take 500 mg by mouth every 6 (six) hours as needed for mild pain.   Yes [provider]  amLODipine (NORVASC) 5 MG tablet Take 5 mg by mouth daily.   Yes [provider]  ascorbic acid (VITAMIN C) 500 MG tablet Take 500 mg by mouth 2 (two) times daily.   Yes [provider]  carvedilol (COREG) 3.125 MG tablet Take 8 tablets (25 mg total) by mouth 2 (two) times daily with a meal. Patient taking differently: Take 6.25 mg by mouth 2 (two) times daily with a meal. 05/23/23 10/07/24 Yes Barnetta Chapel, MD  Cholecalciferol (VITAMIN D) 125 MCG (5000 UT) CAPS Take 5,000 Units by mouth daily.   Yes [provider]  empagliflozin (JARDIANCE) 25 MG TABS tablet Take 25 mg by mouth daily.   Yes [provider]  Ferrous Sulfate (IRON PO) Take 1 tablet by mouth daily.   Yes [provider]  gabapentin (NEURONTIN) 300 MG capsule Take 300 mg by mouth 2 (two) times daily. 08/02/16  Yes [provider]  insulin glargine (LANTUS) 100 UNIT/ML injection Inject 0.2 mLs (20 Units total) into the skin at bedtime. Patient taking differently: Inject 45 Units into the skin at bedtime. 05/23/23  Yes Berton Mount I, MD  lisinopril (ZESTRIL) 40 MG tablet Take 1 tablet (40 mg total) by mouth daily. 01/22/23  Yes Marinda Elk, MD  metFORMIN (GLUCOPHAGE) 850 MG tablet Take 850 mg by mouth 2 (two) times daily with a meal.   Yes [provider]  NOVOLOG FLEXPEN 100 UNIT/ML FlexPen Inject 20-40 Units into the skin 3 (three) times daily with meals. Per sliding scale 07/18/16  Yes [provider]  OZEMPIC, 0.25 OR 0.5 MG/DOSE, 2 MG/3ML SOPN Inject 0.25 mg into the skin once a week. 09/10/23  Yes [provider]  pantoprazole (PROTONIX) 40 MG tablet Take 1 tablet (40 mg total) by mouth 2 (two) times daily. 05/23/23 10/07/24 Yes Barnetta Chapel, MD  Polyethyl Glycol-Propyl Glycol (SYSTANE FREE OP) Place 1 drop into both eyes 3 (three) times daily.   Yes [provider]  polyethylene glycol (MIRALAX / GLYCOLAX) 17 g packet Take 17 g by mouth daily as needed.   Yes [provider]  rosuvastatin (CRESTOR) 10 MG tablet Take 1 tablet (  10 mg total) by mouth daily. Patient taking differently: Take 10 mg by mouth every evening. 09/18/16  Yes Corky Crafts, MD  sertraline (ZOLOFT) 25 MG tablet Take 25 mg by mouth daily. 06/18/22  Yes [provider]  tamsulosin (FLOMAX) 0.4 MG CAPS capsule Take 0.4 mg by mouth at bedtime.   Yes [provider]  traMADol (ULTRAM) 50 MG tablet Take 50 mg by mouth 3 (three) times daily as needed. 09/30/23  Yes [provider]  valACYclovir (VALTREX) 1000 MG tablet Take 1,000 mg by mouth daily as needed (cold sores).   Yes [provider]  zolpidem (AMBIEN) 10 MG tablet Take 10 mg by mouth at bedtime as needed for sleep. 02/02/23  Yes [provider]    Physical Exam: Vitals:   10/08/23 1745 10/08/23 2033 10/08/23 2034 10/08/23 2216  BP: 125/72 125/70  125/67  Pulse: 73 66 70 73  Resp: 20 18 17 16   Temp:    98.2 F (36.8 C)  TempSrc:    Oral  SpO2: 100% 99% 99% 100%  Weight:      Height:        Physical Exam Vitals reviewed.  Constitutional:      General: He is not in acute distress. HENT:     Head: Normocephalic and atraumatic.  Eyes:     Extraocular Movements: Extraocular movements intact.  Cardiovascular:     Rate  and Rhythm: Normal rate and regular rhythm.     Pulses: Normal pulses.  Pulmonary:     Effort: Pulmonary effort is normal. No respiratory distress.     Breath sounds: Normal breath sounds. No wheezing or rales.  Abdominal:     General: Bowel sounds are normal. There is no distension.     Palpations: Abdomen is soft.     Tenderness: There is no abdominal tenderness. There is no guarding.  Musculoskeletal:     Cervical back: Normal range of motion.     Right lower leg: No edema.     Left lower leg: No edema.  Skin:    General: Skin is warm and dry.  Neurological:     General: No focal deficit present.     Mental Status: He is alert and oriented to person, place, and time.     Labs on Admission: I have personally reviewed following labs and imaging studies  CBC: Recent Labs  Lab 10/08/23 1605 10/08/23 1620  WBC 8.2  --   NEUTROABS 5.7  --   HGB 14.1 16.0  HCT 44.7 47.0  MCV 78.7*  --   PLT 262  --    Basic Metabolic Panel: Recent Labs  Lab 10/08/23 1605 10/08/23 1620  NA 136 135  K 4.9 4.8  CL 101 104  CO2 21*  --   GLUCOSE 184* 185*  BUN 18 20  CREATININE 0.83 0.80  CALCIUM 9.1  --    GFR: Estimated Creatinine Clearance: 106.7 mL/min (by C-G formula based on SCr of 0.8 mg/dL). Liver Function Tests: Recent Labs  Lab 10/08/23 1605  AST 21  ALT 20  ALKPHOS 71  BILITOT 0.7  PROT 7.4  ALBUMIN 3.7   No results for input(s): "LIPASE", "AMYLASE" in the last 168 hours. No results for input(s): "AMMONIA" in the last 168 hours. Coagulation Profile: Recent Labs  Lab 10/08/23 1605  INR 1.0   Cardiac Enzymes: No results for input(s): "CKTOTAL", "CKMB", "CKMBINDEX", "TROPONINI" in the last 168 hours. BNP (last 3 results) No results for  input(s): "PROBNP" in the last 8760 hours. HbA1C: Recent Labs    10/08/23 1607  HGBA1C 6.2*   CBG: No results for input(s): "GLUCAP" in the last 168 hours. Lipid Profile: No results for input(s): "CHOL", "HDL",  "LDLCALC", "TRIG", "CHOLHDL", "LDLDIRECT" in the last 72 hours. Thyroid Function Tests: No results for input(s): "TSH", "T4TOTAL", "FREET4", "T3FREE", "THYROIDAB" in the last 72 hours. Anemia Panel: No results for input(s): "VITAMINB12", "FOLATE", "FERRITIN", "TIBC", "IRON", "RETICCTPCT" in the last 72 hours. Urine analysis:    Component Value Date/Time   COLORURINE YELLOW 10/08/2023 1607   APPEARANCEUR CLEAR 10/08/2023 1607   LABSPEC 1.020 10/08/2023 1607   PHURINE 6.0 10/08/2023 1607   GLUCOSEU >=500 (A) 10/08/2023 1607   HGBUR NEGATIVE 10/08/2023 1607   BILIRUBINUR NEGATIVE 10/08/2023 1607   KETONESUR NEGATIVE 10/08/2023 1607   PROTEINUR NEGATIVE 10/08/2023 1607   UROBILINOGEN 0.2 10/18/2007 1926   NITRITE NEGATIVE 10/08/2023 1607   LEUKOCYTESUR NEGATIVE 10/08/2023 1607    Radiological Exams on Admission: CT ANGIO HEAD NECK W WO CM  Result Date: 10/08/2023 CLINICAL DATA:  Initial evaluation for neuro deficit, stroke. EXAM: CT ANGIOGRAPHY HEAD AND NECK WITH AND WITHOUT CONTRAST TECHNIQUE: Multidetector CT imaging of the head and neck was performed using the standard protocol during bolus administration of intravenous contrast. Multiplanar CT image reconstructions and MIPs were obtained to evaluate the vascular anatomy. Carotid stenosis measurements (when applicable) are obtained utilizing NASCET criteria, using the distal internal carotid diameter as the denominator. RADIATION DOSE REDUCTION: This exam was performed according to the departmental dose-optimization program which includes automated exposure control, adjustment of the mA and/or kV according to patient size and/or use of iterative reconstruction technique. CONTRAST:  75mL OMNIPAQUE IOHEXOL 350 MG/ML SOLN COMPARISON:  None Available. FINDINGS: CT HEAD FINDINGS Brain: Mild atrophy with chronic small vessel ischemic disease. No acute intracranial hemorrhage. No acute large vessel territory infarct. No mass lesion or midline  shift. No hydrocephalus or extra-axial fluid collection. Vascular: No abnormal hyperdense vessel. Skull: Scalp soft tissues and calvarium demonstrate no acute finding. Sinuses/Orbits: Globes orbital soft tissues within normal limits. Paranasal sinuses are largely clear. No mastoid effusion. Other: None. Review of the MIP images confirms the above findings CTA NECK FINDINGS Aortic arch: Visualized arch within normal limits for caliber with standard branch pattern. Mild aortic atherosclerosis. No stenosis. Right carotid system: Right common and internal carotid arteries are patent without dissection. Mild atheromatous change about the right carotid bulb without hemodynamically significant greater than 50% stenosis. Left carotid system: Left common and internal carotid arteries are patent without dissection. Mild atheromatous change about the left carotid bulb without hemodynamically significant greater than 50% stenosis. Vertebral arteries: No evidence of dissection, stenosis (50% or greater), or occlusion. Skeleton: No discrete or worrisome osseous lesions. Moderately advanced spondylosis throughout the visualized cervicothoracic spine. Other neck: No other acute finding. Upper chest: No other acute finding. Review of the MIP images confirms the above findings CTA HEAD FINDINGS Anterior circulation: Mild atheromatous change about the carotid siphons without stenosis. A1 segments, anterior communicating artery complex common anterior cerebral arteries patent without stenosis. No M1 stenosis or occlusion. Distal MCA branches perfused and symmetric. Posterior circulation: Dominant left V4 segment patent without stenosis. Focal atheromatous plaque with associated moderate short-segment stenosis noted within the proximal right V4 segment (series 11, image 149). Both PICA patent. Basilar widely patent without stenosis. Superior cerebellar and posterior cerebral arteries widely patent bilaterally. Venous sinuses: Patent  allowing for timing the contrast bolus. Anatomic variants: As above.  No aneurysm. Review of the MIP images confirms the above findings IMPRESSION: CT HEAD IMPRESSION: 1. No acute intracranial abnormality. 2. Mild atrophy with chronic small vessel ischemic disease. CTA HEAD AND NECK IMPRESSION: 1. Negative CTA for large vessel occlusion or other emergent finding. 2. Focal atheromatous plaque with associated moderate short-segment stenosis within the proximal right V4 segment. 3. Mild atheromatous change about the carotid bifurcations and carotid siphons without hemodynamically significant stenosis. 4. Aortic Atherosclerosis (ICD10-I70.0). Electronically Signed   By: Rise Mu M.D.   On: 10/08/2023 20:59   MR BRAIN WO CONTRAST  Result Date: 10/08/2023 CLINICAL DATA:  Mental status change, unknown cause EXAM: MRI HEAD WITHOUT CONTRAST TECHNIQUE: Multiplanar, multiecho pulse sequences of the brain and surrounding structures were obtained without intravenous contrast. COMPARISON:  CT head 12/09/2022. FINDINGS: Brain: No acute infarction, hemorrhage, hydrocephalus, extra-axial collection or mass lesion. Mild to moderate for age scattered small T2/FLAIR hyperintensities within the white matter are nonspecific but compatible with chronic microvascular ischemic change. Vascular: Major arterial flow voids are maintained skull base. Skull and upper cervical spine: Normal marrow signal. Sinuses/Orbits: Negative. Other: No sizable mastoid effusions. IMPRESSION: No evidence of acute intracranial abnormality. Electronically Signed   By: Feliberto Harts M.D.   On: 10/08/2023 19:57    EKG: Independently reviewed.  Sinus rhythm, PVCs, T wave inversions in lateral leads.  No acute ischemic changes when compared to previous EKG from May 2024.  Assessment and Plan  Sudden onset dizziness/vertigo associated with transient confusion/amnesia Symptoms have resolved.  Blood ethanol level undetectable, UDS negative,  UA not suggestive of infection.  CT head negative for acute intracranial abnormality and CTA head and neck negative for LVO.  Brain MRI negative for acute intracranial abnormality. Neurology recommended TIA workup and EEG.    -Telemetry monitoring -Echocardiogram -EEG -A1c 6.2 -Lipid panel pending -Neurology ordered Plavix 300 mg once followed by 75 mg daily. -Appreciate neurology recommendations -Frequent neurochecks -PT, OT  Chest discomfort History of CAD Troponin negative x 2 and not consistent with ACS. No longer having active chest pain/pressure/discomfort and appears comfortable.  PE less likely given no tachycardia or hypoxia.  Nausea and vomiting Symptoms related to episode of dizziness/vertigo, now resolved.  No fever or leukocytosis.  Normal LFTs.  No abdominal pain/tenderness or distention.  Patient reports normal stool output from ostomy/no change.  Insulin-dependent type 2 diabetes A1c 6.2.  Continue home long-acting insulin.  Sensitive sliding scale insulin ordered.  Hypertension Currently normotensive.  Hold antihypertensives at this time and check orthostatics.  Fall precautions.  Hyperlipidemia Continue Crestor.  BPH Continue Flomax.  GERD Continue Protonix.  Mood disorder Continue Zoloft.  DVT prophylaxis: Fondaparinux Code Status: Full Code (discussed with the patient) Level of care: Telemetry bed Admission status: It is my clinical opinion that referral for OBSERVATION is reasonable and necessary in this patient based on the above information provided. The aforementioned taken together are felt to place the patient at high risk for further clinical deterioration. However, it is anticipated that the patient may be medically stable for discharge from the hospital within 24 to 48 hours.  John Giovanni MD [facility]  If 7PM-7AM, please contact night-coverage [email or web address]  10/08/2023, 10:33 PM

## 2023-10-08 NOTE — ED Provider Triage Note (Signed)
Emergency Medicine Provider Triage Evaluation Note  Phillip Tate , a 67 y.o. male  was evaluated in triage.  Pt complains of sudden onset diaphoresis nausea and vomiting.  Patient also had associated chest pressure and dizziness.  He does have a history of vertigo but states the room was not spinning.  Wife and daughter states that patient also became confused.  He has a colostomy bag and did not know why it was there was confused about recent events with his sister.  States his confusion lasted for approximately 30 minutes and fully resolved.  He has never had anything like this before.  His daughter.  Review of Systems  Positive: Transient confusion/ amnesia Negative: fever  Physical Exam  BP 136/79   Pulse 81   Temp 97.6 F (36.4 C)   Resp 18   Ht 5\' 10"  (1.778 m)   Wt 101 kg   SpO2 98%   BMI 31.95 kg/m  Gen:   Awake, no distress   Resp:  Normal effort  MSK:   Moves extremities without difficulty  Other:    Medical Decision Making  Medically screening exam initiated at 4:07 PM.  Appropriate orders placed.  Phillip Tate was informed that the remainder of the evaluation will be completed by another provider, this initial triage assessment does not replace that evaluation, and the importance of remaining in the ED until their evaluation is complete.  Case discussed with Dr. Derry Lory.  Agrees with current workup adds that patient will need a brain MRI.   Arthor Captain, PA-C 10/08/23 1610

## 2023-10-08 NOTE — ED Provider Notes (Signed)
[place] [facility]ENC[facility] DEPARTMENT AT [facility] Provider Note   CSN: [id number] Arrival date & time: 10/08/23  1547     History  Chief Complaint  Patient presents with   Emesis   Altered Mental Status    Phillip Tate is a 67 y.o. male with PMH as listed below who presents with sudden onset diaphoresis, nausea and vomiting.  Patient also had associated chest pressure and dizziness.  He does have a history of vertigo but states the room was not spinning, and that this did not feel like his prior episodes of vertigo at all.  Wife and daughter states that patient also became confused.  He has a colostomy bag and did not know why it was there was confused about recent events with his sister.  States his symptoms lasted for approximately 30 minutes and fully resolved.  He has never had anything like this before.  He does remember most of the episode but there are parts that he does not remember. Per him and the family he did not have any numbness/tingling, asymmetric weakness, slurred speech, facial droop, headache, visual changes, recent illnesses, f/c. Patient doesn't take any blood thinners.     Past Medical History:  Diagnosis Date   Coronary artery disease    DDD (degenerative disc disease), lumbar    Diabetes mellitus without complication (HCC)    Dyslipidemia    History of rectal cancer    dx 2006 --  Stage III, T3  N1  M0,  s/p  chemoradiation (07-10-2005 to 09-05-2005) and anterior colon resection 11-04-2006   History of shingles    Hyperlipidemia    Hypertension    Iron deficiency anemia    intolerent PO iron--  gets IV iron,  last one 03-12-2015 ([facility])   Organic impotence    Rectal cancer (HCC)    adenocarcinoma stage lll       Home Medications Prior to Admission medications   Medication Sig Start Date End Date Taking? Authorizing Provider  acetaminophen (TYLENOL) 500 MG tablet Take 500 mg by mouth every 6 (six) hours as needed for mild pain.   Yes  [provider]  ascorbic acid (VITAMIN C) 500 MG tablet Take 500 mg by mouth 2 (two) times daily.   Yes [provider]  Cholecalciferol (VITAMIN D) 125 MCG (5000 UT) CAPS Take 5,000 Units by mouth daily.   Yes [provider]  empagliflozin (JARDIANCE) 25 MG TABS tablet Take 25 mg by mouth daily.   Yes [provider]  Ferrous Sulfate (IRON PO) Take 1 tablet by mouth daily.   Yes [provider]  gabapentin (NEURONTIN) 300 MG capsule Take 300 mg by mouth 2 (two) times daily. 08/02/16  Yes [provider]  insulin glargine (LANTUS) 100 UNIT/ML injection Inject 0.2 mLs (20 Units total) into the skin at bedtime. Patient taking differently: Inject 45 Units into the skin at bedtime. 05/23/23  Yes Berton Mount I, MD  lisinopril (ZESTRIL) 40 MG tablet Take 1 tablet (40 mg total) by mouth daily. 01/22/23  Yes Marinda Elk, MD  metFORMIN (GLUCOPHAGE) 850 MG tablet Take 850 mg by mouth 2 (two) times daily with a meal.   Yes [provider]  NOVOLOG FLEXPEN 100 UNIT/ML FlexPen Inject 20-40 Units into the skin 3 (three) times daily with meals. Per sliding scale 07/18/16  Yes [provider]  OZEMPIC, 0.25 OR 0.5 MG/DOSE, 2 MG/3ML SOPN Inject 0.25 mg into the skin once a week. 09/10/23  Yes [provider]  pantoprazole (PROTONIX) 40 MG tablet Take 1 tablet (40 mg total) by mouth 2 (two) times daily. 05/23/23 10/07/24 Yes Barnetta Chapel, MD  Polyethyl Glycol-Propyl Glycol (SYSTANE FREE OP) Place 1 drop into both eyes 3 (three) times daily.   Yes [provider]  polyethylene glycol (MIRALAX / GLYCOLAX) 17 g packet Take 17 g by mouth daily as needed.   Yes [provider]  rosuvastatin (CRESTOR) 10 MG tablet Take 1 tablet (10 mg total) by mouth daily. Patient taking differently: Take 10 mg by mouth every evening. 09/18/16  Yes Corky Crafts, MD  sertraline (ZOLOFT) 25 MG tablet Take 25 mg by mouth  daily. 06/18/22  Yes [provider]  tamsulosin (FLOMAX) 0.4 MG CAPS capsule Take 0.4 mg by mouth at bedtime.   Yes [provider]  zolpidem (AMBIEN) 10 MG tablet Take 10 mg by mouth at bedtime as needed for sleep. 02/02/23  Yes [provider]  carvedilol (COREG) 3.125 MG tablet Take 2 tablets (6.25 mg total) by mouth 2 (two) times daily with a meal. 10/09/23 11/08/23  Rhetta Mura, MD  traMADol (ULTRAM) 50 MG tablet Take 1 tablet (50 mg total) by mouth 2 (two) times daily as needed. 10/09/23   Rhetta Mura, MD      Allergies    Cardizem cd [diltiazem hcl er beads], Pork-derived products, Toprol xl [metoprolol], and Tricor [fenofibrate]    Review of Systems   Review of Systems A 10 point review of systems was performed and is negative unless otherwise reported in HPI.  Physical Exam Updated Vital Signs BP 131/76 (BP Location: Left Arm)   Pulse 85   Temp 98.2 F (36.8 C) (Oral)   Resp 18   Ht 5\' 10"  (1.778 m)   Wt 101 kg   SpO2 99%   BMI 31.95 kg/m  Physical Exam General: Normal appearing male, lying in bed.  HEENT: PERRLA, Sclera anicteric, MMM, trachea midline.  Cardiology: RRR, no murmurs/rubs/gallops. BL radial and DP pulses equal bilaterally.  Resp: Normal respiratory rate and effort. CTAB, no wheezes, rhonchi, crackles.  Abd: Soft, non-tender, non-distended. No rebound tenderness or guarding.  GU: Deferred. MSK: No peripheral edema or signs of trauma. Extremities without deformity or TTP. No cyanosis or clubbing. Skin: warm, dry. No rashes or lesions. Back: No CVA tenderness Neuro: A&Ox4, CNs II-XII grossly intact. MAEs. Sensation grossly intact.  Psych: Normal mood and affect.   ED Results / Procedures / Treatments   Labs (all labs ordered are listed, but only abnormal results are displayed) Labs Reviewed  CBC - Abnormal; Notable for the following components:      Result Value   MCV 78.7 (*)    MCH 24.8 (*)    RDW 18.0  (*)    All other components within normal limits  COMPREHENSIVE METABOLIC PANEL - Abnormal; Notable for the following components:   CO2 21 (*)    Glucose, Bld 184 (*)    All other components within normal limits  URINALYSIS, ROUTINE W REFLEX MICROSCOPIC - Abnormal; Notable for the following components:   Glucose, UA >=500 (*)    All other components within normal limits  LIPID PANEL - Abnormal; Notable for the following components:   Triglycerides 165 (*)    HDL 34 (*)    All other components within normal limits  HEMOGLOBIN A1C - Abnormal; Notable for the following components:   Hgb A1c MFr Bld 6.2 (*)    All other  components within normal limits  I-STAT CHEM 8, ED - Abnormal; Notable for the following components:   Glucose, Bld 185 (*)    Calcium, Ion 1.09 (*)    TCO2 18 (*)    All other components within normal limits  CBG MONITORING, ED - Abnormal; Notable for the following components:   Glucose-Capillary 135 (*)    All other components within normal limits  CBG MONITORING, ED - Abnormal; Notable for the following components:   Glucose-Capillary 136 (*)    All other components within normal limits  CBG MONITORING, ED - Abnormal; Notable for the following components:   Glucose-Capillary 166 (*)    All other components within normal limits  ETHANOL  PROTIME-INR  APTT  DIFFERENTIAL  RAPID URINE DRUG SCREEN, HOSP PERFORMED  TROPONIN I (HIGH SENSITIVITY)  TROPONIN I (HIGH SENSITIVITY)    EKG EKG Interpretation Date/Time:  Thursday October 08 2023 16:09:22 EDT Ventricular Rate:  82 PR Interval:  144 QRS Duration:  100 QT Interval:  376 QTC Calculation: 439 R Axis:   85  Text Interpretation: Sinus rhythm with occasional Premature ventricular complexes Nonspecific T wave abnormality Confirmed by Vivi Barrack [phone]) on 10/08/2023 9:00:44 PM  Radiology No results found.  Procedures Procedures    Medications Ordered in ED Medications  iohexol (OMNIPAQUE) 350  MG/ML injection 75 mL (75 mLs Intravenous Contrast Given 10/08/23 1854)   stroke: early stages of recovery book ( Does not apply Given 10/09/23 1101)  clopidogrel (PLAVIX) tablet 300 mg (300 mg Oral Given 10/08/23 2314)    ED Course/ Medical Decision Making/ A&P                          Medical Decision Making Amount and/or Complexity of Data Reviewed Radiology:  Decision-making details documented in ED Course.  Risk Decision regarding hospitalization.    This patient presents to the ED for concern of episode of N/V/dizziness/confusion now resolved, this involves an extensive number of treatment options, and is a complaint that carries with it a high risk of complications and morbidity.  I considered the following differential and admission for this acute, potentially life threatening condition.   MDM:    High degree of concern for posterior circulation CVA/TIA given that his symptoms fully resolved. He has no ongoing neurologic deficits now. PA Harris from triage discussed with neurology who agreed the patient will need Brain MRI to rule out stroke. Also consider episode of peripheral vertigo but states the room wasn't spinning and didn't feel like prior episodes of vertigo. He has no nystagmus and is asymptomatic now. Consider presyncope/vagal episode, electrolyte derangements, hypo/hyperglycemia, focal seizure, TGA. Also consider atypical ACS/arrhythmia - EKG without signs of ischemia, will get a troponin. No sxs of infection, no neck stiffness, no f/c to suggest meningitis.   Clinical Course as of 10/12/23 0257  Thu Oct 08, 2023  1730 CBC, CMP, PTT, PT/INR, EtOH, and initial troponin unremarkable in the context of this patient's presentation [HN]  2011 UA and UDS unremarkable.  Repeat troponin also negative. [HN]  2012 MR BRAIN WO CONTRAST normal [HN]    Clinical Course User Index [HN] Loetta Rough, MD    Labs: I Ordered, and personally interpreted labs.  The pertinent  results include:  those listed above  Imaging Studies ordered: CTA, MRI brain ordered from triage I independently visualized and interpreted imaging. I agree with the radiologist interpretation  Additional history obtained from chart review.  Reevaluation: I reevaluated the patient and found that they have :resolved  Social Determinants of Health: Lives independently  Disposition:  D/w neurology who recommended admission for TIA w/u and EEG to r/o seizure activity. Patient is admitted to hospitalist.   Co morbidities that complicate the patient evaluation  Past Medical History:  Diagnosis Date   Coronary artery disease    DDD (degenerative disc disease), lumbar    Diabetes mellitus without complication (HCC)    Dyslipidemia    History of rectal cancer    dx 2006 --  Stage III, T3  N1  M0,  s/p  chemoradiation (07-10-2005 to 09-05-2005) and anterior colon resection 11-04-2006   History of shingles    Hyperlipidemia    Hypertension    Iron deficiency anemia    intolerent PO iron--  gets IV iron,  last one 03-12-2015 (Inf[facility])   Organic impotence    Rectal cancer (HCC)    adenocarcinoma stage lll     Medicines Meds ordered this encounter  Medications   iohexol (OMNIPAQUE) 350 MG/ML injection 75 mL    stroke: early stages of recovery book   clopidogrel (PLAVIX) tablet 300 mg   DISCONTD: clopidogrel (PLAVIX) tablet 75 mg   DISCONTD: rosuvastatin (CRESTOR) tablet 10 mg   DISCONTD: sertraline (ZOLOFT) tablet 25 mg   DISCONTD: insulin glargine-yfgn (SEMGLEE) injection 45 Units   DISCONTD: pantoprazole (PROTONIX) EC tablet 40 mg   DISCONTD: tamsulosin (FLOMAX) capsule 0.4 mg   DISCONTD: fondaparinux (ARIXTRA) injection 2.5 mg   DISCONTD: acetaminophen (TYLENOL) tablet 650 mg   DISCONTD: acetaminophen (TYLENOL) suppository 650 mg   DISCONTD: insulin aspart (novoLOG) injection 0-9 Units    Order Specific Question:   Correction coverage:    Answer:   Sensitive (thin, NPO,  renal)    Order Specific Question:   CBG < 70:    Answer:   Implement Hypoglycemia Standing Orders and refer to Hypoglycemia Standing Orders sidebar report    Order Specific Question:   CBG 70 - 120:    Answer:   0 units    Order Specific Question:   CBG 121 - 150:    Answer:   1 unit    Order Specific Question:   CBG 151 - 200:    Answer:   2 units    Order Specific Question:   CBG 201 - 250:    Answer:   3 units    Order Specific Question:   CBG 251 - 300:    Answer:   5 units    Order Specific Question:   CBG 301 - 350:    Answer:   7 units    Order Specific Question:   CBG 351 - 400    Answer:   9 units    Order Specific Question:   CBG > 400    Answer:   call MD and obtain STAT lab verification   DISCONTD: insulin aspart (novoLOG) injection 0-5 Units    Order Specific Question:   Correction coverage:    Answer:   HS scale    Order Specific Question:   CBG < 70:    Answer:   Implement Hypoglycemia Standing Orders and refer to Hypoglycemia Standing Orders sidebar report    Order Specific Question:   CBG 70 - 120:    Answer:   0 units    Order Specific Question:   CBG 121 - 150:    Answer:   0 units    Order Specific Question:  CBG 151 - 200:    Answer:   0 units    Order Specific Question:   CBG 201 - 250:    Answer:   2 units    Order Specific Question:   CBG 251 - 300:    Answer:   3 units    Order Specific Question:   CBG 301 - 350:    Answer:   4 units    Order Specific Question:   CBG 351 - 400:    Answer:   5 units    Order Specific Question:   CBG > 400    Answer:   call MD and obtain STAT lab verification   traMADol (ULTRAM) 50 MG tablet    Sig: Take 1 tablet (50 mg total) by mouth 2 (two) times daily as needed.   carvedilol (COREG) 3.125 MG tablet    Sig: Take 2 tablets (6.25 mg total) by mouth 2 (two) times daily with a meal.    I have reviewed the patients home medicines and have made adjustments as needed  Problem List / ED Course: Problem List Items  Addressed This Visit   None Visit Diagnoses     Vertigo    -  Primary   Transient alteration of awareness                       This note was created using dictation software, which may contain spelling or grammatical errors.    Loetta Rough, MD 10/12/23 0300

## 2023-10-08 NOTE — ED Triage Notes (Addendum)
Pt went for a walk after 5 mins felt nauseated and vomited 3-4 times and got dizzy. Pt had a brief period of AMS approx 30 mins. Pt states had some chest tightness. Pt LKW 1200 today

## 2023-10-08 NOTE — Consult Note (Signed)
NEUROLOGY CONSULT NOTE   Date of service: October 08, 2023 Patient Name: Phillip Tate MRN:  [id number] DOB:  December 27, 1956 Chief Complaint: "Nausea, vomiting, confusion" Requesting Provider: John Giovanni, MD  History of Present Illness  Phillip Tate is a 67 year old right-handed man with a past medical history significant for diabetes complicated by neuropathy, hypertension, hyperlipidemia, coronary artery disease, lumbar degenerative disc disease, rectal adenocarcinoma stage III s/p chemo and resection, recurrent small bowel obstructions, prior episodes of BPPV  He reports that he had been feeling slightly drowsy/dizzy for the past 2 days which he attributed to potentially too much blood pressure medication and therefore self reduced his Coreg from 12.5 mg twice daily to 6.25 mg twice daily.  He was taking his usual approximately 1 mile walk this morning at 11 AM but within walking 1 block he felt nauseated, hot, sweaty and generally unwell and so returned quickly to the house.  His wife and daughter were surprised to see him back so quickly and shortly after returning he did have an episode of emesis.  He thought this might be BPPV although he was not having dizziness and he did not have any symptoms when he tried home Epley maneuver.  However he did also take meclizine.  He was conversing normally with his family for about an hour and then got up to use the bathroom.  On ambulation he again started to feel nauseated and dizzy but also had a "strange sensation" in his abdomen that is difficult for him to describe.  He had a second bout of rather prolonged emesis.    After the second bout of emesis he had some confusion and is amnestic to this confusion.  For about 10 minutes he did not understand why he had an ostomy bag, which he has had for the past 2 years.  He is also confused about recent events such as family member hospitalization etc.  His memory gradually improved over  the course of the next hour and he is now felt to be at his baseline  He has never had concerns for confusion or dizziness before.  No seizure risk factors (normal birth and development, no meningitis/encephalitis, no TBI, no family history of seizures, no prior history of seizure)   LKW: 11 AM Modified rankin score: 0-Completely asymptomatic and back to baseline post- stroke IV Thrombolysis: No given back to baseline EVT: No given back to baseline   ROS  Comprehensive ROS performed and pertinent positives documented in HPI   Past History   Past Medical History:  Diagnosis Date   Coronary artery disease    DDD (degenerative disc disease), lumbar    Diabetes mellitus without complication (HCC)    Dyslipidemia    History of rectal cancer    dx 2006 --  Stage III, T3  N1  M0,  s/p  chemoradiation (07-10-2005 to 09-05-2005) and anterior colon resection 11-04-2006   History of shingles    Hyperlipidemia    Hypertension    Iron deficiency anemia    intolerent PO iron--  gets IV iron,  last one 03-12-2015 ([facility])   Organic impotence    Rectal cancer (HCC)    adenocarcinoma stage lll    Past Surgical History:  Procedure Laterality Date   ANAL RECTAL MANOMETRY N/A 09/04/2021   Procedure: ANO RECTAL MANOMETRY;  Surgeon: Napoleon Form, MD;  Location: [facility];  Service: Endoscopy;  Laterality: N/A;   ANTERIOR COLON RESECTION W/ COLOSTOMY  11/04/2006   CATARACT EXTRACTION  W/ INTRAOCULAR LENS IMPLANT Right sept 2015  &  feb 2016   COLONOSCOPY WITH ESOPHAGOGASTRODUODENOSCOPY (EGD)  last one 03-07-2010   COLONOSCOPY WITH PROPOFOL N/A 10/23/2015   Procedure: COLONOSCOPY WITH PROPOFOL;  Surgeon: Charolett Bumpers, MD;  Location: [facility];  Service: Endoscopy;  Laterality: N/A;   COLOSTOMY TAKEDOWN  05/2006   EXAMINATION UNDER ANESTHESIA N/A 04/23/2015   Procedure: EXAM UNDER ANESTHESIA, MANIPULATION OF PENILE PROSTHESIS PUMP;  Surgeon: Ihor Gully, MD;  Location: [facility]  [place];  Service: Urology;  Laterality: N/A;   HERNIA REPAIR     PENILE PROSTHESIS PLACEMENT  10/29/2009   PORT A CATH PLACEMENT  07/23/2005   prolapsed hemorrhoid      Family History: Family History  Problem Relation Age of Onset   Hypertension Father    Diabetes Mother    Heart attack Neg Hx    Colon cancer Neg Hx    Esophageal cancer Neg Hx    Rectal cancer Neg Hx    Stomach cancer Neg Hx     Social History  reports that he has never smoked. He has never used smokeless tobacco. He reports current alcohol use. He reports that he does not use drugs.  Allergies  Allergen Reactions   Cardizem Cd [Diltiazem Hcl Er Beads] Swelling   Pork-Derived Products     THIS IS A RELIGIOUS PREFERENCE   Toprol Xl [Metoprolol] Other (See Comments)    headache   Tricor [Fenofibrate] Other (See Comments)    Constipation     Medications  No current facility-administered medications for this encounter.  Current Outpatient Medications:    oxyCODONE (OXY IR/ROXICODONE) 5 MG immediate release tablet, Take 5 mg by mouth every 4 (four) hours as needed., Disp: , Rfl:    traMADol (ULTRAM) 50 MG tablet, Take 50 mg by mouth 3 (three) times daily as needed., Disp: , Rfl:    acetaminophen (TYLENOL) 500 MG tablet, Take 500 mg by mouth every 6 (six) hours as needed for mild pain., Disp: , Rfl:    amLODipine (NORVASC) 5 MG tablet, Take 5 mg by mouth daily., Disp: , Rfl:    ascorbic acid (VITAMIN C) 500 MG tablet, Take 500 mg by mouth 2 (two) times daily., Disp: , Rfl:    carvedilol (COREG) 3.125 MG tablet, Take 8 tablets (25 mg total) by mouth 2 (two) times daily with a meal., Disp: 60 tablet, Rfl: 0   cholecalciferol (VITAMIN D3) 25 MCG (1000 UNIT) tablet, Take 1,000 Units by mouth daily., Disp: , Rfl:    empagliflozin (JARDIANCE) 25 MG TABS tablet, Take 25 mg by mouth daily., Disp: , Rfl:    Ferrous Sulfate (IRON PO), Take 1 tablet by mouth daily., Disp: , Rfl:    gabapentin (NEURONTIN)  300 MG capsule, Take 300 mg by mouth 2 (two) times daily., Disp: , Rfl: 0   insulin glargine (LANTUS) 100 UNIT/ML injection, Inject 0.2 mLs (20 Units total) into the skin at bedtime., Disp: 10 mL, Rfl: 11   lisinopril (ZESTRIL) 40 MG tablet, Take 1 tablet (40 mg total) by mouth daily., Disp: , Rfl:    metFORMIN (GLUCOPHAGE) 850 MG tablet, Take 850 mg by mouth 2 (two) times daily with a meal., Disp: , Rfl:    Multiple Vitamin (MULTIVITAMIN WITH MINERALS) TABS tablet, Take 1 tablet by mouth daily., Disp: , Rfl:    NOVOLOG FLEXPEN 100 UNIT/ML FlexPen, Inject 20-40 Units into the skin 3 (three) times daily with meals. Per sliding scale, Disp: ,  Rfl: 2   OZEMPIC, 0.25 OR 0.5 MG/DOSE, 2 MG/3ML SOPN, Inject 0.25 mg into the skin once a week., Disp: , Rfl:    pantoprazole (PROTONIX) 40 MG tablet, Take 1 tablet (40 mg total) by mouth 2 (two) times daily., Disp: 60 tablet, Rfl: 0   Polyethyl Glycol-Propyl Glycol (SYSTANE FREE OP), Place 1 drop into both eyes 3 (three) times daily., Disp: , Rfl:    polyethylene glycol (MIRALAX / GLYCOLAX) 17 g packet, Take 17 g by mouth daily as needed., Disp: , Rfl:    rosuvastatin (CRESTOR) 10 MG tablet, Take 1 tablet (10 mg total) by mouth daily. (Patient taking differently: Take 10 mg by mouth every evening.), Disp: 90 tablet, Rfl: 3   sertraline (ZOLOFT) 25 MG tablet, Take 25 mg by mouth daily., Disp: , Rfl:    tamsulosin (FLOMAX) 0.4 MG CAPS capsule, Take 0.4 mg by mouth at bedtime., Disp: , Rfl:    valACYclovir (VALTREX) 1000 MG tablet, Take 1,000 mg by mouth daily as needed (cold sores)., Disp: , Rfl:    zolpidem (AMBIEN) 10 MG tablet, Take 10 mg by mouth at bedtime as needed for sleep., Disp: , Rfl:   Vitals   Vitals:   10/08/23 1733 10/08/23 1745 10/08/23 2033 10/08/23 2034  BP: 117/74 125/72 125/70   Pulse: 72 73 66 70  Resp: 19 20 18 17   Temp: 98.1 F (36.7 C)     TempSrc: Oral     SpO2: 99% 100% 99% 99%  Weight:      Height:        Body mass index is  31.95 kg/m.  Physical Exam   Constitutional: Appears well-developed and well-nourished.  Psych: Affect appropriate to situation.  Eyes: No scleral injection.  HENT: No OP obstruction.  Head: Normocephalic.  Cardiovascular: Normal rate and regular rhythm.  Respiratory: Effort normal, non-labored breathing.  GI: Soft.  No distension. There is no tenderness.  Skin: Warm dry and intact visible skin  Neurologic Examination    Neuro: Mental Status: Patient is awake, alert, oriented to person, place, month, year, and situation. Patient is able to give a clear and coherent history. No signs of aphasia or neglect, some occasional mild word finding difficulty attributed to [place] as a second language Cranial Nerves: II: Visual Fields are full. Pupils are equal, round, and reactive to light.   III,IV, VI: EOMI without ptosis or diploplia.  V: Facial sensation is symmetric to light touch  VII: Facial movement is symmetric. VIII: hearing is intact to voice X: Uvula elevates symmetrically XI: Shoulder shrug is symmetric. XII: tongue is midline without atrophy or fasciculations.  Motor: 5/5 strength was present in all four extremities.  Sensory: Sensation is symmetric to light touch and temperature in the arms and legs Deep Tendon Reflexes: 2+ and symmetric in the biceps and patellae.  Plantars: Toes are downgoing bilaterally.  Cerebellar: FNF and HKS are intact bilaterally  NIHSS total 0  Labs  Sleeps 11 CBC:  Recent Labs  Lab 10/08/23 1605 10/08/23 1620  WBC 8.2  --   NEUTROABS 5.7  --   HGB 14.1 16.0  HCT 44.7 47.0  MCV 78.7*  --   PLT 262  --     Basic Metabolic Panel:  Lab Results  Component Value Date   NA 135 10/08/2023   K 4.8 10/08/2023   CO2 21 (L) 10/08/2023   GLUCOSE 185 (H) 10/08/2023   BUN 20 10/08/2023   CREATININE 0.80 10/08/2023   CALCIUM  9.1 10/08/2023   GFRNONAA >60 10/08/2023   GFRAA  03/03/2010    >60        The eGFR has been  calculated using the MDRD equation. This calculation has not been validated in all clinical situations. eGFR's persistently <60 mL/min signify possible Chronic Kidney Disease.   Lipid Panel: No results found for: "LDLCALC" HgbA1c:  Lab Results  Component Value Date   HGBA1C 7.0 (H) 12/10/2022   Urine Drug Screen:     Component Value Date/Time   LABOPIA NONE DETECTED 10/08/2023 1607   COCAINSCRNUR NONE DETECTED 10/08/2023 1607   LABBENZ NONE DETECTED 10/08/2023 1607   AMPHETMU NONE DETECTED 10/08/2023 1607   THCU NONE DETECTED 10/08/2023 1607   LABBARB NONE DETECTED 10/08/2023 1607    Alcohol Level     Component Value Date/Time   ETH <10 10/08/2023 1605   INR  Lab Results  Component Value Date   INR 1.0 10/08/2023   APTT  Lab Results  Component Value Date   APTT 25 10/08/2023   CT angio Head and Neck with contrast(Personally reviewed): 1. Negative CTA for large vessel occlusion or other emergent finding. 2. Focal atheromatous plaque with associated moderate short-segment stenosis within the proximal right V4 segment. 3. Mild atheromatous change about the carotid bifurcations and carotid siphons without hemodynamically significant stenosis. 4. Aortic Atherosclerosis (ICD10-I70.0).   MRI Brain(Personally reviewed): No evidence of acute intracranial abnormality.     Impression   Phillip Tate is a 67 y.o. male presenting with nausea, vomiting, confusion.  Certainly these are nonspecific symptoms however given his age, risk factors, posterior circulation TIA is on the differential.  Given the description of an odd abdominal sensation that was hard to describe prior to nausea/vomiting and gradually clearing confusion, complex partial seizure would also be on the differential neurologically.  Of course cardiac or GI issues may also be at play  Recommendations  #Possible TIA -Plavix 300 mg loading dose followed by 75 mg a daily for 21-day course -A1c,  lipid panel -Echocardiogram -Telemetry -Stroke team will follow in consultation  #Less likely focal seizure with impaired awareness -EEG -Discussed with patient to consider tapering Ambien on an outpatient basis  Appreciate additional medical workup by primary team  ______________________________________________________________________    Nunzio Cory MD-PhD [facility] [phone]

## 2023-10-09 ENCOUNTER — Observation Stay (HOSPITAL_COMMUNITY): Payer: Medicare HMO

## 2023-10-09 DIAGNOSIS — R42 Dizziness and giddiness: Secondary | ICD-10-CM | POA: Diagnosis not present

## 2023-10-09 DIAGNOSIS — R404 Transient alteration of awareness: Secondary | ICD-10-CM | POA: Diagnosis not present

## 2023-10-09 DIAGNOSIS — R569 Unspecified convulsions: Secondary | ICD-10-CM | POA: Diagnosis not present

## 2023-10-09 LAB — CBG MONITORING, ED
Glucose-Capillary: 135 mg/dL — ABNORMAL HIGH (ref 70–99)
Glucose-Capillary: 136 mg/dL — ABNORMAL HIGH (ref 70–99)
Glucose-Capillary: 166 mg/dL — ABNORMAL HIGH (ref 70–99)

## 2023-10-09 LAB — LIPID PANEL
Cholesterol: 142 mg/dL (ref 0–200)
HDL: 34 mg/dL — ABNORMAL LOW (ref >40)
LDL Cholesterol: 75 mg/dL (ref 0–99)
Total CHOL/HDL Ratio: 4.2 {ratio}
Triglycerides: 165 mg/dL — ABNORMAL HIGH (ref <150)
VLDL: 33 mg/dL (ref 0–40)

## 2023-10-09 MED ORDER — TRAMADOL HCL 50 MG PO TABS: 50.0000 mg | ORAL_TABLET | Freq: Two times a day (BID) | ORAL | Status: AC | PRN

## 2023-10-09 MED ORDER — CARVEDILOL 3.125 MG PO TABS: 6.2500 mg | ORAL_TABLET | Freq: Two times a day (BID) | ORAL | Status: AC

## 2023-10-09 NOTE — Progress Notes (Addendum)
STROKE TEAM PROGRESS NOTE   BRIEF HPI [name] is a 67 y.o. male with history of BPPV, HTN, CAD, BPH, lichen planus, complicated urological history, and stage 3 colorectal cancer (s/p radiation, chemotherapy, and colostomy) presenting on 10/10 with acute on chronic dizziness, nausea and vomiting, and possibly syncopal episode.  On 10/10, he was out walking his normal 1 mile walk, and started to feel abdominal discomfort and a different sort of dizziness than he usually feels with his vertigo episodes. He had abdominal discomfort He was brought in by his wife for weakness, dizziness and a witnessed fall without head injury. Patient mentions that he has been modifying his blood pressure medications prescribed by his physician and that he had made changes in the days before his weakness and dizziness started.   SIGNIFICANT HOSPITAL EVENTS 10/10 - to ED, Trop 2x -, MR Brain, CT angio normal. UA showed glucose>500. Nonspecific EKG 10/11 -  lipid panel.  INTERIM HISTORY/SUBJECTIVE Pt reports that symptoms have resolved since coming into the ED. He no longer feels dizziness. Epley maneuvers correct his vertigo symptoms. No longer having abdominal discomfort, nausea, or sense of the room spinning. Pt has no lingering symptoms.    OBJECTIVE  CBC    Component Value Date/Time   WBC 8.2 10/08/2023 1605   RBC 5.68 10/08/2023 1605   HGB 16.0 10/08/2023 1620   HGB 12.4 (L) 09/18/2010 1208   HCT 47.0 10/08/2023 1620   HCT 37.8 (L) 09/18/2010 1208   PLT 262 10/08/2023 1605   PLT 249 09/18/2010 1208   MCV 78.7 (L) 10/08/2023 1605   MCV 79.0 (L) 09/18/2010 1208   MCH 24.8 (L) 10/08/2023 1605   MCHC 31.5 10/08/2023 1605   RDW 18.0 (H) 10/08/2023 1605   RDW 13.8 09/18/2010 1208   LYMPHSABS 2.0 10/08/2023 1605   LYMPHSABS 1.7 09/18/2010 1208   MONOABS 0.3 10/08/2023 1605   MONOABS 0.4 09/18/2010 1208   EOSABS 0.1 10/08/2023 1605   EOSABS 0.3 09/18/2010 1208   BASOSABS 0.1  10/08/2023 1605   BASOSABS 0.0 09/18/2010 1208    BMET    Component Value Date/Time   NA 135 10/08/2023 1620   K 4.8 10/08/2023 1620   CL 104 10/08/2023 1620   CO2 21 (L) 10/08/2023 1605   GLUCOSE 185 (H) 10/08/2023 1620   BUN 20 10/08/2023 1620   CREATININE 0.80 10/08/2023 1620   CALCIUM 9.1 10/08/2023 1605   GFRNONAA >60 10/08/2023 1605    IMAGING past 24 hours CT ANGIO HEAD NECK W WO CM  Result Date: 10/08/2023 CLINICAL DATA:  Initial evaluation for neuro deficit, stroke. EXAM: CT ANGIOGRAPHY HEAD AND NECK WITH AND WITHOUT CONTRAST TECHNIQUE: Multidetector CT imaging of the head and neck was performed using the standard protocol during bolus administration of intravenous contrast. Multiplanar CT image reconstructions and MIPs were obtained to evaluate the vascular anatomy. Carotid stenosis measurements (when applicable) are obtained utilizing NASCET criteria, using the distal internal carotid diameter as the denominator. RADIATION DOSE REDUCTION: This exam was performed according to the departmental dose-optimization program which includes automated exposure control, adjustment of the mA and/or kV according to patient size and/or use of iterative reconstruction technique. CONTRAST:  75mL OMNIPAQUE IOHEXOL 350 MG/ML SOLN COMPARISON:  None Available. FINDINGS: CT HEAD FINDINGS Brain: Mild atrophy with chronic small vessel ischemic disease. No acute intracranial hemorrhage. No acute large vessel territory infarct. No mass lesion or midline shift. No hydrocephalus or extra-axial fluid collection. Vascular: No abnormal hyperdense vessel. Skull:  Scalp soft tissues and calvarium demonstrate no acute finding. Sinuses/Orbits: Globes orbital soft tissues within normal limits. Paranasal sinuses are largely clear. No mastoid effusion. Other: None. Review of the MIP images confirms the above findings CTA NECK FINDINGS Aortic arch: Visualized arch within normal limits for caliber with standard branch  pattern. Mild aortic atherosclerosis. No stenosis. Right carotid system: Right common and internal carotid arteries are patent without dissection. Mild atheromatous change about the right carotid bulb without hemodynamically significant greater than 50% stenosis. Left carotid system: Left common and internal carotid arteries are patent without dissection. Mild atheromatous change about the left carotid bulb without hemodynamically significant greater than 50% stenosis. Vertebral arteries: No evidence of dissection, stenosis (50% or greater), or occlusion. Skeleton: No discrete or worrisome osseous lesions. Moderately advanced spondylosis throughout the visualized cervicothoracic spine. Other neck: No other acute finding. Upper chest: No other acute finding. Review of the MIP images confirms the above findings CTA HEAD FINDINGS Anterior circulation: Mild atheromatous change about the carotid siphons without stenosis. A1 segments, anterior communicating artery complex common anterior cerebral arteries patent without stenosis. No M1 stenosis or occlusion. Distal MCA branches perfused and symmetric. Posterior circulation: Dominant left V4 segment patent without stenosis. Focal atheromatous plaque with associated moderate short-segment stenosis noted within the proximal right V4 segment (series 11, image 149). Both PICA patent. Basilar widely patent without stenosis. Superior cerebellar and posterior cerebral arteries widely patent bilaterally. Venous sinuses: Patent allowing for timing the contrast bolus. Anatomic variants: As above.  No aneurysm. Review of the MIP images confirms the above findings IMPRESSION: CT HEAD IMPRESSION: 1. No acute intracranial abnormality. 2. Mild atrophy with chronic small vessel ischemic disease. CTA HEAD AND NECK IMPRESSION: 1. Negative CTA for large vessel occlusion or other emergent finding. 2. Focal atheromatous plaque with associated moderate short-segment stenosis within the proximal  right V4 segment. 3. Mild atheromatous change about the carotid bifurcations and carotid siphons without hemodynamically significant stenosis. 4. Aortic Atherosclerosis (ICD10-I70.0). Electronically Signed   By: Rise Mu M.D.   On: 10/08/2023 20:59   MR BRAIN WO CONTRAST  Result Date: 10/08/2023 CLINICAL DATA:  Mental status change, unknown cause EXAM: MRI HEAD WITHOUT CONTRAST TECHNIQUE: Multiplanar, multiecho pulse sequences of the brain and surrounding structures were obtained without intravenous contrast. COMPARISON:  CT head 12/09/2022. FINDINGS: Brain: No acute infarction, hemorrhage, hydrocephalus, extra-axial collection or mass lesion. Mild to moderate for age scattered small T2/FLAIR hyperintensities within the white matter are nonspecific but compatible with chronic microvascular ischemic change. Vascular: Major arterial flow voids are maintained skull base. Skull and upper cervical spine: Normal marrow signal. Sinuses/Orbits: Negative. Other: No sizable mastoid effusions. IMPRESSION: No evidence of acute intracranial abnormality. Electronically Signed   By: Feliberto Harts M.D.   On: 10/08/2023 19:57    Vitals:   10/09/23 0750 10/09/23 0800 10/09/23 0815 10/09/23 0900  BP:  134/74    Pulse:  78 75   Resp: 20  19   Temp:    98.2 F (36.8 C)  TempSrc:    Oral  SpO2:  100% 99%   Weight:      Height:       Lipid Panel     Component Value Date/Time   CHOL 142 10/09/2023 0411   TRIG 165 (H) 10/09/2023 0411   HDL 34 (L) 10/09/2023 0411   CHOLHDL 4.2 10/09/2023 0411   VLDL 33 10/09/2023 0411   LDLCALC 75 10/09/2023 0411      PHYSICAL EXAM General:  Alert,  well-nourished, well-developed patient in no acute distress Psych:  Mood and affect appropriate for situation CV: Regular rate and rhythm on monitor Respiratory:  Regular, unlabored respirations on room air GI: Abdomen soft and nontender, colostomy bag in place   NEURO:  Mental Status: AA&Ox3, patient is  able to give clear and coherent history Speech/Language: speech is without dysarthria or aphasia.  Naming, repetition, fluency, and comprehension intact.  Cranial Nerves:  II: PERRL. Visual fields full. III, IV, VI: EOMI. Eyelids elevate symmetrically.  V: Sensation is intact to light touch but patient does report slight diminishing on R side of face.  VII: Face is symmetrical resting and smiling VIII: hearing intact to voice. IX, X: Palate elevates symmetrically. Phonation is normal.  ZO:[id number] shrug 5/5. XII: tongue is midline without fasciculations. Motor: 5/5 strength to all muscle groups tested.  Tone: is normal and bulk is normal Sensation- Intact to light touch bilaterally. Extinction absent to light touch to DSS.   Coordination: FTN intact bilaterally, HKS: no ataxia in BLE.No drift.  Gait- deferred (Benign neuro exam).  ASSESSMENT/PLAN  No focal neurological abnormality. CTA head & neck 10/10 CT HEAD IMPRESSION: 1. No acute intracranial abnormality. 2. Mild atrophy with chronic small vessel ischemic disease. CTA HEAD AND NECK IMPRESSION: 1. Negative CTA for large vessel occlusion or other emergent finding. 2. Focal atheromatous plaque with associated moderate short-segment stenosis within the proximal right V4 segment. 3. Mild atheromatous change about the carotid bifurcations and carotid siphons without hemodynamically significant stenosis. 4. Aortic Atherosclerosis (ICD10-I70.0). MRI  10/10 IMPRESSION: No evidence of acute intracranial abnormality.  LDL 75 HgbA1c 6.2 VTE prophylaxis - arixtra 2.5 aspirin 81 mg daily prior to admission, now on aspirin 81 mg daily alone. Therapy recommendations:  No follow up needed  Disposition:  Home, recommend follow-up with cardiology.   Hypertension Home meds:  norvasc 10, carvedilol 3.125, lisinopril 40 Stable Blood Pressure Goal: SBP less than 160   Hyperlipidemia Home meds:  rosuvastatin, resumed in hospital LDL 75,  goal < 70 Continue statin at discharge  Diabetes type II Controlled Home meds:  metformin 850 BID, ozempic 0.25mg /week, empagliflozin, insulin HgbA1c 6.2, goal < 7.0 CBGs SSI  Other Stroke Risk Factors Obesity, Body mass index is 31.95 kg/m., BMI >/= 30 associated with increased stroke risk, recommend weight loss, diet and exercise as appropriate  Coronary artery disease   Other Active Problems   Hospital day # 0  ATTENDING ATTESTATION:  Patient with history of vertigo.  He has dizziness when turning in the bed.  He had acute worsening of his dizziness while out for a walk today.  Initial concern was posterior circulation stroke.  MRI is negative.  The exam today is back to baseline and nonfocal.  NIH stroke scale 0 this is likely BPPV.  No need for DAPT.  Can take aspirin 81 mg.  No need for echocardiogram was canceled.  He developed his primary care outpatient.  Plan relayed to primary via secure chat.  Dr. Viviann Spare evaluated pt independently, reviewed imaging, chart, labs. Discussed and formulated plan with the Resident/APP. Changes were made to the note where appropriate. Please see APP/resident note above for details.   Total 36 minutes spent on counseling patient and coordinating care, writing notes and reviewing chart.    Shalona Harbour,MD    To contact Stroke Continuity provider, please refer to [email or web address]. After hours, contact General Neurology

## 2023-10-09 NOTE — ED Notes (Signed)
Lunch tray provided. 

## 2023-10-09 NOTE — Evaluation (Signed)
Physical Therapy Brief Evaluation and Discharge Note Patient Details Name: Phillip Tate MRN: [id number] DOB: 17-May-1956 Today's Date: 10/09/2023   History of Present Illness  Pt is 67 yo presenting with reports of strange feeling in the chest, diaphoresis, nausea and vomiting. Pt reports he then had vertigo. Family reports during this espisode pt was very confused. Symptoms resolved and pt reported to [facility]. PMH: CAD, DM II, Anemia, GERD, HTN, Hyperlipidemia, Rectal cancer with colostomy, recurrent SBO's, BPH.  Clinical Impression  Pt is presenting at baseline level of functioning. No deviations noted. Pt will be discharged at this time from skilled physical therapy services. Please re-consult if further needs arise.        PT Assessment Patient does not need any further PT services  Assistance Needed at Discharge  None    Equipment Recommendations None recommended by PT     Precautions/Restrictions Precautions Precautions: None Restrictions Weight Bearing Restrictions: No        Mobility  Bed Mobility Rolling: Independent Supine/Sidelying to sit: Independent Sit to supine/sidelying: Independent    Transfers Overall transfer level: Independent Equipment used: None      Ambulation/Gait Ambulation/Gait assistance: Independent Gait Distance (Feet): 350 Feet Assistive device: None Gait Pattern/deviations: WFL(Within Functional Limits) Gait Speed: Pace WFL    Home Activity Instructions    Stairs Stairs:  (Did not perform stairs. Pt demonstrates adequate strength with sit to stand and gait to perform stairs per home set up)             Balance Overall balance assessment: No apparent balance deficits (not formally assessed)          Pertinent Vitals/Pain   Pain Assessment Pain Assessment: No/denies pain     Home Living Family/patient expects to be discharged to:: Private residence Living Arrangements: Spouse/significant other Available Help  at Discharge: Available 24 hours/day;Family Home Environment: Stairs to enter;Rail - left;Rail - right  Stairs-Number of Steps: 3 Home Equipment: None        Prior Function Level of Independence: Independent Comments: working/driving    UE/LE Assessment   UE ROM/Strength/Tone/Coordination: WFL    LE ROM/Strength/Tone/Coordination: [name]      Communication   Communication Communication: No apparent difficulties     Cognition Overall Cognitive Status: Appears within functional limits for tasks assessed/performed         No Skilled PT Patient at baseline level of functioning;Patient is independent with all acitivity/mobility    AMPAC 6 Clicks Help needed turning from your back to your side while in a flat bed without using bedrails?: None Help needed moving from lying on your back to sitting on the side of a flat bed without using bedrails?: None Help needed moving to and from a bed to a chair (including a wheelchair)?: None Help needed standing up from a chair using your arms (e.g., wheelchair or bedside chair)?: None Help needed to walk in hospital room?: None Help needed climbing 3-5 steps with a railing? : None 6 Click Score: 24      End of Session Equipment Utilized During Treatment: Gait belt Activity Tolerance: Patient tolerated treatment well Patient left: in bed;with call bell/phone within reach Nurse Communication: Mobility status       Time: 2536-6440 PT Time Calculation (min) (ACUTE ONLY): 14 min  Charges:   PT Evaluation $PT Eval Low Complexity: 1 Low      Phillip Tate, DPT, CLT  Acute Rehabilitation Services Office: [phone] (Secure chat preferred)   Phillip Tate  10/09/2023,  12:11 PM

## 2023-10-09 NOTE — ED Notes (Signed)
Pt ambulated independently with normal gait & denied any vertigo.

## 2023-10-09 NOTE — Discharge Summary (Signed)
Physician Discharge Summary  Phillip Tate:[id number] DOB: [birth year] DOA: 10/08/2023  PCP: Georgann Housekeeper, MD  Admit date: 10/08/2023 Discharge date: 10/09/2023  Time spent: 50 minutes  Recommendations for Outpatient Follow-up:  Needs outpatient follow-up with vestibular rehab Please get screening labs in a week, no dosage changes of beta-blocker as well as discontinuation of amlodipine  Discharge Diagnoses:  MAIN problem for hospitalization   Severe vertigo with possible orthostasis Prior rectal cancer Ventricular arrhythmia status post ablation HTN  Please see below for itemized issues addressed in HOpsital- refer to other progress notes for clarity if needed  Discharge Condition: Good  Diet recommendation: Diabetic  Filed Weights   10/08/23 1604  Weight: 101 kg    History of present illness:  67 year old male--very active male [employer] works in town has lived here for close to 30 years originally from [place] Rectal cancer stage III status post colostomy-Has had multiple operations for partial small bowel obstructions- rectal cancer diagnosed in 2006 status post neoadjuvant chemoradiation + surgery--- at least for other surgeries between 2006 2007 2019 (hemorrhoidectomy) Ventricular arrhythmia ablation 09/26/2022  at [facility] electrophysiology Colon polyps in 2021 with oral lesions excised-2  tubular adenomas 2 lymphoid aggregates follows with [place] gastroenterology Dr. Orvan Falconer  complex urological history additionally with penile prosthesis and bilateral urinary catheters Chronic low back pain Lichen planus Prior episodic BPPV HTN CAD   Last admitted 05/21/2023 [facility] with abdominal pain decreased ostomy output found to have small bowel obstruction with transition point was managed conservatively and discharged ultimately 05/23/2023   Usually quite active at baseline walks about a mile Brought back to ED 10/08/2019 for nausea vomiting several times +  dizziness?  Altered mental status--this is in the setting of self reducing his Coreg because he was feeling dizzy Some noted amnesia chest pain?  Vertiginous symptoms associated with severe confusion which is not his baseline-did not remember several things about his ostomy and sister being hospitalized as well after a short walk developed emesis as well some amnesia additionally   Labs on arrival BUNs/creatinine 18/0.8 bicarb 21 LFTs normal troponins negative LDL 75 triglyceride 165, A1c 6.2 WBC 8.2 hemoglobin 14 platelet 262--UDS negative, UA >500   CT angio on neck no acute intracranial abnormality focal atheromatous plaque however with moderate short segment stenosis proximal R V4 segment   Neurology was consulted and because of amnesia TIA workup ensued   MRI was performed and was negative and it was felt that this was combination may be BPPV as well as some orthostasis On further questioning of the patient he tells me that 5 months ago he was seen by vestibular rehab and had habituation exercises performed went a couple of times and got better I have encouraged him to resume his meds at a lower dose of Coreg 3.125 twice daily I have discontinued his amlodipine He was recently also started on tramadol which can have weird effects and I have cut this back to twice daily only as needed and he should use plain old Tylenol for aches and pains From my perspective as he had no arrhythmias and no other concerns he is stable for discharge after clearance from neurology who cleared him this morning he does not need an echo and can be worked up in the outpatient setting should this recur Of course if he has significant symptoms he can come back to the emergency room I discussed all of these precautions with him clearly at discharge   Discharge Exam: Vitals:  10/09/23 1009 10/09/23 1051  BP:  134/74  Pulse: 84 84  Resp:  19  Temp: 98.2 F (36.8 C) 98.2 F (36.8 C)  SpO2:  99%    Subj on  day of d/c   Awake coherent  General Exam on discharge  EOMI NCAT no focal deficit CTAB no added sound finger-nose-finger intact slight decreased sensation to the right side but no dysdiadochokinesia Knee heel testing of normal Reflexes are 2/3 Abdomen is soft he has an ostomy No rash Psych euthymic  Discharge Instructions   Discharge Instructions     Diet - low sodium heart healthy   Complete by: As directed    Discharge instructions   Complete by: As directed    This hospital stay you were diagnosed with combination of probably your vertigo in addition to probable blood pressure changes related to your blood pressure meds The combination of these 2 things because you to have symptoms that were suggestive of a stroke although no stroke was found The neurologist saw you and felt that you were cleared for discharge and if you have had vestibular rehab in the past I would suggest you follow-up with them in the outpatient setting I would not use tramadol more than twice a day as needed and use only plain Tylenol going forward as tramadol can exhibit where it affects especially if you are not used to it-be careful with your gabapentin as well Please follow-up with your primary physician let them know that you were here in the hospital and that you were worked up-we will send a copy to note electronically-see him in about a week and get some basic labs and he will talk to you about that Good luck   Increase activity slowly   Complete by: As directed       Allergies as of 10/09/2023       Reactions   Cardizem Cd [diltiazem Hcl Er Beads] Swelling   Pork-derived Products    THIS IS A RELIGIOUS PREFERENCE   Toprol Xl [metoprolol] Other (See Comments)   headache   Tricor [fenofibrate] Other (See Comments)   Constipation        Medication List     STOP taking these medications    amLODipine 5 MG tablet Commonly known as: NORVASC   valACYclovir 1000 MG tablet Commonly  known as: VALTREX       TAKE these medications    acetaminophen 500 MG tablet Commonly known as: TYLENOL Take 500 mg by mouth every 6 (six) hours as needed for mild pain.   ascorbic acid 500 MG tablet Commonly known as: VITAMIN C Take 500 mg by mouth 2 (two) times daily.   carvedilol 3.125 MG tablet Commonly known as: COREG Take 2 tablets (6.25 mg total) by mouth 2 (two) times daily with a meal.   empagliflozin 25 MG Tabs tablet Commonly known as: JARDIANCE Take 25 mg by mouth daily.   gabapentin 300 MG capsule Commonly known as: NEURONTIN Take 300 mg by mouth 2 (two) times daily.   insulin glargine 100 UNIT/ML injection Commonly known as: LANTUS Inject 0.2 mLs (20 Units total) into the skin at bedtime. What changed: how much to take   IRON PO Take 1 tablet by mouth daily.   lisinopril 40 MG tablet Commonly known as: ZESTRIL Take 1 tablet (40 mg total) by mouth daily.   metFORMIN 850 MG tablet Commonly known as: GLUCOPHAGE Take 850 mg by mouth 2 (two) times daily with a meal.  NovoLOG FlexPen 100 UNIT/ML FlexPen Generic drug: insulin aspart Inject 20-40 Units into the skin 3 (three) times daily with meals. Per sliding scale   Ozempic (0.25 or 0.5 MG/DOSE) 2 MG/3ML Sopn Generic drug: Semaglutide(0.25 or 0.5MG /DOS) Inject 0.25 mg into the skin once a week.   pantoprazole 40 MG tablet Commonly known as: PROTONIX Take 1 tablet (40 mg total) by mouth 2 (two) times daily.   polyethylene glycol 17 g packet Commonly known as: MIRALAX / GLYCOLAX Take 17 g by mouth daily as needed.   rosuvastatin 10 MG tablet Commonly known as: CRESTOR Take 1 tablet (10 mg total) by mouth daily. What changed: when to take this   sertraline 25 MG tablet Commonly known as: ZOLOFT Take 25 mg by mouth daily.   SYSTANE FREE OP Place 1 drop into both eyes 3 (three) times daily.   tamsulosin 0.4 MG Caps capsule Commonly known as: FLOMAX Take 0.4 mg by mouth at bedtime.    traMADol 50 MG tablet Commonly known as: ULTRAM Take 1 tablet (50 mg total) by mouth 2 (two) times daily as needed. What changed: when to take this   Vitamin D 125 MCG (5000 UT) Caps Take 5,000 Units by mouth daily.   zolpidem 10 MG tablet Commonly known as: AMBIEN Take 10 mg by mouth at bedtime as needed for sleep.       Allergies  Allergen Reactions   Cardizem Cd [Diltiazem Hcl Er Beads] Swelling   Pork-Derived Products     THIS IS A RELIGIOUS PREFERENCE   Toprol Xl [Metoprolol] Other (See Comments)    headache   Tricor [Fenofibrate] Other (See Comments)    Constipation       The results of significant diagnostics from this hospitalization (including imaging, microbiology, ancillary and laboratory) are listed below for reference.    Significant Diagnostic Studies: EEG adult  Result Date: 11-02-2023 Charlsie Quest, MD     November 02, 2023 10:14 AM Patient Name: Phillip Tate MRN: [id number] Epilepsy Attending: Charlsie Quest Referring Physician/Provider: Gordy Councilman, MD Date: 11-02-2023 Duration: 26.18 mins Patient history: 67 y.o. male presenting with nausea, vomiting, confusion. EEG to evaluate for seizure Level of alertness: Awake, asleep AEDs during EEG study: None Technical aspects: This EEG study was done with scalp electrodes positioned according to the 10-20 International system of electrode placement. Electrical activity was reviewed with band pass filter of 1-70Hz , sensitivity of 7 uV/mm, display speed of 57mm/sec with a 60Hz  notched filter applied as appropriate. EEG data were recorded continuously and digitally stored.  Video monitoring was available and reviewed as appropriate. Description: The posterior dominant rhythm consists of 10 Hz activity of moderate voltage (25-35 uV) seen predominantly in posterior head regions, symmetric and reactive to eye opening and eye closing. Sleep was characterized by vertex waves, sleep spindles (12 to 14 Hz), maximal  frontocentral region. Hyperventilation and photic stimulation were not performed.   IMPRESSION: This study is within normal limits. No seizures or epileptiform discharges were seen throughout the recording. A normal interictal EEG does not exclude the diagnosis of epilepsy. Priyanka Annabelle Harman   CT ANGIO HEAD NECK W WO CM  Result Date: 10/08/2023 CLINICAL DATA:  Initial evaluation for neuro deficit, stroke. EXAM: CT ANGIOGRAPHY HEAD AND NECK WITH AND WITHOUT CONTRAST TECHNIQUE: Multidetector CT imaging of the head and neck was performed using the standard protocol during bolus administration of intravenous contrast. Multiplanar CT image reconstructions and MIPs were obtained to evaluate the vascular anatomy. Carotid stenosis  measurements (when applicable) are obtained utilizing NASCET criteria, using the distal internal carotid diameter as the denominator. RADIATION DOSE REDUCTION: This exam was performed according to the departmental dose-optimization program which includes automated exposure control, adjustment of the mA and/or kV according to patient size and/or use of iterative reconstruction technique. CONTRAST:  75mL OMNIPAQUE IOHEXOL 350 MG/ML SOLN COMPARISON:  None Available. FINDINGS: CT HEAD FINDINGS Brain: Mild atrophy with chronic small vessel ischemic disease. No acute intracranial hemorrhage. No acute large vessel territory infarct. No mass lesion or midline shift. No hydrocephalus or extra-axial fluid collection. Vascular: No abnormal hyperdense vessel. Skull: Scalp soft tissues and calvarium demonstrate no acute finding. Sinuses/Orbits: Globes orbital soft tissues within normal limits. Paranasal sinuses are largely clear. No mastoid effusion. Other: None. Review of the MIP images confirms the above findings CTA NECK FINDINGS Aortic arch: Visualized arch within normal limits for caliber with standard branch pattern. Mild aortic atherosclerosis. No stenosis. Right carotid system: Right common and  internal carotid arteries are patent without dissection. Mild atheromatous change about the right carotid bulb without hemodynamically significant greater than 50% stenosis. Left carotid system: Left common and internal carotid arteries are patent without dissection. Mild atheromatous change about the left carotid bulb without hemodynamically significant greater than 50% stenosis. Vertebral arteries: No evidence of dissection, stenosis (50% or greater), or occlusion. Skeleton: No discrete or worrisome osseous lesions. Moderately advanced spondylosis throughout the visualized cervicothoracic spine. Other neck: No other acute finding. Upper chest: No other acute finding. Review of the MIP images confirms the above findings CTA HEAD FINDINGS Anterior circulation: Mild atheromatous change about the carotid siphons without stenosis. A1 segments, anterior communicating artery complex common anterior cerebral arteries patent without stenosis. No M1 stenosis or occlusion. Distal MCA branches perfused and symmetric. Posterior circulation: Dominant left V4 segment patent without stenosis. Focal atheromatous plaque with associated moderate short-segment stenosis noted within the proximal right V4 segment (series 11, image 149). Both PICA patent. Basilar widely patent without stenosis. Superior cerebellar and posterior cerebral arteries widely patent bilaterally. Venous sinuses: Patent allowing for timing the contrast bolus. Anatomic variants: As above.  No aneurysm. Review of the MIP images confirms the above findings IMPRESSION: CT HEAD IMPRESSION: 1. No acute intracranial abnormality. 2. Mild atrophy with chronic small vessel ischemic disease. CTA HEAD AND NECK IMPRESSION: 1. Negative CTA for large vessel occlusion or other emergent finding. 2. Focal atheromatous plaque with associated moderate short-segment stenosis within the proximal right V4 segment. 3. Mild atheromatous change about the carotid bifurcations and carotid  siphons without hemodynamically significant stenosis. 4. Aortic Atherosclerosis (ICD10-I70.0). Electronically Signed   By: Rise Mu M.D.   On: 10/08/2023 20:59   MR BRAIN WO CONTRAST  Result Date: 10/08/2023 CLINICAL DATA:  Mental status change, unknown cause EXAM: MRI HEAD WITHOUT CONTRAST TECHNIQUE: Multiplanar, multiecho pulse sequences of the brain and surrounding structures were obtained without intravenous contrast. COMPARISON:  CT head 12/09/2022. FINDINGS: Brain: No acute infarction, hemorrhage, hydrocephalus, extra-axial collection or mass lesion. Mild to moderate for age scattered small T2/FLAIR hyperintensities within the white matter are nonspecific but compatible with chronic microvascular ischemic change. Vascular: Major arterial flow voids are maintained skull base. Skull and upper cervical spine: Normal marrow signal. Sinuses/Orbits: Negative. Other: No sizable mastoid effusions. IMPRESSION: No evidence of acute intracranial abnormality. Electronically Signed   By: Feliberto Harts M.D.   On: 10/08/2023 19:57    Microbiology: No results found for this or any previous visit (from the past 240 hour(s)).  Labs: Basic Metabolic Panel: Recent Labs  Lab 10/08/23 1605 10/08/23 1620  NA 136 135  K 4.9 4.8  CL 101 104  CO2 21*  --   GLUCOSE 184* 185*  BUN 18 20  CREATININE 0.83 0.80  CALCIUM 9.1  --    Liver Function Tests: Recent Labs  Lab 10/08/23 1605  AST 21  ALT 20  ALKPHOS 71  BILITOT 0.7  PROT 7.4  ALBUMIN 3.7   No results for input(s): "LIPASE", "AMYLASE" in the last 168 hours. No results for input(s): "AMMONIA" in the last 168 hours. CBC: Recent Labs  Lab 10/08/23 1605 10/08/23 1620  WBC 8.2  --   NEUTROABS 5.7  --   HGB 14.1 16.0  HCT 44.7 47.0  MCV 78.7*  --   PLT 262  --    Cardiac Enzymes: No results for input(s): "CKTOTAL", "CKMB", "CKMBINDEX", "TROPONINI" in the last 168 hours. BNP: BNP (last 3 results) No results for input(s):  "BNP" in the last 8760 hours.  ProBNP (last 3 results) No results for input(s): "PROBNP" in the last 8760 hours.  CBG: Recent Labs  Lab 10/09/23 0023 10/09/23 0831 10/09/23 1124  GLUCAP 135* 136* 166*       Signed:  Rhetta Mura MD   [facility] 10/09/2023, 12:09 PM

## 2023-10-09 NOTE — Procedures (Signed)
Patient Name: NAZAIAH NAVARRETE  MRN: [id number]  Epilepsy Attending: Charlsie Quest  Referring Physician/Provider: Gordy Councilman, MD  Date: 10/09/2023 Duration: 26.18 mins  Patient history: 67 y.o. male presenting with nausea, vomiting, confusion. EEG to evaluate for seizure  Level of alertness: Awake, asleep  AEDs during EEG study: None  Technical aspects: This EEG study was done with scalp electrodes positioned according to the 10-20 International system of electrode placement. Electrical activity was reviewed with band pass filter of 1-70Hz , sensitivity of 7 uV/mm, display speed of 8mm/sec with a 60Hz  notched filter applied as appropriate. EEG data were recorded continuously and digitally stored.  Video monitoring was available and reviewed as appropriate.  Description: The posterior dominant rhythm consists of 10 Hz activity of moderate voltage (25-35 uV) seen predominantly in posterior head regions, symmetric and reactive to eye opening and eye closing. Sleep was characterized by vertex waves, sleep spindles (12 to 14 Hz), maximal frontocentral region. Hyperventilation and photic stimulation were not performed.     IMPRESSION: This study is within normal limits. No seizures or epileptiform discharges were seen throughout the recording.  A normal interictal EEG does not exclude the diagnosis of epilepsy.  Danaya Geddis Annabelle Harman

## 2023-10-09 NOTE — Progress Notes (Signed)
OT Cancellation Note  Patient Details Name: Phillip Tate MRN: [id number] DOB: 14-May-1956   Cancelled Treatment:    Reason Eval/Treat Not Completed: OT screened, no needs identified, will sign off  Danitza Schoenfeldt D Zaron Zwiefelhofer 10/09/2023, 1:32 PM 10/09/2023  RP, OTR/L  Acute Rehabilitation Services  Office:  [phone]

## 2023-10-09 NOTE — Progress Notes (Signed)
EEG complete - results pending 

## 2023-10-09 NOTE — ED Notes (Signed)
Pt at bedside

## 2023-10-14 DIAGNOSIS — M5416 Radiculopathy, lumbar region: Secondary | ICD-10-CM | POA: Diagnosis not present

## 2023-10-16 ENCOUNTER — Ambulatory Visit: Payer: Medicare HMO | Attending: Internal Medicine

## 2023-10-16 DIAGNOSIS — R2681 Unsteadiness on feet: Secondary | ICD-10-CM | POA: Insufficient documentation

## 2023-10-16 DIAGNOSIS — R42 Dizziness and giddiness: Secondary | ICD-10-CM | POA: Insufficient documentation

## 2023-10-16 NOTE — Therapy (Signed)
OUTPATIENT PHYSICAL THERAPY VESTIBULAR EVALUATION     Patient Name: Phillip Tate MRN: [id number] DOB:September 09, 1956, 67 y.o., male Today's Date: 10/16/2023  END OF SESSION:  PT End of Session - 10/16/23 1527     Visit Number 1    Number of Visits 5    Date for PT Re-Evaluation 11/13/23    Authorization Type [employer] medicare    PT Start Time 1526    PT Stop Time 1549    PT Time Calculation (min) 23 min    Activity Tolerance Patient tolerated treatment well    Behavior During Therapy WFL for tasks assessed/performed             Past Medical History:  Diagnosis Date   Coronary artery disease    DDD (degenerative disc disease), lumbar    Diabetes mellitus without complication (HCC)    Dyslipidemia    History of rectal cancer    dx 2006 --  Stage III, T3  N1  M0,  s/p  chemoradiation (07-10-2005 to 09-05-2005) and anterior colon resection 11-04-2006   History of shingles    Hyperlipidemia    Hypertension    Iron deficiency anemia    intolerent PO iron--  gets IV iron,  last one 03-12-2015 ([facility])   Organic impotence    Rectal cancer (HCC)    adenocarcinoma stage lll   Past Surgical History:  Procedure Laterality Date   ANAL RECTAL MANOMETRY N/A 09/04/2021   Procedure: ANO RECTAL MANOMETRY;  Surgeon: Napoleon Form, MD;  Location: [facility];  Service: Endoscopy;  Laterality: N/A;   ANTERIOR COLON RESECTION W/ COLOSTOMY  11/04/2006   CATARACT EXTRACTION W/ INTRAOCULAR LENS IMPLANT Right sept 2015  &  feb 2016   COLONOSCOPY WITH ESOPHAGOGASTRODUODENOSCOPY (EGD)  last one 03-07-2010   COLONOSCOPY WITH PROPOFOL N/A 10/23/2015   Procedure: COLONOSCOPY WITH PROPOFOL;  Surgeon: Charolett Bumpers, MD;  Location: [facility];  Service: Endoscopy;  Laterality: N/A;   COLOSTOMY TAKEDOWN  05/2006   EXAMINATION UNDER ANESTHESIA N/A 04/23/2015   Procedure: EXAM UNDER ANESTHESIA, MANIPULATION OF PENILE PROSTHESIS PUMP;  Surgeon: Ihor Gully, MD;  Location: [facility];  Service: Urology;  Laterality: N/A;   HERNIA REPAIR     PENILE PROSTHESIS PLACEMENT  10/29/2009   PORT A CATH PLACEMENT  07/23/2005   prolapsed hemorrhoid     Patient Active Problem List   Diagnosis Date Noted   Dizziness 10/08/2023   Chest pain 10/08/2023   Nausea and vomiting 10/08/2023   H/O small bowel obstruction 06/24/2023   Bilateral recurrent inguinal hernia without obstruction or gangrene 06/05/2023   SBO (small bowel obstruction) (HCC) 05/21/2023   Bowel obstruction (HCC) 04/01/2023   Syncope 12/09/2022   Rib fracture 12/09/2022   Influenza 12/09/2022   Nonhealing nonsurgical wound 12/01/2022   Incisional hernia, without obstruction or gangrene    Irritant contact dermatitis associated with fecal stoma    Colostomy complication (HCC)    CAD (coronary artery disease) 06/22/2022   Frequent PVCs 05/07/2022   Tinnitus of left ear 05/23/2021   Sleep disorder 05/23/2021   Paresthesia 05/23/2021   Neuropathy 05/23/2021   Dysphagia 05/23/2021   Allergic rhinitis 05/23/2021   Rectal pain 03/05/2021   Polyneuropathy due to type 2 diabetes mellitus (HCC) 03/05/2021   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   Long term (current) use of insulin (HCC) 03/05/2021   Lichen planus 03/05/2021   Irregular bowel habits 03/05/2021   Iron deficiency anemia 03/05/2021   Incontinence of feces 03/05/2021  Hyperglycemia due to type 2 diabetes mellitus (HCC) 03/05/2021   History of rectal cancer with ostomy  03/05/2021   History of iron deficiency 03/05/2021   Gastroesophageal reflux disease 03/05/2021   Diabetic retinopathy associated with type 2 diabetes mellitus (HCC) 03/05/2021   Diabetic renal disease (HCC) 03/05/2021   Constipation 03/05/2021   Chronic kidney disease, stage 1 03/05/2021   Cervical disc disease 03/05/2021   Rectal mucosa prolapse 10/26/2018   Essential hypertension 09/18/2016   Hyperlipidemia 09/18/2016   Insulin dependent type 2 diabetes mellitus  (HCC) 10/11/2015   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 He was recently hospitalized for presumed vertigo, but possibly dehydration or an adverse medication reaction. He describes intermittent spinning when turning over in bed, sometimes R, sometimes L. Has not taken meclizine in last 24-48 hours.  Pt accompanied by: self  PERTINENT HISTORY: anemia, arthritis, colorectal CA, DM, HLD, HTN  PAIN:  Are you having pain? No  PRECAUTIONS: Fall   WEIGHT BEARING RESTRICTIONS: No  FALLS: Has patient fallen in last 6 months? Yes. Number of falls 1, right before going into hospital  PATIENT GOALS: to not be dizzy  OBJECTIVE:  Note: Objective measures were completed at Evaluation unless otherwise noted.  DIAGNOSTIC FINDINGS: 10/08/23 brain MRI IMPRESSION: No evidence of acute intracranial abnormality.  COGNITION: Overall cognitive status: Within functional limits for tasks assessed   SENSATION: WFL  POSTURE:  No Significant postural limitations  Cervical ROM:   WFL, pain free  STRENGTH: WFL  BED MOBILITY:  Independent, does reproduce dizziness   GAIT: Gait pattern: WFL Distance walked: clinic    PATIENT SURVEYS:  FOTO staff did not capture  VESTIBULAR ASSESSMENT:  GENERAL OBSERVATION: NAD, no AD   SYMPTOM BEHAVIOR:  Subjective history: see above  Non-Vestibular symptoms: nausea/vomiting  Type of dizziness: Spinning/Vertigo  Frequency: not very often   Duration: seconds  Aggravating factors: Induced by position change: rolling to the  right and rolling to the left and Induced by motion: bending down to the ground  Relieving factors: closing eyes, rest, and slow movements  Progression of symptoms: better  OCULOMOTOR EXAM:  Ocular Alignment: normal  Ocular ROM: No Limitations  Spontaneous Nystagmus: absent  Gaze-Induced Nystagmus: absent  Smooth Pursuits: intact  Saccades: intact   VESTIBULAR - OCULAR REFLEX:   Slow VOR: Normal  VOR Cancellation: Normal  Head-Impulse Test: HIT Right: negative HIT Left: negative   POSITIONAL TESTING: Right Dix-Hallpike: no nystagmus Left Dix-Hallpike: no nystagmus Right Roll Test: no nystagmus Left Roll Test: no nystagmus  MOTION SENSITIVITY:  Motion Sensitivity Quotient Intensity: 0 = none, 1 = Lightheaded, 2 = Mild, 3 = Moderate, 4 = Severe, 5 = Vomiting  Intensity  1. Sitting to supine 0  2. Supine to L side 0  3. Supine to R side 0  4. Supine to sitting 0  5. L Hallpike-Dix 0  6. Up from L  0  7. R Hallpike-Dix 0  8. Up from R  0  9. Sitting, head tipped to L knee   10. Head up from L knee   11. Sitting,  head tipped to R knee   12. Head up from R knee   13. Sitting head turns x5   14.Sitting head nods x5   15. In stance, 180 turn to L    16. In stance, 180 turn to R    VESTIBULAR TREATMENT:                                                                                                   N/a eval  PATIENT EDUCATION: Education details: PT POC, exam findings, BEFAST acronym Person educated: Patient Education method: Explanation and Handouts Education comprehension: verbalized understanding  HOME EXERCISE PROGRAM: To be provided GOALS: Goals reviewed with patient? Yes  SHORT TERM GOALS: = LTG based on PT POC length  LONG TERM GOALS: Target date: 11/13/23  Pt will be independent with final HEP for improved symptoms  Baseline: to be provided Goal status: INITIAL  2.  Patient will demonstrate (-) positional testing to indicate resolution of  BPPV  Baseline: to be retested PRN Goal status: INITIAL  ASSESSMENT:  CLINICAL IMPRESSION: Patient is a 67 y.o. male who was seen today for physical therapy evaluation and treatment for dizziness. He is familiar to this clinic. Patient presents back with complaints of recurrent vertigo, similar to his last episode. He was recently in the hospital for dizziness and AMS, but complaints not consistent with BPPV or "vertigo" of vestibular origin. He would benefit from skilled PT services to address recurrent BPPV.   OBJECTIVE IMPAIRMENTS: dizziness.   ACTIVITY LIMITATIONS: bending and bed mobility  PARTICIPATION LIMITATIONS: interpersonal relationship and community activity  PERSONAL FACTORS: Age, Past/current experiences, Time since onset of injury/illness/exacerbation, and 3+ comorbidities: see above  are also affecting patient's functional outcome.   REHAB POTENTIAL: Good  CLINICAL DECISION MAKING: Stable/uncomplicated  EVALUATION COMPLEXITY: Low   PLAN:  PT FREQUENCY: 1x/week  PT DURATION: 4 weeks  PLANNED INTERVENTIONS: 97164- PT Re-evaluation, 97110-Therapeutic exercises, 97530- Therapeutic activity, 97112- Neuromuscular re-education, 97535- Self Care, 11914- Manual therapy, [phone]- Gait training, [phone]- Orthotic Fit/training, [phone]- Canalith repositioning, Patient/Family education, Balance training, Stair training, and DME instructions  PLAN FOR NEXT SESSION: retest for BPPV PRN   Westley Foots, PT Westley Foots, PT, DPT, CBIS  10/16/2023, 3:57 PM

## 2023-10-23 DIAGNOSIS — G479 Sleep disorder, unspecified: Secondary | ICD-10-CM | POA: Diagnosis not present

## 2023-10-23 DIAGNOSIS — M79644 Pain in right finger(s): Secondary | ICD-10-CM | POA: Diagnosis not present

## 2023-10-23 DIAGNOSIS — Z23 Encounter for immunization: Secondary | ICD-10-CM | POA: Diagnosis not present

## 2023-10-23 DIAGNOSIS — I951 Orthostatic hypotension: Secondary | ICD-10-CM | POA: Diagnosis not present

## 2023-10-23 DIAGNOSIS — I2510 Atherosclerotic heart disease of native coronary artery without angina pectoris: Secondary | ICD-10-CM | POA: Diagnosis not present

## 2023-10-23 DIAGNOSIS — R42 Dizziness and giddiness: Secondary | ICD-10-CM | POA: Diagnosis not present

## 2023-10-23 DIAGNOSIS — I10 Essential (primary) hypertension: Secondary | ICD-10-CM | POA: Diagnosis not present

## 2023-10-28 DIAGNOSIS — H52221 Regular astigmatism, right eye: Secondary | ICD-10-CM | POA: Diagnosis not present

## 2023-10-28 DIAGNOSIS — H04213 Epiphora due to excess lacrimation, bilateral lacrimal glands: Secondary | ICD-10-CM | POA: Diagnosis not present

## 2023-10-28 DIAGNOSIS — H5212 Myopia, left eye: Secondary | ICD-10-CM | POA: Diagnosis not present

## 2023-10-28 DIAGNOSIS — H40013 Open angle with borderline findings, low risk, bilateral: Secondary | ICD-10-CM | POA: Diagnosis not present

## 2023-10-28 DIAGNOSIS — H1789 Other corneal scars and opacities: Secondary | ICD-10-CM | POA: Diagnosis not present

## 2023-10-29 ENCOUNTER — Encounter: Payer: Medicare HMO | Admitting: Physical Therapy

## 2023-10-30 DIAGNOSIS — Z933 Colostomy status: Secondary | ICD-10-CM | POA: Diagnosis not present

## 2023-11-03 DIAGNOSIS — M1812 Unilateral primary osteoarthritis of first carpometacarpal joint, left hand: Secondary | ICD-10-CM | POA: Diagnosis not present

## 2023-12-02 DIAGNOSIS — E291 Testicular hypofunction: Secondary | ICD-10-CM | POA: Diagnosis not present

## 2023-12-02 DIAGNOSIS — E1142 Type 2 diabetes mellitus with diabetic polyneuropathy: Secondary | ICD-10-CM | POA: Diagnosis not present

## 2023-12-02 DIAGNOSIS — Z794 Long term (current) use of insulin: Secondary | ICD-10-CM | POA: Diagnosis not present

## 2023-12-02 DIAGNOSIS — E1122 Type 2 diabetes mellitus with diabetic chronic kidney disease: Secondary | ICD-10-CM | POA: Diagnosis not present

## 2023-12-02 DIAGNOSIS — Z933 Colostomy status: Secondary | ICD-10-CM | POA: Diagnosis not present

## 2023-12-02 DIAGNOSIS — N181 Chronic kidney disease, stage 1: Secondary | ICD-10-CM | POA: Diagnosis not present

## 2024-01-05 DIAGNOSIS — E1122 Type 2 diabetes mellitus with diabetic chronic kidney disease: Secondary | ICD-10-CM | POA: Diagnosis not present

## 2024-01-05 DIAGNOSIS — K9400 Colostomy complication, unspecified: Secondary | ICD-10-CM | POA: Diagnosis not present

## 2024-01-05 DIAGNOSIS — G479 Sleep disorder, unspecified: Secondary | ICD-10-CM | POA: Diagnosis not present

## 2024-01-05 DIAGNOSIS — N181 Chronic kidney disease, stage 1: Secondary | ICD-10-CM | POA: Diagnosis not present

## 2024-01-05 DIAGNOSIS — Z85048 Personal history of other malignant neoplasm of rectum, rectosigmoid junction, and anus: Secondary | ICD-10-CM | POA: Diagnosis not present

## 2024-01-05 DIAGNOSIS — Z933 Colostomy status: Secondary | ICD-10-CM | POA: Diagnosis not present

## 2024-01-05 DIAGNOSIS — I10 Essential (primary) hypertension: Secondary | ICD-10-CM | POA: Diagnosis not present

## 2024-01-05 DIAGNOSIS — D509 Iron deficiency anemia, unspecified: Secondary | ICD-10-CM | POA: Diagnosis not present

## 2024-01-05 DIAGNOSIS — E782 Mixed hyperlipidemia: Secondary | ICD-10-CM | POA: Diagnosis not present

## 2024-01-12 ENCOUNTER — Ambulatory Visit: Payer: Medicare Other | Admitting: Gastroenterology

## 2024-01-12 ENCOUNTER — Other Ambulatory Visit (INDEPENDENT_AMBULATORY_CARE_PROVIDER_SITE_OTHER): Payer: Medicare Other

## 2024-01-12 ENCOUNTER — Encounter: Payer: Self-pay | Admitting: Gastroenterology

## 2024-01-12 VITALS — BP 126/74 | HR 76 | Ht 69.0 in | Wt 219.4 lb

## 2024-01-12 DIAGNOSIS — D649 Anemia, unspecified: Secondary | ICD-10-CM

## 2024-01-12 DIAGNOSIS — F1090 Alcohol use, unspecified, uncomplicated: Secondary | ICD-10-CM | POA: Diagnosis not present

## 2024-01-12 DIAGNOSIS — K219 Gastro-esophageal reflux disease without esophagitis: Secondary | ICD-10-CM

## 2024-01-12 DIAGNOSIS — R1013 Epigastric pain: Secondary | ICD-10-CM | POA: Diagnosis not present

## 2024-01-12 DIAGNOSIS — R110 Nausea: Secondary | ICD-10-CM

## 2024-01-12 DIAGNOSIS — Z85048 Personal history of other malignant neoplasm of rectum, rectosigmoid junction, and anus: Secondary | ICD-10-CM | POA: Diagnosis not present

## 2024-01-12 LAB — IBC + FERRITIN
Ferritin: 16.3 ng/mL — ABNORMAL LOW (ref 22.0–322.0)
Iron: 35 ug/dL — ABNORMAL LOW (ref 42–165)
Saturation Ratios: 7.2 % — ABNORMAL LOW (ref 20.0–50.0)
TIBC: 487.2 ug/dL — ABNORMAL HIGH (ref 250.0–450.0)
Transferrin: 348 mg/dL (ref 212.0–360.0)

## 2024-01-12 LAB — CBC WITH DIFFERENTIAL/PLATELET
Basophils Absolute: 0.1 10*3/uL (ref 0.0–0.1)
Basophils Relative: 0.9 % (ref 0.0–3.0)
Eosinophils Absolute: 0.2 10*3/uL (ref 0.0–0.7)
Eosinophils Relative: 3 % (ref 0.0–5.0)
HCT: 47.3 % (ref 39.0–52.0)
Hemoglobin: 14.7 g/dL (ref 13.0–17.0)
Lymphocytes Relative: 36.8 % (ref 12.0–46.0)
Lymphs Abs: 2.6 10*3/uL (ref 0.7–4.0)
MCHC: 31.1 g/dL (ref 30.0–36.0)
MCV: 74.6 fL — ABNORMAL LOW (ref 78.0–100.0)
Monocytes Absolute: 0.5 10*3/uL (ref 0.1–1.0)
Monocytes Relative: 6.5 % (ref 3.0–12.0)
Neutro Abs: 3.7 10*3/uL (ref 1.4–7.7)
Neutrophils Relative %: 52.8 % (ref 43.0–77.0)
Platelets: 281 10*3/uL (ref 150.0–400.0)
RBC: 6.34 Mil/uL — ABNORMAL HIGH (ref 4.22–5.81)
RDW: 17.8 % — ABNORMAL HIGH (ref 11.5–15.5)
WBC: 7 10*3/uL (ref 4.0–10.5)

## 2024-01-12 NOTE — Progress Notes (Signed)
 HPI :  68 year old male here for a follow-up visit.  Previously followed by Dr. Eda, last seen in April of last year.  He is a new patient to me.   Recall he was diagnosed with stage IIIA rectal adenocarcinoma 2006 s/p neoadjuvant chemoradio therapy, s/p intersphincteric proctectomy with colonic J-pouch and inverting loop ileostomy 10/2005, s/p loop ileostomy takedown 05/2006, hemorrhoidectomy 10/2018, and repair of prolapsed J pouch 11/2018.   He previously was followed by our office for fecal incontinence, leakage, suspected pouchitis.  He was treated with cholestyramine initially.  He underwent his last colonoscopy in 12/21 which showed decreased sphincter tone surgical anastomosis patent, 4 small polyps.  Anorectal manometry 09/04/21 showed very weak internal and external anal sphincters.   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  Trial of Imodium and Lialda  did not provide benefit.  Since he was last seen here he had a complicated course in [place]  when he was seen by the surgeons there.  In October 2023 he had open lysis of adhesions, excision of his J-pouch, revision of his colostomy, and repair of a parastomal hernia with mesh.  He was hospitalized there and eventually recovered.  Unfortunately he developed a recurrent small bowel obstruction in August 2024 and again underwent surgery in [place].  He had an ex lap with extensive lysis of adhesions, repair of a recurrent paracolostomy hernia.   He has recovered from that hospitalization and surgery.  He states he has been doing really well without any recurrent obstructive symptoms since that hospitalization.  He has also felt much better with removal of the J-pouch.  He is not having any leakage that bothers him and he is functioning much better with better quality of life with the colostomy at this time.  Denies any problems with his bowels.  No blood in the stool.  Ostomy  functioning normally.  He is in a good place right now in terms of his symptoms, he inquires about when he is next due for his colonoscopy.  He was placed on Protonix  a while ago for reflux, taking it dosed at 40 mg once daily and inquires if he needs to take this long-term.  He did have an EGD in April 2022 which showed no Barrett's esophagus.  He is taking Pepcid  in the morning.  We discussed options.  Of note, he also previously had an iron  deficiency/microcytosis while recovering from his last hospitalization, on iron         Endoscopic history:  - Prior endoscopy in 2011 with Dr. Gladis Louder with normal small bowel biopsies. Tubular adenoma with Dr. Colonoscopy 2016.  - Dr. Verlon with colonoscopy 2019 with a 3mm sigmoid polyp removed from the sigmoid. Colonic mucosa appeared normal.  Surveillance recommended in 5 years.  - Colonoscopy 11/28/20 showed decreased sphincter tone, anastomosis, and 4 small polyps, two of which were tubular adenomas and two were lymphoid aggregates.  EGD 03/2021 -  gastritis, duodenal erythema, no Barrett's  Anorectal manometry 09/04/21 show very weak internal and external anal sphincters.   Past Medical History:  Diagnosis Date   Coronary artery disease    DDD (degenerative disc disease), lumbar    Diabetes mellitus without complication (HCC)    Dyslipidemia    History of rectal cancer    dx 2006 --  Stage III, T3  N1  M0,  s/p  chemoradiation (07-10-2005 to 09-05-2005) and anterior colon resection 11-04-2006   History of shingles  Hyperlipidemia    Hypertension    Iron  deficiency anemia    intolerent PO iron --  gets IV iron ,  last one 03-12-2015 ([facility] )   Organic impotence    Rectal cancer (HCC)    adenocarcinoma stage lll     Past Surgical History:  Procedure Laterality Date   ANAL RECTAL MANOMETRY N/A 09/04/2021   Procedure: ANO RECTAL MANOMETRY;  Surgeon: Shila Gustav GAILS, MD;  Location: [facility];  Service: Endoscopy;   Laterality: N/A;   ANTERIOR COLON RESECTION W/ COLOSTOMY  11/04/2006   CATARACT EXTRACTION W/ INTRAOCULAR LENS IMPLANT Right sept 2015  &  feb 2016   COLONOSCOPY WITH ESOPHAGOGASTRODUODENOSCOPY (EGD)  last one 03-07-2010   COLONOSCOPY WITH PROPOFOL  N/A 10/23/2015   Procedure: COLONOSCOPY WITH PROPOFOL ;  Surgeon: Gladis MARLA Louder, MD;  Location: [facility];  Service: Endoscopy;  Laterality: N/A;   COLOSTOMY TAKEDOWN  05/2006   EXAMINATION UNDER ANESTHESIA N/A 04/23/2015   Procedure: EXAM UNDER ANESTHESIA, MANIPULATION OF PENILE PROSTHESIS PUMP;  Surgeon: Mark Ottelin, MD;  Location: [facility];  Service: Urology;  Laterality: N/A;   HERNIA REPAIR     PENILE PROSTHESIS PLACEMENT  10/29/2009   PORT A CATH PLACEMENT  07/23/2005   prolapsed hemorrhoid     Family History  Problem Relation Age of Onset   Hypertension Father    Diabetes Mother    Heart attack Neg Hx    Colon cancer Neg Hx    Esophageal cancer Neg Hx    Rectal cancer Neg Hx    Stomach cancer Neg Hx    Social History   Tobacco Use   Smoking status: Never   Smokeless tobacco: Never  Vaping Use   Vaping status: Never Used  Substance Use Topics   Alcohol use: Yes    Comment: Rarely   Drug use: No   Current Outpatient Medications  Medication Sig Dispense Refill   acetaminophen  (TYLENOL ) 500 MG tablet Take 500 mg by mouth every 6 (six) hours as needed for mild pain.     ascorbic acid (VITAMIN C) 500 MG tablet Take 500 mg by mouth 2 (two) times daily.     Cholecalciferol (VITAMIN D) 125 MCG (5000 UT) CAPS Take 5,000 Units by mouth daily.     empagliflozin  (JARDIANCE ) 25 MG TABS tablet Take 25 mg by mouth daily.     eszopiclone (LUNESTA) 2 MG TABS tablet Take 2 mg by mouth at bedtime.     Ferrous Sulfate (IRON  PO) Take 1 tablet by mouth daily.     gabapentin  (NEURONTIN ) 300 MG capsule Take 300 mg by mouth 2 (two) times daily.  0   insulin  glargine (LANTUS ) 100 UNIT/ML injection Inject 0.2 mLs (20 Units  total) into the skin at bedtime. (Patient taking differently: Inject 10 Units into the skin at bedtime.) 10 mL 11   lisinopril  (ZESTRIL ) 40 MG tablet Take 1 tablet (40 mg total) by mouth daily.     metFORMIN  (GLUCOPHAGE ) 850 MG tablet Take 850 mg by mouth 2 (two) times daily with a meal.     MOUNJARO 5 MG/0.5ML Pen Inject 5 mg into the skin once a week.     NOVOLOG  FLEXPEN 100 UNIT/ML FlexPen Inject 20-40 Units into the skin 3 (three) times daily with meals. Per sliding scale  2   pantoprazole  (PROTONIX ) 40 MG tablet Take 1 tablet (40 mg total) by mouth 2 (two) times daily. 60 tablet 0   Polyethyl Glycol-Propyl Glycol (SYSTANE FREE OP) Place 1 drop into  both eyes 3 (three) times daily.     polyethylene glycol (MIRALAX  / GLYCOLAX ) 17 g packet Take 17 g by mouth daily as needed.     rosuvastatin  (CRESTOR ) 10 MG tablet Take 1 tablet (10 mg total) by mouth daily. (Patient taking differently: Take 10 mg by mouth every evening.) 90 tablet 3   sertraline  (ZOLOFT ) 25 MG tablet Take 25 mg by mouth daily.     tamsulosin  (FLOMAX ) 0.4 MG CAPS capsule Take 0.4 mg by mouth at bedtime.     traMADol  (ULTRAM ) 50 MG tablet Take 1 tablet (50 mg total) by mouth 2 (two) times daily as needed.     zolpidem  (AMBIEN ) 10 MG tablet Take 10 mg by mouth at bedtime as needed for sleep.     carvedilol  (COREG ) 3.125 MG tablet Take 2 tablets (6.25 mg total) by mouth 2 (two) times daily with a meal.     ondansetron  (ZOFRAN ) 4 MG tablet Take 4 mg by mouth as needed. (Patient not taking: Reported on 01/12/2024)     No current facility-administered medications for this visit.   Allergies  Allergen Reactions   Cardizem Cd [Diltiazem Hcl Er Beads] Swelling   Pork-Derived Products     THIS IS A RELIGIOUS PREFERENCE   Toprol Xl [Metoprolol] Other (See Comments)    headache   Tricor [Fenofibrate] Other (See Comments)    Constipation      Review of Systems: All systems reviewed and negative except where noted in HPI.      Physical Exam: BP 126/74   Pulse 76   Ht 5' 9 (1.753 m)   Wt 219 lb 6 oz (99.5 kg)   SpO2 95%   BMI 32.40 kg/m  Constitutional: Pleasant,well-developed, male in no acute distress. Abdominal: Soft, nondistended, nontender. Ostomy in left abdomen. Surgical scars noted. There are no masses palpable.  Extremities: no edema Neurological: Alert and oriented to person place and time. Skin: Skin is warm and dry. No rashes noted. Psychiatric: Normal mood and affect. Behavior is normal.   ASSESSMENT: 68 y.o. male here for assessment of the following  1. History of rectal cancer with ostomy    2. Anemia, unspecified type   3. Gastroesophageal reflux disease, unspecified whether esophagitis present    As above, reviewed patient's extensive surgical history with him since he was last seen here.  Rectal cancer appears to be in remission, he has had significant issues postoperatively in recent years from pouchitis,  J-pouch dysfunction which led to ultimately excision of the J-pouch.  He has also had multiple parastomal hernia repairs and bowel obstructions.  Finally he appears to have recovered from this and clinically doing quite well since he has had the pouch removed.  He is due for his next colonoscopy in December 2026 assuming no persistent iron  deficiency anemia at this time.  He had mild iron  deficiency/anemia noted postoperatively, will check his labs to make sure stable at this time.  If he is iron  deficient may consider doing colonoscopy sooner.    Otherwise discussed his history of GERD, controlled on current dosing of Protonix .  He has no history of Barrett's.  Recommend he reduce Protonix  to 20 mg daily for 1 month and then can take as needed and perhaps switch to Pepcid  monotherapy if he tolerates.  He can follow-up with me as needed otherwise in the interim.  He agrees  PLAN: - doing well post surgery, s/p J pouch excision - lab today - CBC and TIBC / ferritin -  hopefully  normalized post surgery - reduce protonix  to 20mg  / day and then wean to pepcid  monotherapy - recall colonoscopy 11/2025 but may do sooner if IDA  Marcey Naval, MD [facility]

## 2024-01-12 NOTE — Patient Instructions (Signed)
 Please go to the lab in the basement of our building to have lab work done as you leave today. Hit B for basement when you get on the elevator.  When the doors open the lab is on your left.  We will call you with the results. Thank you.   Decrease Protonix  to 20 mg once daily.  You will be due for a recall colonoscopy in 11-2025. We will send you a reminder in the mail when it gets closer to that time.   Thank you for entrusting me with your care and for choosing [facility], Dr. Elspeth Naval   If your blood pressure at your visit was 140/90 or greater, please contact your primary care physician to follow up on this. ______________________________________________________  If you are age 25 or older, your body mass index should be between 23-30. Your Body mass index is 32.4 kg/m. If this is out of the aforementioned range listed, please consider follow up with your Primary Care Provider.  If you are age 47 or younger, your body mass index should be between 19-25. Your Body mass index is 32.4 kg/m. If this is out of the aformentioned range listed, please consider follow up with your Primary Care Provider.  ________________________________________________________  The [facility] providers would like to encourage you to use MYCHART to communicate with providers for non-urgent requests or questions.  Due to long hold times on the telephone, sending your provider a message by [facility] may be a faster and more efficient way to get a response.  Please allow 48 business hours for a response.  Please remember that this is for non-urgent requests.  _______________________________________________________  Due to recent changes in healthcare laws, you may see the results of your imaging and laboratory studies on MyChart before your provider has had a chance to review them.  We understand that in some cases there may be results that are confusing or concerning to you. Not all laboratory results  come back in the same time frame and the provider may be waiting for multiple results in order to interpret others.  Please give us  48 hours in order for your provider to thoroughly review all the results before contacting the office for clarification of your results.

## 2024-01-13 ENCOUNTER — Other Ambulatory Visit: Payer: Self-pay

## 2024-01-13 DIAGNOSIS — D509 Iron deficiency anemia, unspecified: Secondary | ICD-10-CM

## 2024-01-13 DIAGNOSIS — M1812 Unilateral primary osteoarthritis of first carpometacarpal joint, left hand: Secondary | ICD-10-CM | POA: Diagnosis not present

## 2024-01-13 MED ORDER — SUTAB 1479-225-188 MG PO TABS: ORAL_TABLET | ORAL | 0 refills | Status: DC

## 2024-01-14 DIAGNOSIS — I25118 Atherosclerotic heart disease of native coronary artery with other forms of angina pectoris: Secondary | ICD-10-CM | POA: Diagnosis not present

## 2024-01-14 DIAGNOSIS — I493 Ventricular premature depolarization: Secondary | ICD-10-CM | POA: Diagnosis not present

## 2024-01-14 DIAGNOSIS — I10 Essential (primary) hypertension: Secondary | ICD-10-CM | POA: Diagnosis not present

## 2024-02-22 ENCOUNTER — Encounter: Payer: Self-pay | Admitting: Gastroenterology

## 2024-03-01 ENCOUNTER — Encounter: Payer: Self-pay | Admitting: Gastroenterology

## 2024-03-01 DIAGNOSIS — Z794 Long term (current) use of insulin: Secondary | ICD-10-CM | POA: Diagnosis not present

## 2024-03-01 DIAGNOSIS — E1142 Type 2 diabetes mellitus with diabetic polyneuropathy: Secondary | ICD-10-CM | POA: Diagnosis not present

## 2024-03-01 DIAGNOSIS — E1122 Type 2 diabetes mellitus with diabetic chronic kidney disease: Secondary | ICD-10-CM | POA: Diagnosis not present

## 2024-03-01 DIAGNOSIS — N181 Chronic kidney disease, stage 1: Secondary | ICD-10-CM | POA: Diagnosis not present

## 2024-03-02 ENCOUNTER — Ambulatory Visit: Payer: Medicare Other | Admitting: Gastroenterology

## 2024-03-02 ENCOUNTER — Encounter: Payer: Self-pay | Admitting: Gastroenterology

## 2024-03-02 VITALS — BP 144/79 | HR 72 | Temp 97.5°F | Resp 12 | Ht 69.0 in | Wt 219.0 lb

## 2024-03-02 DIAGNOSIS — K319 Disease of stomach and duodenum, unspecified: Secondary | ICD-10-CM | POA: Diagnosis not present

## 2024-03-02 DIAGNOSIS — E785 Hyperlipidemia, unspecified: Secondary | ICD-10-CM | POA: Diagnosis not present

## 2024-03-02 DIAGNOSIS — Z85048 Personal history of other malignant neoplasm of rectum, rectosigmoid junction, and anus: Secondary | ICD-10-CM

## 2024-03-02 DIAGNOSIS — I10 Essential (primary) hypertension: Secondary | ICD-10-CM | POA: Diagnosis not present

## 2024-03-02 DIAGNOSIS — K644 Residual hemorrhoidal skin tags: Secondary | ICD-10-CM

## 2024-03-02 DIAGNOSIS — K2950 Unspecified chronic gastritis without bleeding: Secondary | ICD-10-CM

## 2024-03-02 DIAGNOSIS — D509 Iron deficiency anemia, unspecified: Secondary | ICD-10-CM

## 2024-03-02 MED ORDER — SUFLAVE 178.7 G PO SOLR: 1.0000 | Freq: Once | ORAL | 0 refills | Status: AC

## 2024-03-02 MED ORDER — SODIUM CHLORIDE 0.9 % IV SOLN: 500.0000 mL | Freq: Once | INTRAVENOUS | Status: DC

## 2024-03-02 NOTE — Progress Notes (Signed)
 Sedate, gd SR, tolerated procedure well, VSS, report to RN

## 2024-03-02 NOTE — Op Note (Signed)
 [facility] Patient Name: Phillip Tate Procedure Date: 03/02/2024 9:09 AM MRN: [id number] Endoscopist: Viviann Spare P. Adela Lank , MD, [id number] Age: 68 Referring MD:  Date of Birth: [birth year] Gender: Male Account #: [id number] Procedure:                Upper GI endoscopy Indications:              Iron deficiency anemia. Also with some nausea /                            early satiety while on Mounjaro Medicines:                Monitored Anesthesia Care Procedure:                Pre-Anesthesia Assessment:                           - Prior to the procedure, a History and Physical                            was performed, and patient medications and                            allergies were reviewed. The patient's tolerance of                            previous anesthesia was also reviewed. The risks                            and benefits of the procedure and the sedation                            options and risks were discussed with the patient.                            All questions were answered, and informed consent                            was obtained. Prior Anticoagulants: The patient has                            taken no anticoagulant or antiplatelet agents. ASA                            Grade Assessment: III - A patient with severe                            systemic disease. After reviewing the risks and                            benefits, the patient was deemed in satisfactory                            condition to undergo the procedure.  After obtaining informed consent, the endoscope was                            passed under direct vision. Throughout the                            procedure, the patient's blood pressure, pulse, and                            oxygen saturations were monitored continuously. The                            Olympus Scope [id number] was introduced through the                            mouth,  and advanced to the second part of duodenum.                            The upper GI endoscopy was accomplished without                            difficulty. The patient tolerated the procedure                            well. Scope In: Scope Out: Findings:                 Esophagogastric landmarks were identified: the                            Z-line was found at 40 cm, the gastroesophageal                            junction was found at 40 cm and the upper extent of                            the gastric folds was found at 40 cm from the                            incisors.                           The exam of the esophagus was otherwise normal.                           Patchy mildly erythematous mucosa was found in the                            gastric body. Biopsies were taken with a cold                            forceps for Helicobacter pylori testing.                           The exam  of the stomach was otherwise normal.                           Patchy mildly erythematous mucosa was found in the                            second portion of the duodenum. Biopsies were taken                            with a cold forceps for histology.                           The exam of the duodenum was otherwise normal. Complications:            No immediate complications. Estimated blood loss:                            Minimal. Estimated Blood Loss:     Estimated blood loss was minimal. Impression:               - Esophagogastric landmarks identified.                           - Normal esophagus otherwise                           - Erythematous mucosa in the gastric body. Biopsied.                           - Normal stomach otherwise.                           - Erythematous duodenopathy. Biopsied.                           - Normal duodenum otherwise.                           Normal gastric outlet - no overt pathology to cause                            symptoms, which may be due  to [facility]. Recommendation:           - Patient has a contact number available for                            emergencies. The signs and symptoms of potential                            delayed complications were discussed with the                            patient. Return to normal activities tomorrow.                            Written discharge instructions were provided to the  patient.                           - Resume previous diet.                           - Continue present medications.                           - Await pathology results. Viviann Spare P. Adela Lank, MD 03/02/2024 9:22:45 AM This report has been signed electronically.

## 2024-03-02 NOTE — Progress Notes (Signed)
 [facility] History and Physical   Primary Care Physician:  Georgann Housekeeper, MD   Reason for Procedure:   Iron deficiency anemia, history of rectal cancer  Plan:    EGD and colonoscopy     HPI: Phillip Tate is a 68 y.o. male  here for EGD and colonoscopy - see office note on 01/12/24 for details of his case. No interval changes. History of remote rectal cancer s/p surgery, J pouch, with subsequent J pouch resection and permanent colostomy. Also noted to have history of abdomial surgeries due to adhesions, parastomal hernia repair, and small bowel obstructions. Colonoscopy in 2019 and 2021 showed polyps. During his course has has developed IDA - EGD and colonoscopy to evaluate that.   Patient denies any bowel symptoms at this time.  Has had some nausea / early satiety on mounjaro. Otherwise feels well without any cardiopulmonary symptoms.   I have discussed risks / benefits of anesthesia and endoscopic procedure with Phillip Tate and they wish to proceed with the exams as outlined today.    Past Medical History:  Diagnosis Date   Atrial fibrillation (HCC)    had ablation- no issues since   Cataract    Coronary artery disease    DDD (degenerative disc disease), lumbar    Diabetes mellitus without complication (HCC)    Dyslipidemia    History of rectal cancer    dx 2006 --  Stage III, T3  N1  M0,  s/p  chemoradiation (07-10-2005 to 09-05-2005) and anterior colon resection 11-04-2006   History of shingles    Hyperlipidemia    Hypertension    Iron deficiency anemia    intolerent PO iron--  gets IV iron,  last one 03-12-2015 (Inf[facility])   Organic impotence    Rectal cancer (HCC)    adenocarcinoma stage lll    Past Surgical History:  Procedure Laterality Date   ABLATION     for atrial fib 10-2022   ANAL RECTAL MANOMETRY N/A 09/04/2021   Procedure: ANO RECTAL MANOMETRY;  Surgeon: Napoleon Form, MD;  Location: [facility];  Service: Endoscopy;   Laterality: N/A;   ANTERIOR COLON RESECTION W/ COLOSTOMY  11/04/2006   CATARACT EXTRACTION W/ INTRAOCULAR LENS IMPLANT Right sept 2015  &  feb 2016   COLONOSCOPY     COLONOSCOPY WITH ESOPHAGOGASTRODUODENOSCOPY (EGD)  last one 03-07-2010   COLONOSCOPY WITH PROPOFOL N/A 10/23/2015   Procedure: COLONOSCOPY WITH PROPOFOL;  Surgeon: Charolett Bumpers, MD;  Location: [facility];  Service: Endoscopy;  Laterality: N/A;   COLOSTOMY TAKEDOWN  05/2006   EXAMINATION UNDER ANESTHESIA N/A 04/23/2015   Procedure: EXAM UNDER ANESTHESIA, MANIPULATION OF PENILE PROSTHESIS PUMP;  Surgeon: Ihor Gully, MD;  Location: [facility];  Service: Urology;  Laterality: N/A;   HERNIA REPAIR     PENILE PROSTHESIS PLACEMENT  10/29/2009   PORT A CATH PLACEMENT  07/23/2005   prolapsed hemorrhoid      Prior to Admission medications   Medication Sig Start Date End Date Taking? Authorizing Provider  acetaminophen (TYLENOL) 500 MG tablet Take 500 mg by mouth every 6 (six) hours as needed for mild pain.   Yes [provider]  amLODipine (NORVASC) 10 MG tablet Take 10 mg by mouth daily.   Yes [provider]  ascorbic acid (VITAMIN C) 500 MG tablet Take 500 mg by mouth 2 (two) times daily.   Yes [provider]  Cholecalciferol (VITAMIN D) 125 MCG (5000 UT) CAPS Take 5,000 Units by  mouth daily.   Yes [provider]  empagliflozin (JARDIANCE) 25 MG TABS tablet Take 25 mg by mouth daily.   Yes [provider]  eszopiclone (LUNESTA) 2 MG TABS tablet Take 2 mg by mouth at bedtime. 10/23/23  Yes [provider]  gabapentin (NEURONTIN) 300 MG capsule Take 300 mg by mouth 2 (two) times daily. 08/02/16  Yes [provider]  lisinopril (ZESTRIL) 40 MG tablet Take 1 tablet (40 mg total) by mouth daily. 01/22/23  Yes Marinda Elk, MD  metFORMIN (GLUCOPHAGE) 850 MG tablet Take 850 mg by mouth 2 (two) times daily with a meal.   Yes [provider]   pantoprazole (PROTONIX) 20 MG tablet Take 1 tablet (20 mg total) by mouth daily. 01/12/24  Yes Krissie Merrick, Willaim Rayas, MD  rosuvastatin (CRESTOR) 10 MG tablet Take 1 tablet (10 mg total) by mouth daily. Patient taking differently: Take 10 mg by mouth every evening. 09/18/16  Yes Corky Crafts, MD  sertraline (ZOLOFT) 25 MG tablet Take 25 mg by mouth daily. 06/18/22  Yes [provider]  zolpidem (AMBIEN) 10 MG tablet Take 10 mg by mouth at bedtime as needed for sleep. 02/02/23  Yes [provider]  carvedilol (COREG) 3.125 MG tablet Take 2 tablets (6.25 mg total) by mouth 2 (two) times daily with a meal. 10/09/23 11/08/23  Rhetta Mura, MD  Ferrous Sulfate (IRON PO) Take 1 tablet by mouth daily.    [provider]  insulin glargine (LANTUS) 100 UNIT/ML injection Inject 0.2 mLs (20 Units total) into the skin at bedtime. Patient taking differently: Inject 10 Units into the skin at bedtime. 05/23/23   Barnetta Chapel, MD  MOUNJARO 5 MG/0.5ML Pen Inject 5 mg into the skin once a week. 12/28/23   [provider]  NOVOLOG FLEXPEN 100 UNIT/ML FlexPen Inject 20-40 Units into the skin 3 (three) times daily with meals. Per sliding scale 07/18/16   [provider]  ondansetron (ZOFRAN) 4 MG tablet Take 4 mg by mouth as needed. Patient not taking: Reported on 03/02/2024 12/16/23   [provider]  Polyethyl Glycol-Propyl Glycol (SYSTANE FREE OP) Place 1 drop into both eyes 3 (three) times daily.    [provider]  polyethylene glycol (MIRALAX / GLYCOLAX) 17 g packet Take 17 g by mouth daily as needed.    [provider]  tamsulosin (FLOMAX) 0.4 MG CAPS capsule Take 0.4 mg by mouth at bedtime.    [provider]  traMADol (ULTRAM) 50 MG tablet Take 1 tablet (50 mg total) by mouth 2 (two) times daily as needed. 10/09/23   Rhetta Mura, MD    Current Outpatient Medications  Medication Sig Dispense Refill    acetaminophen (TYLENOL) 500 MG tablet Take 500 mg by mouth every 6 (six) hours as needed for mild pain.     amLODipine (NORVASC) 10 MG tablet Take 10 mg by mouth daily.     ascorbic acid (VITAMIN C) 500 MG tablet Take 500 mg by mouth 2 (two) times daily.     Cholecalciferol (VITAMIN D) 125 MCG (5000 UT) CAPS Take 5,000 Units by mouth daily.     empagliflozin (JARDIANCE) 25 MG TABS tablet Take 25 mg by mouth daily.     eszopiclone (LUNESTA) 2 MG TABS tablet Take 2 mg by mouth at bedtime.     gabapentin (NEURONTIN) 300 MG capsule Take 300 mg by mouth 2 (two) times daily.  0   lisinopril (ZESTRIL) 40 MG tablet  Take 1 tablet (40 mg total) by mouth daily.     metFORMIN (GLUCOPHAGE) 850 MG tablet Take 850 mg by mouth 2 (two) times daily with a meal.     pantoprazole (PROTONIX) 20 MG tablet Take 1 tablet (20 mg total) by mouth daily.     rosuvastatin (CRESTOR) 10 MG tablet Take 1 tablet (10 mg total) by mouth daily. (Patient taking differently: Take 10 mg by mouth every evening.) 90 tablet 3   sertraline (ZOLOFT) 25 MG tablet Take 25 mg by mouth daily.     zolpidem (AMBIEN) 10 MG tablet Take 10 mg by mouth at bedtime as needed for sleep.     carvedilol (COREG) 3.125 MG tablet Take 2 tablets (6.25 mg total) by mouth 2 (two) times daily with a meal.     Ferrous Sulfate (IRON PO) Take 1 tablet by mouth daily.     insulin glargine (LANTUS) 100 UNIT/ML injection Inject 0.2 mLs (20 Units total) into the skin at bedtime. (Patient taking differently: Inject 10 Units into the skin at bedtime.) 10 mL 11   MOUNJARO 5 MG/0.5ML Pen Inject 5 mg into the skin once a week.     NOVOLOG FLEXPEN 100 UNIT/ML FlexPen Inject 20-40 Units into the skin 3 (three) times daily with meals. Per sliding scale  2   ondansetron (ZOFRAN) 4 MG tablet Take 4 mg by mouth as needed. (Patient not taking: Reported on 03/02/2024)     Polyethyl Glycol-Propyl Glycol (SYSTANE FREE OP) Place 1 drop into both eyes 3 (three) times daily.      polyethylene glycol (MIRALAX / GLYCOLAX) 17 g packet Take 17 g by mouth daily as needed.     tamsulosin (FLOMAX) 0.4 MG CAPS capsule Take 0.4 mg by mouth at bedtime.     traMADol (ULTRAM) 50 MG tablet Take 1 tablet (50 mg total) by mouth 2 (two) times daily as needed.     Current Facility-Administered Medications  Medication Dose Route Frequency Provider Last Rate Last Admin   0.9 %  sodium chloride infusion  500 mL Intravenous Once Marvel Mcphillips, Willaim Rayas, MD        Allergies as of 03/02/2024 - Review Complete 03/02/2024  Allergen Reaction Noted   Cardizem cd [diltiazem hcl er beads] Swelling 07/04/2013   Pork-derived products Other (See Comments) 10/14/2022   Toprol xl [metoprolol] Other (See Comments) 07/04/2013   Tricor [fenofibrate] Other (See Comments) 07/04/2013    Family History  Problem Relation Age of Onset   Hypertension Father    Diabetes Mother    Heart attack Neg Hx    Colon cancer Neg Hx    Esophageal cancer Neg Hx    Rectal cancer Neg Hx    Stomach cancer Neg Hx     Social History   Socioeconomic History   Marital status: Married    Spouse name: Not on file   Number of children: 3   Years of education: Not on file   Highest education level: Not on file  Occupational History   Not on file  Tobacco Use   Smoking status: Never   Smokeless tobacco: Never  Vaping Use   Vaping status: Never Used  Substance and Sexual Activity   Alcohol use: Yes    Comment: Rarely   Drug use: No   Sexual activity: Not on file  Other Topics Concern   Not on file  Social History Narrative   Not on file   Social Drivers of Corporate investment banker  Strain: Not on file  Food Insecurity: No Food Insecurity (04/02/2023)   Hunger Vital Sign    Worried About Running Out of Food in the Last Year: Never true    Ran Out of Food in the Last Year: Never true  Transportation Needs: No Transportation Needs (04/02/2023)   PRAPARE - Administrator, Civil Service (Medical):  No    Lack of Transportation (Non-Medical): No  Physical Activity: Not on file  Stress: Not on file  Social Connections: Not on file  Intimate Partner Violence: Not At Risk (04/02/2023)   Humiliation, Afraid, Rape, and Kick questionnaire    Fear of Current or Ex-Partner: No    Emotionally Abused: No    Physically Abused: No    Sexually Abused: No    Review of Systems: All other review of systems negative except as mentioned in the HPI.  Physical Exam: Vital signs BP (!) 143/76   Pulse 73   Temp (!) 97.5 F (36.4 C)   Ht 5\' 9"  (1.753 m)   Wt 219 lb (99.3 kg)   SpO2 95%   BMI 32.34 kg/m   General:   Alert,  Well-developed, pleasant and cooperative in NAD Lungs:  Clear throughout to auscultation.   Heart:  Regular rate and rhythm Abdomen:  Soft, nontender and nondistended.  Colostomy left abdomen Neuro/Psych:  Alert and cooperative. Normal mood and affect. A and O x 3  Harlin Rain, MD [facility]

## 2024-03-02 NOTE — Op Note (Signed)
 [facility] Patient Name: Phillip Tate Procedure Date: 03/02/2024 9:08 AM MRN: [id number] Endoscopist: Viviann Spare P. Adela Lank , MD, [id number] Age: 68 Referring MD:  Date of Birth: [birth year] Gender: Male Account #: [id number] Procedure:                Colonoscopy Indications:              Iron deficiency anemia - history of rectal cancer                            with diverting colostomy. History of colon polyps.                            Of note, patient previously had J pouch with                            resection due to issues with the pouch, patient has                            had one episode of small amount of blood per rectum                            and asked me to evaluate with DRE. Medicines:                Monitored Anesthesia Care Procedure:                Pre-Anesthesia Assessment:                           - Prior to the procedure, a History and Physical                            was performed, and patient medications and                            allergies were reviewed. The patient's tolerance of                            previous anesthesia was also reviewed. The risks                            and benefits of the procedure and the sedation                            options and risks were discussed with the patient.                            All questions were answered, and informed consent                            was obtained. Prior Anticoagulants: The patient has                            taken no anticoagulant or antiplatelet agents. ASA  Grade Assessment: III - A patient with severe                            systemic disease. After reviewing the risks and                            benefits, the patient was deemed in satisfactory                            condition to undergo the procedure.                           After obtaining informed consent, the colonoscope                            was passed  under direct vision. Throughout the                            procedure, the patient's blood pressure, pulse, and                            oxygen saturations were monitored continuously. The                            CF [id number] #[id number] was introduced through the                            transverse colostomy and advanced to the the                            terminal ileum, with identification of the                            appendiceal orifice and IC valve. The colonoscopy                            was technically difficult and complex due to                            inadequate bowel prep. The patient tolerated the                            procedure well. The quality of the bowel                            preparation was inadequate. The terminal ileum, the                            ileocecal valve and the appendiceal orifice were                            photographed. Scope In: 9:26:27 AM Scope Out: 9:36:14 AM Scope Withdrawal Time: 0 hours 3 minutes 8 seconds  Total Procedure Duration: 0 hours 9 minutes  47 seconds  Findings:                 Hemorrhoids were found on perianal exam - no                            stigmata for bleeding noted.                           A large amount of semi-solid stool was found in the                            entire colon, interfering with visualization.                            Residual vegetable matter noted throughout -                            clogged the colonoscope multiple times, I could not                            obtain adequate views to clear the colon.                           The terminal ileum appeared normal.                           The exam was otherwise without abnormality of what                            was visualized. Complications:            No immediate complications. Estimated blood loss:                            None. Estimated Blood Loss:     Estimated blood loss: none. Impression:               -  Preparation of the colon was inadequate -                            residual vegetable matter throughout which could                            not be cleared..                           - Hemorrhoids found on perianal exam.                           - The examined portion of the ileum was normal.                           - The examination was otherwise normal of what was                            visualized.  No obvious pathology noted on the exam but                            visualization was inadequate due to the prep. Recommendation:           - Patient has a contact number available for                            emergencies. The signs and symptoms of potential                            delayed complications were discussed with the                            patient. Return to normal activities tomorrow.                            Written discharge instructions were provided to the                            patient.                           - Resume previous diet.                           - Continue present medications.                           - Repeat colonoscopy because the bowel preparation                            was suboptimal. Will discuss options with the                            patient Phillip Tate. Phillip Bowell, MD 03/02/2024 9:45:12 AM This report has been signed electronically.

## 2024-03-02 NOTE — Patient Instructions (Addendum)
 Colonoscopy to be repeated ( see dates & times below)   Handout on gastritis given to you today  Await pathology results on stomach biopsies done today   No pathology from colonoscopy    YOU HAD AN ENDOSCOPIC PROCEDURE TODAY AT THE [facility]:   Refer to the procedure report that was given to you for any specific questions about what was found during the examination.  If the procedure report does not answer your questions, please call your gastroenterologist to clarify.  If you requested that your care partner not be given the details of your procedure findings, then the procedure report has been included in a sealed envelope for you to review at your convenience later.  YOU SHOULD EXPECT: Some feelings of bloating in the abdomen. Passage of more gas than usual.  Walking can help get rid of the air that was put into your GI tract during the procedure and reduce the bloating. If you had a lower endoscopy (such as a colonoscopy or flexible sigmoidoscopy) you may notice spotting of blood in your stool or on the toilet paper. If you underwent a bowel prep for your procedure, you may not have a normal bowel movement for a few days.  Please Note:  You might notice some irritation and congestion in your nose or some drainage.  This is from the oxygen used during your procedure.  There is no need for concern and it should clear up in a day or so.  SYMPTOMS TO REPORT IMMEDIATELY:  Following lower endoscopy (colonoscopy or flexible sigmoidoscopy):  Excessive amounts of blood in the stool  Significant tenderness or worsening of abdominal pains  Swelling of the abdomen that is new, acute  Fever of 100F or higher  Following upper endoscopy (EGD)  Vomiting of blood or coffee ground material  New chest pain or pain under the shoulder blades  Painful or persistently difficult swallowing  New shortness of breath  Fever of 100F or higher  Black, tarry-looking stools  For urgent or  emergent issues, a gastroenterologist can be reached at any hour by calling [phone]. Do not use MyChart messaging for urgent concerns.    DIET:  We do recommend a small meal at first, but then you may proceed to your regular diet.  Drink plenty of fluids but you should avoid alcoholic beverages for 24 hours.  ACTIVITY:  You should plan to take it easy for the rest of today and you should NOT DRIVE or use heavy machinery until tomorrow (because of the sedation medicines used during the test).    FOLLOW UP: Our staff will call the number listed on your records the next business day following your procedure.  We will call around 7:15- 8:00 am to check on you and address any questions or concerns that you may have regarding the information given to you following your procedure. If we do not reach you, we will leave a message.     If any biopsies were taken you will be contacted by phone or by letter within the next 1-3 weeks.  Please call us at [phone] if you have not heard about the biopsies in 3 weeks.    SIGNATURES/CONFIDENTIALITY: You and/or your care partner have signed paperwork which will be entered into your electronic medical record.  These signatures attest to the fact that that the information above on your After Visit Summary has been reviewed and is understood.  Full responsibility of the confidentiality of this discharge  information lies with you and/or your care-partner.

## 2024-03-02 NOTE — Progress Notes (Signed)
 Procedure rescheduled for 04/20/24 d/t inadequate prep. New instructions printed and reviewed with pt for a 2 day prep with Suflave and Miralax. Pt verbalized understanding. Also sent instructions via MyChart.

## 2024-03-03 ENCOUNTER — Telehealth: Payer: Self-pay | Admitting: *Deleted

## 2024-03-03 NOTE — Telephone Encounter (Signed)
  Follow up Call-     03/02/2024    8:15 AM  Call back number  Post procedure Call Back phone  # [phone]  Permission to leave phone message Yes   No answer, left message on machine to call back if he has any issues

## 2024-03-04 LAB — SURGICAL PATHOLOGY

## 2024-03-10 ENCOUNTER — Encounter: Payer: Self-pay | Admitting: *Deleted

## 2024-03-10 ENCOUNTER — Other Ambulatory Visit: Payer: Self-pay | Admitting: *Deleted

## 2024-03-10 DIAGNOSIS — D509 Iron deficiency anemia, unspecified: Secondary | ICD-10-CM

## 2024-04-19 DIAGNOSIS — M5412 Radiculopathy, cervical region: Secondary | ICD-10-CM | POA: Diagnosis not present

## 2024-04-20 ENCOUNTER — Encounter: Payer: Self-pay | Admitting: Gastroenterology

## 2024-04-20 ENCOUNTER — Encounter: Admitting: Gastroenterology

## 2024-04-25 ENCOUNTER — Other Ambulatory Visit: Payer: Self-pay | Admitting: Internal Medicine

## 2024-04-25 ENCOUNTER — Encounter: Admitting: Gastroenterology

## 2024-04-25 DIAGNOSIS — N644 Mastodynia: Secondary | ICD-10-CM

## 2024-04-25 DIAGNOSIS — M5430 Sciatica, unspecified side: Secondary | ICD-10-CM | POA: Diagnosis not present

## 2024-04-25 DIAGNOSIS — E1142 Type 2 diabetes mellitus with diabetic polyneuropathy: Secondary | ICD-10-CM | POA: Diagnosis not present

## 2024-04-27 ENCOUNTER — Ambulatory Visit
Admission: RE | Admit: 2024-04-27 | Discharge: 2024-04-27 | Discharge status: Home / Self Care | Source: Ambulatory Visit | Attending: Internal Medicine | Admitting: Internal Medicine

## 2024-04-27 ENCOUNTER — Ambulatory Visit

## 2024-04-27 DIAGNOSIS — Z961 Presence of intraocular lens: Secondary | ICD-10-CM | POA: Diagnosis not present

## 2024-04-27 DIAGNOSIS — H1789 Other corneal scars and opacities: Secondary | ICD-10-CM | POA: Diagnosis not present

## 2024-04-27 DIAGNOSIS — N62 Hypertrophy of breast: Secondary | ICD-10-CM | POA: Diagnosis not present

## 2024-04-27 DIAGNOSIS — N644 Mastodynia: Secondary | ICD-10-CM | POA: Diagnosis not present

## 2024-04-27 DIAGNOSIS — H40013 Open angle with borderline findings, low risk, bilateral: Secondary | ICD-10-CM | POA: Diagnosis not present

## 2024-04-27 DIAGNOSIS — H04213 Epiphora due to excess lacrimation, bilateral lacrimal glands: Secondary | ICD-10-CM | POA: Diagnosis not present

## 2024-04-28 ENCOUNTER — Ambulatory Visit: Admitting: Gastroenterology

## 2024-04-28 ENCOUNTER — Other Ambulatory Visit (INDEPENDENT_AMBULATORY_CARE_PROVIDER_SITE_OTHER)

## 2024-04-28 ENCOUNTER — Encounter: Payer: Self-pay | Admitting: Gastroenterology

## 2024-04-28 VITALS — BP 139/73 | HR 69 | Temp 97.3°F | Resp 12 | Ht 69.0 in | Wt 219.0 lb

## 2024-04-28 DIAGNOSIS — E785 Hyperlipidemia, unspecified: Secondary | ICD-10-CM | POA: Diagnosis not present

## 2024-04-28 DIAGNOSIS — E119 Type 2 diabetes mellitus without complications: Secondary | ICD-10-CM | POA: Diagnosis not present

## 2024-04-28 DIAGNOSIS — D120 Benign neoplasm of cecum: Secondary | ICD-10-CM | POA: Diagnosis not present

## 2024-04-28 DIAGNOSIS — D509 Iron deficiency anemia, unspecified: Secondary | ICD-10-CM

## 2024-04-28 DIAGNOSIS — I10 Essential (primary) hypertension: Secondary | ICD-10-CM | POA: Diagnosis not present

## 2024-04-28 DIAGNOSIS — D123 Benign neoplasm of transverse colon: Secondary | ICD-10-CM

## 2024-04-28 DIAGNOSIS — K635 Polyp of colon: Secondary | ICD-10-CM | POA: Diagnosis not present

## 2024-04-28 DIAGNOSIS — Z85048 Personal history of other malignant neoplasm of rectum, rectosigmoid junction, and anus: Secondary | ICD-10-CM

## 2024-04-28 LAB — CBC WITH DIFFERENTIAL/PLATELET
Basophils Absolute: 0.1 10*3/uL (ref 0.0–0.1)
Basophils Relative: 0.8 % (ref 0.0–3.0)
Eosinophils Absolute: 0.1 10*3/uL (ref 0.0–0.7)
Eosinophils Relative: 1.7 % (ref 0.0–5.0)
HCT: 47 % (ref 39.0–52.0)
Hemoglobin: 14.9 g/dL (ref 13.0–17.0)
Lymphocytes Relative: 27.9 % (ref 12.0–46.0)
Lymphs Abs: 1.8 10*3/uL (ref 0.7–4.0)
MCHC: 31.7 g/dL (ref 30.0–36.0)
MCV: 77.4 fl — ABNORMAL LOW (ref 78.0–100.0)
Monocytes Absolute: 0.5 10*3/uL (ref 0.1–1.0)
Monocytes Relative: 7.9 % (ref 3.0–12.0)
Neutro Abs: 3.9 10*3/uL (ref 1.4–7.7)
Neutrophils Relative %: 61.7 % (ref 43.0–77.0)
Platelets: 238 10*3/uL (ref 150.0–400.0)
RBC: 6.07 Mil/uL — ABNORMAL HIGH (ref 4.22–5.81)
RDW: 18.1 % — ABNORMAL HIGH (ref 11.5–15.5)
WBC: 6.3 10*3/uL (ref 4.0–10.5)

## 2024-04-28 MED ORDER — SODIUM CHLORIDE 0.9 % IV SOLN: 500.0000 mL | Freq: Once | INTRAVENOUS | Status: DC

## 2024-04-28 NOTE — Op Note (Signed)
 [facility] Patient Name: Phillip Tate Procedure Date: 04/28/2024 11:24 AM MRN: [id number] Endoscopist: Landon Pinion P. General Kenner , MD, [id number] Age: 68 Referring MD:  Date of Birth: August 30, 1956 Gender: Male Account #: [id number] Procedure:                Colonoscopy Indications:              Iron  deficiency anemia, history of rectal cancer                            s/p resection with transverse colostomy Medicines:                Monitored Anesthesia Care Procedure:                Pre-Anesthesia Assessment:                           - Prior to the procedure, a History and Physical                            was performed, and patient medications and                            allergies were reviewed. The patient's tolerance of                            previous anesthesia was also reviewed. The risks                            and benefits of the procedure and the sedation                            options and risks were discussed with the patient.                            All questions were answered, and informed consent                            was obtained. Prior Anticoagulants: The patient has                            taken no anticoagulant or antiplatelet agents. ASA                            Grade Assessment: III - A patient with severe                            systemic disease. After reviewing the risks and                            benefits, the patient was deemed in satisfactory                            condition to undergo the procedure.  After obtaining informed consent, the colonoscope                            was passed under direct vision. Throughout the                            procedure, the patient's blood pressure, pulse, and                            oxygen  saturations were monitored continuously. The                            CF [id number] #[id number] was introduced through the                             transverse colostomy and advanced to the the                            terminal ileum, with identification of the                            appendiceal orifice and IC valve. The colonoscopy                            was performed without difficulty. The patient                            tolerated the procedure well. The quality of the                            bowel preparation was adequate. The terminal ileum,                            ileocecal valve, appendiceal orifice, and rectum                            were photographed. Scope In: 11:39:56 AM Scope Out: 11:53:11 AM Scope Withdrawal Time: 0 hours 12 minutes 28 seconds  Total Procedure Duration: 0 hours 13 minutes 15 seconds  Findings:                 The terminal ileum appeared normal.                           A 3 mm polyp was found in the cecum. The polyp was                            sessile. The polyp was removed with a cold snare.                            Resection and retrieval were complete.                           A 3 mm polyp was found in the transverse colon. The  polyp was sessile. The polyp was removed with a                            cold snare. Resection and retrieval were complete.                           The exam was otherwise without abnormality. Ostomy                            appeared normal. Complications:            No immediate complications. Estimated blood loss:                            Minimal. Estimated Blood Loss:     Estimated blood loss was minimal. Impression:               - The examined portion of the ileum was normal.                           - One 3 mm polyp in the cecum, removed with a cold                            snare. Resected and retrieved.                           - One 3 mm polyp in the transverse colon, removed                            with a cold snare. Resected and retrieved.                           - Normal ostomy.                            - The examination was otherwise normal. Recommendation:           - Patient has a contact number available for                            emergencies. The signs and symptoms of potential                            delayed complications were discussed with the                            patient. Return to normal activities tomorrow.                            Written discharge instructions were provided to the                            patient.                           - Resume previous diet.                           -  Continue present medications.                           - Await pathology results.                           - Continue iron , trend iron  studies / CBC - go to                            the lab at your convenience Landon Pinion P. Arnella Pralle, MD 04/28/2024 12:00:07 PM This report has been signed electronically.

## 2024-04-28 NOTE — Progress Notes (Signed)
 Vss nad trans to pacu

## 2024-04-28 NOTE — Progress Notes (Signed)
 Called to room to assist during endoscopic procedure.  Patient ID and intended procedure confirmed with present staff. Received instructions for my participation in the procedure from the performing physician.

## 2024-04-28 NOTE — Patient Instructions (Addendum)
 Educational handout provided to patient related to Polyps  Resume previous diet  Continue present medications  Awaiting pathology results  YOU HAD AN ENDOSCOPIC PROCEDURE TODAY AT THE [facility]:   Refer to the procedure report that was given to you for any specific questions about what was found during the examination.  If the procedure report does not answer your questions, please call your gastroenterologist to clarify.  If you requested that your care partner not be given the details of your procedure findings, then the procedure report has been included in a sealed envelope for you to review at your convenience later.  YOU SHOULD EXPECT: Some feelings of bloating in the abdomen. Passage of more gas than usual.  Walking can help get rid of the air that was put into your GI tract during the procedure and reduce the bloating. If you had a lower endoscopy (such as a colonoscopy or flexible sigmoidoscopy) you may notice spotting of blood in your stool or on the toilet paper. If you underwent a bowel prep for your procedure, you may not have a normal bowel movement for a few days.  Please Note:  You might notice some irritation and congestion in your nose or some drainage.  This is from the oxygen used during your procedure.  There is no need for concern and it should clear up in a day or so.  SYMPTOMS TO REPORT IMMEDIATELY:  Following lower endoscopy (colonoscopy or flexible sigmoidoscopy):  Excessive amounts of blood in the stool  Significant tenderness or worsening of abdominal pains  Swelling of the abdomen that is new, acute  Fever of 100F or higher  For urgent or emergent issues, a gastroenterologist can be reached at any hour by calling [phone]. Do not use MyChart messaging for urgent concerns.    DIET:  We do recommend a small meal at first, but then you may proceed to your regular diet.  Drink plenty of fluids but you should avoid alcoholic beverages for 24  hours.  ACTIVITY:  You should plan to take it easy for the rest of today and you should NOT DRIVE or use heavy machinery until tomorrow (because of the sedation medicines used during the test).    FOLLOW UP: Our staff will call the number listed on your records the next business day following your procedure.  We will call around 7:15- 8:00 am to check on you and address any questions or concerns that you may have regarding the information given to you following your procedure. If we do not reach you, we will leave a message.     If any biopsies were taken you will be contacted by phone or by letter within the next 1-3 weeks.  Please call us at [phone] if you have not heard about the biopsies in 3 weeks.    SIGNATURES/CONFIDENTIALITY: You and/or your care partner have signed paperwork which will be entered into your electronic medical record.  These signatures attest to the fact that that the information above on your After Visit Summary has been reviewed and is understood.  Full responsibility of the confidentiality of this discharge information lies with you and/or your care-partner.

## 2024-04-28 NOTE — Progress Notes (Signed)
 Pt's states no medical or surgical changes since previsit or office visit.

## 2024-04-28 NOTE — Progress Notes (Signed)
 [facility] History and Physical   Primary Care Physician:  Jearldine Mina, MD   Reason for Procedure:   Iron  deficiency, history of rectal cancer  Plan:    colonoscopy     HPI: Phillip Tate is a 68 y.o. male  here for colonoscopy - history of iron  deficiency, last colonoscopy 03/02/24 - limited by poor prep. States prep went better for this exam.   Patient denies any bowel symptoms at this time. Otherwise feels well without any cardiopulmonary symptoms.   I have discussed risks / benefits of anesthesia and endoscopic procedure with [facility] and they wish to proceed with the exams as outlined today.    Past Medical History:  Diagnosis Date   Atrial fibrillation (HCC)    had ablation- no issues since   Cataract    Coronary artery disease    DDD (degenerative disc disease), lumbar    Diabetes mellitus without complication (HCC)    Dyslipidemia    History of rectal cancer    dx 2006 --  Stage III, T3  N1  M0,  s/p  chemoradiation (07-10-2005 to 09-05-2005) and anterior colon resection 11-04-2006   History of shingles    Hyperlipidemia    Hypertension    Iron  deficiency anemia    intolerent PO iron --  gets IV iron ,  last one 03-12-2015 (Infed )   Organic impotence    Rectal cancer (HCC)    adenocarcinoma stage lll    Past Surgical History:  Procedure Laterality Date   ABLATION     for atrial fib 10-2022   ANAL RECTAL MANOMETRY N/A 09/04/2021   Procedure: ANO RECTAL MANOMETRY;  Surgeon: Sergio Dandy, MD;  Location: [facility];  Service: Endoscopy;  Laterality: N/A;   ANTERIOR COLON RESECTION W/ COLOSTOMY  11/04/2006   CATARACT EXTRACTION W/ INTRAOCULAR LENS IMPLANT Right sept 2015  &  feb 2016   COLONOSCOPY     COLONOSCOPY WITH ESOPHAGOGASTRODUODENOSCOPY (EGD)  last one 03-07-2010   COLONOSCOPY WITH PROPOFOL  N/A 10/23/2015   Procedure: COLONOSCOPY WITH PROPOFOL ;  Surgeon: Garrett Kallman, MD;  Location: [facility];  Service:  Endoscopy;  Laterality: N/A;   COLOSTOMY TAKEDOWN  05/2006   EXAMINATION UNDER ANESTHESIA N/A 04/23/2015   Procedure: EXAM UNDER ANESTHESIA, MANIPULATION OF PENILE PROSTHESIS PUMP;  Surgeon: Mark Ottelin, MD;  Location: [facility];  Service: Urology;  Laterality: N/A;   HERNIA REPAIR     PENILE PROSTHESIS PLACEMENT  10/29/2009   PORT A CATH PLACEMENT  07/23/2005   prolapsed hemorrhoid      Prior to Admission medications   Medication Sig Start Date End Date Taking? Authorizing Provider  acetaminophen  (TYLENOL ) 500 MG tablet Take 500 mg by mouth every 6 (six) hours as needed for mild pain.   Yes [provider]  amLODipine  (NORVASC ) 5 MG tablet Take by mouth. 01/20/24  Yes [provider]  ascorbic acid (VITAMIN C) 500 MG tablet Take 500 mg by mouth 2 (two) times daily.   Yes [provider]  carvedilol  (COREG ) 3.125 MG tablet Take 2 tablets (6.25 mg total) by mouth 2 (two) times daily with a meal. 10/09/23 04/28/24 Yes Samtani, Jai-Gurmukh, MD  Cholecalciferol (VITAMIN D) 125 MCG (5000 UT) CAPS Take 5,000 Units by mouth daily.   Yes [provider]  Continuous Glucose Sensor (DEXCOM G7 SENSOR) MISC  01/20/24  Yes [provider]  empagliflozin  (JARDIANCE ) 25 MG TABS tablet Take 25 mg by mouth daily.   Yes [provider]  eszopiclone (LUNESTA) 2 MG TABS tablet Take 2 mg by mouth at bedtime. 10/23/23  Yes [provider]  gabapentin  (NEURONTIN ) 300 MG capsule Take 300 mg by mouth 2 (two) times daily. 08/02/16  Yes [provider]  HUMALOG KWIKPEN 100 UNIT/ML KwikPen Inject into the skin. 01/20/24  Yes [provider]  ibuprofen  (ADVIL) 600 MG tablet Take 600 mg by mouth 2 (two) times daily as needed. 01/22/24  Yes [provider]  insulin  glargine (LANTUS ) 100 UNIT/ML injection Inject 0.2 mLs (20 Units total) into the skin at bedtime. Patient taking differently: Inject 10 Units into the skin at  bedtime. 05/23/23  Yes Doroteo Gasmen, MD  lisinopril  (ZESTRIL ) 40 MG tablet Take 1 tablet (40 mg total) by mouth daily. 01/22/23  Yes Macdonald Savoy, MD  meclizine (ANTIVERT) 25 MG tablet SMARTSIG:1 Tablet(s) By Mouth Every 12 Hours PRN 04/06/24  Yes [provider]  metFORMIN  (GLUCOPHAGE ) 1000 MG tablet Take 1,000 mg by mouth 2 (two) times daily. 03/23/24  Yes [provider]  NOVOLOG  FLEXPEN 100 UNIT/ML FlexPen Inject 20-40 Units into the skin 3 (three) times daily with meals. Per sliding scale 07/18/16  Yes [provider]  ondansetron  (ZOFRAN ) 4 MG tablet Take 4 mg by mouth as needed. 12/16/23  Yes [provider]  OZEMPIC, 1 MG/DOSE, 4 MG/3ML SOPN  03/25/24  Yes [provider]  Polyethyl Glycol-Propyl Glycol (SYSTANE FREE OP) Place 1 drop into both eyes 3 (three) times daily.   Yes [provider]  rosuvastatin  (CRESTOR ) 10 MG tablet Take 1 tablet (10 mg total) by mouth daily. Patient taking differently: Take 10 mg by mouth every evening. 09/18/16  Yes Lucendia Rusk, MD  sertraline  (ZOLOFT ) 25 MG tablet Take 25 mg by mouth daily. 06/18/22  Yes [provider]  tamsulosin  (FLOMAX ) 0.4 MG CAPS capsule Take 0.4 mg by mouth at bedtime.   Yes [provider]  traMADol  (ULTRAM ) 50 MG tablet Take 1 tablet (50 mg total) by mouth 2 (two) times daily as needed. 10/09/23  Yes Samtani, Jai-Gurmukh, MD  XIIDRA 5 % SOLN  04/27/24  Yes [provider]  zolpidem  (AMBIEN ) 10 MG tablet Take 10 mg by mouth at bedtime as needed for sleep. 02/02/23  Yes [provider]  Ferrous Sulfate (IRON  PO) Take 1 tablet by mouth daily.    [provider]  pantoprazole  (PROTONIX ) 40 MG tablet Take 40 mg by mouth 2 (two) times daily. 01/20/24   [provider]  polyethylene glycol (MIRALAX  / GLYCOLAX ) 17 g packet Take 17 g by mouth daily as needed.    [provider]    Current Outpatient Medications   Medication Sig Dispense Refill   acetaminophen  (TYLENOL ) 500 MG tablet Take 500 mg by mouth every 6 (six) hours as needed for mild pain.     amLODipine  (NORVASC ) 5 MG tablet Take by mouth.     ascorbic acid (VITAMIN C) 500 MG tablet Take 500 mg by mouth 2 (two) times daily.     carvedilol  (COREG ) 3.125 MG tablet Take 2 tablets (6.25 mg total) by mouth 2 (two) times daily with a meal.     Cholecalciferol (VITAMIN D) 125 MCG (5000 UT) CAPS Take 5,000 Units by mouth daily.     Continuous Glucose Sensor (DEXCOM G7 SENSOR) MISC      empagliflozin  (JARDIANCE ) 25 MG TABS tablet Take 25 mg by mouth daily.     eszopiclone (LUNESTA) 2 MG TABS  tablet Take 2 mg by mouth at bedtime.     gabapentin  (NEURONTIN ) 300 MG capsule Take 300 mg by mouth 2 (two) times daily.  0   HUMALOG KWIKPEN 100 UNIT/ML KwikPen Inject into the skin.     ibuprofen  (ADVIL) 600 MG tablet Take 600 mg by mouth 2 (two) times daily as needed.     insulin  glargine (LANTUS ) 100 UNIT/ML injection Inject 0.2 mLs (20 Units total) into the skin at bedtime. (Patient taking differently: Inject 10 Units into the skin at bedtime.) 10 mL 11   lisinopril  (ZESTRIL ) 40 MG tablet Take 1 tablet (40 mg total) by mouth daily.     meclizine (ANTIVERT) 25 MG tablet SMARTSIG:1 Tablet(s) By Mouth Every 12 Hours PRN     metFORMIN  (GLUCOPHAGE ) 1000 MG tablet Take 1,000 mg by mouth 2 (two) times daily.     NOVOLOG  FLEXPEN 100 UNIT/ML FlexPen Inject 20-40 Units into the skin 3 (three) times daily with meals. Per sliding scale  2   ondansetron  (ZOFRAN ) 4 MG tablet Take 4 mg by mouth as needed.     OZEMPIC, 1 MG/DOSE, 4 MG/3ML SOPN      Polyethyl Glycol-Propyl Glycol (SYSTANE FREE OP) Place 1 drop into both eyes 3 (three) times daily.     rosuvastatin  (CRESTOR ) 10 MG tablet Take 1 tablet (10 mg total) by mouth daily. (Patient taking differently: Take 10 mg by mouth every evening.) 90 tablet 3   sertraline  (ZOLOFT ) 25 MG tablet Take 25 mg by mouth daily.      tamsulosin  (FLOMAX ) 0.4 MG CAPS capsule Take 0.4 mg by mouth at bedtime.     traMADol  (ULTRAM ) 50 MG tablet Take 1 tablet (50 mg total) by mouth 2 (two) times daily as needed.     XIIDRA 5 % SOLN      zolpidem  (AMBIEN ) 10 MG tablet Take 10 mg by mouth at bedtime as needed for sleep.     Ferrous Sulfate (IRON  PO) Take 1 tablet by mouth daily.     pantoprazole  (PROTONIX ) 40 MG tablet Take 40 mg by mouth 2 (two) times daily.     polyethylene glycol (MIRALAX  / GLYCOLAX ) 17 g packet Take 17 g by mouth daily as needed.     Current Facility-Administered Medications  Medication Dose Route Frequency Provider Last Rate Last Admin   0.9 %  sodium chloride  infusion  500 mL Intravenous Once Sarahbeth Cashin, Lendon Queen, MD        Allergies as of 04/28/2024 - Review Complete 04/28/2024  Allergen Reaction Noted   Cardizem cd [diltiazem hcl er beads] Swelling 07/04/2013   Pork-derived products Other (See Comments) 10/14/2022   Toprol xl [metoprolol] Other (See Comments) 07/04/2013   Tricor [fenofibrate] Other (See Comments) 07/04/2013    Family History  Problem Relation Age of Onset   Hypertension Father    Diabetes Mother    Heart attack Neg Hx    Colon cancer Neg Hx    Esophageal cancer Neg Hx    Rectal cancer Neg Hx    Stomach cancer Neg Hx     Social History   Socioeconomic History   Marital status: Married    Spouse name: Not on file   Number of children: 3   Years of education: Not on file   Highest education level: Not on file  Occupational History   Not on file  Tobacco Use   Smoking status: Never   Smokeless tobacco: Never  Vaping Use   Vaping status: Never Used  Substance and Sexual Activity   Alcohol use: Yes    Comment: Rarely   Drug use: No   Sexual activity: Yes  Other Topics Concern   Not on file  Social History Narrative   Not on file   Social Drivers of Health   Financial Resource Strain: Not on file  Food Insecurity: No Food Insecurity (04/02/2023)   Hunger Vital  Sign    Worried About Running Out of Food in the Last Year: Never true    Ran Out of Food in the Last Year: Never true  Transportation Needs: No Transportation Needs (04/02/2023)   PRAPARE - Administrator, Civil Service (Medical): No    Lack of Transportation (Non-Medical): No  Physical Activity: Not on file  Stress: Not on file  Social Connections: Not on file  Intimate Partner Violence: Not At Risk (04/02/2023)   Humiliation, Afraid, Rape, and Kick questionnaire    Fear of Current or Ex-Partner: No    Emotionally Abused: No    Physically Abused: No    Sexually Abused: No    Review of Systems: All other review of systems negative except as mentioned in the HPI.  Physical Exam: Vital signs BP (!) 149/78   Pulse 74   Temp (!) 97.3 F (36.3 C) (Temporal)   Resp 10   Ht 5\' 9"  (1.753 m)   Wt 219 lb (99.3 kg)   SpO2 100%   BMI 32.34 kg/m   General:   Alert,  Well-developed, pleasant and cooperative in NAD Lungs:  Clear throughout to auscultation.   Heart:  Regular rate and rhythm Abdomen:  Soft, nontender and nondistended.   Neuro/Psych:  Alert and cooperative. Normal mood and affect. A and O x 3  Christi Coward, MD [facility]

## 2024-04-29 ENCOUNTER — Encounter: Payer: Self-pay | Admitting: Gastroenterology

## 2024-04-29 ENCOUNTER — Telehealth: Payer: Self-pay

## 2024-04-29 LAB — IRON,TIBC AND FERRITIN PANEL
%SAT: 16 % — ABNORMAL LOW (ref 20–48)
Ferritin: 31 ng/mL (ref 24–380)
Iron: 62 ug/dL (ref 50–180)
TIBC: 382 ug/dL (ref 250–425)

## 2024-04-29 NOTE — Telephone Encounter (Signed)
  Follow up Call-     04/28/2024   11:08 AM 03/02/2024    8:15 AM  Call back number  Post procedure Call Back phone  # [phone]  Permission to leave phone message Yes Yes     Patient questions:  Do you have a fever, pain , or abdominal swelling? No. Pain Score  0 *  Have you tolerated food without any problems? Yes.    Have you been able to return to your normal activities? Yes.    Do you have any questions about your discharge instructions: Diet   No. Medications  No. Follow up visit  No.  Do you have questions or concerns about your Care? No.  Actions: * If pain score is 4 or above: No action needed, pain <4.

## 2024-05-02 LAB — SURGICAL PATHOLOGY

## 2024-05-05 ENCOUNTER — Encounter: Payer: Self-pay | Admitting: Gastroenterology

## 2024-05-11 ENCOUNTER — Emergency Department (HOSPITAL_COMMUNITY)

## 2024-05-11 ENCOUNTER — Emergency Department (HOSPITAL_COMMUNITY)
Admission: EM | Admit: 2024-05-11 | Discharge: 2024-05-11 | Disposition: A | Discharge status: Home / Self Care | Attending: Emergency Medicine | Admitting: Emergency Medicine

## 2024-05-11 ENCOUNTER — Other Ambulatory Visit: Payer: Self-pay

## 2024-05-11 ENCOUNTER — Encounter (HOSPITAL_COMMUNITY): Payer: Self-pay

## 2024-05-11 DIAGNOSIS — Y9301 Activity, walking, marching and hiking: Secondary | ICD-10-CM | POA: Diagnosis not present

## 2024-05-11 DIAGNOSIS — R0789 Other chest pain: Secondary | ICD-10-CM | POA: Diagnosis not present

## 2024-05-11 DIAGNOSIS — I700 Atherosclerosis of aorta: Secondary | ICD-10-CM | POA: Diagnosis not present

## 2024-05-11 DIAGNOSIS — W19XXXA Unspecified fall, initial encounter: Secondary | ICD-10-CM | POA: Diagnosis not present

## 2024-05-11 DIAGNOSIS — M549 Dorsalgia, unspecified: Secondary | ICD-10-CM | POA: Diagnosis not present

## 2024-05-11 DIAGNOSIS — R519 Headache, unspecified: Secondary | ICD-10-CM | POA: Diagnosis not present

## 2024-05-11 DIAGNOSIS — S01511A Laceration without foreign body of lip, initial encounter: Secondary | ICD-10-CM | POA: Diagnosis not present

## 2024-05-11 DIAGNOSIS — S0990XA Unspecified injury of head, initial encounter: Secondary | ICD-10-CM | POA: Diagnosis not present

## 2024-05-11 DIAGNOSIS — Z79899 Other long term (current) drug therapy: Secondary | ICD-10-CM | POA: Diagnosis not present

## 2024-05-11 DIAGNOSIS — Z7984 Long term (current) use of oral hypoglycemic drugs: Secondary | ICD-10-CM | POA: Diagnosis not present

## 2024-05-11 DIAGNOSIS — M25561 Pain in right knee: Secondary | ICD-10-CM | POA: Diagnosis not present

## 2024-05-11 DIAGNOSIS — W01198A Fall on same level from slipping, tripping and stumbling with subsequent striking against other object, initial encounter: Secondary | ICD-10-CM | POA: Insufficient documentation

## 2024-05-11 DIAGNOSIS — Y92512 Supermarket, store or market as the place of occurrence of the external cause: Secondary | ICD-10-CM | POA: Diagnosis not present

## 2024-05-11 DIAGNOSIS — Z794 Long term (current) use of insulin: Secondary | ICD-10-CM | POA: Insufficient documentation

## 2024-05-11 DIAGNOSIS — S199XXA Unspecified injury of neck, initial encounter: Secondary | ICD-10-CM | POA: Diagnosis not present

## 2024-05-11 DIAGNOSIS — R079 Chest pain, unspecified: Secondary | ICD-10-CM | POA: Diagnosis not present

## 2024-05-11 DIAGNOSIS — I6782 Cerebral ischemia: Secondary | ICD-10-CM | POA: Diagnosis not present

## 2024-05-11 DIAGNOSIS — S0993XA Unspecified injury of face, initial encounter: Secondary | ICD-10-CM | POA: Diagnosis present

## 2024-05-11 LAB — CBC WITH DIFFERENTIAL/PLATELET
Abs Immature Granulocytes: 0.04 10*3/uL (ref 0.00–0.07)
Basophils Absolute: 0.1 10*3/uL (ref 0.0–0.1)
Basophils Relative: 1 %
Eosinophils Absolute: 0.2 10*3/uL (ref 0.0–0.5)
Eosinophils Relative: 2 %
HCT: 51.9 % (ref 39.0–52.0)
Hemoglobin: 15.6 g/dL (ref 13.0–17.0)
Immature Granulocytes: 0 %
Lymphocytes Relative: 24 %
Lymphs Abs: 2.2 10*3/uL (ref 0.7–4.0)
MCH: 24.6 pg — ABNORMAL LOW (ref 26.0–34.0)
MCHC: 30.1 g/dL (ref 30.0–36.0)
MCV: 81.9 fL (ref 80.0–100.0)
Monocytes Absolute: 0.5 10*3/uL (ref 0.1–1.0)
Monocytes Relative: 6 %
Neutro Abs: 5.9 10*3/uL (ref 1.7–7.7)
Neutrophils Relative %: 67 %
Platelets: 285 10*3/uL (ref 150–400)
RBC: 6.34 MIL/uL — ABNORMAL HIGH (ref 4.22–5.81)
RDW: 17.8 % — ABNORMAL HIGH (ref 11.5–15.5)
WBC: 8.9 10*3/uL (ref 4.0–10.5)
nRBC: 0 % (ref 0.0–0.2)

## 2024-05-11 LAB — BASIC METABOLIC PANEL WITH GFR
Anion gap: 8 (ref 5–15)
BUN: 18 mg/dL (ref 8–23)
CO2: 26 mmol/L (ref 22–32)
Calcium: 9.5 mg/dL (ref 8.9–10.3)
Chloride: 100 mmol/L (ref 98–111)
Creatinine, Ser: 0.79 mg/dL (ref 0.61–1.24)
GFR, Estimated: >60 mL/min (ref >60)
Glucose, Bld: 142 mg/dL — ABNORMAL HIGH (ref 70–99)
Potassium: 4.8 mmol/L (ref 3.5–5.1)
Sodium: 134 mmol/L — ABNORMAL LOW (ref 135–145)

## 2024-05-11 LAB — CBG MONITORING, ED: Glucose-Capillary: 139 mg/dL — ABNORMAL HIGH (ref 70–99)

## 2024-05-11 LAB — TROPONIN I (HIGH SENSITIVITY): Troponin I (High Sensitivity): 3 ng/L (ref <18)

## 2024-05-11 MED ORDER — KETOROLAC TROMETHAMINE 15 MG/ML IJ SOLN
15.0000 mg | Freq: Once | INTRAMUSCULAR | Status: AC
  Administered 2024-05-11: 15 mg via INTRAVENOUS
  Filled 2024-05-11: qty 1

## 2024-05-11 MED ORDER — OXYCODONE-ACETAMINOPHEN 5-325 MG PO TABS
2.0000 | ORAL_TABLET | Freq: Once | ORAL | Status: AC
  Administered 2024-05-11: 2 via ORAL
  Filled 2024-05-11: qty 2

## 2024-05-11 MED ORDER — LIDOCAINE HCL (PF) 1 % IJ SOLN
10.0000 mL | Freq: Once | INTRAMUSCULAR | Status: AC
  Administered 2024-05-11: 10 mL
  Filled 2024-05-11: qty 30

## 2024-05-11 NOTE — Discharge Instructions (Signed)
 The sutures on your face need to come out in 5 to 7 days.  This can be done at any of our urgent cares, primary care doctor, or this emergency department.  You can take 600 mg of ibuprofen  every 6 hours as needed for pain.  Please keep the area clean and dry.  I would avoid submerging in water for prolonged periods of time.  You can shower normally.  Please return to the emergency department sooner for any worsening symptoms.

## 2024-05-11 NOTE — ED Provider Triage Note (Signed)
 Emergency Medicine Provider Triage Evaluation Note  Phillip Tate , a 68 y.o. male  was evaluated in triage.  Pt complains of fall.  Review of Systems  Positive:  Negative:   Physical Exam  BP 136/77   Pulse 80   Temp 98.4 F (36.9 C) (Oral)   Resp 18   Ht 5\' 9"  (1.753 m)   Wt 95.3 kg   SpO2 96%   BMI 31.01 kg/m  Gen:   Awake, no distress   Resp:  Normal effort  MSK:   Moves extremities without difficulty  Other:    Medical Decision Making  Medically screening exam initiated at 1:04 PM.  Appropriate orders placed.  Phillip Tate was informed that the remainder of the evaluation will be completed by another provider, this initial triage assessment does not replace that evaluation, and the importance of remaining in the ED until their evaluation is complete.  Fall while shopping in [place]. Patient stating that it happened so quickly that he isnt sure what happened - he might have tripped on something. Patient denies any prodromal symptoms, but states that his chest and top of his head is now hurting. Also with a lip lac. Also with right knee pain.    [place], [place] 05/11/24 [phone]

## 2024-05-11 NOTE — ED Triage Notes (Signed)
 BIB EMS from [facility], pt fell while shopping and hit his head. Hematoma to left side of head, laceration to right side of upper lip. Pt does not remember what happened. no blood thinners. C/o back pain and chest wall pain.

## 2024-05-11 NOTE — ED Provider Notes (Signed)
 [place] EMERGENCY DEPARTMENT AT [facility] Provider Note   CSN: [id number] Arrival date & time: 05/11/24  1222     History Chief Complaint  Patient presents with   Fall    Phillip Tate is a 68 y.o. male patient who presents to the emergency department today for further evaluation of a mechanical trip and fall that occurred at the store.  Patient states he was walking when he tripped and fell forward.  He did hit his head but did not lose consciousness.  Since then has been complaining of a lip laceration, right knee pain, and chest wall pain.  Patient denies any anticoagulation.  Denies any weakness or numbness to his arms or legs.   Fall       Home Medications Prior to Admission medications   Medication Sig Start Date End Date Taking? Authorizing Provider  acetaminophen  (TYLENOL ) 500 MG tablet Take 500 mg by mouth every 6 (six) hours as needed for mild pain.    [provider]  amLODipine  (NORVASC ) 5 MG tablet Take by mouth. 01/20/24   [provider]  ascorbic acid (VITAMIN C) 500 MG tablet Take 500 mg by mouth 2 (two) times daily.    [provider]  carvedilol  (COREG ) 3.125 MG tablet Take 2 tablets (6.25 mg total) by mouth 2 (two) times daily with a meal. 10/09/23 04/28/24  Samtani, Jai-Gurmukh, MD  Cholecalciferol (VITAMIN D) 125 MCG (5000 UT) CAPS Take 5,000 Units by mouth daily.    [provider]  Continuous Glucose Sensor (DEXCOM G7 SENSOR) MISC  01/20/24   [provider]  empagliflozin  (JARDIANCE ) 25 MG TABS tablet Take 25 mg by mouth daily.    [provider]  eszopiclone (LUNESTA) 2 MG TABS tablet Take 2 mg by mouth at bedtime. 10/23/23   [provider]  Ferrous Sulfate (IRON  PO) Take 1 tablet by mouth daily.    [provider]  gabapentin  (NEURONTIN ) 300 MG capsule Take 300 mg by mouth 2 (two) times daily. 08/02/16   [provider]  HUMALOG KWIKPEN 100 UNIT/ML KwikPen  Inject into the skin. 01/20/24   [provider]  ibuprofen  (ADVIL) 600 MG tablet Take 600 mg by mouth 2 (two) times daily as needed. 01/22/24   [provider]  insulin  glargine (LANTUS ) 100 UNIT/ML injection Inject 0.2 mLs (20 Units total) into the skin at bedtime. Patient taking differently: Inject 10 Units into the skin at bedtime. 05/23/23   Doroteo Gasmen, MD  lisinopril  (ZESTRIL ) 40 MG tablet Take 1 tablet (40 mg total) by mouth daily. 01/22/23   Macdonald Savoy, MD  meclizine (ANTIVERT) 25 MG tablet SMARTSIG:1 Tablet(s) By Mouth Every 12 Hours PRN 04/06/24   [provider]  metFORMIN  (GLUCOPHAGE ) 1000 MG tablet Take 1,000 mg by mouth 2 (two) times daily. 03/23/24   [provider]  NOVOLOG  FLEXPEN 100 UNIT/ML FlexPen Inject 20-40 Units into the skin 3 (three) times daily with meals. Per sliding scale 07/18/16   [provider]  ondansetron  (ZOFRAN ) 4 MG tablet Take 4 mg by mouth as needed. 12/16/23   [provider]  OZEMPIC, 1 MG/DOSE, 4 MG/3ML SOPN  03/25/24   [provider]  pantoprazole  (PROTONIX ) 40 MG tablet Take 40 mg by mouth 2 (two) times daily. 01/20/24   [provider]  Polyethyl Glycol-Propyl Glycol (SYSTANE FREE OP) Place 1 drop into both eyes 3 (three) times daily.    [provider]  polyethylene glycol (  MIRALAX  / GLYCOLAX ) 17 g packet Take 17 g by mouth daily as needed.    [provider]  rosuvastatin  (CRESTOR ) 10 MG tablet Take 1 tablet (10 mg total) by mouth daily. Patient taking differently: Take 10 mg by mouth every evening. 09/18/16   Lucendia Rusk, MD  sertraline  (ZOLOFT ) 25 MG tablet Take 25 mg by mouth daily. 06/18/22   [provider]  tamsulosin  (FLOMAX ) 0.4 MG CAPS capsule Take 0.4 mg by mouth at bedtime.    [provider]  traMADol  (ULTRAM ) 50 MG tablet Take 1 tablet (50 mg total) by mouth 2 (two) times daily as needed. 10/09/23   Samtani,  Jai-Gurmukh, MD  XIIDRA 5 % SOLN  04/27/24   [provider]  zolpidem  (AMBIEN ) 10 MG tablet Take 10 mg by mouth at bedtime as needed for sleep. 02/02/23   [provider]      Allergies    Cardizem cd [diltiazem hcl er beads], Pork-derived products, Toprol xl [metoprolol], and Tricor [fenofibrate]    Review of Systems   Review of Systems  All other systems reviewed and are negative.   Physical Exam Updated Vital Signs BP (!) 137/90 (BP Location: Right Arm)   Pulse 81   Temp 98.4 F (36.9 C) (Oral)   Resp 18   Ht 5\' 9"  (1.753 m)   Wt 95.3 kg   SpO2 99%   BMI 31.01 kg/m  Physical Exam Vitals and nursing note reviewed.  Constitutional:      Appearance: Normal appearance.  HENT:     Head: Normocephalic.     Comments: 3 cm laceration to the right upper lip that does cross the vermilion border that is through and through. Eyes:     General:        Right eye: No discharge.        Left eye: No discharge.     Conjunctiva/sclera: Conjunctivae normal.  Pulmonary:     Effort: Pulmonary effort is normal.  Skin:    General: Skin is warm and dry.     Findings: No rash.  Neurological:     General: No focal deficit present.     Mental Status: He is alert.  Psychiatric:        Mood and Affect: Mood normal.        Behavior: Behavior normal.     ED Results / Procedures / Treatments   Labs (all labs ordered are listed, but only abnormal results are displayed) Labs Reviewed  CBC WITH DIFFERENTIAL/PLATELET - Abnormal; Notable for the following components:      Result Value   RBC 6.34 (*)    MCH 24.6 (*)    RDW 17.8 (*)    All other components within normal limits  BASIC METABOLIC PANEL WITH GFR - Abnormal; Notable for the following components:   Sodium 134 (*)    Glucose, Bld 142 (*)    All other components within normal limits  CBG MONITORING, ED - Abnormal; Notable for the following components:   Glucose-Capillary 139 (*)    All other components within  normal limits  TROPONIN I (HIGH SENSITIVITY)    EKG None  Radiology DG Chest 2 View Result Date: 05/11/2024 CLINICAL DATA:  Fall.  Chest pain. EXAM: CHEST - 2 VIEW COMPARISON:  01/13/2023. FINDINGS: The heart size and mediastinal contours are within normal limits. Aortic atherosclerosis. No focal consolidation, pleural effusion, or pneumothorax. No acute osseous abnormality identified. Degenerative changes of the thoracic spine. IMPRESSION: No  acute findings in the chest. Electronically Signed   By: Mannie Seek M.D.   On: 05/11/2024 14:25   CT Head Wo Contrast Result Date: 05/11/2024 CLINICAL DATA:  Head trauma, fall. EXAM: CT HEAD WITHOUT CONTRAST CT CERVICAL SPINE WITHOUT CONTRAST TECHNIQUE: Multidetector CT imaging of the head and cervical spine was performed following the standard protocol without intravenous contrast. Multiplanar CT image reconstructions of the cervical spine were also generated. RADIATION DOSE REDUCTION: This exam was performed according to the departmental dose-optimization program which includes automated exposure control, adjustment of the mA and/or kV according to patient size and/or use of iterative reconstruction technique. COMPARISON:  CT head and cervical spine 12/09/2022. FINDINGS: CT HEAD FINDINGS Brain: Posterior fossa is incompletely visualized on sagittal and coronal reformats. No acute intracranial hemorrhage. No CT evidence of acute infarct. Nonspecific hypoattenuation in the periventricular and subcortical white matter favored to reflect chronic microvascular ischemic changes. No edema, mass effect, or midline shift. The basilar cisterns are patent. Ventricles: The ventricles are normal. Vascular: No hyperdense vessel or unexpected calcification. Skull: No acute or aggressive finding. Orbits: Orbits are symmetric. Sinuses: Mild mucosal thickening in the ethmoid sinuses. 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 Other: Visualized mastoid air cells are  clear. CT CERVICAL SPINE FINDINGS Alignment: Alignment is maintained. No significant listhesis. No facet subluxation or dislocation. Skull base and vertebrae: No acute fracture. No primary bone lesion or focal pathologic process. Soft tissues and spinal canal: No prevertebral fluid or swelling. No visible canal hematoma. Disc levels: Disc space narrowing at multiple levels most pronounced at C5-6. Disc osteophyte complex at C3-4 resulting in mild spinal canal stenosis. Additional disc osteophyte complexes at C4-5 and C5-6. Mild spinal canal stenosis noted at C5-6. Facet arthrosis and uncovertebral hypertrophy throughout the cervical spine. Foraminal narrowing is most pronounced at C3-4 and C5-6. Upper chest: Negative. Other: None. IMPRESSION: No CT evidence of acute intracranial abnormality. No acute fracture or traumatic malalignment of the cervical spine. Degenerative changes of the cervical spine as above. Electronically Signed   By: Denny Flack M.D.   On: 05/11/2024 14:21   CT Cervical Spine Wo Contrast Result Date: 05/11/2024 CLINICAL DATA:  Head trauma, fall. EXAM: CT HEAD WITHOUT CONTRAST CT CERVICAL SPINE WITHOUT CONTRAST TECHNIQUE: Multidetector CT imaging of the head and cervical spine was performed following the standard protocol without intravenous contrast. Multiplanar CT image reconstructions of the cervical spine were also generated. RADIATION DOSE REDUCTION: This exam was performed according to the departmental dose-optimization program which includes automated exposure control, adjustment of the mA and/or kV according to patient size and/or use of iterative reconstruction technique. COMPARISON:  CT head and cervical spine 12/09/2022. FINDINGS: CT HEAD FINDINGS Brain: Posterior fossa is incompletely visualized on sagittal and coronal reformats. No acute intracranial hemorrhage. No CT evidence of acute infarct. Nonspecific hypoattenuation in the periventricular and subcortical white matter  favored to reflect chronic microvascular ischemic changes. No edema, mass effect, or midline shift. The basilar cisterns are patent. Ventricles: The ventricles are normal. Vascular: No hyperdense vessel or unexpected calcification. Skull: No acute or aggressive finding. Orbits: Orbits are symmetric. Sinuses: Mild mucosal thickening in the ethmoid sinuses. 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 Other: Visualized mastoid air cells are clear. CT CERVICAL SPINE FINDINGS Alignment: Alignment is maintained. No significant listhesis. No facet subluxation or dislocation. Skull base and vertebrae: No acute fracture. No primary bone lesion or focal pathologic process. Soft tissues and spinal canal: No prevertebral fluid or swelling. No visible canal  hematoma. Disc levels: Disc space narrowing at multiple levels most pronounced at C5-6. Disc osteophyte complex at C3-4 resulting in mild spinal canal stenosis. Additional disc osteophyte complexes at C4-5 and C5-6. Mild spinal canal stenosis noted at C5-6. Facet arthrosis and uncovertebral hypertrophy throughout the cervical spine. Foraminal narrowing is most pronounced at C3-4 and C5-6. Upper chest: Negative. Other: None. IMPRESSION: No CT evidence of acute intracranial abnormality. No acute fracture or traumatic malalignment of the cervical spine. Degenerative changes of the cervical spine as above. Electronically Signed   By: Denny Flack M.D.   On: 05/11/2024 14:21   DG Knee 2 Views Right Result Date: 05/11/2024 CLINICAL DATA:  Status post fall, right knee pain EXAM: RIGHT KNEE - 2 VIEW COMPARISON:  None Available. FINDINGS: No evidence of fracture, dislocation, or joint effusion. No evidence of arthropathy or other focal bone abnormality. Soft tissues are unremarkable. Trace atherosclerotic plaque along the arterial structures. IMPRESSION: Negative. Electronically Signed   By: Fernando Hoyer M.D.   On: 05/11/2024 14:18    Procedures .Laceration  Repair  Date/Time: 05/11/2024 4:54 PM  Performed by: Darletta Ehrich, PA-C Authorized by: Darletta Ehrich, PA-C   Consent:    Consent obtained:  Verbal   Consent given by:  Patient   Risks, benefits, and alternatives were discussed: yes     Risks discussed:  Infection Universal protocol:    Procedure explained and questions answered to patient or proxy's satisfaction: yes     Relevant documents present and verified: yes     Test results available: yes     Imaging studies available: yes     Required blood products, implants, devices, and special equipment available: yes     Site/side marked: yes     Patient identity confirmed:  Verbally with patient and arm band Anesthesia:    Anesthesia method:  Local infiltration   Local anesthetic:  Lidocaine  1% w/o epi Laceration details:    Location:  Lip   Lip location:  Upper lip, full thickness   Vermilion border involved: yes     Height of lip laceration:  Up to half vertical height   Length (cm):  3 Pre-procedure details:    Preparation:  Patient was prepped and draped in usual sterile fashion Exploration:    Hemostasis achieved with:  Direct pressure   Imaging outcome: foreign body not noted     Wound exploration: wound explored through full range of motion     Wound extent: areolar tissue violated and fascia violated     Wound extent: no foreign body, no signs of injury, no nerve damage, no tendon damage, no underlying fracture and no vascular damage   Treatment:    Area cleansed with:  Povidone-iodine   Amount of cleaning:  Extensive   Debridement:  None   Undermining:  None Skin repair:    Repair method:  Sutures   Number of sutures: Simple interrupted 4 prolene and 3 vicryl.     Medications Ordered in ED Medications  lidocaine  (PF) (XYLOCAINE ) 1 % injection 10 mL (10 mLs Infiltration Given 05/11/24 1707)  ketorolac  (TORADOL ) 15 MG/ML injection 15 mg (15 mg Intravenous Given 05/11/24 1549)  oxyCODONE-acetaminophen   (PERCOCET/ROXICET) 5-325 MG per tablet 2 tablet (2 tablets Oral Given 05/11/24 1706)    ED Course/ Medical Decision Making/ A&P   {   Click here for ABCD2, HEART and other calculators  Medical Decision Making Trebor J Romig is a 68 y.o. male patient who presents to the  emergency department today for further evaluation of mechanical trip and fall.  Workup was initiated in triage.  I reviewed all of the imaging studies these were all normal.  I do agree with the radiologist interpretation.  Lip laceration will require complex closure with accurate alignment of the vermilion border and internal mucosa.  Laceration repaired with simple interrupted sutures. Wound care discussed.  Second Trope not needed as this is likely traumatic chest wall pain.  Strict turn precaution were discussed.  Sutures will need to come out in 5 to 7 days.  He is safe for discharge at this time.  Will treat pain conservatively with 600 mg of ibuprofen  every 6 hours.  Risk Prescription drug management.    Final Clinical Impression(s) / ED Diagnoses Final diagnoses:  Fall, initial encounter  Lip laceration, initial encounter    Rx / DC Orders ED Discharge Orders     None         Darletta Ehrich, [place] 05/11/24 1735    Merdis Stalling, MD 05/11/24 2224

## 2024-05-12 NOTE — ED Notes (Cosign Needed)
 Upon chart review, patient's tetanus is out of date.  Patient was already discharged.  I contacted the patient and left a voicemail.  I also contacted the patient's wife and spoke to her about the need to get the tetanus updated.  They will come back to the emergency room in the next 5 to 7 days for suture removal and will request a tetanus update at that time.  All questions and concerns addressed.  Overall she also mentioned that the wound has been well-healing since last night and pain has significantly reduced after closure.  They expressed full understanding and will get tetanus updated when they come get their sutures out.   Angelyn Kennel [place], [place] 05/12/24 1339

## 2024-05-18 DIAGNOSIS — Z4802 Encounter for removal of sutures: Secondary | ICD-10-CM | POA: Diagnosis not present

## 2024-05-18 DIAGNOSIS — S01511A Laceration without foreign body of lip, initial encounter: Secondary | ICD-10-CM | POA: Diagnosis not present

## 2024-05-19 DIAGNOSIS — H18413 Arcus senilis, bilateral: Secondary | ICD-10-CM | POA: Diagnosis not present

## 2024-05-19 DIAGNOSIS — E119 Type 2 diabetes mellitus without complications: Secondary | ICD-10-CM | POA: Diagnosis not present

## 2024-05-19 DIAGNOSIS — H01009 Unspecified blepharitis unspecified eye, unspecified eyelid: Secondary | ICD-10-CM | POA: Diagnosis not present

## 2024-05-19 DIAGNOSIS — I10 Essential (primary) hypertension: Secondary | ICD-10-CM | POA: Diagnosis not present

## 2024-05-24 DIAGNOSIS — R55 Syncope and collapse: Secondary | ICD-10-CM | POA: Diagnosis not present

## 2024-05-24 DIAGNOSIS — Z9889 Other specified postprocedural states: Secondary | ICD-10-CM | POA: Diagnosis not present

## 2024-05-24 DIAGNOSIS — N62 Hypertrophy of breast: Secondary | ICD-10-CM | POA: Diagnosis not present

## 2024-05-24 DIAGNOSIS — Z23 Encounter for immunization: Secondary | ICD-10-CM | POA: Diagnosis not present

## 2024-05-24 DIAGNOSIS — Z9181 History of falling: Secondary | ICD-10-CM | POA: Diagnosis not present

## 2024-06-02 DIAGNOSIS — M5416 Radiculopathy, lumbar region: Secondary | ICD-10-CM | POA: Diagnosis not present

## 2024-06-02 DIAGNOSIS — M7918 Myalgia, other site: Secondary | ICD-10-CM | POA: Diagnosis not present

## 2024-06-02 DIAGNOSIS — Z87898 Personal history of other specified conditions: Secondary | ICD-10-CM | POA: Diagnosis not present

## 2024-06-07 ENCOUNTER — Ambulatory Visit (INDEPENDENT_AMBULATORY_CARE_PROVIDER_SITE_OTHER): Payer: Medicare HMO | Admitting: Otolaryngology

## 2024-06-09 DIAGNOSIS — M7918 Myalgia, other site: Secondary | ICD-10-CM | POA: Diagnosis not present

## 2024-06-11 DIAGNOSIS — H65191 Other acute nonsuppurative otitis media, right ear: Secondary | ICD-10-CM | POA: Diagnosis not present

## 2024-06-29 ENCOUNTER — Ambulatory Visit: Payer: Self-pay | Admitting: Surgery

## 2024-06-29 DIAGNOSIS — N62 Hypertrophy of breast: Secondary | ICD-10-CM | POA: Diagnosis not present

## 2024-06-29 NOTE — H&P (Signed)
 Subjective   Chief Complaint: Breast Problem     History of Present Illness: Phillip Tate is a 68 y.o. male who is seen today as an office consultation at the request of Dr. Husain for evaluation of Breast Problem .   This is a 67 year old male status post diagnosis of rectal cancer status post multiple abdominal surgeries and repairs of parastomal hernia as well as ventral hernia.  The patient has had several explorations for small bowel obstruction.  All of his previous surgery was performed either at [facility] or at [facility] near [place] .  Patient presents with a 23-month history of increasing asymmetry of the breast tissue.  The left retroareolar area has become very tender.  He denies any infection with no erythema or induration of this area.  No nipple discharge.  His main complaint is the worsening tenderness.  The patient underwent mammogram on 04/27/2024 that showed normal-appearing breast tissue with obvious asymmetry.  The patient suffered a fall in May and underwent CT scan of the head.  This showed no sign of acute injury.  It also showed no sign of brain mass.  The patient is referred to us  because of his worsening symptoms.  Review of Systems: A complete review of systems was obtained from the patient.  I have reviewed this information and discussed as appropriate with the patient.  See HPI as well for other ROS.  ROS    Medical History: Past Medical History:  Diagnosis Date   Arrhythmia    Diabetes mellitus type 2, uncomplicated (CMS/HHS-HCC)     Patient Active Problem List  Diagnosis   Rectal mucosa prolapse   Angina pectoris ()   DOE (dyspnea on exertion)   History of rectal cancer   Type 2 diabetes mellitus with hyperglycemia, with long-term current use of insulin  (CMS/HHS-HCC)   Coronary artery disease of native artery of native heart with stable angina pectoris ()   PVC (premature ventricular contraction)   Cervical disc disease    Essential hypertension   History of iron  deficiency   Incisional hernia, without obstruction or gangrene   Long term (current) use of insulin  (CMS/HHS-HCC)   Polyneuropathy due to type 2 diabetes mellitus (CMS/HHS-HCC)   Tinnitus of left ear   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)   Status post ablation of ventricular arrhythmia   Subareolar gynecomastia in male    Past Surgical History:  Procedure Laterality Date   HEMORRHOIDECTOMY INTERNAL & EXTERNAL N/A 11/01/2018   Procedure: HEMORRHOIDECTOMY, INTERNAL AND EXTERNAL, 2 OR MORE COLUMNS/GROUPS;  Surgeon: Florie Lonni Centers, MD;  Location: [facility];  Service: General Surgery;  Laterality: N/A;    intersphincteric prostatectomy with colonic J pouch to anal anastomosis and diverting ileostomy s/p reversal in 2007     CATARACT EXTRACTION     COLOSTOMY     HERNIA REPAIR       Allergies  Allergen Reactions   Fenofibrate Other (See Comments)    Constipation   Toprol Xl [Metoprolol Succinate] Headache    Current Outpatient Medications on File Prior to Visit  Medication Sig Dispense Refill   amLODIPine  (NORVASC ) 5 MG tablet Take 1 tablet (5 mg total) by mouth once daily (Patient taking differently: Take 2.5 mg by mouth once daily) 90 tablet 3   carvediloL  (COREG ) 12.5 MG tablet Take 1 tablet (12.5 mg total) by mouth 2 (two) times daily with meals (Patient taking differently: Take 3.75 mg by mouth 2 (two) times daily with meals) 60 tablet 11  cholecalciferol (VITAMIN D3) 2,000 unit capsule Take 2,000 Units by mouth at bedtime     ferrous sulfate 325 mg (65 mg iron ) capsule Take 325 mg by mouth at bedtime     fluticasone propionate (FLONASE) 50 mcg/actuation nasal spray Place 2 sprays into both nostrils once daily as needed     gabapentin  (NEURONTIN ) 300 MG capsule Take 300 mg by mouth 2 (two) times daily     insulin  GLARGINE (LANTUS ) injection (concentration 100 units/mL) Inject 52 Units subcutaneously at bedtime     JARDIANCE  25 mg  Tab tablet Take 25 mg by mouth once daily  4   lisinopril  (PRINIVIL ,ZESTRIL ) 40 MG tablet Take 40 mg by mouth every morning     metFORMIN  (GLUCOPHAGE ) 850 MG tablet Take 850 mg by mouth 2 (two) times daily with meals     MOUNJARO 5 mg/0.5 mL pen injector Inject 5 mg subcutaneously     NOVOLOG  FLEXPEN U-100 INSULIN  pen injector (concentration 100 units/mL) 3 (three) times daily with meals 15 units at breakfast, 25 units at lunch, and 35-40 units at evening  3   pantoprazole  (PROTONIX ) 40 MG DR tablet Take 40 mg by mouth at bedtime     rosuvastatin  (CRESTOR ) 10 MG tablet Take 10 mg by mouth every evening     sertraline  (ZOLOFT ) 25 MG tablet Take 25 mg by mouth at bedtime     valACYclovir (VALTREX) 1000 MG tablet Take 1 tablet by mouth as needed     zolpidem  (AMBIEN ) 10 mg tablet Take 10 mg by mouth at bedtime  1   No current facility-administered medications on file prior to visit.    Family History  Problem Relation Age of Onset   Diabetes Mother    High blood pressure (Hypertension) Father    Hyperlipidemia (Elevated cholesterol) Father    Obesity Brother    Diabetes Brother    Anesthesia problems Neg Hx    Malignant hyperthermia Neg Hx      Social History   Tobacco Use  Smoking Status Never  Smokeless Tobacco Never     Social History   Socioeconomic History   Marital status: Married  Tobacco Use   Smoking status: Never   Smokeless tobacco: Never  Vaping Use   Vaping status: Never Used  Substance and Sexual Activity   Alcohol use: Yes    Comment: Rarely   Drug use: Not Currently   Social Drivers of Health   Food Insecurity: No Food Insecurity (04/02/2023)   Received from [facility]   Hunger Vital Sign    Within the past 12 months, you worried that your food would run out before you got the money to buy more.: Never true    Within the past 12 months, the food you bought just didn't last and you didn't have money to get more.: Never true  Transportation Needs: No  Transportation Needs (04/02/2023)   Received from [facility] - [facility]    Lack of Transportation (Medical): No    Lack of Transportation (Non-Medical): No  Housing Stability: Unknown (01/14/2024)   Housing Stability Vital Sign    Homeless in the Last Year: No    Objective:    Vitals:   06/29/24 1405  BP: 122/82  Pulse: 88  Temp: 36.5 C (97.7 F)  SpO2: 98%  Weight: 95.3 kg (210 lb)  Height: 175.3 cm (5' 9)  PainSc: 0-No pain    Body mass index is 31.01 kg/m.  Physical Exam  Constitutional:  WDWN in NAD, conversant, no obvious deformities; lying in bed comfortably Eyes:  Pupils equal, round; sclera anicteric; moist conjunctiva; no lid lag HENT:  Oral mucosa moist; good dentition  Neck:  No masses palpated, trachea midline; no thyromegaly Lungs:  CTA bilaterally; normal respiratory effort Breasts: Asymmetric with the left chest breast tissue larger than the right.  No palpable masses on the right.  The nipple appears normal. The left chest shows noticeable asymmetry with more breast tissue on the left.  I cannot palpate any distinct retroareolar masses.  However the nipple is visibly retracted compared to the right.  The areola and nipple are very tender to palpation.  No erythema or induration noted.  No nipple discharge. CV:  Regular rate and rhythm; no murmurs; extremities well-perfused with no edema Abd:  +bowel sounds, soft, non-tender, no palpable organomegaly; long midline incision with a left sided colostomy.  The patient has generalized abdominal wall laxity with an apparent left parastomal hernia Musc: Normal gait; no apparent clubbing or cyanosis in extremities Lymphatic:  No palpable cervical or axillary lymphadenopathy Skin:  Warm, dry; no sign of jaundice Psychiatric - alert and oriented x 4; calm mood and affect   Labs, Imaging and Diagnostic Testing: CLINICAL DATA:  Left breast retroareolar swelling and pain for 2 months   EXAM: DIGITAL  DIAGNOSTIC BILATERAL MAMMOGRAM WITH TOMOSYNTHESIS AND CAD   TECHNIQUE: Bilateral digital diagnostic mammography and breast tomosynthesis was performed. The images were evaluated with computer-aided detection.   COMPARISON:  Previous exam(s).   ACR Breast Density Category b: There are scattered areas of fibroglandular density.   FINDINGS: Right: No suspicious mass, distortion, or microcalcifications are identified to suggest presence of malignancy.   Left: Retroareolar flame shaped tissue consistent with gynecomastia. No suspicious mass, distortion, or microcalcifications are identified to suggest presence of malignancy.   IMPRESSION: 1. Asymmetric left gynecomastia. 2. No evidence of right or left breast malignancy.   RECOMMENDATION: No further mammographic workup is recommended, unless the patient develops new symptoms.   Gynecomastia is common and can occur with changes in the testosterone :estrogen ratio. Potential causes of gynecomastia include numerous prescription medications, dietary supplements, anabolic steroids, and recreational drugs, particularly marijuana. Other causes include hormone secreting testicular or pituitary tumors. Gynecomastia can be related to other medical problems, such as kidney, thyroid, or liver disease. Gynecomastia often resolves on its own.   I have discussed the findings and recommendations with the patient. If applicable, a reminder letter will be sent to the patient regarding the next appointment.   BI-RADS CATEGORY  2: Benign.     Electronically Signed   By: Aliene Lloyd M.D.   On: 04/27/2024 15:41  Assessment and Plan:  Diagnoses and all orders for this visit:  Subareolar gynecomastia in male Comments: Worsening tenderness, nipple retraction    Due to the worsening tenderness and the visible nipple retraction, would recommend excision of this area for definitive diagnosis as well as therapeutic intervention.  Recommend left  subcutaneous mastectomy.  I explained to the patient that this would likely leave him with some asymmetry compared to the right side.  He is not concerned with the visible appearance.  His greatest concern is with the worsening tenderness.  The surgical procedure has been discussed with the patient.  Potential risks, benefits, alternative treatments, and expected outcomes have been explained.  All of the patient's questions at this time have been answered.  The likelihood of reaching the patient's treatment goal is good.  The  patient understands the proposed surgical procedure and wishes to proceed.  Marketia Stallsmith DEWAYNE LIMA, MD  06/29/2024 3:35 PM

## 2024-07-07 DIAGNOSIS — M791 Myalgia, unspecified site: Secondary | ICD-10-CM | POA: Diagnosis not present

## 2024-07-12 DIAGNOSIS — E1142 Type 2 diabetes mellitus with diabetic polyneuropathy: Secondary | ICD-10-CM | POA: Diagnosis not present

## 2024-07-12 DIAGNOSIS — E782 Mixed hyperlipidemia: Secondary | ICD-10-CM | POA: Diagnosis not present

## 2024-07-12 DIAGNOSIS — N181 Chronic kidney disease, stage 1: Secondary | ICD-10-CM | POA: Diagnosis not present

## 2024-07-12 DIAGNOSIS — Z85048 Personal history of other malignant neoplasm of rectum, rectosigmoid junction, and anus: Secondary | ICD-10-CM | POA: Diagnosis not present

## 2024-07-12 DIAGNOSIS — Z23 Encounter for immunization: Secondary | ICD-10-CM | POA: Diagnosis not present

## 2024-07-12 DIAGNOSIS — E1140 Type 2 diabetes mellitus with diabetic neuropathy, unspecified: Secondary | ICD-10-CM | POA: Diagnosis not present

## 2024-07-12 DIAGNOSIS — I10 Essential (primary) hypertension: Secondary | ICD-10-CM | POA: Diagnosis not present

## 2024-07-12 DIAGNOSIS — Z0000 Encounter for general adult medical examination without abnormal findings: Secondary | ICD-10-CM | POA: Diagnosis not present

## 2024-07-12 DIAGNOSIS — I700 Atherosclerosis of aorta: Secondary | ICD-10-CM | POA: Diagnosis not present

## 2024-07-12 DIAGNOSIS — N62 Hypertrophy of breast: Secondary | ICD-10-CM | POA: Diagnosis not present

## 2024-07-12 DIAGNOSIS — E1122 Type 2 diabetes mellitus with diabetic chronic kidney disease: Secondary | ICD-10-CM | POA: Diagnosis not present

## 2024-07-12 DIAGNOSIS — Z794 Long term (current) use of insulin: Secondary | ICD-10-CM | POA: Diagnosis not present

## 2024-07-12 DIAGNOSIS — Z933 Colostomy status: Secondary | ICD-10-CM | POA: Diagnosis not present

## 2024-07-12 DIAGNOSIS — I2510 Atherosclerotic heart disease of native coronary artery without angina pectoris: Secondary | ICD-10-CM | POA: Diagnosis not present

## 2024-07-14 DIAGNOSIS — M7601 Gluteal tendinitis, right hip: Secondary | ICD-10-CM | POA: Diagnosis not present

## 2024-07-14 DIAGNOSIS — M5431 Sciatica, right side: Secondary | ICD-10-CM | POA: Diagnosis not present

## 2024-07-22 DIAGNOSIS — Z794 Long term (current) use of insulin: Secondary | ICD-10-CM | POA: Diagnosis not present

## 2024-07-22 DIAGNOSIS — N181 Chronic kidney disease, stage 1: Secondary | ICD-10-CM | POA: Diagnosis not present

## 2024-07-22 DIAGNOSIS — M5431 Sciatica, right side: Secondary | ICD-10-CM | POA: Diagnosis not present

## 2024-07-22 DIAGNOSIS — E1122 Type 2 diabetes mellitus with diabetic chronic kidney disease: Secondary | ICD-10-CM | POA: Diagnosis not present

## 2024-07-22 DIAGNOSIS — M7601 Gluteal tendinitis, right hip: Secondary | ICD-10-CM | POA: Diagnosis not present

## 2024-07-22 DIAGNOSIS — E1142 Type 2 diabetes mellitus with diabetic polyneuropathy: Secondary | ICD-10-CM | POA: Diagnosis not present

## 2024-07-28 DIAGNOSIS — M5431 Sciatica, right side: Secondary | ICD-10-CM | POA: Diagnosis not present

## 2024-08-04 DIAGNOSIS — M7601 Gluteal tendinitis, right hip: Secondary | ICD-10-CM | POA: Diagnosis not present

## 2024-08-04 DIAGNOSIS — M5431 Sciatica, right side: Secondary | ICD-10-CM | POA: Diagnosis not present

## 2024-08-11 DIAGNOSIS — M5431 Sciatica, right side: Secondary | ICD-10-CM | POA: Diagnosis not present

## 2024-08-11 DIAGNOSIS — M7601 Gluteal tendinitis, right hip: Secondary | ICD-10-CM | POA: Diagnosis not present

## 2024-08-16 DIAGNOSIS — M5416 Radiculopathy, lumbar region: Secondary | ICD-10-CM | POA: Diagnosis not present

## 2024-08-22 DIAGNOSIS — H02889 Meibomian gland dysfunction of unspecified eye, unspecified eyelid: Secondary | ICD-10-CM | POA: Diagnosis not present

## 2024-08-22 DIAGNOSIS — H01009 Unspecified blepharitis unspecified eye, unspecified eyelid: Secondary | ICD-10-CM | POA: Diagnosis not present

## 2024-08-22 DIAGNOSIS — I10 Essential (primary) hypertension: Secondary | ICD-10-CM | POA: Diagnosis not present

## 2024-08-23 DIAGNOSIS — M7601 Gluteal tendinitis, right hip: Secondary | ICD-10-CM | POA: Diagnosis not present

## 2024-08-23 DIAGNOSIS — M5431 Sciatica, right side: Secondary | ICD-10-CM | POA: Diagnosis not present

## 2024-08-24 DIAGNOSIS — R5383 Other fatigue: Secondary | ICD-10-CM | POA: Diagnosis not present

## 2024-08-24 DIAGNOSIS — D509 Iron deficiency anemia, unspecified: Secondary | ICD-10-CM | POA: Diagnosis not present

## 2024-09-06 DIAGNOSIS — M5431 Sciatica, right side: Secondary | ICD-10-CM | POA: Diagnosis not present

## 2024-10-18 DIAGNOSIS — E1122 Type 2 diabetes mellitus with diabetic chronic kidney disease: Secondary | ICD-10-CM | POA: Diagnosis not present

## 2024-10-18 DIAGNOSIS — N181 Chronic kidney disease, stage 1: Secondary | ICD-10-CM | POA: Diagnosis not present

## 2024-10-18 DIAGNOSIS — Z794 Long term (current) use of insulin: Secondary | ICD-10-CM | POA: Diagnosis not present

## 2024-10-18 DIAGNOSIS — E1142 Type 2 diabetes mellitus with diabetic polyneuropathy: Secondary | ICD-10-CM | POA: Diagnosis not present

## 2024-10-25 ENCOUNTER — Telehealth: Payer: Self-pay

## 2024-10-25 DIAGNOSIS — D509 Iron deficiency anemia, unspecified: Secondary | ICD-10-CM

## 2024-10-25 NOTE — Telephone Encounter (Signed)
-----   Message from [facility] sent at 04/29/2024  1:53 PM EDT ----- Regarding: due for labs CBC and TIBC ferritin in early November

## 2024-10-25 NOTE — Telephone Encounter (Signed)
 Called and spoke to patient.  He will go for labs in the next week or so.  He also asked to schedule an appointment with Dr. Mennie for some Sx he has been having.  Scheduled with Colleen next available on 12-9.

## 2024-10-26 DIAGNOSIS — M25551 Pain in right hip: Secondary | ICD-10-CM | POA: Diagnosis not present

## 2024-12-06 ENCOUNTER — Encounter: Payer: Self-pay | Admitting: Nurse Practitioner

## 2024-12-06 ENCOUNTER — Ambulatory Visit: Admitting: Nurse Practitioner

## 2024-12-06 VITALS — BP 116/60 | HR 87 | Ht 70.0 in | Wt 206.1 lb

## 2024-12-06 DIAGNOSIS — K625 Hemorrhage of anus and rectum: Secondary | ICD-10-CM

## 2024-12-06 DIAGNOSIS — Z85048 Personal history of other malignant neoplasm of rectum, rectosigmoid junction, and anus: Secondary | ICD-10-CM

## 2024-12-06 NOTE — Progress Notes (Unsigned)
 12/06/2024 Phillip Tate [id number] [birth year]   Chief Complaint:  History of Present Illness: History of hyperlipidemia, atrial fibrillation, coronary artery disease, diabetes mellitus type 2, IDA and rectal cancer  For the past month He walks 1 mile daily  Blood  Rectal pain, sometimes      Colonoscopy 04/28/2024: - The examined portion of the ileum was normal. - One 3 mm polyp in the cecum, removed with a cold snare. Resected and retrieved. - One 3 mm polyp in the transverse colon, removed with a cold snare. Resected and retrieved. - Normal ostomy. - The examination was otherwise normal - 5 year recall colonoscopy  1. Surgical [P], colon, transverse and cecum :       - TUBULAR ADENOMA.       - NO HIGH GRADE DYSPLASIA OR MALIGNANCY.       - SESSILE SERRATED POLYP.       - NO DYSPLASIA OR MALIGNANCY.      Latest Ref Rng & Units 05/11/2024    1:15 PM 04/28/2024   12:33 PM 01/12/2024    2:55 PM  CBC  WBC 4.0 - 10.5 K/uL 8.9  6.3  7.0   Hemoglobin 13.0 - 17.0 g/dL 84.3  85.0  85.2 Repeated and verified X2.   Hematocrit 39.0 - 52.0 % 51.9  47.0  47.3 Repeated and verified X2.   Platelets 150 - 400 K/uL 285  238.0  281.0         Latest Ref Rng & Units 05/11/2024    1:15 PM 10/08/2023    4:20 PM 10/08/2023    4:05 PM  CMP  Glucose 70 - 99 mg/dL 857  814  815   BUN 8 - 23 mg/dL 18  20  18    Creatinine 0.61 - 1.24 mg/dL 9.20  9.19  9.16   Sodium 135 - 145 mmol/L 134  135  136   Potassium 3.5 - 5.1 mmol/L 4.8  4.8  4.9   Chloride 98 - 111 mmol/L 100  104  101   CO2 22 - 32 mmol/L 26   21   Calcium  8.9 - 10.3 mg/dL 9.5   9.1   Total Protein 6.5 - 8.1 g/dL   7.4   Total Bilirubin 0.3 - 1.2 mg/dL   0.7   Alkaline Phos 38 - 126 U/L   71   AST 15 - 41 U/L   21   ALT 0 - 44 U/L   20         Current Medications, Allergies, Past Medical History, Past Surgical History, Family History and Social History were reviewed in [employer]  record.   Review of Systems:   Constitutional: Negative for fever, sweats, chills or weight loss.  Respiratory: Negative for shortness of breath.   Cardiovascular: Negative for chest pain, palpitations and leg swelling.  Gastrointestinal: See HPI.  Musculoskeletal: Negative for back pain or muscle aches.  Neurological: Negative for dizziness, headaches or paresthesias.    Physical Exam: Ht 5' 10 (1.778 m)   Wt 206 lb 2 oz (93.5 kg)   BMI 29.58 kg/m  General: in no acute distress. Head: Normocephalic and atraumatic. Eyes: No scleral icterus. Conjunctiva pink . Ears: Normal auditory acuity. Mouth: Dentition intact. No ulcers or lesions.  Lungs: Clear throughout to auscultation. Heart: Regular rate and rhythm, no murmur. Abdomen: Soft, nontender and nondistended. No masses or hepatomegaly. Normal bowel sounds x 4 quadrants.  Rectal: *** Musculoskeletal: Symmetrical with  no gross deformities. Extremities: No edema. Neurological: Alert oriented x 4. No focal deficits.  Psychological: Alert and cooperative. Normal mood and affect  Assessment and Recommendations: ***

## 2024-12-06 NOTE — Patient Instructions (Addendum)
 Apply a small amount of Desitin inside the anal opening and to the external anal area twice daily for anal irritation/bleeding for one week then as needed  Contact our office if rectal bleeding or rectal pain persists or worsens   _______________________________________________________  If your blood pressure at your visit was 140/90 or greater, please contact your primary care physician to follow up on this.  _______________________________________________________  If you are age 14 or older, your body mass index should be between 23-30. Your Body mass index is 29.58 kg/m. If this is out of the aforementioned range listed, please consider follow up with your Primary Care Provider.  If you are age 15 or younger, your body mass index should be between 19-25. Your Body mass index is 29.58 kg/m. If this is out of the aformentioned range listed, please consider follow up with your Primary Care Provider.   ________________________________________________________  The [facility] providers would like to encourage you to use MYCHART to communicate with providers for non-urgent requests or questions.  Due to long hold times on the telephone, sending your provider a message by [facility] may be a faster and more efficient way to get a response.  Please allow 48 business hours for a response.  Please remember that this is for non-urgent requests.  _______________________________________________________  [facility] is using a team-based approach to care.  Your team is made up of your doctor and two to three APPS. Our APPS (Nurse Practitioners and Physician Assistants) work with your physician to ensure care continuity for you. They are fully qualified to address your health concerns and develop a treatment plan. They communicate directly with your gastroenterologist to care for you. Seeing the Advanced Practice Practitioners on your physician's team can help you by facilitating care more  promptly, often allowing for earlier appointments, access to diagnostic testing, procedures, and other specialty referrals.

## 2024-12-07 NOTE — Progress Notes (Signed)
 Agree with assessment with the following thoughts.  Elida do you know who his colorectal surgeon is? I need to clarify how much rectum / colon he has in place? I thought he had complete proctectomy? Unclear what is driving this. I do think he should see his colorectal surgeon. If he has residual colon / bowel that is in place and diverted due to colostomy, diversion colitis is possible, but I was under the impression he doesn't have any residual rectum. This needs to be clarified prior to any endoscopic attempt.

## 2024-12-15 NOTE — Progress Notes (Signed)
 Genna - reviewed records which are extensive. Intersphincteric proctectomy with colonic J-pouch to anal anastomosis and diverting loop ileostomy in 2006 for treatment of rectal cancer. The ileostomy was closed in 2007, and he had good function for the first one or two years after the operation. Gradually, he experienced increasing problems with fecal incontinence. That incontinence eventually became intractable, and on 12/03/2021 he underwent the hand assisted laparoscopic creation of a divided loop colostomy. The creation of a colostomy solved the fecal incontinence problem; however, the patient continued to struggle with bleeding and wetness in the anal area secondary to prolapse of the mucosa of the colonic J-pouch. These symptoms have severely decreased his quality of life. Examination in the office on 03/28/22, which included rigid lower endoscopy, revealed decreased resting sphincter tone and squeezing strength and the inflamed, redundant, prolapsing tissue  Looks like he then had complete excision of coloanal J pouch in 2022-2023 time frame? He has no remaining rectum, should not have anything able to drain there based on surgical report.  I think he may have a small blind pouch based on your exam, but should have any cuffitis if rectum has been completely removed.  No plans for endoscopic evaluation at this time.  I do think it a good idea that he become established with a surgeon in our area given his extensive surgical history, if he does not plan on following up with his prior surgeon, would be good for him to follow up locally. Thanks

## 2024-12-15 NOTE — Progress Notes (Signed)
 Dr. Leigh, his [facility] general surgeon was Agustin Deward LABOR, MD. See past colorectal surgeries in care everywhere. His is rectal exam was limited, rectum felt closed off, internal exam was limited to the extent of my exam finger then felt walled off.  I wasn't sure if he might have cuffitis if any rectal mucosa remaining. Let me know your input after you review his surgical records and I will send him to Dr. Belinda if that remains your plan. THX.

## 2024-12-30 NOTE — Progress Notes (Signed)
 Referral sent to [facility] for appt with Colorectal surgeon.

## 2024-12-30 NOTE — Progress Notes (Signed)
 Linda/Dottie:  I called the patient discussed Dr. Hassan recommendation regarding establishing a new colorectal surgeon if the patient does not wish to follow-up with his prior surgeon.  The patient stated he would like to establish a local colorectal surgeon.  Please enter colorectal surgery referral regarding rectal bleeding on a patient with a complex history including remote history of rectal cancer with ostomy. S/P intersphincteric proctectomy with colonic J-pouch and inverting loop ileostomy 10/2005, s/p loop ileostomy takedown 05/2006, hemorrhoidectomy 10/2018, repair of prolapsed J pouch 11/2018, rectal prolapse s/p proctoplasty 03/2022 and SBO and parastomal hernia s/p parastomal hernia repair with revision of ileoileostomy and lysis of adhesions 06/2023. THX.  Patient stated his rectal bleeding has significantly improved after using Desitin to the anorectal area.

## 2025-01-12 ENCOUNTER — Other Ambulatory Visit (HOSPITAL_COMMUNITY): Payer: Self-pay | Admitting: Internal Medicine

## 2025-01-19 ENCOUNTER — Telehealth: Payer: Self-pay

## 2025-01-19 ENCOUNTER — Encounter: Payer: Self-pay | Admitting: Internal Medicine

## 2025-01-19 NOTE — Telephone Encounter (Signed)
 Dr. Husain, patient will be scheduled as soon as possible.  Auth Submission: NO AUTH NEEDED Site of care: Site of care: [facility] Payer: [employer] medicare Medication & CPT/J Code(s) submitted: Venofer (Iron  Sucrose) J1756 Diagnosis Code:  Route of submission (phone, fax, portal):  Phone # Fax # Auth type: Buy/Bill PB Units/visits requested: 200mg  x 2 doses Reference number:  Approval from: 01/19/25 to 04/27/25

## 2025-01-26 ENCOUNTER — Ambulatory Visit

## 2025-01-26 VITALS — BP 120/73 | HR 95 | Temp 97.7°F | Resp 16 | Ht 69.0 in | Wt 208.6 lb

## 2025-01-26 DIAGNOSIS — D509 Iron deficiency anemia, unspecified: Secondary | ICD-10-CM | POA: Diagnosis not present

## 2025-01-26 MED ORDER — SODIUM CHLORIDE 0.9 % IV SOLN
200.0000 mg | Freq: Once | INTRAVENOUS | Status: AC
  Administered 2025-01-26: 200 mg via INTRAVENOUS
  Filled 2025-01-26: qty 10

## 2025-01-26 NOTE — Patient Instructions (Signed)

## 2025-01-26 NOTE — Progress Notes (Signed)
 Diagnosis: Iron  Deficiency Anemia  Provider:  Praveen Mannam MD  Procedure: IV Infusion  IV Type: Peripheral, IV Location: R Hand  Venofer  (Iron  Sucrose), Dose: 200 mg  Infusion Start Time: 1318  Infusion Stop Time: 1336  Post Infusion IV Care: Patient declined observation and Peripheral IV Discontinued  Discharge: Condition: Good, Destination: Home . AVS Declined  Performed by:  Boden Stucky, RN

## 2025-02-02 ENCOUNTER — Ambulatory Visit

## 2025-02-02 VITALS — BP 174/91 | HR 72 | Temp 97.8°F | Resp 18 | Ht 69.0 in | Wt 211.0 lb

## 2025-02-02 DIAGNOSIS — D509 Iron deficiency anemia, unspecified: Secondary | ICD-10-CM | POA: Diagnosis not present

## 2025-02-02 MED ORDER — SODIUM CHLORIDE 0.9 % IV SOLN
200.0000 mg | Freq: Once | INTRAVENOUS | Status: AC
  Administered 2025-02-02: 200 mg via INTRAVENOUS
  Filled 2025-02-02: qty 10

## 2025-02-02 NOTE — Progress Notes (Signed)
 Diagnosis: Iron  Deficiency Anemia  Provider:  Praveen Mannam MD  Procedure: IV Infusion  IV Type: Peripheral, IV Location: R Hand  Venofer  (Iron  Sucrose), Dose: 200 mg  Infusion Start Time: 1326  Infusion Stop Time: 1342  Post Infusion IV Care: Patient declined observation and Peripheral IV Discontinued  Discharge: Condition: Good, Destination: Home . AVS Declined  Performed by:  Zonya Gudger, RN
